# Patient Record
Sex: Female | Born: 1980 | Race: White | Hispanic: No | Marital: Married | Smoking: Current some day smoker
Health system: Southern US, Community
[De-identification: ages and names within clinical notes are randomized; demographics above are authoritative.]

## PROBLEM LIST (undated history)

## (undated) DIAGNOSIS — R1115 Cyclical vomiting syndrome unrelated to migraine: Secondary | ICD-10-CM

## (undated) DIAGNOSIS — K2270 Barrett's esophagus without dysplasia: Secondary | ICD-10-CM

## (undated) DIAGNOSIS — E669 Obesity, unspecified: Secondary | ICD-10-CM

## (undated) DIAGNOSIS — K3184 Gastroparesis: Secondary | ICD-10-CM

## (undated) DIAGNOSIS — E119 Type 2 diabetes mellitus without complications: Secondary | ICD-10-CM

## (undated) DIAGNOSIS — G43909 Migraine, unspecified, not intractable, without status migrainosus: Secondary | ICD-10-CM

## (undated) DIAGNOSIS — Z22322 Carrier or suspected carrier of Methicillin resistant Staphylococcus aureus: Secondary | ICD-10-CM

## (undated) HISTORY — PX: CARDIAC CATHETERIZATION: SHX172

---

## 2003-07-06 DIAGNOSIS — H6990 Unspecified Eustachian tube disorder, unspecified ear: Secondary | ICD-10-CM | POA: Insufficient documentation

## 2005-11-30 ENCOUNTER — Emergency Department: Payer: Self-pay | Admitting: Unknown Physician Specialty

## 2006-01-12 ENCOUNTER — Observation Stay: Payer: Self-pay

## 2006-05-09 ENCOUNTER — Observation Stay: Payer: Self-pay | Admitting: Obstetrics & Gynecology

## 2006-05-10 ENCOUNTER — Observation Stay: Payer: Self-pay | Admitting: Obstetrics & Gynecology

## 2006-07-17 ENCOUNTER — Other Ambulatory Visit: Payer: Self-pay

## 2006-07-17 ENCOUNTER — Emergency Department: Payer: Self-pay | Admitting: Emergency Medicine

## 2010-08-01 ENCOUNTER — Inpatient Hospital Stay: Payer: Self-pay | Admitting: Internal Medicine

## 2010-08-01 ENCOUNTER — Ambulatory Visit: Payer: Self-pay | Admitting: Family Medicine

## 2010-08-04 LAB — PATHOLOGY REPORT

## 2011-01-03 ENCOUNTER — Ambulatory Visit: Payer: Self-pay | Admitting: Internal Medicine

## 2011-01-09 ENCOUNTER — Ambulatory Visit: Payer: Self-pay | Admitting: Internal Medicine

## 2011-08-23 ENCOUNTER — Emergency Department: Payer: Self-pay | Admitting: Emergency Medicine

## 2011-08-26 ENCOUNTER — Emergency Department: Payer: Self-pay | Admitting: Internal Medicine

## 2014-03-20 ENCOUNTER — Ambulatory Visit: Payer: Self-pay | Admitting: Physician Assistant

## 2014-06-27 ENCOUNTER — Ambulatory Visit: Payer: Self-pay | Admitting: Physician Assistant

## 2014-07-04 ENCOUNTER — Ambulatory Visit: Payer: Self-pay | Admitting: Physician Assistant

## 2015-05-19 ENCOUNTER — Ambulatory Visit
Admission: EM | Admit: 2015-05-19 | Discharge: 2015-05-19 | Disposition: A | Discharge status: Home / Self Care | Payer: Medicaid Other | Attending: Emergency Medicine | Admitting: Emergency Medicine

## 2015-05-19 DIAGNOSIS — L0201 Cutaneous abscess of face: Secondary | ICD-10-CM | POA: Diagnosis not present

## 2015-05-19 DIAGNOSIS — L03211 Cellulitis of face: Secondary | ICD-10-CM

## 2015-05-19 HISTORY — DX: Carrier or suspected carrier of methicillin resistant Staphylococcus aureus: Z22.322

## 2015-05-19 HISTORY — DX: Barrett's esophagus without dysplasia: K22.70

## 2015-05-19 HISTORY — DX: Migraine, unspecified, not intractable, without status migrainosus: G43.909

## 2015-05-19 MED ORDER — TRAMADOL HCL 50 MG PO TABS: ORAL_TABLET | ORAL | Status: DC

## 2015-05-19 MED ORDER — SULFAMETHOXAZOLE-TRIMETHOPRIM 800-160 MG PO TABS: 2.0000 | ORAL_TABLET | Freq: Two times a day (BID) | ORAL | Status: DC

## 2015-05-19 NOTE — ED Notes (Signed)
Patient to nurses station c/o length of time waiting to be seen. States has to leave to take son to football. Left prior to being evaluated.

## 2015-05-19 NOTE — Discharge Instructions (Signed)
Follow-up with [facility] within the next day or 2. Warm compresses, Tylenol, tramadol as needed for severe pain. Go to the ER for trouble talking, if he cannot swallowing her saliva, difficulty breathing.

## 2015-05-19 NOTE — ED Notes (Signed)
States squeezed a pimple on chin yesterday and today + swelling and painful to touch

## 2015-05-19 NOTE — ED Provider Notes (Signed)
HPI  SUBJECTIVE:  Karen Ruiz is a 34 y.o. female who presents with a painful erythematous mass of gradually increasing size inferior to her right chin starting 4 days ago. Symptoms worse with palpation no alleviating factors. She tried hot compresses this morning. She states it started off as a "pimple". States that she has not been squeezing it. No  vomiting, fevers, drainage, voice changes, difficulty swallowing, dental pain. No drooling. No antipyretic in the past 4-6 hours. LMP 7/16, patient denies any possibility of being pregnant, has a Mirena. Past medical history of MRSA, Barrett's esophagus, migraines, Gestational diabetes, resolved. no history of hypertension.  Past Medical History  Diagnosis Date  . Barrett esophagus   . Migraines   . MRSA (methicillin resistant staph aureus) culture positive     2009    History reviewed. No pertinent past surgical history.  Family History  Problem Relation Age of Onset  . Diabetes Mother   . Cancer Mother   . Heart failure Mother   . Hypertension Mother   . Heart attack Father   . Diabetes Father   . Hypertension Father     Social History  Substance Use Topics  . Smoking status: Current Every Day Smoker -- 0.50 packs/day    Types: Cigarettes  . Smokeless tobacco: None  . Alcohol Use: Yes     Comment: socially    No current facility-administered medications for this encounter.  Current outpatient prescriptions:  .  ranitidine (ZANTAC) 150 MG capsule, Take 150 mg by mouth 2 (two) times daily., Disp: , Rfl:  .  sulfamethoxazole-trimethoprim (BACTRIM DS,SEPTRA DS) 800-160 MG per tablet, Take 2 tablets by mouth 2 (two) times daily., Disp: 40 tablet, Rfl: 0 .  traMADol (ULTRAM) 50 MG tablet, 1-2 tabs po q 6 hr prn pain Maximum dose= 8 tablets per day, Disp: 20 tablet, Rfl: 0  No Known Allergies   ROS  As noted in HPI.   Physical Exam  BP 155/74 mmHg  Pulse 76  Temp(Src) 98.4 F (36.9 C) (Oral)  Resp 18  Ht 5'  7" (1.702 m)  Wt 295 lb (133.811 kg)  BMI 46.19 kg/m2  SpO2 98%  LMP 04/16/2015 (Exact Date)  Constitutional: Well developed, well nourished, no acute distress Eyes:  EOMI, conjunctiva normal bilaterally HENT: Normocephalic, atraumatic,mucus membranes moist. Dentition normal. No gingival swelling, fluctuance. Voice normal. No trismus, drooling. Respiratory: Normal inspiratory effort Cardiovascular: Normal rate GI: nondistended skin: 3 x 4 cm tender, erythematous mass right submandibular area.  no appreciable fluctuance. Lymph: No other cervical lymphadenopathy. Musculoskeletal: no deformities Neurologic: Alert & oriented x 3, no focal neuro deficits Psychiatric: Speech and behavior appropriate   ED Course   Medications - No data to display  Orders Placed This Encounter  Procedures  . Ambulatory referral to General Surgery    Referral Priority:  Urgent    Referral Type:  Surgical    Referral Reason:  Specialty Services Required    Requested Specialty:  General Surgery    Number of Visits Requested:  1    No results found for this or any previous visit (from the past 24 hour(s)). No results found.  ED Clinical Impression  Abscess or cellulitis of face  ED Assessment/Plan  4 x 3 cm tender area of induration inferior to right chin there is no appreciable fluctuance. No evidence of airway compromise or ludwig's angina, parapharyngeal space abscess. appears to be an early abscess but due to location will not attempt I&D here in  the clinic. Bactrim, Tylenol, tramadol, warm compresses, referral to surgery in 1-2 days.  Discussed  MDM, plan and followup with patient. Discussed sn/sx that should prompt return to the UC or ED. Patient agrees with plan.   *This clinic note was created using Dragon dictation software. Therefore, there may be occasional mistakes despite careful proofreading.  ?   Domenick Gong, MD 05/19/15 2141

## 2015-05-20 ENCOUNTER — Telehealth: Payer: Self-pay | Admitting: Emergency Medicine

## 2015-05-20 NOTE — ED Notes (Signed)
Spoke to patient on the phone and patient states that she has an appointment with [facility] on 05/24/15.  Patient was instructed that if her symptoms worsen over the weekend to go to the ED for further treatment.  Patient verbalized understanding.

## 2015-05-23 ENCOUNTER — Telehealth: Payer: Self-pay

## 2015-05-23 NOTE — Telephone Encounter (Signed)
In preparing for tomorrow's clinic, see that patient is being seen for Paraesophageal Abscess. After speaking with our surgeon here today, patient needs to be seen by ENT.  Called patient and explained this situation. Pt understands. Offered to give local ENT's names and numbers but pt states that she will call back to Urgent Care to receive name and number of a close ENT.

## 2015-05-24 ENCOUNTER — Ambulatory Visit: Payer: Self-pay | Admitting: Surgery

## 2016-09-06 DIAGNOSIS — E1165 Type 2 diabetes mellitus with hyperglycemia: Secondary | ICD-10-CM | POA: Diagnosis present

## 2016-09-06 DIAGNOSIS — F17200 Nicotine dependence, unspecified, uncomplicated: Secondary | ICD-10-CM | POA: Insufficient documentation

## 2016-09-06 DIAGNOSIS — Z720 Tobacco use: Secondary | ICD-10-CM | POA: Insufficient documentation

## 2016-09-06 DIAGNOSIS — IMO0002 Reserved for concepts with insufficient information to code with codable children: Secondary | ICD-10-CM | POA: Insufficient documentation

## 2016-09-06 DIAGNOSIS — E119 Type 2 diabetes mellitus without complications: Secondary | ICD-10-CM

## 2016-09-06 HISTORY — DX: Type 2 diabetes mellitus without complications: E11.9

## 2019-03-05 DIAGNOSIS — L300 Nummular dermatitis: Secondary | ICD-10-CM | POA: Insufficient documentation

## 2019-03-05 DIAGNOSIS — R609 Edema, unspecified: Secondary | ICD-10-CM | POA: Insufficient documentation

## 2019-07-15 ENCOUNTER — Encounter: Payer: Self-pay | Admitting: Emergency Medicine

## 2019-07-15 ENCOUNTER — Emergency Department
Admission: EM | Admit: 2019-07-15 | Discharge: 2019-07-15 | Disposition: A | Discharge status: Home / Self Care | Payer: Medicaid Other | Source: Home / Self Care | Attending: Emergency Medicine | Admitting: Emergency Medicine

## 2019-07-15 ENCOUNTER — Emergency Department: Payer: Medicaid Other

## 2019-07-15 ENCOUNTER — Other Ambulatory Visit: Payer: Self-pay

## 2019-07-15 ENCOUNTER — Observation Stay
Admission: EM | Admit: 2019-07-15 | Discharge: 2019-07-16 | Disposition: A | Discharge status: Home / Self Care | Payer: Medicaid Other | Attending: Internal Medicine | Admitting: Internal Medicine

## 2019-07-15 DIAGNOSIS — K760 Fatty (change of) liver, not elsewhere classified: Secondary | ICD-10-CM | POA: Diagnosis not present

## 2019-07-15 DIAGNOSIS — Z794 Long term (current) use of insulin: Secondary | ICD-10-CM | POA: Diagnosis not present

## 2019-07-15 DIAGNOSIS — Z20828 Contact with and (suspected) exposure to other viral communicable diseases: Secondary | ICD-10-CM | POA: Diagnosis not present

## 2019-07-15 DIAGNOSIS — F17210 Nicotine dependence, cigarettes, uncomplicated: Secondary | ICD-10-CM | POA: Insufficient documentation

## 2019-07-15 DIAGNOSIS — Z8614 Personal history of Methicillin resistant Staphylococcus aureus infection: Secondary | ICD-10-CM | POA: Diagnosis not present

## 2019-07-15 DIAGNOSIS — M5127 Other intervertebral disc displacement, lumbosacral region: Secondary | ICD-10-CM | POA: Diagnosis not present

## 2019-07-15 DIAGNOSIS — K429 Umbilical hernia without obstruction or gangrene: Secondary | ICD-10-CM | POA: Insufficient documentation

## 2019-07-15 DIAGNOSIS — K2270 Barrett's esophagus without dysplasia: Secondary | ICD-10-CM | POA: Insufficient documentation

## 2019-07-15 DIAGNOSIS — Z8719 Personal history of other diseases of the digestive system: Secondary | ICD-10-CM | POA: Diagnosis not present

## 2019-07-15 DIAGNOSIS — Z79899 Other long term (current) drug therapy: Secondary | ICD-10-CM | POA: Diagnosis not present

## 2019-07-15 DIAGNOSIS — G43909 Migraine, unspecified, not intractable, without status migrainosus: Secondary | ICD-10-CM | POA: Insufficient documentation

## 2019-07-15 DIAGNOSIS — R1013 Epigastric pain: Secondary | ICD-10-CM | POA: Insufficient documentation

## 2019-07-15 DIAGNOSIS — I10 Essential (primary) hypertension: Secondary | ICD-10-CM | POA: Insufficient documentation

## 2019-07-15 DIAGNOSIS — E119 Type 2 diabetes mellitus without complications: Secondary | ICD-10-CM | POA: Diagnosis not present

## 2019-07-15 DIAGNOSIS — Z23 Encounter for immunization: Secondary | ICD-10-CM | POA: Diagnosis not present

## 2019-07-15 DIAGNOSIS — R112 Nausea with vomiting, unspecified: Principal | ICD-10-CM | POA: Diagnosis present

## 2019-07-15 DIAGNOSIS — R160 Hepatomegaly, not elsewhere classified: Secondary | ICD-10-CM | POA: Diagnosis not present

## 2019-07-15 DIAGNOSIS — K219 Gastro-esophageal reflux disease without esophagitis: Secondary | ICD-10-CM | POA: Diagnosis not present

## 2019-07-15 LAB — COMPREHENSIVE METABOLIC PANEL
ALT: 51 U/L — ABNORMAL HIGH (ref 0–44)
AST: 40 U/L (ref 15–41)
Albumin: 4 g/dL (ref 3.5–5.0)
Alkaline Phosphatase: 66 U/L (ref 38–126)
Anion gap: 12 (ref 5–15)
BUN: 15 mg/dL (ref 6–20)
CO2: 19 mmol/L — ABNORMAL LOW (ref 22–32)
Calcium: 9 mg/dL (ref 8.9–10.3)
Chloride: 104 mmol/L (ref 98–111)
Creatinine, Ser: 0.54 mg/dL (ref 0.44–1.00)
GFR calc Af Amer: >60 mL/min (ref >60)
GFR calc non Af Amer: >60 mL/min (ref >60)
Glucose, Bld: 332 mg/dL — ABNORMAL HIGH (ref 70–99)
Potassium: 4.1 mmol/L (ref 3.5–5.1)
Sodium: 135 mmol/L (ref 135–145)
Total Bilirubin: 0.6 mg/dL (ref 0.3–1.2)
Total Protein: 7.5 g/dL (ref 6.5–8.1)

## 2019-07-15 LAB — URINALYSIS, COMPLETE (UACMP) WITH MICROSCOPIC
Bacteria, UA: NONE SEEN
Bilirubin Urine: NEGATIVE
Glucose, UA: >=500 mg/dL — AB
Hgb urine dipstick: NEGATIVE
Ketones, ur: 5 mg/dL — AB
Leukocytes,Ua: NEGATIVE
Nitrite: NEGATIVE
Protein, ur: NEGATIVE mg/dL
Specific Gravity, Urine: 1.027 (ref 1.005–1.030)
pH: 7 (ref 5.0–8.0)

## 2019-07-15 LAB — CBC
HCT: 45.9 % (ref 36.0–46.0)
Hemoglobin: 15.7 g/dL — ABNORMAL HIGH (ref 12.0–15.0)
MCH: 29.6 pg (ref 26.0–34.0)
MCHC: 34.2 g/dL (ref 30.0–36.0)
MCV: 86.6 fL (ref 80.0–100.0)
Platelets: 360 10*3/uL (ref 150–400)
RBC: 5.3 MIL/uL — ABNORMAL HIGH (ref 3.87–5.11)
RDW: 13.8 % (ref 11.5–15.5)
WBC: 11.5 10*3/uL — ABNORMAL HIGH (ref 4.0–10.5)
nRBC: 0 % (ref 0.0–0.2)

## 2019-07-15 LAB — GLUCOSE, CAPILLARY: Glucose-Capillary: 221 mg/dL — ABNORMAL HIGH (ref 70–99)

## 2019-07-15 LAB — LIPASE, BLOOD: Lipase: 22 U/L (ref 11–51)

## 2019-07-15 LAB — HEMOGLOBIN A1C
Hgb A1c MFr Bld: 10 % — ABNORMAL HIGH (ref 4.8–5.6)
Mean Plasma Glucose: 240.3 mg/dL

## 2019-07-15 LAB — POCT PREGNANCY, URINE: Preg Test, Ur: NEGATIVE

## 2019-07-15 MED ORDER — ONDANSETRON HCL 4 MG/2ML IJ SOLN
4.0000 mg | Freq: Once | INTRAMUSCULAR | Status: AC
  Administered 2019-07-15: 11:00:00 4 mg via INTRAVENOUS
  Filled 2019-07-15: qty 2

## 2019-07-15 MED ORDER — METOCLOPRAMIDE HCL 5 MG/ML IJ SOLN
10.0000 mg | Freq: Once | INTRAMUSCULAR | Status: AC
  Administered 2019-07-15: 10 mg via INTRAVENOUS
  Filled 2019-07-15: qty 2

## 2019-07-15 MED ORDER — SODIUM CHLORIDE 0.9% FLUSH: 3.0000 mL | Freq: Once | INTRAVENOUS | Status: DC

## 2019-07-15 MED ORDER — PROMETHAZINE HCL 25 MG PO TABS: 12.5000 mg | ORAL_TABLET | Freq: Four times a day (QID) | ORAL | Status: DC | PRN

## 2019-07-15 MED ORDER — INSULIN ASPART 100 UNIT/ML ~~LOC~~ SOLN
0.0000 [IU] | Freq: Three times a day (TID) | SUBCUTANEOUS | Status: DC
  Administered 2019-07-16: 2 [IU] via SUBCUTANEOUS
  Administered 2019-07-16: 3 [IU] via SUBCUTANEOUS
  Filled 2019-07-15 (×2): qty 1

## 2019-07-15 MED ORDER — PROMETHAZINE HCL 25 MG/ML IJ SOLN: 12.5000 mg | Freq: Four times a day (QID) | INTRAMUSCULAR | Status: DC | PRN

## 2019-07-15 MED ORDER — ONDANSETRON HCL 4 MG/2ML IJ SOLN: 4.0000 mg | Freq: Four times a day (QID) | INTRAMUSCULAR | Status: DC | PRN

## 2019-07-15 MED ORDER — SENNOSIDES-DOCUSATE SODIUM 8.6-50 MG PO TABS: 1.0000 | ORAL_TABLET | Freq: Every evening | ORAL | Status: DC | PRN

## 2019-07-15 MED ORDER — ACETAMINOPHEN 650 MG RE SUPP: 650.0000 mg | Freq: Four times a day (QID) | RECTAL | Status: DC | PRN

## 2019-07-15 MED ORDER — MORPHINE SULFATE (PF) 4 MG/ML IV SOLN: 4.0000 mg | INTRAVENOUS | Status: DC | PRN

## 2019-07-15 MED ORDER — ONDANSETRON HCL 4 MG PO TABS: 4.0000 mg | ORAL_TABLET | Freq: Four times a day (QID) | ORAL | Status: DC | PRN

## 2019-07-15 MED ORDER — METOPROLOL TARTRATE 5 MG/5ML IV SOLN: 2.5000 mg | INTRAVENOUS | Status: DC | PRN

## 2019-07-15 MED ORDER — PROMETHAZINE HCL 25 MG/ML IJ SOLN
12.5000 mg | Freq: Once | INTRAMUSCULAR | Status: AC
  Administered 2019-07-15: 14:00:00 12.5 mg via INTRAVENOUS
  Filled 2019-07-15: qty 1

## 2019-07-15 MED ORDER — IOHEXOL 300 MG/ML  SOLN
100.0000 mL | Freq: Once | INTRAMUSCULAR | Status: AC | PRN
  Administered 2019-07-15: 13:00:00 100 mL via INTRAVENOUS

## 2019-07-15 MED ORDER — SODIUM CHLORIDE 0.9 % IV SOLN: Freq: Once | INTRAVENOUS | Status: DC

## 2019-07-15 MED ORDER — INSULIN ASPART 100 UNIT/ML ~~LOC~~ SOLN
0.0000 [IU] | Freq: Every day | SUBCUTANEOUS | Status: DC
  Administered 2019-07-15: 2 [IU] via SUBCUTANEOUS
  Filled 2019-07-15: qty 1

## 2019-07-15 MED ORDER — DROPERIDOL 2.5 MG/ML IJ SOLN
2.5000 mg | Freq: Once | INTRAMUSCULAR | Status: AC
  Administered 2019-07-15: 12:00:00 2.5 mg via INTRAVENOUS
  Filled 2019-07-15: qty 2

## 2019-07-15 MED ORDER — ACETAMINOPHEN 325 MG PO TABS
650.0000 mg | ORAL_TABLET | Freq: Four times a day (QID) | ORAL | Status: DC | PRN
  Administered 2019-07-16 (×2): 650 mg via ORAL
  Filled 2019-07-15 (×2): qty 2

## 2019-07-15 MED ORDER — FENTANYL CITRATE (PF) 100 MCG/2ML IJ SOLN
50.0000 ug | INTRAMUSCULAR | Status: AC | PRN
  Administered 2019-07-15 (×2): 50 ug via INTRAVENOUS
  Filled 2019-07-15 (×2): qty 2

## 2019-07-15 MED ORDER — NICOTINE 14 MG/24HR TD PT24
14.0000 mg | MEDICATED_PATCH | Freq: Every day | TRANSDERMAL | Status: DC
  Filled 2019-07-15: qty 1

## 2019-07-15 MED ORDER — HYDROCODONE-ACETAMINOPHEN 5-325 MG PO TABS: 1.0000 | ORAL_TABLET | ORAL | Status: DC | PRN

## 2019-07-15 MED ORDER — HYDROMORPHONE HCL 1 MG/ML IJ SOLN
1.0000 mg | Freq: Once | INTRAMUSCULAR | Status: AC
  Administered 2019-07-15: 14:00:00 1 mg via INTRAVENOUS
  Filled 2019-07-15: qty 1

## 2019-07-15 MED ORDER — SODIUM CHLORIDE 0.9 % IV BOLUS
1000.0000 mL | Freq: Once | INTRAVENOUS | Status: AC
  Administered 2019-07-15: 11:00:00 1000 mL via INTRAVENOUS

## 2019-07-15 MED ORDER — INFLUENZA VAC SPLIT QUAD 0.5 ML IM SUSY
0.5000 mL | PREFILLED_SYRINGE | INTRAMUSCULAR | Status: AC
  Administered 2019-07-16: 0.5 mL via INTRAMUSCULAR
  Filled 2019-07-15: qty 0.5

## 2019-07-15 MED ORDER — LACTATED RINGERS IV BOLUS: 1000.0000 mL | Freq: Once | INTRAVENOUS | Status: DC

## 2019-07-15 MED ORDER — PROMETHAZINE HCL 12.5 MG PO TABS: 12.5000 mg | ORAL_TABLET | Freq: Four times a day (QID) | ORAL | 0 refills | Status: DC | PRN

## 2019-07-15 MED ORDER — AMLODIPINE BESYLATE 5 MG PO TABS
5.0000 mg | ORAL_TABLET | Freq: Every day | ORAL | Status: DC
  Administered 2019-07-15 – 2019-07-16 (×2): 5 mg via ORAL
  Filled 2019-07-15 (×2): qty 1

## 2019-07-15 MED ORDER — SODIUM CHLORIDE 0.9 % IV SOLN
INTRAVENOUS | Status: DC
  Administered 2019-07-15: 22:00:00 via INTRAVENOUS

## 2019-07-15 MED ORDER — ENOXAPARIN SODIUM 40 MG/0.4ML ~~LOC~~ SOLN
40.0000 mg | SUBCUTANEOUS | Status: DC
  Administered 2019-07-15: 40 mg via SUBCUTANEOUS
  Filled 2019-07-15: qty 0.4

## 2019-07-15 MED ORDER — ALBUTEROL SULFATE (2.5 MG/3ML) 0.083% IN NEBU: 2.5000 mg | INHALATION_SOLUTION | RESPIRATORY_TRACT | Status: DC | PRN

## 2019-07-15 NOTE — ED Notes (Addendum)
Pt offered Zofran, states it does not work for her.  Pt states that she gets phenergan and dilaudid.

## 2019-07-15 NOTE — H&P (Signed)
[facility] [place] [facility]   PATIENT NAME: Karen Ruiz    MR#:  [id number]  DATE OF BIRTH:  05/17/1981  DATE OF ADMISSION:  07/15/2019  PRIMARY CARE PHYSICIAN: Marina Goodell, MD   REQUESTING/REFERRING PHYSICIAN: Dr. Roxan Hockey.  CHIEF COMPLAINT:   Chief Complaint  Patient presents with  . Emesis  . Abdominal Pain   Intractable abdominal pain, nausea and vomiting today. HISTORY OF PRESENT ILLNESS:  Karen Ruiz  is a 38 y.o. female with a known history of hypertension, diabetes type 2 and morbid obesity.  The patient has had a nausea and vomiting for the past 3 to 4 weeks.  She started to have abdominal pain, nausea and vomiting today.  She came to the ED and was given Phenergan prescription and sent home.  She came back due to intractable abdominal pain associated with nausea and vomiting.  Abdominal pain is a cramp, intermittent 7 out of 10 without radiation.  She denies any other symptoms.  CT of abdomen and pelvis is unremarkable.  Dr. Roxan Hockey requested admission. PAST MEDICAL HISTORY:   Past Medical History:  Diagnosis Date  . Barrett esophagus   . Migraines   . MRSA (methicillin resistant staph aureus) culture positive    2009    PAST SURGICAL HISTORY:  History reviewed. No pertinent surgical history.  SOCIAL HISTORY:   Social History   Tobacco Use  . Smoking status: Current Every Day Smoker    Packs/day: 0.50    Types: Cigarettes  . Smokeless tobacco: Never Used  Substance Use Topics  . Alcohol use: Yes    Comment: socially    FAMILY HISTORY:   Family History  Problem Relation Age of Onset  . Diabetes Mother   . Cancer Mother   . Heart failure Mother   . Hypertension Mother   . Heart attack Father   . Diabetes Father   . Hypertension Father     DRUG ALLERGIES:  No Known Allergies  REVIEW OF SYSTEMS:   Review of Systems  Constitutional: Negative for chills, fever and malaise/fatigue.  HENT:  Negative for sore throat.   Eyes: Negative for blurred vision and double vision.  Respiratory: Negative for cough, hemoptysis, shortness of breath, wheezing and stridor.   Cardiovascular: Negative for chest pain, palpitations, orthopnea and leg swelling.  Gastrointestinal: Positive for abdominal pain, nausea and vomiting. Negative for blood in stool, diarrhea and melena.  Genitourinary: Negative for dysuria, flank pain and hematuria.  Musculoskeletal: Negative for back pain and joint pain.  Skin: Negative for rash.  Neurological: Negative for dizziness, sensory change, focal weakness, seizures, loss of consciousness, weakness and headaches.  Endo/Heme/Allergies: Negative for polydipsia.  Psychiatric/Behavioral: Negative for depression. The patient is not nervous/anxious.     MEDICATIONS AT HOME:   Prior to Admission medications   Medication Sig Start Date End Date Taking? Authorizing Provider  promethazine (PHENERGAN) 12.5 MG tablet Take 1 tablet (12.5 mg total) by mouth every 6 (six) hours as needed for nausea or vomiting. 07/15/19   Chesley Noon, MD      VITAL SIGNS:  Blood pressure (!) 173/86, pulse 74, temperature 98.2 F (36.8 C), temperature source Oral, resp. rate 20, height 5\' 7"  (1.702 m), weight 113.4 kg, last menstrual period 07/14/2019, SpO2 97 %.  PHYSICAL EXAMINATION:  Physical Exam  GENERAL:  38 y.o.-year-old patient lying in the bed with no acute distress.  Morbid obesity. EYES: Pupils equal, round, reactive to light and accommodation. No scleral icterus.  Extraocular muscles intact.  HEENT: Head atraumatic, normocephalic. NECK:  Supple, no jugular venous distention. No thyroid enlargement, no tenderness.  LUNGS: Normal breath sounds bilaterally, no wheezing, rales,rhonchi or crepitation. No use of accessory muscles of respiration.  CARDIOVASCULAR: S1, S2 normal. No murmurs, rubs, or gallops.  ABDOMEN: Soft, tenderness in epigastric area, nondistended. Bowel sounds  present. No organomegaly or mass.  EXTREMITIES: No pedal edema, cyanosis, or clubbing.  NEUROLOGIC: Cranial nerves II through XII are intact. Muscle strength 5/5 in all extremities. Sensation intact. Gait not checked.  PSYCHIATRIC: The patient is alert and oriented x 3.  SKIN: No obvious rash, lesion, or ulcer.   LABORATORY PANEL:   CBC Recent Labs  Lab 07/15/19 1042  WBC 11.5*  HGB 15.7*  HCT 45.9  PLT 360   ------------------------------------------------------------------------------------------------------------------  Chemistries  Recent Labs  Lab 07/15/19 1106  NA 135  K 4.1  CL 104  CO2 19*  GLUCOSE 332*  BUN 15  CREATININE 0.54  CALCIUM 9.0  AST 40  ALT 51*  ALKPHOS 66  BILITOT 0.6   ------------------------------------------------------------------------------------------------------------------  Cardiac Enzymes No results for input(s): TROPONINI in the last 168 hours. ------------------------------------------------------------------------------------------------------------------  RADIOLOGY:  Ct Abdomen Pelvis W Contrast  Result Date: 07/15/2019 CLINICAL DATA:  Abdominal pain and vomiting. EXAM: CT ABDOMEN AND PELVIS WITH CONTRAST TECHNIQUE: Multidetector CT imaging of the abdomen and pelvis was performed using the standard protocol following bolus administration of intravenous contrast. CONTRAST:  100mL OMNIPAQUE IOHEXOL 300 MG/ML  SOLN COMPARISON:  CT abdomen pelvis dated August 01, 2010. FINDINGS: Lower chest: No acute abnormality. Unchanged small pericardial cyst at the right cardiophrenic angle. Hepatobiliary: Unchanged diffuse hepatic steatosis and mild hepatomegaly. No focal liver abnormality. The gallbladder is unremarkable. No biliary dilatation. Pancreas: Unremarkable. No pancreatic ductal dilatation or surrounding inflammatory changes. Spleen: Normal in size without focal abnormality. Adrenals/Urinary Tract: The adrenal glands are unremarkable.  Tiny subcentimeter low-density lesion in the upper pole of the right kidney is too small to characterize. No renal calculi or hydronephrosis. The bladder is unremarkable. Stomach/Bowel: Stomach is within normal limits. Appendix appears normal. No evidence of bowel wall thickening, distention, or inflammatory changes. Vascular/Lymphatic: No significant vascular findings are present. No enlarged abdominal or pelvic lymph nodes. Reproductive: Uterus and bilateral adnexa are unremarkable. Tampon in the vagina. Other: Small fat containing umbilical hernia. No free fluid or pneumoperitoneum. Musculoskeletal: No acute or significant osseous findings. Lumbarization of S1. Severe disc height loss at L5-S1 with large partially calcified disc protrusion. IMPRESSION: 1.  No acute intra-abdominal process. 2. Unchanged mild hepatomegaly with steatosis. Electronically Signed   By: Obie DredgeWilliam T Derry M.D.   On: 07/15/2019 13:46      IMPRESSION AND PLAN:   Intractable abdominal pain, nausea, vomiting, possible due to gastroparesis or acute gastritis. The patient will be placed for observation. N.p.o. except medication, Zofran as needed, IV fluid support.  Hyperglycemia and diabetes.  Start sliding scale. Hypertension.  Start Norvasc, Lopressor IV PRN. Morbid obesity.  Diet control is advised, follow-up PCP. Tobacco abuse.  Smoking cessation was counseled for 3 to 4 minutes, nicotine patch.  All the records are reviewed and case discussed with ED provider. Management plans discussed with the patient, family and they are in agreement.  CODE STATUS: Full code.  TOTAL TIME TAKING CARE OF THIS PATIENT: 45 minutes.    Shaune PollackQing Lee Kuang M.D on 07/15/2019 at 7:45 PM  Between 7am to 6pm - Pager - [phone]  After 6pm go to [email or web address] - password [email or web address]  [facility]  Office  [phone]  CC: Primary care physician; Sofie Hartigan, MD   Note: This dictation was prepared with  Dragon dictation along with smaller phrase technology. Any transcriptional errors that result from this process are unin

## 2019-07-15 NOTE — ED Notes (Signed)
Pt ambulatory/steady to room.

## 2019-07-15 NOTE — ED Provider Notes (Signed)
[facility] Emergency Department Provider Note    First MD Initiated Contact with Patient 07/15/19 1936     (approximate)  I have reviewed the triage vital signs and the nursing notes.   HISTORY  Chief Complaint Emesis and Abdominal Pain    HPI Karen Ruiz is a 38 y.o. female seen here earlier today for nausea vomiting presents the ER several hours after being discharged with recurrent abdominal pain nausea vomiting.  Was unable to get her medications filled.  No change in her symptoms.  Unable to keep any fluids down.   Past Medical History:  Diagnosis Date  . Barrett esophagus   . Migraines   . MRSA (methicillin resistant staph aureus) culture positive    2009   Family History  Problem Relation Age of Onset  . Diabetes Mother   . Cancer Mother   . Heart failure Mother   . Hypertension Mother   . Heart attack Father   . Diabetes Father   . Hypertension Father    History reviewed. No pertinent surgical history. There are no active problems to display for this patient.     Prior to Admission medications   Medication Sig Start Date End Date Taking? Authorizing Provider  promethazine (PHENERGAN) 12.5 MG tablet Take 1 tablet (12.5 mg total) by mouth every 6 (six) hours as needed for nausea or vomiting. 07/15/19   Chesley Noon, MD    Allergies Patient has no known allergies.    Social History Social History   Tobacco Use  . Smoking status: Current Every Day Smoker    Packs/day: 0.50    Types: Cigarettes  . Smokeless tobacco: Never Used  Substance Use Topics  . Alcohol use: Yes    Comment: socially  . Drug use: Not on file    Review of Systems Patient denies headaches, rhinorrhea, blurry vision, numbness, shortness of breath, chest pain, edema, cough, abdominal pain, nausea, vomiting, diarrhea, dysuria, fevers, rashes or hallucinations unless otherwise stated above in HPI. ____________________________________________    PHYSICAL EXAM:  VITAL SIGNS: Vitals:   07/15/19 1810  BP: (!) 173/86  Pulse: 74  Resp: 20  Temp: 98.2 F (36.8 C)  SpO2: 97%    Constitutional: Alert and oriented. Well appearing and in no acute distress. Eyes: Conjunctivae are normal.  Head: Atraumatic. Nose: No congestion/rhinnorhea. Mouth/Throat: Mucous membranes are moist.   Neck: Painless ROM.  Cardiovascular:   Good peripheral circulation. Respiratory: Normal respiratory effort.  No retractions.  Gastrointestinal: obese, soft and nontender.  Musculoskeletal: No lower extremity tenderness .  No joint effusions. Neurologic:  Normal speech and language. No gross focal neurologic deficits are appreciated.  Skin:  Skin is warm, dry and intact. No rash noted. Psychiatric: Mood and affect are normal. Speech and behavior are normal.  ____________________________________________   LABS (all labs ordered are listed, but only abnormal results are displayed)  Results for orders placed or performed during the hospital encounter of 07/15/19 (from the past 24 hour(s))  CBC     Status: Abnormal   Collection Time: 07/15/19 10:42 AM  Result Value Ref Range   WBC 11.5 (H) 4.0 - 10.5 K/uL   RBC 5.30 (H) 3.87 - 5.11 MIL/uL   Hemoglobin 15.7 (H) 12.0 - 15.0 g/dL   HCT 29.7 98.9 - 21.1 %   MCV 86.6 80.0 - 100.0 fL   MCH 29.6 26.0 - 34.0 pg   MCHC 34.2 30.0 - 36.0 g/dL   RDW 94.1 74.0 - 81.4 %  Platelets 360 150 - 400 K/uL   nRBC 0.0 0.0 - 0.2 %  Urinalysis, Complete w Microscopic     Status: Abnormal   Collection Time: 07/15/19 11:06 AM  Result Value Ref Range   Color, Urine YELLOW (A) YELLOW   APPearance CLEAR (A) CLEAR   Specific Gravity, Urine 1.027 1.005 - 1.030   pH 7.0 5.0 - 8.0   Glucose, UA >=500 (A) NEGATIVE mg/dL   Hgb urine dipstick NEGATIVE NEGATIVE   Bilirubin Urine NEGATIVE NEGATIVE   Ketones, ur 5 (A) NEGATIVE mg/dL   Protein, ur NEGATIVE NEGATIVE mg/dL   Nitrite NEGATIVE NEGATIVE   Leukocytes,Ua  NEGATIVE NEGATIVE   WBC, UA 0-5 0 - 5 WBC/hpf   Bacteria, UA NONE SEEN NONE SEEN   Squamous Epithelial / LPF 0-5 0 - 5   Mucus PRESENT   Comprehensive metabolic panel     Status: Abnormal   Collection Time: 07/15/19 11:06 AM  Result Value Ref Range   Sodium 135 135 - 145 mmol/L   Potassium 4.1 3.5 - 5.1 mmol/L   Chloride 104 98 - 111 mmol/L   CO2 19 (L) 22 - 32 mmol/L   Glucose, Bld 332 (H) 70 - 99 mg/dL   BUN 15 6 - 20 mg/dL   Creatinine, Ser 0.54 0.44 - 1.00 mg/dL   Calcium 9.0 8.9 - 10.3 mg/dL   Total Protein 7.5 6.5 - 8.1 g/dL   Albumin 4.0 3.5 - 5.0 g/dL   AST 40 15 - 41 U/L   ALT 51 (H) 0 - 44 U/L   Alkaline Phosphatase 66 38 - 126 U/L   Total Bilirubin 0.6 0.3 - 1.2 mg/dL   GFR calc non Af Amer >60 >60 mL/min   GFR calc Af Amer >60 >60 mL/min   Anion gap 12 5 - 15  Lipase, blood     Status: None   Collection Time: 07/15/19 11:06 AM  Result Value Ref Range   Lipase 22 11 - 51 U/L  Pregnancy, urine POC     Status: None   Collection Time: 07/15/19 11:15 AM  Result Value Ref Range   Preg Test, Ur NEGATIVE NEGATIVE   ____________________________________________ ________________________________  RADIOLOGY   ____________________________________________   PROCEDURES  Procedure(s) performed:  Procedures    Critical Care performed: no ____________________________________________   INITIAL IMPRESSION / ASSESSMENT AND PLAN / ED COURSE  Pertinent labs & imaging results that were available during my care of the patient were reviewed by me and considered in my medical decision making (see chart for details).  DDX: gastroparesis, dehydration, cyclic vomiting, enteritis  Karen Ruiz is a 38 y.o. who presents to the ED with symptoms as described above. Given concern for gastroparesis with failing outpatient trial now with intractable vomiting will discuss with hospitalist for admission for IV fluids symptomatic management.  Do not feel repeat imaging  clinically indicated       ____________________________________________   FINAL CLINICAL IMPRESSION(S) / ED DIAGNOSES  Final diagnoses:  Intractable nausea and vomiting      NEW MEDICATIONS STARTED DURING THIS VISIT:  New Prescriptions   No medications on file     Note:  This document was prepared using Dragon voice recognition software and may include unintentional dictation errors.     Merlyn Lot, MD 07/15/19 1945

## 2019-07-15 NOTE — ED Notes (Signed)
EDP [place] to bedside.  

## 2019-07-15 NOTE — ED Notes (Signed)
Pt forgot to sign before she left.

## 2019-07-15 NOTE — ED Notes (Signed)
Pt denies change yet in pain or nausea but has stopped moaning and failing around in bed. Pt is alert and relaxed. Calmly laying in bed.

## 2019-07-15 NOTE — ED Triage Notes (Addendum)
Pt states she has Barrett's esophagus, has been vomiting and unable to keep food down since last Friday, hyperventilating and moaning in triage, denies diarrhea.

## 2019-07-15 NOTE — ED Triage Notes (Addendum)
Reports she has Barrett's esophagus and was discharged approx 1600PM today from ER. Pt states pain and nausea medications work while she is here but then when she got home, it began again. Pt was unable to pickup the prescribed phenergan prior to return to ER. Pt dry heaving. Pt alert and oriented X4, cooperative, RR even and unlabored, color WNL. Pt in NAD. Same sx as earlier visit today. Pt prefers to wait for EDP to assess instead of doing repeat labs as pt had labs performed in this ER earlier today.

## 2019-07-15 NOTE — ED Notes (Signed)
Pt changing clothes. PT states she will sign for d/c once changed into personal clothes.

## 2019-07-15 NOTE — ED Notes (Signed)
Urine Preg NEG.  

## 2019-07-15 NOTE — ED Notes (Signed)
Pt continues to unplug herself from vital sign machine. Educated several times to leave cords in place as it is desired to monitor BP/pulse ox/etc.

## 2019-07-15 NOTE — ED Notes (Addendum)
Pt called out using call bell that pain has inc again. Moaning in bed and rocking self forward and backward. [facility] notified. Pt given another warm blanket at requested.

## 2019-07-15 NOTE — ED Notes (Signed)
Pt denies nausea. States crackers and gingerale tolerated well.

## 2019-07-15 NOTE — ED Notes (Signed)
Pt hasn't attempted to eat or drink anything yet. Prompted again and educated again.

## 2019-07-15 NOTE — ED Triage Notes (Signed)
Pt ambulatory to her vehicle in the parking lot, no distress observed.

## 2019-07-15 NOTE — ED Notes (Signed)
So far, pt tolerating crackers and sips of drink. Pt denies nausea/pain currently. Pt on phone with family.

## 2019-07-15 NOTE — ED Provider Notes (Signed)
[facility] Emergency Department Provider Note   ____________________________________________   First MD Initiated Contact with Patient 07/15/19 1013     (approximate)  I have reviewed the triage vital signs and the nursing notes.   HISTORY  Chief Complaint Emesis    HPI Karen Ruiz is a 38 y.o. female with past medical history of Barrett's esophagus and type 2 diabetes presents to the ED complaining of abdominal pain and vomiting.  Patient reports she has had approximately 5 days of persistent nausea with intermittent vomiting.  Emesis is nonbilious and nonbloody and she denies any diarrhea or blood in her stool.  Symptoms have been associated with upper abdominal pain and she reports similar episodes in the past.  She attributes this to Barrett's esophagus, which was found on her prior EGD.  She denies any fevers, chills, dysuria, hematuria, or vaginal discharge.  She does states she is currently on her menses.  She admits to smoking marijuana earlier today, which she states alleviated her symptoms.        Past Medical History:  Diagnosis Date  . Barrett esophagus   . Migraines   . MRSA (methicillin resistant staph aureus) culture positive    2009    There are no active problems to display for this patient.   History reviewed. No pertinent surgical history.  Prior to Admission medications   Medication Sig Start Date End Date Taking? Authorizing Provider  promethazine (PHENERGAN) 12.5 MG tablet Take 1 tablet (12.5 mg total) by mouth every 6 (six) hours as needed for nausea or vomiting. 07/15/19   Blake Divine, MD    Allergies Patient has no known allergies.  Family History  Problem Relation Age of Onset  . Diabetes Mother   . Cancer Mother   . Heart failure Mother   . Hypertension Mother   . Heart attack Father   . Diabetes Father   . Hypertension Father     Social History Social History   Tobacco Use  . Smoking  status: Current Every Day Smoker    Packs/day: 0.50    Types: Cigarettes  . Smokeless tobacco: Never Used  Substance Use Topics  . Alcohol use: Yes    Comment: socially  . Drug use: Not on file    Review of Systems  Constitutional: No fever/chills Eyes: No visual changes. ENT: No sore throat. Cardiovascular: Denies chest pain. Respiratory: Denies shortness of breath. Gastrointestinal: Positive for abdominal pain.  Positive for nausea and vomiting.  No diarrhea.  No constipation. Genitourinary: Negative for dysuria. Musculoskeletal: Negative for back pain. Skin: Negative for rash. Neurological: Negative for headaches, focal weakness or numbness.  ____________________________________________   PHYSICAL EXAM:  VITAL SIGNS: ED Triage Vitals  Enc Vitals Group     BP 07/15/19 1005 114/89     Pulse Rate 07/15/19 1005 67     Resp 07/15/19 1005 20     Temp 07/15/19 1005 98.6 F (37 C)     Temp Source 07/15/19 1005 Oral     SpO2 07/15/19 1005 100 %     Weight 07/15/19 1006 250 lb (113.4 kg)     Height 07/15/19 1006 5\' 7"  (1.702 m)     Head Circumference --      Peak Flow --      Pain Score 07/15/19 1006 9     Pain Loc --      Pain Edu? --      Excl. in [place]? --  Constitutional: Alert and oriented. Eyes: Conjunctivae are normal. Head: Atraumatic. Nose: No congestion/rhinnorhea. Mouth/Throat: Mucous membranes are moist. Neck: Normal ROM Cardiovascular: Normal rate, regular rhythm. Grossly normal heart sounds. Respiratory: Normal respiratory effort.  No retractions. Lungs CTAB. Gastrointestinal: Soft and nontender. No distention. Genitourinary: deferred Musculoskeletal: No lower extremity tenderness nor edema. Neurologic:  Normal speech and language. No gross focal neurologic deficits are appreciated. Skin:  Skin is warm, dry and intact. No rash noted. Psychiatric: Mood and affect are normal. Speech and behavior are normal.   ____________________________________________   LABS (all labs ordered are listed, but only abnormal results are displayed)  Labs Reviewed  CBC - Abnormal; Notable for the following components:      Result Value   WBC 11.5 (*)    RBC 5.30 (*)    Hemoglobin 15.7 (*)    All other components within normal limits  URINALYSIS, COMPLETE (UACMP) WITH MICROSCOPIC - Abnormal; Notable for the following components:   Color, Urine YELLOW (*)    APPearance CLEAR (*)    Glucose, UA >=500 (*)    Ketones, ur 5 (*)    All other components within normal limits  COMPREHENSIVE METABOLIC PANEL - Abnormal; Notable for the following components:   CO2 19 (*)    Glucose, Bld 332 (*)    ALT 51 (*)    All other components within normal limits  LIPASE, BLOOD  POC URINE PREG, ED  POCT PREGNANCY, URINE     PROCEDURES  Procedure(s) performed (including Critical Care):  Procedures   ____________________________________________   INITIAL IMPRESSION / ASSESSMENT AND PLAN / ED COURSE       38 year old female with history of Barrett's esophagus and recurrent episodes of abdominal pain with vomiting presents to the ED complaining of abdominal pain and vomiting over the past 5 days.  She has a benign and nonfocal abdominal exam, given similar episodes in the past, doubt acute surgical pathology and no indication for advanced imaging.  Barrett's esophagus would not be expected to cause vomiting or abdominal pain, given her poorly controlled diabetes, would suspect gastroparesis versus cannabinoid hyperemesis versus cyclic vomiting syndrome.  Will screen labs as well as UA and urine pregnancy.  Urine pregnancy negative and UA without evidence infection.  Labs significant for hyperglycemia without evidence of DKA.  Patient continued to have pain and vomiting requiring droperidol and then Dilaudid with Phenergan.  Given ongoing pain, CT scan was obtained and negative for acute process.  Patient turned over to Dr.  Roxan Hockey pending further symptomatic control, if she is able to tolerate p.o. without difficulty, she would be appropriate for discharge home with PCP follow-up.  Otherwise, she will require admission for intractable nausea and vomiting.      ____________________________________________   FINAL CLINICAL IMPRESSION(S) / ED DIAGNOSES  Final diagnoses:  Non-intractable vomiting with nausea, unspecified vomiting type  Epigastric pain     ED Discharge Orders         Ordered    promethazine (PHENERGAN) 12.5 MG tablet  Every 6 hours PRN     07/15/19 1451           Note:  This document was prepared using Dragon voice recognition software and may include unintentional dictation errors.   Chesley Noon, MD 07/15/19 [phone]

## 2019-07-15 NOTE — ED Notes (Signed)
Pt moaning/groaning and failing around in bed. Urine sample sent to lab. States pain dec immediately post fentanyl but has already inc again. [place] notified.

## 2019-07-15 NOTE — ED Provider Notes (Signed)
Patient tolerating PO.  Stable and appropriate for trial of outpatient management.   Merlyn Lot, MD 07/15/19 [phone]

## 2019-07-15 NOTE — ED Notes (Signed)
Verbal order from Karen Ruiz to PO challenge. Pt offered gingerale and crackers. Prompted to try eating and drinking some. Will check back in with pt soon.

## 2019-07-15 NOTE — Plan of Care (Signed)
Patient has been nauseous and is getting phenergan.  Will continue to monitor.

## 2019-07-15 NOTE — ED Notes (Signed)
Pt leaving for imaging.

## 2019-07-16 LAB — CBC
HCT: 40.4 % (ref 36.0–46.0)
Hemoglobin: 13.7 g/dL (ref 12.0–15.0)
MCH: 30 pg (ref 26.0–34.0)
MCHC: 33.9 g/dL (ref 30.0–36.0)
MCV: 88.6 fL (ref 80.0–100.0)
Platelets: 299 10*3/uL (ref 150–400)
RBC: 4.56 MIL/uL (ref 3.87–5.11)
RDW: 14.1 % (ref 11.5–15.5)
WBC: 9.3 10*3/uL (ref 4.0–10.5)
nRBC: 0 % (ref 0.0–0.2)

## 2019-07-16 LAB — GLUCOSE, CAPILLARY
Glucose-Capillary: 165 mg/dL — ABNORMAL HIGH (ref 70–99)
Glucose-Capillary: 225 mg/dL — ABNORMAL HIGH (ref 70–99)

## 2019-07-16 LAB — BASIC METABOLIC PANEL
Anion gap: 12 (ref 5–15)
BUN: 10 mg/dL (ref 6–20)
CO2: 22 mmol/L (ref 22–32)
Calcium: 8.1 mg/dL — ABNORMAL LOW (ref 8.9–10.3)
Chloride: 105 mmol/L (ref 98–111)
Creatinine, Ser: 0.55 mg/dL (ref 0.44–1.00)
GFR calc Af Amer: >60 mL/min (ref >60)
GFR calc non Af Amer: >60 mL/min (ref >60)
Glucose, Bld: 172 mg/dL — ABNORMAL HIGH (ref 70–99)
Potassium: 3.4 mmol/L — ABNORMAL LOW (ref 3.5–5.1)
Sodium: 139 mmol/L (ref 135–145)

## 2019-07-16 LAB — SARS CORONAVIRUS 2 (TAT 6-24 HRS): SARS Coronavirus 2: NEGATIVE

## 2019-07-16 LAB — HIV ANTIBODY (ROUTINE TESTING W REFLEX): HIV Screen 4th Generation wRfx: NONREACTIVE

## 2019-07-16 MED ORDER — OMEPRAZOLE 20 MG PO CPDR: 20.0000 mg | DELAYED_RELEASE_CAPSULE | Freq: Every day | ORAL | 1 refills | Status: DC

## 2019-07-16 MED ORDER — INSULIN DETEMIR 100 UNIT/ML ~~LOC~~ SOLN: 11.0000 [IU] | Freq: Every day | SUBCUTANEOUS | 11 refills | Status: DC

## 2019-07-16 MED ORDER — METFORMIN HCL 500 MG PO TABS: 500.0000 mg | ORAL_TABLET | Freq: Two times a day (BID) | ORAL | 1 refills | Status: DC

## 2019-07-16 MED ORDER — INSULIN ASPART 100 UNIT/ML ~~LOC~~ SOLN: 0.0000 [IU] | Freq: Three times a day (TID) | SUBCUTANEOUS | 11 refills | Status: AC

## 2019-07-16 MED ORDER — ONDANSETRON 4 MG PO TBDP: 4.0000 mg | ORAL_TABLET | Freq: Three times a day (TID) | ORAL | 0 refills | Status: DC | PRN

## 2019-07-16 MED ORDER — INSULIN ASPART 100 UNIT/ML ~~LOC~~ SOLN: 0.0000 [IU] | Freq: Every day | SUBCUTANEOUS | 11 refills | Status: DC

## 2019-07-16 MED ORDER — AMLODIPINE BESYLATE 5 MG PO TABS: 5.0000 mg | ORAL_TABLET | Freq: Every day | ORAL | 0 refills | Status: DC

## 2019-07-16 MED ORDER — LIVING WELL WITH DIABETES BOOK
Freq: Once | Status: AC
  Administered 2019-07-16: 15:00:00
  Filled 2019-07-16 (×3): qty 1

## 2019-07-16 NOTE — Discharge Summary (Signed)
Karen Ruiz, is a 38 y.o. female  DOB [birth year]  MRN [id number].  Admission date:  07/15/2019  Admitting Physician  Demetrios Loll, MD  Discharge Date:  07/16/2019   Primary MD  Sofie Hartigan, MD  Recommendations for primary care physician for things to follow:   Follow-up with PCP in 1 week    Admission Diagnosis  Intractable nausea and vomiting [R11.2]   Discharge Diagnosis  Intractable nausea and vomiting [R11.2]   Active Problems:   Intractable nausea and vomiting      Past Medical History:  Diagnosis Date   Barrett esophagus    Migraines    MRSA (methicillin resistant staph aureus) culture positive    2009    History reviewed. No pertinent surgical history.     History of present illness and  Hospital Course:     Kindly see H&P for history of present illness and admission details, please review complete Labs, Consult reports and Test reports for all details in brief  HPI  from the history and physical done on the day of admission 38 year old female patient with history of GERD, questionable Barrett's long time back, diabetes mellitus type 2 and not taking any medicines comes in because of intractable nausea, vomiting.   Hospital Course   #1.  Intractable nausea, vomiting, abdominal pain likely due to acute gastritis, patient admitted to observation status, started on IV Zofran, IV hydration, kept n.p.o.  This morning when I saw the patient the nausea and vomiting completely resolved, we going to give clear liquids and advance as tolerated and see if she tolerates normal food she will go home.  Patient has showed told me that she has history of Barrett's long time ago and not bothered her recently and she used to take Zantac over-the-counter.  We are discharging her home with Prilosec,  Zofran. 2.  Diabetes mellitus type 2, hemoglobin A1c is 10.  Unfortunately patient has no insurance and not able to afford to see a primary doctor regarding diabetes medicines.  Patient was taking Levemir, metformin in the past.  Diabetic coordinator saw the patient, discussed about importance of checking blood glucose and maintaining good glucose levels to prevent long-term complications.  [facility].  Discharging her with Levemir 11 units at night, metformin 500 mg p.o. twice daily.  Also provided prescription for nighttime and daytime short-acting insulin with coverage. #3.  Morbid obesity, advised to lose weight.   Discharge Condition: Stable   Follow UP  Follow-up Information    Feldpausch, Chrissie Noa, MD. Schedule an appointment as soon as possible for a visit in 1 week(s).   Specialty: Family Medicine Contact information: [place] [address] [phone]             Discharge Instructions  and  Discharge Medications   Dose: 0-5 Units Freq: Daily at bedtime Route: [place] Start: 07/15/19 2200   Admin Instructions:  Do NOT hold insulin if patient is NPO.   Order specific questions:  Correction coverage: HS scale CBG < 70: Implement Hypoglycemia Standing Orders and refer to Hypoglycemia Standing Orders sidebar report CBG 70 - 120: 0 units CBG 121 - 150: 0 units CBG 151 - 200: 0 units CBG 201 - 250: 2 units CBG 251 - 300: 3 units CBG 301 - 350: 4 units CBG 351 - 400: 5 units CBG > 400 call MD and obtain STAT lab verification   2200  2200    2200    2200    2200    2200    2200     insulin aspart (novoLOG) injection 0-9 Units  Dose: 0-9 Units Freq: 3 times daily with meals Route: [place] Start: 07/16/19 0800   Admin Instructions:  Do NOT hold if patient is NPO. Sensitive Scale.   Order specific questions:  Correction coverage: Sensitive (thin, NPO, renal) CBG < 70: Implement Hypoglycemia  Standing Orders and refer to Hypoglycemia Standing Orders sidebar report CBG 70 - 120: 0 units CBG 121 - 150: 1 unit CBG 151 - 200: 2 units CBG 201 - 250: 3 units CBG 251 - 300: 5 units CBG 301 - 350: 7 units CBG 351 - 400 9 units CBG > 400 call MD and obtain STAT lab verification   0825   1209   1700    0800   1200   1700    0800   1200   1700    0800   1200   1700    0800   1200   1700    0800   1200   1700    0800   1200   1700     living well with diabetes book MISC  Freq: Once Route: XX Start: 07/16/19 1100   1100                        Allergies as of 07/16/2019   No Known Allergies     Medication List    TAKE these medications   amLODipine 5 MG tablet Commonly known as: NORVASC Take 1 tablet (5 mg total) by mouth daily. Start taking on: July 17, 2019   insulin aspart 100 UNIT/ML injection Commonly known as: novoLOG Inject 0-9 Units into the skin 3 (three) times daily with meals.   insulin aspart 100 UNIT/ML injection Commonly known as: novoLOG Inject 0-5 Units into the skin at bedtime.   insulin detemir 100 UNIT/ML injection Commonly known as: Levemir Inject 0.11 mLs (11 Units total) into the skin daily.   metFORMIN 500 MG tablet Commonly known as: Glucophage Take 1 tablet (500 mg total) by mouth 2 (two) times daily with a meal.   omeprazole 20 MG capsule Commonly known as: PriLOSEC Take 1 capsule (20 mg total) by mouth daily.   ondansetron 4 MG disintegrating tablet Commonly known as: Zofran ODT Take 1 tablet (4 mg total) by mouth every 8 (eight) hours as needed for nausea or vomiting.   promethazine 12.5 MG tablet Commonly known as: PHENERGAN Take 1 tablet (12.5 mg total) by mouth every 6 (six) hours as needed for nausea or vomiting.         Diet and Activity recommendation: See Discharge Instructions above   Consults obtained -diabetes coordinator   Major procedures and Radiology Reports -  PLEASE review detailed and final reports for all details, in brief -     Ct Abdomen Pelvis W Contrast  Result Date: 07/15/2019 CLINICAL DATA:  Abdominal pain and vomiting. EXAM: CT ABDOMEN AND PELVIS WITH CONTRAST TECHNIQUE: Multidetector CT imaging of the abdomen and pelvis was performed using the standard protocol following bolus administration of intravenous contrast. CONTRAST:  OMNIPAQUE IOHEXOL 300 MG/ML  SOLN COMPARISON:  CT abdomen pelvis dated August 01, 2010. FINDINGS: Lower chest: No acute abnormality. Unchanged small pericardial cyst at the right cardiophrenic angle. Hepatobiliary: Unchanged diffuse hepatic steatosis and mild hepatomegaly. No focal liver abnormality.  The gallbladder is unremarkable. No biliary dilatation. Pancreas: Unremarkable. No pancreatic ductal dilatation or surrounding inflammatory changes. Spleen: Normal in size without focal abnormality. Adrenals/Urinary Tract: The adrenal glands are unremarkable. Tiny subcentimeter low-density lesion in the upper pole of the right kidney is too small to characterize. No renal calculi or hydronephrosis. The bladder is unremarkable. Stomach/Bowel: Stomach is within normal limits. Appendix appears normal. No evidence of bowel wall thickening, distention, or inflammatory changes. Vascular/Lymphatic: No significant vascular findings are present. No enlarged abdominal or pelvic lymph nodes. Reproductive: Uterus and bilateral adnexa are unremarkable. Tampon in the vagina. Other: Small fat containing umbilical hernia. No free fluid or pneumoperitoneum. Musculoskeletal: No acute or significant osseous findings. Lumbarization of S1. Severe disc height loss at L5-S1 with large partially calcified disc protrusion. IMPRESSION: 1.  No acute intra-abdominal process. 2. Unchanged mild hepatomegaly with steatosis. Electronically Signed   By: Obie DredgeWilliam T Derry M.D.   On: 07/15/2019 13:46    Micro Results    Recent Results (from the past 240  hour(s))  SARS CORONAVIRUS 2 (TAT 6-24 HRS) Nasopharyngeal Nasopharyngeal Swab     Status: None   Collection Time: 07/15/19  8:20 PM   Specimen: Nasopharyngeal Swab  Result Value Ref Range Status   SARS Coronavirus 2 NEGATIVE NEGATIVE Final    Comment: (NOTE) SARS-CoV-2 target nucleic acids are NOT DETECTED. The SARS-CoV-2 RNA is generally detectable in upper and lower respiratory specimens during the acute phase of infection. Negative results do not preclude SARS-CoV-2 infection, do not rule out co-infections with other pathogens, and should not be used as the sole basis for treatment or other patient management decisions. Negative results must be combined with clinical observations, patient history, and epidemiological information. The expected result is Negative. Fact Sheet for Patients: HairSlick.nohttps://www.fda.gov/media/138098/download Fact Sheet for Healthcare Providers: quierodirigir.comhttps://www.fda.gov/media/138095/download This test is not yet approved or cleared by the [place] FDA and  has been authorized for detection and/or diagnosis of SARS-CoV-2 by FDA under an Emergency Use Authorization (EUA). This EUA will remain  in effect (meaning this test can be used) for the duration of the COVID-19 declaration under Section 56 4(b)(1) of the Act, 21 U.S.C. section 360bbb-3(b)(1), unless the authorization is terminated or revoked sooner. Performed at [facility], [address] [place], [place] [address]        Today   Subjective:   Karen BersHeather Ruiz today has no headache,no chest abdominal pain,no new weakness tingling or numbness, feels much better wants to go home today.   Objective:   Blood pressure (!) 145/91, pulse 80, temperature 97.6 F (36.4 C), temperature source Oral, resp. rate 18, height 5\' 7"  (1.702 m), weight 113.4 kg, last menstrual period 07/14/2019, SpO2 98 %.   Intake/Output Summary (Last 24 hours) at 07/16/2019 1243 Last data filed at 07/16/2019  0800 Gross per 24 hour  Intake 559.68 ml  Output 500 ml  Net 59.68 ml    Exam Awake Alert, Oriented x 3, No new F.N deficits, Normal affect [place].AT,PERRAL Supple Neck,No JVD, No cervical lymphadenopathy appriciated.  Symmetrical Chest wall movement, Good air movement bilaterally, CTAB RRR,No Gallops,Rubs or new Murmurs, No Parasternal Heave +ve B.Sounds, Abd Soft, Non tender, No organomegaly appriciated, No rebound -guarding or rigidity. No Cyanosis, Clubbing or edema, No new Rash or bruise  Data Review   CBC w Diff:  Lab Results  Component Value Date   WBC 9.3 07/16/2019   HGB 13.7 07/16/2019   HCT 40.4 07/16/2019   PLT 299 07/16/2019  CMP:  Lab Results  Component Value Date   NA 139 07/16/2019   K 3.4 (L) 07/16/2019   CL 105 07/16/2019   CO2 22 07/16/2019   BUN 10 07/16/2019   CREATININE 0.55 07/16/2019   PROT 7.5 07/15/2019   ALBUMIN 4.0 07/15/2019   BILITOT 0.6 07/15/2019   ALKPHOS 66 07/15/2019   AST 40 07/15/2019   ALT 51 (H) 07/15/2019  .   Total Time in preparing paper work, data evaluation and todays exam - 35 minutes  Katha Hamming M.D on 07/16/2019 at 12:43 PM    Note: This dictation was prepared with Dragon dictation along with smaller phrase technology. Any transcriptional errors that result from this process are unintentional.

## 2019-07-16 NOTE — Progress Notes (Addendum)
Per MD okay for RN to place order for clear liquids, pt has not have any N/V over night and DC IVF.

## 2019-07-16 NOTE — Progress Notes (Signed)
Inpatient Diabetes Program Recommendations  AACE/ADA: New Consensus Statement on Inpatient Glycemic Control   Target Ranges:  Prepandial:   less than 140 mg/dL      Peak postprandial:   less than 180 mg/dL (1-2 hours)      Critically ill patients:  140 - 180 mg/dL   Results for Karen Ruiz, Karen Ruiz (MRN [id number]) as of 07/16/2019 10:39  Ref. Range 07/15/2019 21:23 07/16/2019 07:41  Glucose-Capillary Latest Ref Range: 70 - 99 mg/dL 221 (H) 165 (H)  Results for Karen Ruiz, Karen Ruiz (MRN [id number]) as of 07/16/2019 10:39  Ref. Range 07/15/2019 10:42  Hemoglobin A1C Latest Ref Range: 4.8 - 5.6 % 10.0 (H)   Review of Glycemic Control  Diabetes history: DM2 Outpatient Diabetes medications: None; was taking Levemir (pens) and Metformin in the past Current orders for Inpatient glycemic control: Novolog 0-9 units TID with meals, Novolog 0-5 units QHS  Inpatient Diabetes Program Recommendations:   HbgA1C:  A1C 10% on 07/15/19 indicating an average glucose of 240 mg/dl over the past 2-3 months. Patient has no insurance and not able to afford to see a provider or get DM medications. Patient was taking Levemir and Metformin in the past and is willing to take insulin and/or oral DM medications for DM control if she can get assistance with medications and follow up. Please provide prescription for DM medications at time of discharge.  NOTE: Spoke with patient over the phone about diabetes and home regimen for diabetes control. Patient reports that she lost her insurance in July and she last seen [facility] in June 2020. Per chart patient had initial visit with [facility] on 03/05/19 and A1C was 10.7% and patient was asked to restart Levemir 11 units QHS and started on Metformin 500 mg BID. Patient states that she has not taken any DM medications in a while.  Patient states that life has been busy and she has let diabetes fall to the wayside.  Patient reports that she has all needed  testing supplies at home to monitor glucose. Informed patient about Reli-On Classic glucometer at [facility] for $9 and box of test strips for $9 if the strips for her current glucometer are too expensive.  Discussed A1C results (10% on 07/15/19) and explained that current A1C indicates an average glucose of 240 mg/dl over the past 2-3 months. Discussed glucose and A1C goals. Discussed importance of checking CBGs and maintaining good CBG control to prevent long-term and short-term complications. Explained how hyperglycemia leads to damage within blood vessels which lead to the common complications seen with uncontrolled diabetes. Stressed to the patient the importance of improving glycemic control to prevent further complications from uncontrolled diabetes. Informed patient about [facility] and informed patient that a consult for CM would be ordered so they can provide more information on both as well as an application. Patient states that she is willing to take DM medications as an outpatient if prescribed at discharge and if she can get medication assistance.  Encouraged patient to check glucose as an outpatient, to take DM medications as prescribed, and to establish care with provider and be sure to follow up routinely. Patient verbalized understanding of information discussed and reports no further questions at this time related to diabetes. Ordered Living Well with DM book and CM consult.  Thanks, Barnie Alderman, RN, MSN, CDE Diabetes Coordinator Inpatient Diabetes Program [phone] (Team Pager)

## 2019-07-16 NOTE — Progress Notes (Signed)
Karen Ruiz  A and O x 4. VSS. Pt tolerating diet well. No complaints of pain or nausea. IV removed intact, prescriptions given. Pt voiced understanding of discharge instructions with no further questions. Pt discharged via wheelchair with RN.    Allergies as of 07/16/2019   No Known Allergies     Medication List    TAKE these medications   amLODipine 5 MG tablet Commonly known as: NORVASC Take 1 tablet (5 mg total) by mouth daily. Start taking on: July 17, 2019   insulin aspart 100 UNIT/ML injection Commonly known as: novoLOG Inject 0-9 Units into the skin 3 (three) times daily with meals.   insulin aspart 100 UNIT/ML injection Commonly known as: novoLOG Inject 0-5 Units into the skin at bedtime.   metFORMIN 500 MG tablet Commonly known as: Glucophage Take 1 tablet (500 mg total) by mouth 2 (two) times daily with a meal.   omeprazole 20 MG capsule Commonly known as: PriLOSEC Take 1 capsule (20 mg total) by mouth daily.   ondansetron 4 MG disintegrating tablet Commonly known as: Zofran ODT Take 1 tablet (4 mg total) by mouth every 8 (eight) hours as needed for nausea or vomiting.   promethazine 12.5 MG tablet Commonly known as: PHENERGAN Take 1 tablet (12.5 mg total) by mouth every 6 (six) hours as needed for nausea or vomiting.       Vitals:   07/16/19 0826 07/16/19 1155  BP: 123/67 (!) 145/91  Pulse: 78 80  Resp:  18  Temp:  97.6 F (36.4 C)  SpO2:  98%    Francesco Sor

## 2019-07-16 NOTE — Discharge Instructions (Signed)
Will give Basaglar insulin 11 units at night instead of Levemir.  Patient has no insurance needs help with medication management, [facility]

## 2019-07-16 NOTE — Discharge Summary (Signed)
Karen Ruiz, is a 38 y.o. female  DOB 1981/05/05  MRN [id number].  Admission date:  07/15/2019  Admitting Physician  Shaune Pollack, MD  Discharge Date:  07/16/2019   Primary MD  Marina Goodell, MD  Recommendations for primary care physician for things to follow:   Follow-up with PCP in 1 week    Admission Diagnosis  Intractable nausea and vomiting [R11.2]   Discharge Diagnosis  Intractable nausea and vomiting [R11.2]   Active Problems:   Intractable nausea and vomiting      Past Medical History:  Diagnosis Date   Barrett esophagus    Migraines    MRSA (methicillin resistant staph aureus) culture positive    2009    History reviewed. No pertinent surgical history.     History of present illness and  Hospital Course:     Kindly see H&P for history of present illness and admission details, please review complete Labs, Consult reports and Test reports for all details in brief  HPI  from the history and physical done on the day of admission 38 year old female patient with history of GERD, questionable Barrett's long time back, diabetes mellitus type 2 and not taking any medicines comes in because of intractable nausea, vomiting.   Hospital Course   #1.  Intractable nausea, vomiting, abdominal pain likely due to acute gastritis, patient admitted to observation status, started on IV Zofran, IV hydration, kept n.p.o.  This morning when I saw the patient the nausea and vomiting completely resolved, we going to give clear liquids and advance as tolerated and see if she tolerates normal food she will go home.  Patient has showed told me that she has history of Barrett's long time ago and not bothered her recently and she used to take Zantac over-the-counter.  We are discharging her home with Prilosec,  Zofran. 2.  Diabetes mellitus type 2, hemoglobin A1c is 10.  Unfortunately patient has no insurance and not able to afford to see a primary doctor regarding diabetes medicines.  Patient was taking Levemir, metformin in the past.  Diabetic coordinator saw the patient, discussed about importance of checking blood glucose and maintaining good glucose levels to prevent long-term complications.  [facility].  Discharging her with Levemir 11 units at night, metformin 500 mg p.o. twice daily.  Also provided prescription for nighttime and daytime short-acting insulin with coverage. #3.  Morbid obesity, advised to lose weight.   Discharge Condition: Stable   Follow UP  Follow-up Information    Feldpausch, Madaline Guthrie, MD. Schedule an appointment as soon as possible for a visit in 1 week(s).   Specialty: Family Medicine Contact information: [address] [place] [address] [phone]        [facility]. Schedule an appointment as soon as possible for a visit in 1 week(s).             Discharge Instructions  and  Discharge Medications   Dose: 0-5 Units Freq: Daily at bedtime Route: [place] Start: 07/15/19 2200   Admin Instructions:  Do NOT hold insulin if patient is NPO.   Order specific questions:  Correction coverage: HS scale CBG < 70: Implement Hypoglycemia Standing Orders and refer to Hypoglycemia Standing Orders sidebar report CBG 70 - 120: 0 units CBG 121 - 150: 0 units CBG 151 - 200: 0 units CBG 201 - 250: 2 units CBG 251 - 300: 3 units CBG 301 - 350: 4  units CBG 351 - 400: 5 units CBG > 400 call MD and obtain STAT lab verification   2200    2200    2200    2200    2200    2200    2200     insulin aspart (novoLOG) injection 0-9 Units  Dose: 0-9 Units Freq: 3 times daily with meals Route: [place] Start: 07/16/19 0800   Admin Instructions:  Do NOT hold if patient is NPO. Sensitive Scale.   Order specific  questions:  Correction coverage: Sensitive (thin, NPO, renal) CBG < 70: Implement Hypoglycemia Standing Orders and refer to Hypoglycemia Standing Orders sidebar report CBG 70 - 120: 0 units CBG 121 - 150: 1 unit CBG 151 - 200: 2 units CBG 201 - 250: 3 units CBG 251 - 300: 5 units CBG 301 - 350: 7 units CBG 351 - 400 9 units CBG > 400 call MD and obtain STAT lab verification   0825   1209   1700    0800   1200   1700    0800   1200   1700    0800   1200   1700    0800   1200   1700    0800   1200   1700    0800   1200   1700     living well with diabetes book MISC  Freq: Once Route: XX Start: 07/16/19 1100   1100                        Allergies as of 07/16/2019   No Known Allergies     Medication List    TAKE these medications   amLODipine 5 MG tablet Commonly known as: NORVASC Take 1 tablet (5 mg total) by mouth daily. Start taking on: July 17, 2019   insulin aspart 100 UNIT/ML injection Commonly known as: novoLOG Inject 0-9 Units into the skin 3 (three) times daily with meals.   insulin aspart 100 UNIT/ML injection Commonly known as: novoLOG Inject 0-5 Units into the skin at bedtime.   metFORMIN 500 MG tablet Commonly known as: Glucophage Take 1 tablet (500 mg total) by mouth 2 (two) times daily with a meal.   omeprazole 20 MG capsule Commonly known as: PriLOSEC Take 1 capsule (20 mg total) by mouth daily.   ondansetron 4 MG disintegrating tablet Commonly known as: Zofran ODT Take 1 tablet (4 mg total) by mouth every 8 (eight) hours as needed for nausea or vomiting.   promethazine 12.5 MG tablet Commonly known as: PHENERGAN Take 1 tablet (12.5 mg total) by mouth every 6 (six) hours as needed for nausea or vomiting.       Changing levemir  To . Basaglar, NovoLog to Humalog.  Diet and Activity recommendation: See Discharge Instructions above   Consults obtained -diabetes coordinator   Major procedures  and Radiology Reports - PLEASE review detailed and final reports for all details, in brief -     Ct Abdomen Pelvis W Contrast  Result Date: 07/15/2019 CLINICAL DATA:  Abdominal pain and vomiting. EXAM: CT ABDOMEN AND PELVIS WITH CONTRAST TECHNIQUE: Multidetector CT imaging of the abdomen and pelvis was performed using the standard protocol following bolus administration of intravenous contrast. CONTRAST:  100mL OMNIPAQUE IOHEXOL 300 MG/ML  SOLN COMPARISON:  CT abdomen pelvis dated August 01, 2010. FINDINGS: Lower chest: No acute abnormality. Unchanged small pericardial cyst at the right cardiophrenic angle. Hepatobiliary: Unchanged  diffuse hepatic steatosis and mild hepatomegaly. No focal liver abnormality. The gallbladder is unremarkable. No biliary dilatation. Pancreas: Unremarkable. No pancreatic ductal dilatation or surrounding inflammatory changes. Spleen: Normal in size without focal abnormality. Adrenals/Urinary Tract: The adrenal glands are unremarkable. Tiny subcentimeter low-density lesion in the upper pole of the right kidney is too small to characterize. No renal calculi or hydronephrosis. The bladder is unremarkable. Stomach/Bowel: Stomach is within normal limits. Appendix appears normal. No evidence of bowel wall thickening, distention, or inflammatory changes. Vascular/Lymphatic: No significant vascular findings are present. No enlarged abdominal or pelvic lymph nodes. Reproductive: Uterus and bilateral adnexa are unremarkable. Tampon in the vagina. Other: Small fat containing umbilical hernia. No free fluid or pneumoperitoneum. Musculoskeletal: No acute or significant osseous findings. Lumbarization of S1. Severe disc height loss at L5-S1 with large partially calcified disc protrusion. IMPRESSION: 1.  No acute intra-abdominal process. 2. Unchanged mild hepatomegaly with steatosis. Electronically Signed   By: Obie Dredge M.D.   On: 07/15/2019 13:46    Micro Results    Recent Results  (from the past 240 hour(s))  SARS CORONAVIRUS 2 (TAT 6-24 HRS) Nasopharyngeal Nasopharyngeal Swab     Status: None   Collection Time: 07/15/19  8:20 PM   Specimen: Nasopharyngeal Swab  Result Value Ref Range Status   SARS Coronavirus 2 NEGATIVE NEGATIVE Final    Comment: (NOTE) SARS-CoV-2 target nucleic acids are NOT DETECTED. The SARS-CoV-2 RNA is generally detectable in upper and lower respiratory specimens during the acute phase of infection. Negative results do not preclude SARS-CoV-2 infection, do not rule out co-infections with other pathogens, and should not be used as the sole basis for treatment or other patient management decisions. Negative results must be combined with clinical observations, patient history, and epidemiological information. The expected result is Negative. Fact Sheet for Patients: HairSlick.no Fact Sheet for Healthcare Providers: quierodirigir.com This test is not yet approved or cleared by the [place] FDA and  has been authorized for detection and/or diagnosis of SARS-CoV-2 by FDA under an Emergency Use Authorization (EUA). This EUA will remain  in effect (meaning this test can be used) for the duration of the COVID-19 declaration under Section 56 4(b)(1) of the Act, 21 U.S.C. section 360bbb-3(b)(1), unless the authorization is terminated or revoked sooner. Performed at [facility], [address], [place], [place] [address]        Today   Subjective:   Karen Ruiz today has no headache,no chest abdominal pain,no new weakness tingling or numbness, feels much better wants to go home today.   Objective:   Blood pressure (!) 145/91, pulse 80, temperature 97.6 F (36.4 C), temperature source Oral, resp. rate 18, height 5\' 7"  (1.702 m), weight 113.4 kg, last menstrual period 07/14/2019, SpO2 98 %.   Intake/Output Summary (Last 24 hours) at 07/16/2019 1416 Last data filed  at 07/16/2019 1300 Gross per 24 hour  Intake 1159.68 ml  Output 500 ml  Net 659.68 ml    Exam Awake Alert, Oriented x 3, No new F.N deficits, Normal affect [place].AT,PERRAL Supple Neck,No JVD, No cervical lymphadenopathy appriciated.  Symmetrical Chest wall movement, Good air movement bilaterally, CTAB RRR,No Gallops,Rubs or new Murmurs, No Parasternal Heave +ve B.Sounds, Abd Soft, Non tender, No organomegaly appriciated, No rebound -guarding or rigidity. No Cyanosis, Clubbing or edema, No new Rash or bruise  Data Review   CBC w Diff:  Lab Results  Component Value Date   WBC 9.3 07/16/2019   HGB 13.7 07/16/2019  HCT 40.4 07/16/2019   PLT 299 07/16/2019    CMP:  Lab Results  Component Value Date   NA 139 07/16/2019   K 3.4 (L) 07/16/2019   CL 105 07/16/2019   CO2 22 07/16/2019   BUN 10 07/16/2019   CREATININE 0.55 07/16/2019   PROT 7.5 07/15/2019   ALBUMIN 4.0 07/15/2019   BILITOT 0.6 07/15/2019   ALKPHOS 66 07/15/2019   AST 40 07/15/2019   ALT 51 (H) 07/15/2019  .   Total Time in preparing paper work, data evaluation and todays exam - 35 minutes  Epifanio Lesches M.D on 07/16/2019 at 2:16 PM    Note: This dictation was prepared with Dragon dictation along with smaller phrase technology. Any transcriptional errors that result from this process are unintentional.

## 2019-07-16 NOTE — TOC Initial Note (Signed)
Transition of Care [facility]) - Initial/Assessment Note    Patient Details  Name: Karen Ruiz MRN: [id number] Date of Birth: Apr 04, 1981  [facility]) CM/SW Contact:    Beverly Sessions, RN Phone Number: 07/16/2019, 3:06 PM  Clinical Narrative:                 Patient admitted for intractable nausea and vomiting  Patient lives at home with significant other.  He is at bedside, and will be transporting at discharge  Patient states that she took a leave of absence from work, and since then has been uninsured and had difficulty obtaining her diabetes medication  [place] in [place].  Patient denies issues with transportation  Patient confirms she has a working glucometer and supplies in the home  RNCM confirmed with Medication Management  They have all medications for discharge available for patient at no cost.  Patient to pick up after discharge today.  Patient provided application to [facility] , [facility] , and "[employer]  Your Guide to [employer] and [employer] in [place]"  Booklet     Expected Discharge Plan: Home/Self Care Barriers to Discharge: Barriers Resolved   Patient Goals and CMS Choice        Expected Discharge Plan and Services Expected Discharge Plan: Home/Self Care         Expected Discharge Date: 07/16/19                                    Prior Living Arrangements/Services   Lives with:: Significant Other   Do you feel safe going back to the place where you live?: Yes      Need for Family Participation in Patient Care: No (Comment) Care giver support system in place?: Yes (comment)   Criminal Activity/Legal Involvement Pertinent to Current Situation/Hospitalization: Yes - Comment as needed  Activities of Daily Living Home Assistive Devices/Equipment: None ADL Screening (condition at time of admission) Patient's cognitive ability adequate to safely complete daily activities?:  Yes Is the patient deaf or have difficulty hearing?: No Does the patient have difficulty seeing, even when wearing glasses/contacts?: No Does the patient have difficulty concentrating, remembering, or making decisions?: No Patient able to express need for assistance with ADLs?: Yes Does the patient have difficulty dressing or bathing?: No Independently performs ADLs?: Yes (appropriate for developmental age) Does the patient have difficulty walking or climbing stairs?: No Weakness of Legs: None Weakness of Arms/Hands: None  Permission Sought/Granted                  Emotional Assessment Appearance:: Appears stated age Attitude/Demeanor/Rapport: Gracious Affect (typically observed): Accepting Orientation: : Oriented to Self, Oriented to Place, Oriented to  Time, Oriented to Situation Alcohol / Substance Use: Tobacco Use Psych Involvement: No (comment)  Admission diagnosis:  Intractable nausea and vomiting [R11.2] Patient Active Problem List   Diagnosis Date Noted  . Intractable nausea and vomiting 07/15/2019   PCP:  Sofie Hartigan, MD Pharmacy:   [facility] [address], [place] - [address] [place] [address] Phone: [phone] Fax: [phone]     Social Determinants of Health (SDOH) Interventions    Readmission Risk Interventions No flowsheet data found.

## 2019-07-26 ENCOUNTER — Emergency Department
Admission: EM | Admit: 2019-07-26 | Discharge: 2019-07-26 | Disposition: A | Discharge status: Home / Self Care | Payer: Medicaid Other | Source: Home / Self Care

## 2019-07-26 ENCOUNTER — Other Ambulatory Visit: Payer: Self-pay

## 2019-07-26 DIAGNOSIS — G43909 Migraine, unspecified, not intractable, without status migrainosus: Secondary | ICD-10-CM | POA: Insufficient documentation

## 2019-07-26 DIAGNOSIS — R1115 Cyclical vomiting syndrome unrelated to migraine: Secondary | ICD-10-CM | POA: Diagnosis present

## 2019-07-26 DIAGNOSIS — K3184 Gastroparesis: Secondary | ICD-10-CM | POA: Diagnosis not present

## 2019-07-26 DIAGNOSIS — F17210 Nicotine dependence, cigarettes, uncomplicated: Secondary | ICD-10-CM | POA: Insufficient documentation

## 2019-07-26 DIAGNOSIS — K2900 Acute gastritis without bleeding: Secondary | ICD-10-CM | POA: Insufficient documentation

## 2019-07-26 DIAGNOSIS — E1143 Type 2 diabetes mellitus with diabetic autonomic (poly)neuropathy: Secondary | ICD-10-CM | POA: Diagnosis not present

## 2019-07-26 DIAGNOSIS — Z79899 Other long term (current) drug therapy: Secondary | ICD-10-CM | POA: Diagnosis not present

## 2019-07-26 DIAGNOSIS — Z20828 Contact with and (suspected) exposure to other viral communicable diseases: Secondary | ICD-10-CM | POA: Diagnosis not present

## 2019-07-26 DIAGNOSIS — R109 Unspecified abdominal pain: Secondary | ICD-10-CM | POA: Insufficient documentation

## 2019-07-26 DIAGNOSIS — R112 Nausea with vomiting, unspecified: Secondary | ICD-10-CM | POA: Diagnosis not present

## 2019-07-26 DIAGNOSIS — K2100 Gastro-esophageal reflux disease with esophagitis, without bleeding: Secondary | ICD-10-CM | POA: Insufficient documentation

## 2019-07-26 DIAGNOSIS — F1290 Cannabis use, unspecified, uncomplicated: Secondary | ICD-10-CM | POA: Insufficient documentation

## 2019-07-26 DIAGNOSIS — K228 Other specified diseases of esophagus: Secondary | ICD-10-CM | POA: Diagnosis not present

## 2019-07-26 DIAGNOSIS — Z794 Long term (current) use of insulin: Secondary | ICD-10-CM | POA: Diagnosis not present

## 2019-07-26 DIAGNOSIS — Z6839 Body mass index (BMI) 39.0-39.9, adult: Secondary | ICD-10-CM | POA: Diagnosis not present

## 2019-07-26 DIAGNOSIS — Z5321 Procedure and treatment not carried out due to patient leaving prior to being seen by health care provider: Secondary | ICD-10-CM | POA: Insufficient documentation

## 2019-07-26 LAB — LIPASE, BLOOD: Lipase: 16 U/L (ref 11–51)

## 2019-07-26 LAB — CBC
HCT: 45.4 % (ref 36.0–46.0)
Hemoglobin: 15.5 g/dL — ABNORMAL HIGH (ref 12.0–15.0)
MCH: 29.9 pg (ref 26.0–34.0)
MCHC: 34.1 g/dL (ref 30.0–36.0)
MCV: 87.6 fL (ref 80.0–100.0)
Platelets: 330 10*3/uL (ref 150–400)
RBC: 5.18 MIL/uL — ABNORMAL HIGH (ref 3.87–5.11)
RDW: 13.6 % (ref 11.5–15.5)
WBC: 11.5 10*3/uL — ABNORMAL HIGH (ref 4.0–10.5)
nRBC: 0 % (ref 0.0–0.2)

## 2019-07-26 LAB — COMPREHENSIVE METABOLIC PANEL
ALT: 32 U/L (ref 0–44)
AST: 23 U/L (ref 15–41)
Albumin: 4.1 g/dL (ref 3.5–5.0)
Alkaline Phosphatase: 68 U/L (ref 38–126)
Anion gap: 13 (ref 5–15)
BUN: 14 mg/dL (ref 6–20)
CO2: 20 mmol/L — ABNORMAL LOW (ref 22–32)
Calcium: 9.2 mg/dL (ref 8.9–10.3)
Chloride: 103 mmol/L (ref 98–111)
Creatinine, Ser: 0.58 mg/dL (ref 0.44–1.00)
GFR calc Af Amer: >60 mL/min (ref >60)
GFR calc non Af Amer: >60 mL/min (ref >60)
Glucose, Bld: 268 mg/dL — ABNORMAL HIGH (ref 70–99)
Potassium: 4 mmol/L (ref 3.5–5.1)
Sodium: 136 mmol/L (ref 135–145)
Total Bilirubin: 0.9 mg/dL (ref 0.3–1.2)
Total Protein: 7.8 g/dL (ref 6.5–8.1)

## 2019-07-26 NOTE — ED Notes (Signed)
Returned to room to continue triaging patient; first nurse, Ena Dawley, into room to speak with pt and she started yelling and cursing at staff; pt grabbed her things and walked out with steady gait; no moaning or crying noted during this encounter; pt left and did not finish the triage process

## 2019-07-26 NOTE — ED Triage Notes (Addendum)
Pt states has had a week of abd pain and vomiting, pt complains of generalized abd pain. Pt denies diarrhea or fever. Police officer into triage room due to pt's inappropriate behavior. Pt will not stay still in triage for diagnostic testing, shouting, yelling, uncooperative.

## 2019-07-26 NOTE — ED Notes (Signed)
This RN on phone to L&D att when sig other entered lobby and accused this RN of "putting [pt] out" and telling her "to shut up", this RN states exactly what happened was that Margarita Grizzle, Therapist, sports, approached me to c/o about this pt not cooperating with triage and when I entered triage 1 I asked the pt to please put on her mask and please cooperate with the triage questions, pt then stated "I'm out! Fuck this raggedy-ass place!" pt then proceeded to leave out lobby.  This RN walked with pt to sliding doors to see if she'd be willing to complete the triage process, pt reports "fuck off"  Sig other upon hearing the sequence of events threatened "you gonna need a doctor doc!"  [employer] Suella Broad engaging with family member this RN returned to L&D phone call

## 2019-07-26 NOTE — ED Provider Notes (Signed)
-----------------------------------------   11:31 PM on 07/26/2019 -----------------------------------------  Patient evaluated in subwaiting area, where she describes acute on chronic upper abdominal pain.  Labs thus far are unremarkable and patient appears overall well with reassuring vital signs.  Reviewed lab work thus far, which is unremarkable.  She had CT scan performed for the symptoms 11 days ago, which was unremarkable.  Assured patient that she will be further evaluated as soon as room becomes available, she remains stable at this time.   Blake Divine, MD 07/26/19 [phone]

## 2019-07-26 NOTE — ED Notes (Signed)
Pt noted to be resting quietly in sub wait by charge nurse; eyes closed, resp even and unlabored;

## 2019-07-26 NOTE — ED Notes (Signed)
Walked into bathroom in on pt to pt sticking fingers down throat and guzzling water intermittently from sink.

## 2019-07-26 NOTE — ED Triage Notes (Addendum)
Pt reports she has Barretts Esophagus and has been feeling bad all day; pt moaning loudly, crying loudly with no tears, walking around triage room; unable to triage pt completely or assess the entire situation as pt will not sit still for blood pressure or stop moaning and grunting; pt says "I feel bad, damn it"; "I can't fucking help it"; this RN stepped out of the room to diffuse the situation;

## 2019-07-27 ENCOUNTER — Other Ambulatory Visit: Payer: Self-pay

## 2019-07-27 ENCOUNTER — Encounter: Payer: Self-pay | Admitting: Emergency Medicine

## 2019-07-27 ENCOUNTER — Emergency Department: Payer: Medicaid Other

## 2019-07-27 ENCOUNTER — Observation Stay
Admission: EM | Admit: 2019-07-27 | Discharge: 2019-07-29 | Disposition: A | Discharge status: Home / Self Care | Payer: Medicaid Other | Attending: Internal Medicine | Admitting: Internal Medicine

## 2019-07-27 DIAGNOSIS — R1115 Cyclical vomiting syndrome unrelated to migraine: Secondary | ICD-10-CM

## 2019-07-27 DIAGNOSIS — K3184 Gastroparesis: Secondary | ICD-10-CM

## 2019-07-27 DIAGNOSIS — K2900 Acute gastritis without bleeding: Secondary | ICD-10-CM | POA: Diagnosis present

## 2019-07-27 DIAGNOSIS — E1143 Type 2 diabetes mellitus with diabetic autonomic (poly)neuropathy: Secondary | ICD-10-CM

## 2019-07-27 DIAGNOSIS — R112 Nausea with vomiting, unspecified: Secondary | ICD-10-CM

## 2019-07-27 HISTORY — DX: Gastroparesis: K31.84

## 2019-07-27 HISTORY — DX: Obesity, unspecified: E66.9

## 2019-07-27 LAB — HCG, QUANTITATIVE, PREGNANCY: hCG, Beta Chain, Quant, S: <1 m[IU]/mL (ref <5)

## 2019-07-27 LAB — URINALYSIS, COMPLETE (UACMP) WITH MICROSCOPIC
Bacteria, UA: NONE SEEN
Bilirubin Urine: NEGATIVE
Glucose, UA: NEGATIVE mg/dL
Hgb urine dipstick: NEGATIVE
Ketones, ur: NEGATIVE mg/dL
Leukocytes,Ua: NEGATIVE
Nitrite: NEGATIVE
Protein, ur: 100 mg/dL — AB
Specific Gravity, Urine: 1.038 — ABNORMAL HIGH (ref 1.005–1.030)
pH: 5 (ref 5.0–8.0)

## 2019-07-27 LAB — SARS CORONAVIRUS 2 (TAT 6-24 HRS): SARS Coronavirus 2: NEGATIVE

## 2019-07-27 LAB — GLUCOSE, CAPILLARY
Glucose-Capillary: 235 mg/dL — ABNORMAL HIGH (ref 70–99)
Glucose-Capillary: 163 mg/dL — ABNORMAL HIGH (ref 70–99)
Glucose-Capillary: 153 mg/dL — ABNORMAL HIGH (ref 70–99)

## 2019-07-27 MED ORDER — DROPERIDOL 2.5 MG/ML IJ SOLN: 2.5000 mg | Freq: Once | INTRAMUSCULAR | Status: DC

## 2019-07-27 MED ORDER — DROPERIDOL 2.5 MG/ML IJ SOLN
2.5000 mg | Freq: Once | INTRAMUSCULAR | Status: AC
  Administered 2019-07-27: 2.5 mg via INTRAVENOUS

## 2019-07-27 MED ORDER — SODIUM CHLORIDE 0.9 % IV SOLN
INTRAVENOUS | Status: DC
  Administered 2019-07-27 – 2019-07-28 (×3): via INTRAVENOUS

## 2019-07-27 MED ORDER — METOCLOPRAMIDE HCL 5 MG/ML IJ SOLN
10.0000 mg | Freq: Four times a day (QID) | INTRAMUSCULAR | Status: DC
  Administered 2019-07-27 – 2019-07-29 (×8): 10 mg via INTRAVENOUS
  Filled 2019-07-27 (×8): qty 2

## 2019-07-27 MED ORDER — CAPSICUM OLEORESIN 0.025 % EX CREA
TOPICAL_CREAM | CUTANEOUS | Status: AC
  Administered 2019-07-27: 1 via TOPICAL
  Filled 2019-07-27: qty 60

## 2019-07-27 MED ORDER — SODIUM CHLORIDE 0.9 % IV BOLUS
500.0000 mL | Freq: Once | INTRAVENOUS | Status: AC
  Administered 2019-07-27: 500 mL via INTRAVENOUS

## 2019-07-27 MED ORDER — ONDANSETRON HCL 4 MG/2ML IJ SOLN
4.0000 mg | Freq: Four times a day (QID) | INTRAMUSCULAR | Status: DC | PRN
  Administered 2019-07-28 (×2): 4 mg via INTRAVENOUS
  Filled 2019-07-27 (×2): qty 2

## 2019-07-27 MED ORDER — AMLODIPINE BESYLATE 5 MG PO TABS
5.0000 mg | ORAL_TABLET | Freq: Every day | ORAL | Status: DC
  Administered 2019-07-28 – 2019-07-29 (×2): 5 mg via ORAL
  Filled 2019-07-27 (×2): qty 1

## 2019-07-27 MED ORDER — SODIUM CHLORIDE 0.9 % IV SOLN
Freq: Once | INTRAVENOUS | Status: AC
  Administered 2019-07-27: 09:00:00 via INTRAVENOUS

## 2019-07-27 MED ORDER — PANTOPRAZOLE SODIUM 40 MG IV SOLR
40.0000 mg | INTRAVENOUS | Status: DC
  Administered 2019-07-27: 40 mg via INTRAVENOUS
  Filled 2019-07-27: qty 40

## 2019-07-27 MED ORDER — ASPIRIN-ACETAMINOPHEN-CAFFEINE 250-250-65 MG PO TABS
1.0000 | ORAL_TABLET | Freq: Four times a day (QID) | ORAL | Status: DC | PRN
  Administered 2019-07-27 – 2019-07-28 (×2): 2 via ORAL
  Filled 2019-07-27 (×3): qty 2

## 2019-07-27 MED ORDER — DROPERIDOL 2.5 MG/ML IJ SOLN
5.0000 mg | Freq: Once | INTRAMUSCULAR | Status: AC
  Administered 2019-07-27: 5 mg via INTRAMUSCULAR
  Filled 2019-07-27: qty 2

## 2019-07-27 MED ORDER — INSULIN ASPART 100 UNIT/ML ~~LOC~~ SOLN: 0.0000 [IU] | Freq: Every day | SUBCUTANEOUS | Status: DC

## 2019-07-27 MED ORDER — PANTOPRAZOLE SODIUM 40 MG IV SOLR
40.0000 mg | Freq: Two times a day (BID) | INTRAVENOUS | Status: DC
  Administered 2019-07-27 – 2019-07-29 (×4): 40 mg via INTRAVENOUS
  Filled 2019-07-27 (×4): qty 40

## 2019-07-27 MED ORDER — DIPHENHYDRAMINE HCL 50 MG/ML IJ SOLN: 12.5000 mg | INTRAMUSCULAR | Status: DC

## 2019-07-27 MED ORDER — ENOXAPARIN SODIUM 40 MG/0.4ML ~~LOC~~ SOLN
40.0000 mg | SUBCUTANEOUS | Status: DC
  Administered 2019-07-27 – 2019-07-29 (×3): 40 mg via SUBCUTANEOUS
  Filled 2019-07-27 (×3): qty 0.4

## 2019-07-27 MED ORDER — INSULIN ASPART 100 UNIT/ML ~~LOC~~ SOLN
0.0000 [IU] | Freq: Three times a day (TID) | SUBCUTANEOUS | Status: DC
  Administered 2019-07-27 – 2019-07-28 (×2): 2 [IU] via SUBCUTANEOUS
  Administered 2019-07-28: 1 [IU] via SUBCUTANEOUS
  Administered 2019-07-28: 2 [IU] via SUBCUTANEOUS
  Administered 2019-07-29: 1 [IU] via SUBCUTANEOUS
  Filled 2019-07-27 (×5): qty 1

## 2019-07-27 MED ORDER — SUCRALFATE 1 G PO TABS: 1.0000 g | ORAL_TABLET | Freq: Four times a day (QID) | ORAL | 1 refills | Status: DC | PRN

## 2019-07-27 MED ORDER — ONDANSETRON 4 MG PO TBDP: ORAL_TABLET | ORAL | 0 refills | Status: DC

## 2019-07-27 NOTE — ED Notes (Signed)
Admitting MD at bedside.

## 2019-07-27 NOTE — ED Provider Notes (Addendum)
[facility] Emergency Department Provider Note  ____________________________________________   First MD Initiated Contact with Patient 07/27/19 [phone]     (approximate)  I have reviewed the triage vital signs and the nursing notes.   HISTORY  Chief Complaint Abdominal Pain  Level 5 caveat:  history/ROS limited by acute pain, vomiting, screaming and cursing at ED staff, and general lack of cooperation.  HPI Karen Ruiz is a 38 y.o. female with history as listed below which notably includes several recent visits for generalized abdominal pain, nausea, and vomiting thought to be secondary to gastroparesis.   She presented to the waiting room tonight reporting about a week of abdominal pain, nausea, and vomiting without diarrhea or fever and stating that she has had bowel movements.  She was last seen in this emergency department about 2 weeks ago for the same symptoms and in fact was admitted after coming back to the emergency department after initially being discharged and not filling her medications.  She was discharged within about 24 hours from the hospital reportedly feeling better.  She is minimally able to cooperate with history and physical due to repeatedly retching, ambulating around the room, and screaming and yelling in discomfort.  See nursing notes for additional details, but she required redirection by security personnel in the lobby as well as security personnel to intervene with her significant other who is issuing threats to ED staff.  When she got back to my exam room, she was sitting on the toilet, moaning, yelling, screaming, and retching without producing emesis that I could see.  Of note, as documented in the nursing notes, one of the ED nurses walked in on her "counseling" water from the sink and sticking her fingers down her throat to induce vomiting.  The patient is unable or unwilling to tell me anything other than that she is hurting  and feels sick and "no one will fucking help me."        Past Medical History:  Diagnosis Date   Barrett esophagus    Gastroparesis    Migraines    MRSA (methicillin resistant staph aureus) culture positive    2009   Obesity     Patient Active Problem List   Diagnosis Date Noted   Intractable nausea and vomiting 07/15/2019    History reviewed. No pertinent surgical history.  Prior to Admission medications   Medication Sig Start Date End Date Taking? Authorizing Provider  amLODipine (NORVASC) 5 MG tablet Take 1 tablet (5 mg total) by mouth daily. 07/17/19   Epifanio Lesches, MD  insulin aspart (NOVOLOG) 100 UNIT/ML injection Inject 0-9 Units into the skin 3 (three) times daily with meals. 07/16/19   Epifanio Lesches, MD  insulin aspart (NOVOLOG) 100 UNIT/ML injection Inject 0-5 Units into the skin at bedtime. 07/16/19   Epifanio Lesches, MD  metFORMIN (GLUCOPHAGE) 500 MG tablet Take 1 tablet (500 mg total) by mouth 2 (two) times daily with a meal. 07/16/19 07/15/20  Epifanio Lesches, MD  omeprazole (PRILOSEC) 20 MG capsule Take 1 capsule (20 mg total) by mouth daily. 07/16/19 07/15/20  Epifanio Lesches, MD  ondansetron (ZOFRAN ODT) 4 MG disintegrating tablet Allow 1-2 tablets to dissolve in your mouth every 8 hours as needed for nausea/vomiting 07/27/19   Hinda Kehr, MD  promethazine (PHENERGAN) 12.5 MG tablet Take 1 tablet (12.5 mg total) by mouth every 6 (six) hours as needed for nausea or vomiting. 07/15/19   Blake Divine, MD  sucralfate (CARAFATE) 1 g tablet  Take 1 tablet (1 g total) by mouth 4 (four) times daily as needed (for abdominal discomfort, nausea, and/or vomiting). 07/27/19   Loleta Rose, MD    Allergies Patient has no known allergies.  Family History  Problem Relation Age of Onset   Diabetes Mother    Cancer Mother    Heart failure Mother    Hypertension Mother    Heart attack Father    Diabetes Father     Hypertension Father     Social History Social History   Tobacco Use   Smoking status: Current Every Day Smoker    Packs/day: 0.50    Types: Cigarettes   Smokeless tobacco: Never Used  Substance Use Topics   Alcohol use: Yes    Comment: socially   Drug use: Not on file    Review of Systems Level 5 caveat:  history/ROS limited by acute pain, vomiting, screaming and cursing at ED staff, and general lack of cooperation.  The patient reports abdominal pain, nausea, and vomiting.  ____________________________________________   PHYSICAL EXAM:  VITAL SIGNS: ED Triage Vitals  Enc Vitals Group     BP 07/26/19 2226 (!) 131/100     Pulse Rate 07/26/19 2226 80     Resp 07/26/19 2224 16     Temp 07/26/19 2224 97.9 F (36.6 C)     Temp Source 07/26/19 2224 Oral     SpO2 --      Weight 07/26/19 2225 113.4 kg (250 lb)     Height 07/26/19 2225 1.702 m ( )     Head Circumference --      Peak Flow --      Pain Score 07/26/19 2224 10     Pain Loc --      Pain Edu? --      Excl. in GC? --     Constitutional: Alert and oriented.  Minimally cooperative, presents as 1 and a great deal of distress though notably with normal vital signs except for some mild hypertension. Eyes: Conjunctivae are normal.  Head: Atraumatic. Nose: No congestion/rhinnorhea. Neck: No stridor.  No meningeal signs.   Cardiovascular: Normal rate, regular rhythm. Good peripheral circulation. Grossly normal heart sounds. Respiratory: Normal respiratory effort.  No retractions. Gastrointestinal: Obese.  Nondistended.  Generalized tenderness to palpation throughout the abdomen with no focal tenderness. Musculoskeletal: Ambulatory without distress around her exam room in spite of her reported pain and other symptoms.  No lower extremity tenderness nor edema. No gross deformities of extremities. Neurologic:  Normal speech and language. No gross focal neurologic deficits are appreciated.  Skin:  Skin is warm, dry  and intact. Psychiatric: Mood and affect are emotional, labile (as documented in the nursing notes, at one point she was sitting quietly in subwait, and then became angry and started screaming again), verbally aggressive with ED staff.  When her ED nurse, Lurena Joiner, and I went to see her in the exam room within minutes of her being placed in a room, first she was yelling at [place] about how no one would help.  When we both sternly asked her to be quiet and stop yelling and that we would help her she started crying.  ____________________________________________   LABS (all labs ordered are listed, but only abnormal results are displayed)  Labs Reviewed  COMPREHENSIVE METABOLIC PANEL - Abnormal; Notable for the following components:      Result Value   CO2 20 (*)    Glucose, Bld 268 (*)    All other components  within normal limits  CBC - Abnormal; Notable for the following components:   WBC 11.5 (*)    RBC 5.18 (*)    Hemoglobin 15.5 (*)    All other components within normal limits  SARS CORONAVIRUS 2 (TAT 6-24 HRS)  LIPASE, BLOOD  HCG, QUANTITATIVE, PREGNANCY  URINALYSIS, COMPLETE (UACMP) WITH MICROSCOPIC   ____________________________________________  EKG  ED ECG REPORT I, Loleta Roseory Heer Justiss, the attending physician, personally viewed and interpreted this ECG.  Date: 07/26/2019 EKG Time: 22: 32 Rate: 79 Rhythm: normal sinus rhythm QRS Axis: normal Intervals: normal ST/T Wave abnormalities: Non-specific ST segment / T-wave changes, but no clear evidence of acute ischemia. Narrative Interpretation: no definitive evidence of acute ischemia; does not meet STEMI criteria.   ____________________________________________  RADIOLOGY I, Loleta Roseory Solash Tullo, personally viewed and evaluated these images (plain radiographs) as part of my medical decision making, as well as reviewing the written report by the radiologist.  ED MD interpretation:  No acute abnormalities identified on  radiographs  Official radiology report(s): Dg Abdomen Acute W/chest  Result Date: 07/27/2019 CLINICAL DATA:  Abdominal pain, nausea and vomiting. EXAM: DG ABDOMEN ACUTE W/ 1V CHEST COMPARISON:  CT scan 07/15/2019 FINDINGS: The upright chest x-ray demonstrates normal heart size. The mediastinal and hilar contours are normal. The lungs are clear. Two views of the abdomen demonstrate an unremarkable bowel gas pattern. Scattered air and stool noted throughout the colon and down into the rectum. No findings for small bowel obstruction or free air. The soft tissue shadows of the abdomen are maintained. No worrisome calcifications are identified. The bony structures are unremarkable. IMPRESSION: Unremarkable acute abdominal series. Electronically Signed   By: Rudie MeyerP.  Gallerani M.D.   On: 07/27/2019 04:58    ____________________________________________   PROCEDURES   Procedure(s) performed (including Critical Care):  Procedures   ____________________________________________   INITIAL IMPRESSION / MDM / ASSESSMENT AND PLAN / ED COURSE  As part of my medical decision making, I reviewed the following data within the electronic MEDICAL RECORD NUMBER Nursing notes reviewed and incorporated, Labs reviewed , EKG interpreted , Old chart reviewed, Radiograph reviewed , Notes from prior ED visits and [place] Controlled Substance Database   Differential diagnosis includes, but is not limited to, gastroparesis, cyclic vomiting syndrome, cannabinoid hyperemesis syndrome, SBO/ileus, factitious disorder.  The patient is very difficult to redirect and is very disruptive both in triage and once coming back to the exam room.  It is clear that to make her feel better as well as to facilitate an improved exam she will require some medication.  For the symptoms of nausea, vomiting, and the generalized pain that appears if not chronic then frequently recurrent, I have ordered droperidol 5 mg intramuscular.  The patient will not  hold still long enough to allow for IV access.  Once she has calm down is getting some relief from the medication I will ask the nurse, Lurena JoinerRebecca to place a peripheral IV in case additional medications are needed.  The patient had full medical work-ups within the last couple of weeks and has had repeated abdominal imaging in the past and I think.  CT scans of the abdomen and pelvis are unlikely to be helpful.  Her lab work is back and notable for hyperglycemia but otherwise normal comprehensive metabolic panel, normal lipase, and a normal CBC.  I will add on a beta hCG since she has thus far been unable to provide a urine specimen.      Clinical Course as of Jul 26 276  Mon Jul 27, 2019  0255 Patient sleeping, no acute distress or discomfort   [CF]  0356 The patient has been resting comfortably.  I went in to reassess her.  She is more calm and cooperative at this time.  I was able to palpate all over her abdomen with no reproduction of any pain or tenderness.  She has not been retching or vomiting for a few hours and she is much more calm.  I asked her if she was ready to go home given that there is no evidence that she needs any other emergent intervention or imaging, and she adamantly said no, because she knows this is going to happen again.  I asked her about marijuana use and she confirmed that she does smoke marijuana regularly although she claims not every day.  I talked to her about cannabinoid hyperemesis syndrome and she states adamantly that that is not her issue.  I explained that the symptoms are all consistent, and ask her if she feels better when she is in a hot bath or shower, and she said yes, but she still refuses to believe that marijuana may play a role.  I explained that there was no other emergent intervention but she says that she is not going to go for now.I have asked her nurse to check in with her and to see if she still wants to stay, and if so, to place an IV so we can try another  dose of medication and to give her some fluids.  Assuming she agrees to the plan, I will have Lurena Joiner place an IV and I will give her 500 mL normal saline and droperidol 2.5 mg IV.   [CF]  0401 The patient is now moaning and crying out again as she was before.  I have asked the nurse to proceed with droperidol.  I have also ordered capsaicin cream for her abdomen.   [CF]  0501 HCG, Beta Chain, Quant, S: <1 [CF]  0501 Unremarkable acute abdomen series with no evidence of any dilated bowel loops or other concerning findings to suggest an SBO.  DG Abdomen Acute W/Chest [CF]  0702 Patient felt some better, was given saltines and ginger ale, now feels worse again with some minimal abdominal tenderness but persistent and worsening nausea.  She says if she is discharged she will come right back because this usually takes days to pass.  I have paged the hospitalist for admission.  Sending non-urgent COVID swab.   [CF]  0720 I spoke by phone with Webb Silversmith of the hospitalist service.  We discussed the case and she will pass along the admission to her daytime colleagues.   [CF]    Clinical Course User Index [CF] Loleta Rose, MD     ____________________________________________  FINAL CLINICAL IMPRESSION(S) / ED DIAGNOSES  Final diagnoses:  Nausea and vomiting, intractability of vomiting not specified, unspecified vomiting type  Cyclic vomiting syndrome  Cannabinoid hyperemesis syndrome  Diabetic gastroparesis (HCC)     MEDICATIONS GIVEN DURING THIS VISIT:  Medications  0.9 %  sodium chloride infusion (has no administration in time range)  droperidol (INAPSINE) 2.5 MG/ML injection 5 mg (5 mg Intramuscular Given 07/27/19 0043)  droperidol (INAPSINE) 2.5 MG/ML injection 2.5 mg (2.5 mg Intravenous Given 07/27/19 0420)  capsicum oleoresin (TRIXAICIN) 0.025 % cream (1 application Topical Given 07/27/19 0440)  sodium chloride 0.9 % bolus 500 mL (0 mLs Intravenous Stopped 07/27/19 0703)      ED Discharge Orders  Ordered    ondansetron (ZOFRAN ODT) 4 MG disintegrating tablet     07/27/19 0432    sucralfate (CARAFATE) 1 g tablet  4 times daily PRN     07/27/19 0432          *Please note:  AZALEAH USMAN was evaluated in Emergency Department on 07/27/2019 for the symptoms described in the history of present illness. She was evaluated in the context of the global COVID-19 pandemic, which necessitated consideration that the patient might be at risk for infection with the SARS-CoV-2 virus that causes COVID-19. Institutional protocols and algorithms that pertain to the evaluation of patients at risk for COVID-19 are in a state of rapid change based on information released by regulatory bodies including the CDC and federal and state organizations. These policies and algorithms were followed during the patient's care in the ED.  Some ED evaluations and interventions may be delayed as a result of limited staffing during the pandemic.*  Note:  This document was prepared using Dragon voice recognition software and may include unintentional dictation errors.   Loleta Rose, MD 07/27/19 1610    Loleta Rose, MD 07/27/19 [phone]

## 2019-07-27 NOTE — ED Notes (Signed)
Pt PO trialed at this time

## 2019-07-27 NOTE — Consult Note (Signed)
Cephas Darby, MD [address]  [place]  [place], [place] [address]  Main: [phone]  Fax: [phone] Pager: [phone]   Consultation  Referring Provider:     No ref. provider found Primary Care Physician:  Sofie Hartigan, MD Primary Gastroenterologist:  Dr. Mariea Clonts         Reason for Consultation:   Epigastric pain, nausea and vomiting  Date of Admission:  07/27/2019 Date of Consultation:  07/27/2019         HPI:   Karen Ruiz is a 38 y.o. female with metabolic syndrome, poorly controlled diabetes, multiple ER visits for generalized abdominal pain presented with another flareup that started about a week ago.  She was recently admitted about 10 days ago with same symptoms.  Recent CT abdomen and pelvis did not reveal any acute intra-abdominal pathology.  X-ray KUB was normal.  Labs including CBC, LFTs, electrolytes and lipase were normal. GI is consulted for further management Patient reports that she has not been paying attention to controlling her sugars in last 6 to 8 months Patient reports feeling better since admission, receiving IV fluids, Reglan NSAIDs: None  Antiplts/Anticoagulants/Anti thrombotics: None  GI Procedures: Reportedly underwent EGD about 9 to 10 years ago  Past Medical History:  Diagnosis Date  . Barrett esophagus   . Gastroparesis   . Migraines   . MRSA (methicillin resistant staph aureus) culture positive    2009  . Obesity     History reviewed. No pertinent surgical history.  Prior to Admission medications   Medication Sig Start Date End Date Taking? Authorizing Provider  amLODipine (NORVASC) 5 MG tablet Take 1 tablet (5 mg total) by mouth daily. 07/17/19   Epifanio Lesches, MD  insulin aspart (NOVOLOG) 100 UNIT/ML injection Inject 0-9 Units into the skin 3 (three) times daily with meals. 07/16/19   Epifanio Lesches, MD  insulin aspart (NOVOLOG) 100 UNIT/ML injection Inject 0-5 Units into the skin at  bedtime. 07/16/19   Epifanio Lesches, MD  metFORMIN (GLUCOPHAGE) 500 MG tablet Take 1 tablet (500 mg total) by mouth 2 (two) times daily with a meal. 07/16/19 07/15/20  Epifanio Lesches, MD  omeprazole (PRILOSEC) 20 MG capsule Take 1 capsule (20 mg total) by mouth daily. 07/16/19 07/15/20  Epifanio Lesches, MD  ondansetron (ZOFRAN ODT) 4 MG disintegrating tablet Allow 1-2 tablets to dissolve in your mouth every 8 hours as needed for nausea/vomiting 07/27/19   Hinda Kehr, MD  promethazine (PHENERGAN) 12.5 MG tablet Take 1 tablet (12.5 mg total) by mouth every 6 (six) hours as needed for nausea or vomiting. 07/15/19   Blake Divine, MD  sucralfate (CARAFATE) 1 g tablet Take 1 tablet (1 g total) by mouth 4 (four) times daily as needed (for abdominal discomfort, nausea, and/or vomiting). 07/27/19   Hinda Kehr, MD   Current Facility-Administered Medications:  .  0.9 %  sodium chloride infusion, , Intravenous, Continuous, Ojie, Jude, MD, Last Rate: 100 mL/hr at 07/27/19 1618 .  amLODipine (NORVASC) tablet 5 mg, 5 mg, Oral, Daily, Ojie, Jude, MD .  aspirin-acetaminophen-caffeine (EXCEDRIN MIGRAINE) per tablet 1-2 tablet, 1-2 tablet, Oral, Q6H PRN, Stark Jock, Jude, MD, 2 tablet at 07/27/19 1711 .  enoxaparin (LOVENOX) injection 40 mg, 40 mg, Subcutaneous, Q24H, Ojie, Jude, MD, 40 mg at 07/27/19 0924 .  insulin aspart (novoLOG) injection 0-5 Units, 0-5 Units, Subcutaneous, QHS, Ojie, Jude, MD .  insulin aspart (novoLOG) injection 0-9 Units, 0-9 Units, Subcutaneous, TID WC, Ojie, Jude, MD, 2  Units at 07/27/19 1335 .  metoCLOPramide (REGLAN) injection 10 mg, 10 mg, Intravenous, Q6H, Ojie, Jude, MD, 10 mg at 07/27/19 0926 .  ondansetron (ZOFRAN) injection 4 mg, 4 mg, Intravenous, Q6H PRN, Ojie, Jude, MD .  pantoprazole (PROTONIX) injection 40 mg, 40 mg, Intravenous, Q12H, Daeveon Zweber, Tally Due, MD   Family History  Problem Relation Age of Onset  . Diabetes Mother   . Cancer Mother   . Heart  failure Mother   . Hypertension Mother   . Heart attack Father   . Diabetes Father   . Hypertension Father      Social History   Tobacco Use  . Smoking status: Current Every Day Smoker    Packs/day: 0.50    Types: Cigarettes  . Smokeless tobacco: Never Used  Substance Use Topics  . Alcohol use: Yes    Comment: socially  . Drug use: Not on file    Allergies as of 07/26/2019  . (No Known Allergies)    Review of Systems:    All systems reviewed and negative except where noted in HPI.   Physical Exam:  Vital signs in last 24 hours: Temp:  [97.9 F (36.6 C)] 97.9 F (36.6 C) (10/25 2224) Pulse Rate:  [73-94] 93 (10/26 1230) Resp:  [15-18] 18 (10/26 1230) BP: (120-164)/(77-100) 138/84 (10/26 1230) SpO2:  [94 %-97 %] 96 % (10/26 1230) Weight:  [113.4 kg] 113.4 kg (10/26 1228)   General:   Pleasant, cooperative in NAD Head:  Normocephalic and atraumatic. Eyes:   No icterus.   Conjunctiva pink. PERRLA. Ears:  Normal auditory acuity. Neck:  Supple; no masses or thyroidomegaly Lungs: Respirations even and unlabored. Lungs clear to auscultation bilaterally.   No wheezes, crackles, or rhonchi.  Heart:  Regular rate and rhythm;  Without murmur, clicks, rubs or gallops Abdomen:  Soft, nondistended, nontender. Normal bowel sounds. No appreciable masses or hepatomegaly.  No rebound or guarding.  Rectal:  Not performed. Msk:  Symmetrical without gross deformities.  Strength normal Extremities:  Without edema, cyanosis or clubbing. Neurologic:  Alert and oriented x3;  grossly normal neurologically. Skin:  Intact without significant lesions or rashes. Psych:  Alert and cooperative. Normal affect.  LAB RESULTS: CBC Latest Ref Rng & Units 07/26/2019 07/16/2019 07/15/2019  WBC 4.0 - 10.5 K/uL 11.5(H) 9.3 11.5(H)  Hemoglobin 12.0 - 15.0 g/dL 15.5(H) 13.7 15.7(H)  Hematocrit 36.0 - 46.0 % 45.4 40.4 45.9  Platelets 150 - 400 K/uL 330 299 360    BMET BMP Latest Ref Rng & Units  07/26/2019 07/16/2019 07/15/2019  Glucose 70 - 99 mg/dL 268(H) 172(H) 332(H)  BUN 6 - 20 mg/dL '14 10 15  ' Creatinine 0.44 - 1.00 mg/dL 0.58 0.55 0.54  Sodium 135 - 145 mmol/L 136 139 135  Potassium 3.5 - 5.1 mmol/L 4.0 3.4(L) 4.1  Chloride 98 - 111 mmol/L 103 105 104  CO2 22 - 32 mmol/L 20(L) 22 19(L)  Calcium 8.9 - 10.3 mg/dL 9.2 8.1(L) 9.0    LFT Hepatic Function Latest Ref Rng & Units 07/26/2019 07/15/2019  Total Protein 6.5 - 8.1 g/dL 7.8 7.5  Albumin 3.5 - 5.0 g/dL 4.1 4.0  AST 15 - 41 U/L 23 40  ALT 0 - 44 U/L 32 51(H)  Alk Phosphatase 38 - 126 U/L 68 66  Total Bilirubin 0.3 - 1.2 mg/dL 0.9 0.6     STUDIES: Dg Abdomen Acute W/chest  Result Date: 07/27/2019 CLINICAL DATA:  Abdominal pain, nausea and vomiting. EXAM: DG ABDOMEN ACUTE W/  1V CHEST COMPARISON:  CT scan 07/15/2019 FINDINGS: The upright chest x-ray demonstrates normal heart size. The mediastinal and hilar contours are normal. The lungs are clear. Two views of the abdomen demonstrate an unremarkable bowel gas pattern. Scattered air and stool noted throughout the colon and down into the rectum. No findings for small bowel obstruction or free air. The soft tissue shadows of the abdomen are maintained. No worrisome calcifications are identified. The bony structures are unremarkable. IMPRESSION: Unremarkable acute abdominal series. Electronically Signed   By: Marijo Sanes M.D.   On: 07/27/2019 04:58      Impression / Plan:   SANAAI DOANE is a 38 y.o. female with metabolic syndrome, poorly controlled diabetes, admitted with 1 week history of epigastric pain, nausea and vomiting.  No evidence of acute pancreatitis, LFTs normal.  Recent CT unremarkable  Continue clear liquid diet Increase Protonix 40 mg IV twice daily Recommend EGD with biopsies for further evaluation Okay with Reglan as needed Tight control of diabetes If EGD unremarkable, will try 2 weeks course of erythromycin for flareup of diabetic  gastroparesis  Thank you for involving me in the care of this patient.  We will follow along with you    LOS: 0 days   Sherri Sear, MD  07/27/2019, 5:42 PM   Note: This dictation was prepared with Dragon dictation along with smaller phrase technology. Any transcriptional errors that result from this process are unintentional.

## 2019-07-27 NOTE — ED Notes (Signed)
This Rn to bedside at this time. Pt resting in bed with NAD noted at this time. Respirations even and unlabored. Lights remain dimmed for patient comfort. Will wait to introduce self to patient until she awakens to encourage patient comfort and rest.

## 2019-07-27 NOTE — ED Notes (Addendum)
This RN to bedside, awoke patient who awakens easily. Pt noted to be resting in bed with NAD noted at this time. CBG obtained by this RN, maintenance fluids administered at this time. Pt is noted to be alert and oriented upon awakening. Pt denies any needs. Admitting MD Dr. Stark Jock made aware via secure chat that patient has not had active vomiting since this RN assumed care from previous shift and from this RN knowledged pt has not had active vomiting since PO challenge administered by Wells Guiles, RN.

## 2019-07-27 NOTE — H&P (Signed)
[facility] [place] NAME: Karen Ruiz    MR#:  [id number]  DATE OF BIRTH:  1981-02-03  DATE OF ADMISSION:  07/27/2019  PRIMARY CARE PHYSICIAN: Sofie Hartigan, MD   REQUESTING/REFERRING PHYSICIAN: Hinda Kehr  CHIEF COMPLAINT:   Chief Complaint  Patient presents with  . Abdominal Pain  Nausea and vomiting  HISTORY OF PRESENT ILLNESS:  Karen Ruiz  is a 38 y.o. female with a known history of diabetes mellitus, diabetic gastroparesis and migraine headaches who was recently discharged from the hospital on 07/16/2019 after management of symptoms nausea and vomiting attributed to acute gastritis who presented back to the emergency room last night with complaints of nausea and vomiting and abdominal pains.  Abdominal pain located in the epigastric as well as lower abdomen and appears to radiate up towards the chest.  Not associated with meals.  Occasionally worse in the mornings.  Patient reports having EGD done about 6 to 7 years ago.  No fevers.  No diarrhea.  No chest pain.  No recent sick contacts.  Patient had a recent CT abdomen and pelvis during recent admission which was negative for any acute findings.  Abdominal KUB done last night was negative.  Due to persistence of abdominal pains, medical service called to admit patient.  At the time of my evaluation patient resting comfortably not in any pain at this time.  Being admitted for further evaluation.  PAST MEDICAL HISTORY:   Past Medical History:  Diagnosis Date  . Barrett esophagus   . Gastroparesis   . Migraines   . MRSA (methicillin resistant staph aureus) culture positive    2009  . Obesity     PAST SURGICAL HISTORY:  History reviewed. No pertinent surgical history.  SOCIAL HISTORY:   Social History   Tobacco Use  . Smoking status: Current Every Day Smoker    Packs/day: 0.50    Types: Cigarettes  . Smokeless tobacco: Never Used  Substance Use  Topics  . Alcohol use: Yes    Comment: socially    FAMILY HISTORY:   Family History  Problem Relation Age of Onset  . Diabetes Mother   . Cancer Mother   . Heart failure Mother   . Hypertension Mother   . Heart attack Father   . Diabetes Father   . Hypertension Father     DRUG ALLERGIES:  No Known Allergies  REVIEW OF SYSTEMS:   Review of Systems  Constitutional: Negative for chills and fever.  HENT: Negative for hearing loss and tinnitus.   Eyes: Negative for blurred vision and double vision.  Respiratory: Negative for cough and shortness of breath.   Cardiovascular: Negative for chest pain and palpitations.  Gastrointestinal: Positive for abdominal pain, nausea and vomiting. Negative for diarrhea and heartburn.  Genitourinary: Negative for dysuria and urgency.  Musculoskeletal: Negative for myalgias and neck pain.  Skin: Negative for itching and rash.  Neurological: Negative for dizziness and headaches.  Psychiatric/Behavioral: Negative for depression and hallucinations.    MEDICATIONS AT HOME:   Prior to Admission medications   Medication Sig Start Date End Date Taking? Authorizing Provider  amLODipine (NORVASC) 5 MG tablet Take 1 tablet (5 mg total) by mouth daily. 07/17/19   Epifanio Lesches, MD  insulin aspart (NOVOLOG) 100 UNIT/ML injection Inject 0-9 Units into the skin 3 (three) times daily with meals. 07/16/19   Epifanio Lesches, MD  insulin aspart (NOVOLOG) 100 UNIT/ML injection Inject 0-5 Units into the  skin at bedtime. 07/16/19   Katha Hamming, MD  metFORMIN (GLUCOPHAGE) 500 MG tablet Take 1 tablet (500 mg total) by mouth 2 (two) times daily with a meal. 07/16/19 07/15/20  Katha Hamming, MD  omeprazole (PRILOSEC) 20 MG capsule Take 1 capsule (20 mg total) by mouth daily. 07/16/19 07/15/20  Katha Hamming, MD  ondansetron (ZOFRAN ODT) 4 MG disintegrating tablet Allow 1-2 tablets to dissolve in your mouth every 8 hours as needed for  nausea/vomiting 07/27/19   Loleta Rose, MD  promethazine (PHENERGAN) 12.5 MG tablet Take 1 tablet (12.5 mg total) by mouth every 6 (six) hours as needed for nausea or vomiting. 07/15/19   Chesley Noon, MD  sucralfate (CARAFATE) 1 g tablet Take 1 tablet (1 g total) by mouth 4 (four) times daily as needed (for abdominal discomfort, nausea, and/or vomiting). 07/27/19   Loleta Rose, MD      VITAL SIGNS:  Blood pressure 120/77, pulse 94, temperature 97.9 F (36.6 C), temperature source Oral, resp. rate 16, height 5\' 7"  (1.702 m), weight 113.4 kg, last menstrual period 07/14/2019, SpO2 97 %.  PHYSICAL EXAMINATION:  Physical Exam  GENERAL:  38 y.o.-year-old patient lying in the bed with no acute distress.  EYES: Pupils equal, round, reactive to light and accommodation. No scleral icterus. Extraocular muscles intact.  HEENT: Head atraumatic, normocephalic. Oropharynx and nasopharynx clear.  NECK:  Supple, no jugular venous distention. No thyroid enlargement, no tenderness.  LUNGS: Normal breath sounds bilaterally, no wheezing, rales,rhonchi or crepitation. No use of accessory muscles of respiration.  CARDIOVASCULAR: S1, S2 normal. No murmurs, rubs, or gallops.  ABDOMEN: Soft, nontender, nondistended. Bowel sounds present. No organomegaly or mass.  EXTREMITIES: No pedal edema, cyanosis, or clubbing.  NEUROLOGIC: Cranial nerves II through XII are intact. Muscle strength 5/5 in all extremities. Sensation intact. Gait not checked.  PSYCHIATRIC: The patient is alert and oriented x 3.  SKIN: No obvious rash, lesion, or ulcer.   LABORATORY PANEL:   CBC Recent Labs  Lab 07/26/19 2234  WBC 11.5*  HGB 15.5*  HCT 45.4  PLT 330   ------------------------------------------------------------------------------------------------------------------  Chemistries  Recent Labs  Lab 07/26/19 2234  NA 136  K 4.0  CL 103  CO2 20*  GLUCOSE 268*  BUN 14  CREATININE 0.58  CALCIUM 9.2  AST 23   ALT 32  ALKPHOS 68  BILITOT 0.9   ------------------------------------------------------------------------------------------------------------------  Cardiac Enzymes No results for input(s): TROPONINI in the last 168 hours. ------------------------------------------------------------------------------------------------------------------  RADIOLOGY:  Dg Abdomen Acute W/chest  Result Date: 07/27/2019 CLINICAL DATA:  Abdominal pain, nausea and vomiting. EXAM: DG ABDOMEN ACUTE W/ 1V CHEST COMPARISON:  CT scan 07/15/2019 FINDINGS: The upright chest x-ray demonstrates normal heart size. The mediastinal and hilar contours are normal. The lungs are clear. Two views of the abdomen demonstrate an unremarkable bowel gas pattern. Scattered air and stool noted throughout the colon and down into the rectum. No findings for small bowel obstruction or free air. The soft tissue shadows of the abdomen are maintained. No worrisome calcifications are identified. The bony structures are unremarkable. IMPRESSION: Unremarkable acute abdominal series. Electronically Signed   By: 07/17/2019 M.D.   On: 07/27/2019 04:58      IMPRESSION AND PLAN:  Patient is a 38 year old female with history of diabetes mellitus, diabetic gastroparesis and migraine headaches being admitted with nausea, vomiting and abdominal pains  1.  Acute gastritis Differential diagnoses include diabetic gastroparesis. Patient had recent negative CT abdomen and pelvis.  KUB overnight negative. Requested  for gastroenterologist input to determine if EGD is indicated. IV fluid hydration.  Antiemetics with Zofran. Placed on IV Reglan.  Placed on IV PPI If persistence or recurrence of symptoms will request for repeat CT abdomen and pelvis. Placed on clear liquid diet with plans to advance as tolerated. Urine drug screen positive for marijuana  2.  Diabetes mellitus type 2 Holding off on metformin. Placed on sliding scale insulin coverage.  Recent glycosylated hemoglobin level of 10.0 on 07/15/2019.  3.  History of migraine headaches Stable and controlled  4.  Morbid obesity with BMI of 39.16 Encourage lifestyle modification including weight loss on discharge  5.  Tobacco abuse Patient smokes less than half a pack of cigarettes per day.  Smoking cessation counseling done.  Patient motivated to quit.  Declined nicotine patch.  DVT prophylaxis; Lovenox  All the records are reviewed and case discussed with ED provider. Management plans discussed with the patient, and she is in agreement.  CODE STATUS: Full code  TOTAL TIME TAKING CARE OF THIS PATIENT: 53 minutes.    Danyeal Akens M.D on 07/27/2019 at 8:28 AM  Between 7am to 6pm - Pager - [phone]  After 6pm go to [email or web address] - Social research officer, governmentpassword [email or web address]  [facility]  Office  [phone]  CC: Primary care physician; Marina GoodellFeldpausch, Dale E, MD   Note: This dictation was prepared with Dragon dictation along with smaller phrase technology. Any transcriptional errors that result from this process are unintentional.

## 2019-07-27 NOTE — ED Notes (Signed)
Pt's visitor in lobby, raising voice and arguing over visitor procedure at this time. Per RN caring for pt no visitors at this time. Pt's visitor asking to speak with charge rn.

## 2019-07-27 NOTE — ED Notes (Signed)
Pt given water at this time 

## 2019-07-27 NOTE — ED Notes (Signed)
Charge RN spoke with visitor and visitor being verbally aggressive at this time. Security and [employer] involved and speaking with visitor.

## 2019-07-27 NOTE — ED Notes (Signed)
Medications administered per MD order. Pt given pillow and 2 warm blankets. Pt alert and oriented. Denies further needs. Will continue to monitor for further patient needs.

## 2019-07-27 NOTE — Discharge Instructions (Addendum)
As we discussed, your medical workup was reassuring tonight, and there is no evidence you have a condition that requires you to stay in the hospital.   Please consider avoiding all use of marijuana, because this can lead to a condition called cannabinoid hyperemesis syndrome, which has all the symptoms you are experiencing.  Drink plenty of fluids and stick to a bland diet and follow up with your regular doctor at the next available opportunity.

## 2019-07-28 ENCOUNTER — Observation Stay: Payer: Medicaid Other | Admitting: Anesthesiology

## 2019-07-28 ENCOUNTER — Encounter
Admission: EM | Disposition: A | Discharge status: Home / Self Care | Payer: Self-pay | Source: Home / Self Care | Attending: Emergency Medicine

## 2019-07-28 ENCOUNTER — Observation Stay: Payer: Medicaid Other

## 2019-07-28 DIAGNOSIS — K228 Other specified diseases of esophagus: Secondary | ICD-10-CM

## 2019-07-28 DIAGNOSIS — R1013 Epigastric pain: Secondary | ICD-10-CM

## 2019-07-28 DIAGNOSIS — R112 Nausea with vomiting, unspecified: Secondary | ICD-10-CM

## 2019-07-28 HISTORY — PX: ESOPHAGOGASTRODUODENOSCOPY: SHX5428

## 2019-07-28 LAB — MAGNESIUM: Magnesium: 1.8 mg/dL (ref 1.7–2.4)

## 2019-07-28 LAB — BASIC METABOLIC PANEL
Anion gap: 6 (ref 5–15)
BUN: 12 mg/dL (ref 6–20)
CO2: 23 mmol/L (ref 22–32)
Calcium: 8 mg/dL — ABNORMAL LOW (ref 8.9–10.3)
Chloride: 108 mmol/L (ref 98–111)
Creatinine, Ser: 0.46 mg/dL (ref 0.44–1.00)
GFR calc Af Amer: >60 mL/min (ref >60)
GFR calc non Af Amer: >60 mL/min (ref >60)
Glucose, Bld: 139 mg/dL — ABNORMAL HIGH (ref 70–99)
Potassium: 3.5 mmol/L (ref 3.5–5.1)
Sodium: 137 mmol/L (ref 135–145)

## 2019-07-28 LAB — GLUCOSE, CAPILLARY
Glucose-Capillary: 165 mg/dL — ABNORMAL HIGH (ref 70–99)
Glucose-Capillary: 137 mg/dL — ABNORMAL HIGH (ref 70–99)
Glucose-Capillary: 197 mg/dL — ABNORMAL HIGH (ref 70–99)
Glucose-Capillary: 170 mg/dL — ABNORMAL HIGH (ref 70–99)

## 2019-07-28 LAB — CBC
HCT: 38.4 % (ref 36.0–46.0)
Hemoglobin: 12.6 g/dL (ref 12.0–15.0)
MCH: 29.6 pg (ref 26.0–34.0)
MCHC: 32.8 g/dL (ref 30.0–36.0)
MCV: 90.4 fL (ref 80.0–100.0)
Platelets: 217 10*3/uL (ref 150–400)
RBC: 4.25 MIL/uL (ref 3.87–5.11)
RDW: 13.7 % (ref 11.5–15.5)
WBC: 7.5 10*3/uL (ref 4.0–10.5)
nRBC: 0 % (ref 0.0–0.2)

## 2019-07-28 LAB — PHOSPHORUS: Phosphorus: 3.3 mg/dL (ref 2.5–4.6)

## 2019-07-28 SURGERY — EGD (ESOPHAGOGASTRODUODENOSCOPY)
Anesthesia: General

## 2019-07-28 MED ORDER — MORPHINE SULFATE (PF) 2 MG/ML IV SOLN
2.0000 mg | Freq: Once | INTRAVENOUS | Status: AC
  Administered 2019-07-28: 15:00:00 2 mg via INTRAVENOUS
  Filled 2019-07-28: qty 1

## 2019-07-28 MED ORDER — PROPOFOL 500 MG/50ML IV EMUL
INTRAVENOUS | Status: DC | PRN
  Administered 2019-07-28: 100 ug/kg/min via INTRAVENOUS

## 2019-07-28 MED ORDER — PROPOFOL 500 MG/50ML IV EMUL
INTRAVENOUS | Status: AC
  Filled 2019-07-28: qty 50

## 2019-07-28 MED ORDER — MORPHINE SULFATE (PF) 2 MG/ML IV SOLN
INTRAVENOUS | Status: AC
  Administered 2019-07-28: 18:00:00 2 mg via INTRAVENOUS
  Filled 2019-07-28: qty 1

## 2019-07-28 MED ORDER — LIDOCAINE HCL (CARDIAC) PF 100 MG/5ML IV SOSY
PREFILLED_SYRINGE | INTRAVENOUS | Status: DC | PRN
  Administered 2019-07-28: 40 mg via INTRAVENOUS

## 2019-07-28 MED ORDER — MORPHINE SULFATE (PF) 2 MG/ML IV SOLN
2.0000 mg | Freq: Once | INTRAVENOUS | Status: AC
  Administered 2019-07-28: 18:00:00 2 mg via INTRAVENOUS

## 2019-07-28 MED ORDER — LIDOCAINE HCL (PF) 2 % IJ SOLN
INTRAMUSCULAR | Status: AC
  Filled 2019-07-28: qty 10

## 2019-07-28 MED ORDER — IOHEXOL 9 MG/ML PO SOLN: 500.0000 mL | ORAL | Status: AC

## 2019-07-28 MED ORDER — METOCLOPRAMIDE HCL 5 MG PO TABS: 5.0000 mg | ORAL_TABLET | Freq: Three times a day (TID) | ORAL | 0 refills | Status: DC | PRN

## 2019-07-28 MED ORDER — ACETAMINOPHEN 325 MG PO TABS: 650.0000 mg | ORAL_TABLET | Freq: Four times a day (QID) | ORAL | Status: DC | PRN

## 2019-07-28 MED ORDER — MORPHINE SULFATE (PF) 2 MG/ML IV SOLN
2.0000 mg | INTRAVENOUS | Status: DC | PRN
  Administered 2019-07-28: 2 mg via INTRAVENOUS
  Filled 2019-07-28: qty 1

## 2019-07-28 MED ORDER — PROPOFOL 10 MG/ML IV BOLUS
INTRAVENOUS | Status: DC | PRN
  Administered 2019-07-28: 50 mg via INTRAVENOUS
  Administered 2019-07-28: 30 mg via INTRAVENOUS
  Administered 2019-07-28: 50 mg via INTRAVENOUS
  Administered 2019-07-28: 20 mg via INTRAVENOUS

## 2019-07-28 MED ORDER — SODIUM CHLORIDE 0.9 % IV SOLN: INTRAVENOUS | Status: DC

## 2019-07-28 NOTE — Anesthesia Post-op Follow-up Note (Signed)
Anesthesia QCDR form completed.        

## 2019-07-28 NOTE — Discharge Summary (Signed)
[facility] [place] [facility]   PATIENT NAME: Karen Ruiz    MR#:  [id number]  DATE OF BIRTH:  December 31, 1980  DATE OF ADMISSION:  07/27/2019   ADMITTING PHYSICIAN: Jarell Mcewen, MD  DATE OF DISCHARGE: 07/28/2019  PRIMARY CARE PHYSICIAN: Marina Goodell, MD   ADMISSION DIAGNOSIS:  Diabetic gastroparesis (HCC) [E11.43, K31.84] Cyclic vomiting syndrome [R11.15] Cannabinoid hyperemesis syndrome [R11.2, F12.90] Nausea and vomiting, intractability of vomiting not specified, unspecified vomiting type [R11.2] DISCHARGE DIAGNOSIS:  Active Problems:   Acute gastritis  SECONDARY DIAGNOSIS:   Past Medical History:  Diagnosis Date  . Barrett esophagus   . Gastroparesis   . Migraines   . MRSA (methicillin resistant staph aureus) culture positive    2009  . Obesity    HOSPITAL COURSE:  Chief complaint; abdominal pain.  Nausea and vomiting.  History of presenting complaint; Karen Ruiz  is a 38 y.o. female with a known history of diabetes mellitus, diabetic gastroparesis and migraine headaches who was recently discharged from the hospital on 07/16/2019 after management of symptoms nausea and vomiting attributed to acute gastritis who presented back to the emergency room with complaints of nausea and vomiting and abdominal pains.  No fevers.  No diarrhea.  No chest pain.  No recent sick contacts.  Patient had a recent CT abdomen and pelvis during recent admission which was negative for any acute findings.  Abdominal KUB done last night was negative.  Patient was admitted to medical service for further evaluation  Hospital course; 1.   Probable diabetic gastroparesis flareup Patient presented with nausea and vomiting and abdominal pains.  Was continued on PPI.  Started on IV Reglan.  Differential diagnosis included acute gastritis rule out peptic ulcer disease.  Patient seen by gastroenterologist Dr. Allegra Lai and had EGD done this morning which was normal.   Dr. Allegra Lai recommended discharging patient on Reglan 5 mg p.o. 3 times daily as needed.  To follow-up with Dr. Allegra Lai as outpatient in 4 weeks. Patient had recent negative CT abdomen and pelvis.  KUB overnight negative.  Patient has remained asymptomatic.  Since admission no more nausea vomiting or abdominal pains.  Tolerating diet.  Clinically and hemodynamically stable for discharge home.  Continue PPI as previously. Urine drug screen positive for marijuana  2.  Diabetes mellitus type 2 Resume home diabetic regimen.  Outpatient follow-up with primary care physician for monitoring of blood sugars. Recent glycosylated hemoglobin level of 10.0 on 07/15/2019.  3.  History of migraine headaches Stable and controlled  4.  Morbid obesity with BMI of 39.16 Encourage lifestyle modification including weight loss on discharge  5.  Tobacco abuse Patient smokes less than half a pack of cigarettes per day.  Smoking cessation counseling done.  Patient motivated to quit.  Declined nicotine patch.  DISCHARGE CONDITIONS:  Stable CONSULTS OBTAINED:  Treatment Team:  Toney Reil, MD DRUG ALLERGIES:  No Known Allergies DISCHARGE MEDICATIONS:   Allergies as of 07/28/2019   No Known Allergies     Medication List    STOP taking these medications   promethazine 12.5 MG tablet Commonly known as: PHENERGAN     TAKE these medications   amLODipine 5 MG tablet Commonly known as: NORVASC Take 1 tablet (5 mg total) by mouth daily.   insulin aspart 100 UNIT/ML injection Commonly known as: novoLOG Inject 0-9 Units into the skin 3 (three) times daily with meals.   insulin aspart 100 UNIT/ML injection Commonly known as: novoLOG Inject  0-5 Units into the skin at bedtime.   metFORMIN 500 MG tablet Commonly known as: Glucophage Take 1 tablet (500 mg total) by mouth 2 (two) times daily with a meal.   metoCLOPramide 5 MG tablet Commonly known as: Reglan Take 1 tablet (5 mg total) by mouth 3  (three) times daily as needed for nausea.   omeprazole 20 MG capsule Commonly known as: PriLOSEC Take 1 capsule (20 mg total) by mouth daily.   ondansetron 4 MG disintegrating tablet Commonly known as: Zofran ODT Allow 1-2 tablets to dissolve in your mouth every 8 hours as needed for nausea/vomiting What changed:   how much to take  how to take this  when to take this  reasons to take this  additional instructions   sucralfate 1 g tablet Commonly known as: Carafate Take 1 tablet (1 g total) by mouth 4 (four) times daily as needed (for abdominal discomfort, nausea, and/or vomiting).        DISCHARGE INSTRUCTIONS:   DIET:  Diabetic diet DISCHARGE CONDITION:  Stable ACTIVITY:  Activity as tolerated OXYGEN:  Home Oxygen: No.  Oxygen Delivery: room air DISCHARGE LOCATION:  home   If you experience worsening of your admission symptoms, develop shortness of breath, life threatening emergency, suicidal or homicidal thoughts you must seek medical attention immediately by calling 911 or calling your MD immediately  if symptoms less severe.  You Must read complete instructions/literature along with all the possible adverse reactions/side effects for all the Medicines you take and that have been prescribed to you. Take any new Medicines after you have completely understood and accpet all the possible adverse reactions/side effects.   Please note  You were cared for by a hospitalist during your hospital stay. If you have any questions about your discharge medications or the care you received while you were in the hospital after you are discharged, you can call the unit and asked to speak with the hospitalist on call if the hospitalist that took care of you is not available. Once you are discharged, your primary care physician will handle any further medical issues. Please note that NO REFILLS for any discharge medications will be authorized once you are discharged, as it is  imperative that you return to your primary care physician (or establish a relationship with a primary care physician if you do not have one) for your aftercare needs so that they can reassess your need for medications and monitor your lab values.    On the day of Discharge:  VITAL SIGNS:  Blood pressure 138/76, pulse 64, temperature (!) 97 F (36.1 C), temperature source Tympanic, resp. rate 13, height 5\' 7"  (1.702 m), weight 113.4 kg, last menstrual period 07/14/2019, SpO2 100 %. PHYSICAL EXAMINATION:  GENERAL:  38 y.o.-year-old patient lying in the bed with no acute distress.  EYES: Pupils equal, round, reactive to light and accommodation. No scleral icterus. Extraocular muscles intact.  HEENT: Head atraumatic, normocephalic. Oropharynx and nasopharynx clear.  NECK:  Supple, no jugular venous distention. No thyroid enlargement, no tenderness.  LUNGS: Normal breath sounds bilaterally, no wheezing, rales,rhonchi or crepitation. No use of accessory muscles of respiration.  CARDIOVASCULAR: S1, S2 normal. No murmurs, rubs, or gallops.  ABDOMEN: Soft, non-tender, non-distended. Bowel sounds present. No organomegaly or mass.  EXTREMITIES: No pedal edema, cyanosis, or clubbing.  NEUROLOGIC: Cranial nerves II through XII are intact. Muscle strength 5/5 in all extremities. Sensation intact. Gait not checked.  PSYCHIATRIC: The patient is alert and oriented x 3.  SKIN: No obvious rash, lesion, or ulcer.  DATA REVIEW:   CBC Recent Labs  Lab 07/28/19 0356  WBC 7.5  HGB 12.6  HCT 38.4  PLT 217    Chemistries  Recent Labs  Lab 07/26/19 2234 07/28/19 0356  NA 136 137  K 4.0 3.5  CL 103 108  CO2 20* 23  GLUCOSE 268* 139*  BUN 14 12  CREATININE 0.58 0.46  CALCIUM 9.2 8.0*  MG  --  1.8  AST 23  --   ALT 32  --   ALKPHOS 68  --   BILITOT 0.9  --      Microbiology Results  Results for orders placed or performed during the hospital encounter of 07/27/19  SARS CORONAVIRUS 2 (TAT 6-24  HRS) Nasopharyngeal Nasopharyngeal Swab     Status: None   Collection Time: 07/27/19  7:05 AM   Specimen: Nasopharyngeal Swab  Result Value Ref Range Status   SARS Coronavirus 2 NEGATIVE NEGATIVE Final    Comment: (NOTE) SARS-CoV-2 target nucleic acids are NOT DETECTED. The SARS-CoV-2 RNA is generally detectable in upper and lower respiratory specimens during the acute phase of infection. Negative results do not preclude SARS-CoV-2 infection, do not rule out co-infections with other pathogens, and should not be used as the sole basis for treatment or other patient management decisions. Negative results must be combined with clinical observations, patient history, and epidemiological information. The expected result is Negative. Fact Sheet for Patients: SugarRoll.be Fact Sheet for Healthcare Providers: https://www.woods-mathews.com/ This test is not yet approved or cleared by the [place] FDA and  has been authorized for detection and/or diagnosis of SARS-CoV-2 by FDA under an Emergency Use Authorization (EUA). This EUA will remain  in effect (meaning this test can be used) for the duration of the COVID-19 declaration under Section 56 4(b)(1) of the Act, 21 U.S.C. section 360bbb-3(b)(1), unless the authorization is terminated or revoked sooner. Performed at [facility], [place] [address] [place], [place] [address]     RADIOLOGY:  No results found.   Management plans discussed with the patient, family and they are in agreement.  CODE STATUS: Full Code   TOTAL TIME TAKING CARE OF THIS PATIENT: 33 minutes.    Eulogio Requena M.D on 07/28/2019 at 11:44 AM  Between 7am to 6pm - Pager - [phone]  After 6pm go to [email or web address] - Proofreader  [facility]  Office  [phone]  CC: Primary care physician; Sofie Hartigan, MD   Note: This dictation was prepared with Dragon dictation along  with smaller phrase technology. Any transcriptional errors that result from this process are unintentional.

## 2019-07-28 NOTE — Anesthesia Postprocedure Evaluation (Signed)
Anesthesia Post Note  Patient: Karen Ruiz  Procedure(s) Performed: ESOPHAGOGASTRODUODENOSCOPY (EGD) (N/A )  Patient location during evaluation: PACU Anesthesia Type: General Level of consciousness: awake and alert Pain management: pain level controlled Vital Signs Assessment: post-procedure vital signs reviewed and stable Respiratory status: spontaneous breathing, nonlabored ventilation and respiratory function stable Cardiovascular status: blood pressure returned to baseline and stable Postop Assessment: no apparent nausea or vomiting Anesthetic complications: no     Last Vitals:  Vitals:   07/28/19 1200 07/28/19 1218  BP: 138/76 (!) 166/99  Pulse: 65 67  Resp:    Temp: 36.6 C   SpO2: 100% 99%    Last Pain:  Vitals:   07/28/19 1200  TempSrc: Oral  PainSc:                  Durenda Hurt

## 2019-07-28 NOTE — Progress Notes (Addendum)
Pt in tears and holding stomach c/o pain 10/10. Notified MD Oijie verbal to give Zofran and reglan at this time.  1359. Pt still tearful and stating pain 10/10 states she has 2-3 episodes of vomiting. Notified MD Oijie. See new orders. Pt is screaming out can hear from hallway, walking around in room, no tear stating she is in pain. Notified MD Ojie. State CT order however pt can not go because she ate food. informed to have her start drinking contrast at 1630.  1437- order changed to CT without oral or IV contrast. Pain medication giving to patient 1610 notified MD Oije of CT results IMPRESSION: No acute abnormality identified in the abdomen and pelvis. 15 Boyfriend at bedside wants to speak to MD and express Concern that "we can not find out what is wrong with her, and suggest we go back to school" Notified MD Oije. MD attempted to call room, phone busy. Pt is having episodes of emesis white and frothy. MD updated.  1 Dr sudini to bedside verbal order to give 1 time does of morphine and Zofran. Pt vomiting in room when administering 2mg  morphine1800 pt still screaming out in stating she is in pain. Pt had another episode of emesis. Notified MD Stark Jock and MD Sudini. See new orders for PRN. Dr. Darvin Neighbours states to give a dose now.

## 2019-07-28 NOTE — Anesthesia Preprocedure Evaluation (Addendum)
Anesthesia Evaluation  Patient identified by MRN, date of birth, ID band Patient awake    Reviewed: Allergy & Precautions, H&P , NPO status , Patient's Chart, lab work & pertinent test results  Airway Mallampati: II  TM Distance: >3 FB Neck ROM: full    Dental  (+) Teeth Intact   Pulmonary neg COPD, Current Smoker,           Cardiovascular (-) angina(-) Past MI negative cardio ROS  (-) dysrhythmias      Neuro/Psych  Headaches, negative psych ROS   GI/Hepatic negative GI ROS, Neg liver ROS,   Endo/Other  Morbid obesity  Renal/GU negative Renal ROS  negative genitourinary   Musculoskeletal   Abdominal   Peds  Hematology negative hematology ROS (+)   Anesthesia Other Findings Past Medical History: No date: Barrett esophagus No date: Gastroparesis No date: Migraines No date: MRSA (methicillin resistant staph aureus) culture positive     Comment:  2009 No date: Obesity  History reviewed. No pertinent surgical history.  BMI    Body Mass Index: 39.16 kg/m      Reproductive/Obstetrics negative OB ROS                            Anesthesia Physical Anesthesia Plan  ASA: II  Anesthesia Plan: General   Post-op Pain Management:    Induction:   PONV Risk Score and Plan: Propofol infusion and TIVA  Airway Management Planned: Nasal Cannula  Additional Equipment:   Intra-op Plan:   Post-operative Plan:   Informed Consent: I have reviewed the patients History and Physical, chart, labs and discussed the procedure including the risks, benefits and alternatives for the proposed anesthesia with the patient or authorized representative who has indicated his/her understanding and acceptance.     Dental Advisory Given  Plan Discussed with: Anesthesiologist, CRNA and Surgeon  Anesthesia Plan Comments:         Anesthesia Quick Evaluation

## 2019-07-28 NOTE — Transfer of Care (Signed)
Immediate Anesthesia Transfer of Care Note  Patient: Karen Ruiz  Procedure(s) Performed: ESOPHAGOGASTRODUODENOSCOPY (EGD) (N/A )  Patient Location: PACU and Endoscopy Unit  Anesthesia Type:General  Level of Consciousness: awake and drowsy  Airway & Oxygen Therapy: Patient Spontanous Breathing  Post-op Assessment: Report given to RN and Post -op Vital signs reviewed and stable  Post vital signs: Reviewed and stable  Last Vitals:  Vitals Value Taken Time  BP 125/80 07/28/19 1122  Temp 36.1 C 07/28/19 1119  Pulse 76 07/28/19 1122  Resp 15 07/28/19 1122  SpO2 95 % 07/28/19 1122  Vitals shown include unvalidated device data.  Last Pain:  Vitals:   07/28/19 1119  TempSrc: Tympanic  PainSc:          Complications: No apparent anesthesia complications

## 2019-07-28 NOTE — Op Note (Signed)
[facility]astroenterology Patient Name: Karen Ruiz Procedure Date: 07/28/2019 10:58 AM MRN: [id number] Account #: [id number] Date of Birth: 1981-06-06 Admit Type: Outpatient Age: 38 Room: [facility] ENDO ROOM 1 Gender: Female Note Status: Finalized Procedure:            Upper GI endoscopy Indications:          Epigastric abdominal pain, Nausea with vomiting Providers:            Lin Landsman MD, MD Medicines:            Monitored Anesthesia Care Complications:        No immediate complications. Estimated blood loss: None. Procedure:            Pre-Anesthesia Assessment:                       - Prior to the procedure, a History and Physical was                        performed, and patient medications and allergies were                        reviewed. The patient is competent. The risks and                        benefits of the procedure and the sedation options and                        risks were discussed with the patient. All questions                        were answered and informed consent was obtained.                        Patient identification and proposed procedure were                        verified by the physician, the nurse, the                        anesthesiologist, the anesthetist and the technician in                        the pre-procedure area in the procedure room in the                        endoscopy suite. Mental Status Examination: alert and                        oriented. Airway Examination: normal oropharyngeal                        airway and neck mobility. Respiratory Examination:                        clear to auscultation. CV Examination: normal.                        Prophylactic Antibiotics: The patient does not require  prophylactic antibiotics. Prior Anticoagulants: The                        patient has taken no previous anticoagulant or                        antiplatelet agents.  ASA Grade Assessment: II - A                        patient with mild systemic disease. After reviewing the                        risks and benefits, the patient was deemed in                        satisfactory condition to undergo the procedure. The                        anesthesia plan was to use monitored anesthesia care                        (MAC). Immediately prior to administration of                        medications, the patient was re-assessed for adequacy                        to receive sedatives. The heart rate, respiratory rate,                        oxygen saturations, blood pressure, adequacy of                        pulmonary ventilation, and response to care were                        monitored throughout the procedure. The physical status                        of the patient was re-assessed after the procedure.                       After obtaining informed consent, the endoscope was                        passed under direct vision. Throughout the procedure,                        the patient's blood pressure, pulse, and oxygen                        saturations were monitored continuously. The Endoscope                        was introduced through the mouth, and advanced to the                        second part of duodenum. The upper GI endoscopy was  accomplished without difficulty. The patient tolerated                        the procedure well. Findings:      The duodenal bulb and second portion of the duodenum were normal.      The entire examined stomach was normal. Biopsies were taken with a cold       forceps for Helicobacter pylori testing.      Esophagogastric landmarks were identified: the gastroesophageal junction       was found at 36 cm from the incisors.      The Z-line was irregular and was found 36 cm from the incisors.      One tongue of salmon-colored mucosa was present at 36 cm. No other       visible abnormalities  were present. The maximum longitudinal extent of       these esophageal mucosal changes was 0.9 cm in length. Biopsies were       taken with a cold forceps for histology.      Areas of ectopic gastric mucosa were found in the upper third of the       esophagus. Impression:           - Normal duodenal bulb and second portion of the                        duodenum.                       - Normal stomach. Biopsied.                       - Esophagogastric landmarks identified.                       - Z-line irregular, 36 cm from the incisors.                       - Salmon-colored mucosa suspicious for short-segment                        Barrett's esophagus. Biopsied.                       - The examination was otherwise normal. Recommendation:       - Await pathology results, if unremarkable, recommend                        gastric emptying study.                       - Return patient to hospital ward for ongoing care.                       - Advance diet as tolerated today.                       - Continue present medications.                       - Tight control of diabetes Procedure Code(s):    --- Professional ---                       [phone], Esophagogastroduodenoscopy,  flexible, transoral;                        with biopsy, single or multiple Diagnosis Code(s):    --- Professional ---                       R10.13, Epigastric pain                       R11.2, Nausea with vomiting, unspecified                       K22.8, Other specified diseases of esophagus CPT copyright 2019 American Medical Association. All rights reserved. The codes documented in this report are preliminary and upon coder review may  be revised to meet current compliance requirements. Dr. Ulyess Mort Lin Landsman MD, MD 07/28/2019 11:21:00 AM This report has been signed electronically. Number of Addenda: 0 Note Initiated On: 07/28/2019 10:58 AM Estimated Blood Loss: Estimated blood loss: none.       [facility]

## 2019-07-29 ENCOUNTER — Encounter: Payer: Self-pay | Admitting: Gastroenterology

## 2019-07-29 LAB — GLUCOSE, CAPILLARY: Glucose-Capillary: 143 mg/dL — ABNORMAL HIGH (ref 70–99)

## 2019-07-29 LAB — LACTIC ACID, PLASMA: Lactic Acid, Venous: 1 mmol/L (ref 0.5–1.9)

## 2019-07-29 LAB — SURGICAL PATHOLOGY

## 2019-07-29 MED ORDER — METOCLOPRAMIDE HCL 5 MG PO TABS: 5.0000 mg | ORAL_TABLET | Freq: Three times a day (TID) | ORAL | 0 refills | Status: DC | PRN

## 2019-07-29 NOTE — Progress Notes (Signed)
reviewed AVS 1 printed RX with AVS pt verbalized understanding

## 2019-07-29 NOTE — Discharge Summary (Signed)
[facility] NAME: Karen Ruiz    MR#:  [id number]  DATE OF BIRTH:  14-Feb-1981  DATE OF ADMISSION:  07/27/2019   ADMITTING PHYSICIAN: Orly Quimby, MD  DATE OF DISCHARGE: 07/29/2019  PRIMARY CARE PHYSICIAN: Sofie Hartigan, MD   ADMISSION DIAGNOSIS:  Diabetic gastroparesis ([place]) [F63.84, Y65.99] Cyclic vomiting syndrome [R11.15] Cannabinoid hyperemesis syndrome [R11.2, F12.90] Nausea and vomiting, intractability of vomiting not specified, unspecified vomiting type [R11.2] DISCHARGE DIAGNOSIS:  Active Problems:   Acute gastritis  SECONDARY DIAGNOSIS:   Past Medical History:  Diagnosis Date   Barrett esophagus    Gastroparesis    Migraines    MRSA (methicillin resistant staph aureus) culture positive    2009   Obesity    HOSPITAL COURSE:   Chief complaint; abdominal pain.  Nausea and vomiting.  History of presenting complaint; HeatherSturdivantis a38 y.o.femalewith a known history of diabetes mellitus,diabetic gastroparesis and migraine headaches who was recently discharged from the hospital on 07/16/2019 after management of symptomsnausea and vomiting attributed to acute gastritis who presented back to the emergency room with complaints of nausea and vomiting and abdominal pains. No fevers. No diarrhea. No chest pain. No recent sick contacts. Patient had a recent CT abdomen and pelvis during recent admission which was negative for any acute findings. Abdominal KUB done last night was negative.  Patient was admitted to medical service for further evaluation  Hospital course; 1.  Probable diabetic gastroparesis flareup Patient presented with nausea and vomiting and abdominal pains.  Was continued on PPI.  Started on IV Reglan. Patient seen by gastroenterologist Dr. Marius Ditch and had EGD done this admission which was normal.  Dr. Marius Ditch recommended discharging patient on Reglan 5 mg p.o. 3 times daily  as needed.  To follow-up with Dr. Marius Ditch as outpatient in 4 weeks.  After discharge was done yesterday patient with reported complaining of abdominal pain.  CT abdomen and pelvis was ordered which came back with no acute findings.  Discharge was canceled.  Patient reported to have been completely asymptomatic overnight.  Patient reported sleeping well during the night.  Denied any abdominal pains since yesterday evening.  No nausea or vomiting.  Tolerating diet well.  Had breakfast.  Lactic acid level was done which came back normal at 1.0 this morning.  As such no evidence of ischemic bowel.  Patient remains asymptomatic.  Clinically and hemodynamically stable for discharge home today.  Follow-up with gastroenterologist as outpatient as previously scheduled  Urine drug screen positive for marijuana  2.Diabetes mellitus type 2 Resume home diabetic regimen.  Outpatient follow-up with primary care physician for monitoring of blood sugars. Recent glycosylated hemoglobin level of 10.0 on 07/15/2019.  3.History of migraine headaches Stable and controlled  4.Morbid obesity with BMI of 39.16 Encourage lifestyle modification including weight loss on discharge  5.Tobacco abuse Patient smokes less than half a pack of cigarettes per day. Smoking cessation counseling done. Patient motivated to quit. Declined nicotine patch.  DISCHARGE CONDITIONS:  Stable CONSULTS OBTAINED:   DRUG ALLERGIES:  No Known Allergies DISCHARGE MEDICATIONS:   Allergies as of 07/29/2019   No Known Allergies     Medication List    STOP taking these medications   promethazine 12.5 MG tablet Commonly known as: PHENERGAN     TAKE these medications   amLODipine 5 MG tablet Commonly known as: NORVASC Take 1 tablet (5 mg total) by mouth daily.   insulin aspart 100 UNIT/ML injection Commonly known  as: novoLOG Inject 0-9 Units into the skin 3 (three) times daily with meals.   insulin aspart 100 UNIT/ML  injection Commonly known as: novoLOG Inject 0-5 Units into the skin at bedtime.   metFORMIN 500 MG tablet Commonly known as: Glucophage Take 1 tablet (500 mg total) by mouth 2 (two) times daily with a meal.   metoCLOPramide 5 MG tablet Commonly known as: Reglan Take 1 tablet (5 mg total) by mouth 3 (three) times daily as needed for nausea.   omeprazole 20 MG capsule Commonly known as: PriLOSEC Take 1 capsule (20 mg total) by mouth daily.   ondansetron 4 MG disintegrating tablet Commonly known as: Zofran ODT Allow 1-2 tablets to dissolve in your mouth every 8 hours as needed for nausea/vomiting What changed:   how much to take  how to take this  when to take this  reasons to take this  additional instructions   sucralfate 1 g tablet Commonly known as: Carafate Take 1 tablet (1 g total) by mouth 4 (four) times daily as needed (for abdominal discomfort, nausea, and/or vomiting).        DISCHARGE INSTRUCTIONS:   DIET:  Diabetic diet DISCHARGE CONDITION:  Stable ACTIVITY:  Activity as tolerated OXYGEN:  Home Oxygen: No.  Oxygen Delivery: room air DISCHARGE LOCATION:  home   If you experience worsening of your admission symptoms, develop shortness of breath, life threatening emergency, suicidal or homicidal thoughts you must seek medical attention immediately by calling 911 or calling your MD immediately  if symptoms less severe.  You Must read complete instructions/literature along with all the possible adverse reactions/side effects for all the Medicines you take and that have been prescribed to you. Take any new Medicines after you have completely understood and accpet all the possible adverse reactions/side effects.   Please note  You were cared for by a hospitalist during your hospital stay. If you have any questions about your discharge medications or the care you received while you were in the hospital after you are discharged, you can call the unit and  asked to speak with the hospitalist on call if the hospitalist that took care of you is not available. Once you are discharged, your primary care physician will handle any further medical issues. Please note that NO REFILLS for any discharge medications will be authorized once you are discharged, as it is imperative that you return to your primary care physician (or establish a relationship with a primary care physician if you do not have one) for your aftercare needs so that they can reassess your need for medications and monitor your lab values.    On the day of Discharge:  VITAL SIGNS:  Blood pressure 132/78, pulse 75, temperature 98.5 F (36.9 C), temperature source Oral, resp. rate 16, height 5\' 7"  (1.702 m), weight 113.4 kg, last menstrual period 07/14/2019, SpO2 95 %. PHYSICAL EXAMINATION:  GENERAL:  38 y.o.-year-old patient lying in the bed with no acute distress.  EYES: Pupils equal, round, reactive to light and accommodation. No scleral icterus. Extraocular muscles intact.  HEENT: Head atraumatic, normocephalic. Oropharynx and nasopharynx clear.  NECK:  Supple, no jugular venous distention. No thyroid enlargement, no tenderness.  LUNGS: Normal breath sounds bilaterally, no wheezing, rales,rhonchi or crepitation. No use of accessory muscles of respiration.  CARDIOVASCULAR: S1, S2 normal. No murmurs, rubs, or gallops.  ABDOMEN: Soft, non-tender, non-distended. Bowel sounds present. No organomegaly or mass.  EXTREMITIES: No pedal edema, cyanosis, or clubbing.  NEUROLOGIC: Cranial nerves II  through XII are intact. Muscle strength 5/5 in all extremities. Sensation intact. Gait not checked.  PSYCHIATRIC: The patient is alert and oriented x 3.  SKIN: No obvious rash, lesion, or ulcer.  DATA REVIEW:   CBC Recent Labs  Lab 07/28/19 0356  WBC 7.5  HGB 12.6  HCT 38.4  PLT 217    Chemistries  Recent Labs  Lab 07/26/19 2234 07/28/19 0356  NA 136 137  K 4.0 3.5  CL 103 108  CO2  20* 23  GLUCOSE 268* 139*  BUN 14 12  CREATININE 0.58 0.46  CALCIUM 9.2 8.0*  MG  --  1.8  AST 23  --   ALT 32  --   ALKPHOS 68  --   BILITOT 0.9  --      Microbiology Results  Results for orders placed or performed during the hospital encounter of 07/27/19  SARS CORONAVIRUS 2 (TAT 6-24 HRS) Nasopharyngeal Nasopharyngeal Swab     Status: None   Collection Time: 07/27/19  7:05 AM   Specimen: Nasopharyngeal Swab  Result Value Ref Range Status   SARS Coronavirus 2 NEGATIVE NEGATIVE Final    Comment: (NOTE) SARS-CoV-2 target nucleic acids are NOT DETECTED. The SARS-CoV-2 RNA is generally detectable in upper and lower respiratory specimens during the acute phase of infection. Negative results do not preclude SARS-CoV-2 infection, do not rule out co-infections with other pathogens, and should not be used as the sole basis for treatment or other patient management decisions. Negative results must be combined with clinical observations, patient history, and epidemiological information. The expected result is Negative. Fact Sheet for Patients: HairSlick.no Fact Sheet for Healthcare Providers: quierodirigir.com This test is not yet approved or cleared by the [place] FDA and  has been authorized for detection and/or diagnosis of SARS-CoV-2 by FDA under an Emergency Use Authorization (EUA). This EUA will remain  in effect (meaning this test can be used) for the duration of the COVID-19 declaration under Section 56 4(b)(1) of the Act, 21 U.S.C. section 360bbb-3(b)(1), unless the authorization is terminated or revoked sooner. Performed at [facility], [address] [place], [place] [address]     RADIOLOGY:  Ct Abdomen Pelvis Wo Contrast  Result Date: 07/28/2019 CLINICAL DATA:  Generalized abdomen pain. Patient status post upper endoscopy July 11, 2019. EXAM: CT ABDOMEN AND PELVIS WITHOUT CONTRAST TECHNIQUE:  Multidetector CT imaging of the abdomen and pelvis was performed following the standard protocol without IV contrast. COMPARISON:  July 15, 2019 FINDINGS: Lower chest: No acute abnormality. Hepatobiliary: Diffuse low density of liver is identified without focal liver lesion. The gallbladder is normal. The biliary tree is normal. Pancreas: Unremarkable. No pancreatic ductal dilatation or surrounding inflammatory changes. Spleen: Normal in size without focal abnormality. Adrenals/Urinary Tract: Adrenal glands are unremarkable. Kidneys are normal, without renal calculi, focal lesion, or hydronephrosis. Bladder is unremarkable. Stomach/Bowel: Small hiatal hernia is identified. The stomach is otherwise normal. There is no small bowel obstruction or diverticulitis. The appendix is normal. Vascular/Lymphatic: Aortic atherosclerosis. No enlarged abdominal or pelvic lymph nodes. Reproductive: Uterus and bilateral adnexa are unremarkable. Other: Small umbilical herniation of mesenteric fat is identified. Musculoskeletal: Degenerative joint changes of the spine are noted. IMPRESSION: No acute abnormality identified in the abdomen and pelvis. Fatty infiltration of liver. Electronically Signed   By: Sherian Rein M.D.   On: 07/28/2019 15:18     Management plans discussed with the patient, family and they are in agreement.  CODE STATUS: Full Code  TOTAL TIME TAKING CARE OF THIS PATIENT: 37 minutes.    Alizabeth Antonio M.D on 07/29/2019 at 9:35 AM  Between 7am to 6pm - Pager - [phone]  After 6pm go to [email or web address] - Social research officer, government  [facility]  Office  [phone]  CC: Primary care physician; Marina Goodell, MD   Note: This dictation was prepared with Dragon dictation along with smaller phrase technology. Any transcriptional errors that result from this process are unintentional.

## 2019-09-01 ENCOUNTER — Encounter: Payer: Self-pay | Admitting: Gastroenterology

## 2019-09-01 ENCOUNTER — Ambulatory Visit: Payer: Self-pay | Admitting: Gastroenterology

## 2019-09-01 DIAGNOSIS — R112 Nausea with vomiting, unspecified: Secondary | ICD-10-CM

## 2019-10-09 ENCOUNTER — Telehealth: Payer: Self-pay | Admitting: Pharmacy Technician

## 2019-10-09 NOTE — Telephone Encounter (Signed)
Patient failed to provide requested 2020 financial documentation. No additional medication assistance will be provided by [facility] without the required proof of income documentation. Patient notified by letter.    [facility]av[facility] K[facility]man [facility]  

## 2021-03-26 ENCOUNTER — Other Ambulatory Visit: Payer: Self-pay

## 2021-03-26 ENCOUNTER — Emergency Department
Admission: EM | Admit: 2021-03-26 | Discharge: 2021-03-26 | Disposition: A | Discharge status: Home / Self Care | Payer: Medicaid Other | Attending: Emergency Medicine | Admitting: Emergency Medicine

## 2021-03-26 ENCOUNTER — Encounter: Payer: Self-pay | Admitting: Emergency Medicine

## 2021-03-26 DIAGNOSIS — F17210 Nicotine dependence, cigarettes, uncomplicated: Secondary | ICD-10-CM | POA: Insufficient documentation

## 2021-03-26 DIAGNOSIS — E119 Type 2 diabetes mellitus without complications: Secondary | ICD-10-CM | POA: Diagnosis not present

## 2021-03-26 DIAGNOSIS — F12188 Cannabis abuse with other cannabis-induced disorder: Secondary | ICD-10-CM | POA: Diagnosis not present

## 2021-03-26 DIAGNOSIS — Z794 Long term (current) use of insulin: Secondary | ICD-10-CM | POA: Diagnosis not present

## 2021-03-26 DIAGNOSIS — R197 Diarrhea, unspecified: Secondary | ICD-10-CM | POA: Insufficient documentation

## 2021-03-26 DIAGNOSIS — R0789 Other chest pain: Secondary | ICD-10-CM | POA: Insufficient documentation

## 2021-03-26 DIAGNOSIS — Z7984 Long term (current) use of oral hypoglycemic drugs: Secondary | ICD-10-CM | POA: Diagnosis not present

## 2021-03-26 DIAGNOSIS — Z79899 Other long term (current) drug therapy: Secondary | ICD-10-CM | POA: Diagnosis not present

## 2021-03-26 DIAGNOSIS — R112 Nausea with vomiting, unspecified: Secondary | ICD-10-CM | POA: Diagnosis present

## 2021-03-26 DIAGNOSIS — I10 Essential (primary) hypertension: Secondary | ICD-10-CM | POA: Insufficient documentation

## 2021-03-26 DIAGNOSIS — R42 Dizziness and giddiness: Secondary | ICD-10-CM | POA: Insufficient documentation

## 2021-03-26 DIAGNOSIS — R1013 Epigastric pain: Secondary | ICD-10-CM | POA: Diagnosis not present

## 2021-03-26 LAB — COMPREHENSIVE METABOLIC PANEL
ALT: 29 U/L (ref 0–44)
AST: 25 U/L (ref 15–41)
Albumin: 4.1 g/dL (ref 3.5–5.0)
Alkaline Phosphatase: 69 U/L (ref 38–126)
Anion gap: 12 (ref 5–15)
BUN: 12 mg/dL (ref 6–20)
CO2: 21 mmol/L — ABNORMAL LOW (ref 22–32)
Calcium: 9.1 mg/dL (ref 8.9–10.3)
Chloride: 102 mmol/L (ref 98–111)
Creatinine, Ser: 0.56 mg/dL (ref 0.44–1.00)
GFR, Estimated: >60 mL/min (ref >60)
Glucose, Bld: 296 mg/dL — ABNORMAL HIGH (ref 70–99)
Potassium: 3.6 mmol/L (ref 3.5–5.1)
Sodium: 135 mmol/L (ref 135–145)
Total Bilirubin: 0.9 mg/dL (ref 0.3–1.2)
Total Protein: 7.7 g/dL (ref 6.5–8.1)

## 2021-03-26 LAB — CBC
HCT: 43.7 % (ref 36.0–46.0)
Hemoglobin: 15.4 g/dL — ABNORMAL HIGH (ref 12.0–15.0)
MCH: 28.9 pg (ref 26.0–34.0)
MCHC: 35.2 g/dL (ref 30.0–36.0)
MCV: 82.1 fL (ref 80.0–100.0)
Platelets: 334 10*3/uL (ref 150–400)
RBC: 5.32 MIL/uL — ABNORMAL HIGH (ref 3.87–5.11)
RDW: 14.2 % (ref 11.5–15.5)
WBC: 17 10*3/uL — ABNORMAL HIGH (ref 4.0–10.5)
nRBC: 0 % (ref 0.0–0.2)

## 2021-03-26 LAB — LIPASE, BLOOD: Lipase: 24 U/L (ref 11–51)

## 2021-03-26 MED ORDER — LACTATED RINGERS IV BOLUS
1000.0000 mL | Freq: Once | INTRAVENOUS | Status: AC
  Administered 2021-03-26: 1000 mL via INTRAVENOUS

## 2021-03-26 MED ORDER — METOCLOPRAMIDE HCL 10 MG PO TABS: 10.0000 mg | ORAL_TABLET | Freq: Three times a day (TID) | ORAL | 1 refills | Status: DC | PRN

## 2021-03-26 MED ORDER — PANTOPRAZOLE SODIUM 40 MG IV SOLR
40.0000 mg | Freq: Once | INTRAVENOUS | Status: AC
  Administered 2021-03-26: 40 mg via INTRAVENOUS
  Filled 2021-03-26: qty 40

## 2021-03-26 MED ORDER — METOCLOPRAMIDE HCL 5 MG/ML IJ SOLN
10.0000 mg | Freq: Once | INTRAMUSCULAR | Status: AC
  Administered 2021-03-26: 10 mg via INTRAVENOUS
  Filled 2021-03-26: qty 2

## 2021-03-26 MED ORDER — DROPERIDOL 2.5 MG/ML IJ SOLN
2.5000 mg | Freq: Once | INTRAMUSCULAR | Status: AC
  Administered 2021-03-26: 2.5 mg via INTRAVENOUS
  Filled 2021-03-26: qty 2

## 2021-03-26 MED ORDER — ONDANSETRON 4 MG PO TBDP
4.0000 mg | ORAL_TABLET | Freq: Once | ORAL | Status: AC | PRN
  Administered 2021-03-26: 4 mg via ORAL
  Filled 2021-03-26: qty 1

## 2021-03-26 NOTE — ED Provider Notes (Signed)
[facility] Emergency Department Provider Note ____________________________________________   Event Date/Time   First MD Initiated Contact with Patient 03/26/21 [phone]     (approximate)  I have reviewed the triage vital signs and the nursing notes.  HISTORY  Chief Complaint Chest Pain, Abdominal Pain, and Emesis   HPI Karen Ruiz is a 40 y.o. femalewho presents to the ED for evaluation of   Chart review indicates history obesity, diabetic gastroparesis and cyclic vomiting syndrome.  History of cannabis abuse and cannabis hyperemesis syndrome.  Patient presents to the ED for evaluation of recurrent emesis with associated epigastric and lower chest discomfort.  She reports symptoms started yesterday morning and have been persistent throughout the past 24 hours.  She reports "dozens" of episodes of nonbloody nonbilious emesis.  She reports increasing pain that she has had these emesis episodes, particular to her bilateral flanks.  Denies fevers or syncope, but does report some presyncopal lightheaded dizziness.  She reports that she smokes "not really a lot" of cannabis.  Further reports smoking half PPD cigarettes.  Past Medical History:  Diagnosis Date   Barrett esophagus    Gastroparesis    Migraines    MRSA (methicillin resistant staph aureus) culture positive    2009   Obesity     Patient Active Problem List   Diagnosis Date Noted   Acute gastritis 07/27/2019   Intractable nausea and vomiting 07/15/2019   Dependent edema 03/05/2019   Nummular eczematous dermatitis 03/05/2019   Tobacco abuse 09/06/2016   Diabetes type 2, uncontrolled (HCC) 09/06/2016   Barrett esophagus 03/28/2012   Hypertension 01/10/2012   Eustachian tube dysfunction 07/06/2003    Past Surgical History:  Procedure Laterality Date   ESOPHAGOGASTRODUODENOSCOPY N/A 07/28/2019   Procedure: ESOPHAGOGASTRODUODENOSCOPY (EGD);  Surgeon: Toney Reil, MD;  Location:  [facility] ENDOSCOPY;  Service: Gastroenterology;  Laterality: N/A;    Prior to Admission medications   Medication Sig Start Date End Date Taking? Authorizing Provider  metoCLOPramide (REGLAN) 10 MG tablet Take 1 tablet (10 mg total) by mouth every 8 (eight) hours as needed for nausea or vomiting. 03/26/21 03/26/22 Yes Delton Prairie, MD  amLODipine (NORVASC) 5 MG tablet Take 1 tablet (5 mg total) by mouth daily. Patient not taking: Reported on 07/27/2019 07/17/19   Katha Hamming, MD  insulin aspart (NOVOLOG) 100 UNIT/ML injection Inject 0-9 Units into the skin 3 (three) times daily with meals. 07/16/19   Katha Hamming, MD  insulin aspart (NOVOLOG) 100 UNIT/ML injection Inject 0-5 Units into the skin at bedtime. 07/16/19   Katha Hamming, MD  metFORMIN (GLUCOPHAGE) 500 MG tablet Take 1 tablet (500 mg total) by mouth 2 (two) times daily with a meal. 07/16/19 07/15/20  Katha Hamming, MD  metoCLOPramide (REGLAN) 5 MG tablet Take 1 tablet (5 mg total) by mouth 3 (three) times daily as needed for nausea. 07/29/19 07/28/20  Enid Baas Jude, MD  omeprazole (PRILOSEC) 20 MG capsule Take 1 capsule (20 mg total) by mouth daily. 07/16/19 07/15/20  Katha Hamming, MD  ondansetron (ZOFRAN ODT) 4 MG disintegrating tablet Allow 1-2 tablets to dissolve in your mouth every 8 hours as needed for nausea/vomiting 07/27/19   Loleta Rose, MD  sucralfate (CARAFATE) 1 g tablet Take 1 tablet (1 g total) by mouth 4 (four) times daily as needed (for abdominal discomfort, nausea, and/or vomiting). 07/27/19   Loleta Rose, MD    Allergies Patient has no known allergies.  Family History  Problem Relation Age of Onset   Diabetes  Mother    Cancer Mother    Heart failure Mother    Hypertension Mother    Heart attack Father    Diabetes Father    Hypertension Father     Social History Social History   Tobacco Use   Smoking status: Every Day    Packs/day: 0.50    Pack years: 0.00    Types:  Cigarettes   Smokeless tobacco: Never  Substance Use Topics   Alcohol use: Yes    Comment: socially    Review of Systems  Constitutional: No fever/chills Eyes: No visual changes. ENT: No sore throat. Cardiovascular: Denies chest pain. Respiratory: Denies shortness of breath. Gastrointestinal: Positive for abdominal and flank pain, nausea and vomiting currently.  No diarrhea.  No constipation. Genitourinary: Negative for dysuria. Musculoskeletal: Negative for back pain. Skin: Negative for rash. Neurological: Negative for headaches, focal weakness or numbness.  ____________________________________________   PHYSICAL EXAM:  VITAL SIGNS: Vitals:   03/26/21 1230 03/26/21 1235  BP:  (!) 121/91  Pulse: 84 93  Resp: (!) 25 (!) 28  Temp:    SpO2: 99% 97%     Constitutional: Alert and oriented.  Obese.  Sitting up in bed, frequently rocking and appears uncomfortable. Eyes: Conjunctivae are normal. PERRL. EOMI. Head: Atraumatic. Nose: No congestion/rhinnorhea. Mouth/Throat: Mucous membranes are moist.  Oropharynx non-erythematous. Neck: No stridor. No cervical spine tenderness to palpation. Cardiovascular: Normal rate, regular rhythm. Grossly normal heart sounds.  Good peripheral circulation. Respiratory: Normal respiratory effort.  No retractions. Lungs CTAB. Gastrointestinal: Soft , nondistended. No CVA tenderness. Diffuse tenderness without localizing or peritoneal features. Musculoskeletal: No lower extremity tenderness nor edema.  No joint effusions. No signs of acute trauma. Neurologic:  Normal speech and language. No gross focal neurologic deficits are appreciated. No gait instability noted. Skin:  Skin is warm, dry and intact. No rash noted. Psychiatric: Mood and affect are normal. Speech and behavior are normal.  ____________________________________________   LABS (all labs ordered are listed, but only abnormal results are displayed)  Labs Reviewed   COMPREHENSIVE METABOLIC PANEL - Abnormal; Notable for the following components:      Result Value   CO2 21 (*)    Glucose, Bld 296 (*)    All other components within normal limits  CBC - Abnormal; Notable for the following components:   WBC 17.0 (*)    RBC 5.32 (*)    Hemoglobin 15.4 (*)    All other components within normal limits  LIPASE, BLOOD  URINALYSIS, COMPLETE (UACMP) WITH MICROSCOPIC   ____________________________________________  12 Lead EKG  Sinus rhythm, rate of 95 bpm.  Normal axis and intervals.  No STEMI. ____________________________________________  RADIOLOGY  ED MD interpretation:    Official radiology report(s): No results found.  ____________________________________________   PROCEDURES and INTERVENTIONS  Procedure(s) performed (including Critical Care):  .1-3 Lead EKG Interpretation  Date/Time: 03/26/2021 1:00 PM Performed by: Delton Prairie, MD Authorized by: Delton Prairie, MD     Interpretation: normal     ECG rate:  92   ECG rate assessment: normal     Rhythm: sinus rhythm     Ectopy: none     Conduction: normal    Medications  ondansetron (ZOFRAN-ODT) disintegrating tablet 4 mg (4 mg Oral Given 03/26/21 0723)  lactated ringers bolus 1,000 mL (0 mLs Intravenous Stopped 03/26/21 1239)  droperidol (INAPSINE) 2.5 MG/ML injection 2.5 mg (2.5 mg Intravenous Given 03/26/21 0816)  pantoprazole (PROTONIX) injection 40 mg (40 mg Intravenous Given 03/26/21 0818)  metoCLOPramide (  REGLAN) injection 10 mg (10 mg Intravenous Given 03/26/21 0816)    ____________________________________________   MDM / ED COURSE   Obese 40 year old woman with history of gastroparesis and cannabis hyperemesis syndrome presents to the ED with recurrent emesis, ultimately amenable to outpatient management after getting control of her symptoms.  Initially she appears quite uncomfortable, rocking in bed and tender throughout her abdomen with voluntary guarding making examination  difficult.  Blood work with nonspecific leukocytosis, likely reactionary, otherwise no anion gap or evidence of DKA/HHS.  Provided Reglan to treat her gastroparesis, droperidol to assist in management of cannabis hyperemesis syndrome, Protonix due to her history of ulcers as well as a liter of lactated Ringer's.  After this, she has resolution of symptoms and reports feeling much better.  She tolerates p.o. intake and is suitable for outpatient management.  I provide her with a prescription for Reglan and we discussed cessation from cannabis as well as the importance of adherence to her medication regimen.  We discussed return precautions for the ED prior to discharge.  Clinical Course as of 03/26/21 1300  Sun Mar 26, 2021  0831 Reassessed after medication administration.  Patient now calmly laying in bed and looks much improved. [DS]  1010 Reassessed.  Patient reports feeling better. [DS]  1237 Reassessed. Her HR has improved to the 70s [DS]    Clinical Course User Index [DS] Delton Prairie, MD    ____________________________________________   FINAL CLINICAL IMPRESSION(S) / ED DIAGNOSES  Final diagnoses:  Nausea vomiting and diarrhea  Cannabis hyperemesis syndrome concurrent with and due to cannabis abuse [facility])     ED Discharge Orders          Ordered    metoCLOPramide (REGLAN) 10 MG tablet  Every 8 hours PRN        03/26/21 1115             Jerimiah Wolman Katrinka Blazing   Note:  This document was prepared using Dragon voice recognition software and may include unintentional dictation errors.    Delton Prairie, MD 03/26/21 [phone]

## 2021-03-26 NOTE — ED Notes (Signed)
Provided graham crackers, gingerale, applesauce for PO challenge.

## 2021-03-26 NOTE — Discharge Instructions (Addendum)
Use Reglan for nausea, vomiting abdominal pain.  Return to the ED with any worsening symptoms.

## 2021-03-26 NOTE — ED Notes (Signed)
Pt laying back in bed now, appears at ease. States feels better.

## 2021-03-26 NOTE — ED Triage Notes (Signed)
Pt in via EMS with c/o abd pain and vomiting.  CBG 308

## 2021-03-26 NOTE — ED Notes (Signed)
Pt tolerated PO challenge. Drank gingerale, ate 1 pack graham crackers.

## 2021-03-26 NOTE — ED Triage Notes (Signed)
Pt reports CP, abd pain and vomiting for 2 days. Pt moving around continuously in her chair and yelling out.

## 2021-03-26 NOTE — ED Notes (Signed)
EDP at bedside talking with pt. Discussing discharge. Pt states has not taken insulin and metformin past 2-3d due to abdominal pain. EDP explaining to pt that must continue to take meds and explained prescription for reglan.

## 2021-03-26 NOTE — ED Notes (Signed)
EDP aware of patient discomfort. No new orders at this time.

## 2021-03-26 NOTE — ED Notes (Signed)
EDP at bedside  

## 2021-03-26 NOTE — ED Notes (Signed)
Pt c/o severe abdominal pain in whole abdomen that started 2d ago and got worse last night. C/O NVD. Pt is writhing in bed and unable to stay still. Meds have been given and IVF is infusing.

## 2021-05-02 ENCOUNTER — Inpatient Hospital Stay (HOSPITAL_COMMUNITY)
Admit: 2021-05-02 | Discharge: 2021-05-02 | Disposition: A | Discharge status: Home / Self Care | Payer: Medicaid Other | Attending: Internal Medicine | Admitting: Internal Medicine

## 2021-05-02 ENCOUNTER — Other Ambulatory Visit: Payer: Self-pay

## 2021-05-02 ENCOUNTER — Encounter
Admission: EM | Disposition: A | Discharge status: Home / Self Care | Payer: Self-pay | Source: Home / Self Care | Attending: Internal Medicine

## 2021-05-02 ENCOUNTER — Inpatient Hospital Stay
Admission: EM | Admit: 2021-05-02 | Discharge: 2021-05-03 | DRG: 247 | Disposition: A | Discharge status: Home / Self Care | Payer: Medicaid Other | Attending: Internal Medicine | Admitting: Internal Medicine

## 2021-05-02 ENCOUNTER — Emergency Department: Payer: Medicaid Other

## 2021-05-02 DIAGNOSIS — E669 Obesity, unspecified: Secondary | ICD-10-CM | POA: Diagnosis present

## 2021-05-02 DIAGNOSIS — Z720 Tobacco use: Secondary | ICD-10-CM | POA: Diagnosis not present

## 2021-05-02 DIAGNOSIS — F17210 Nicotine dependence, cigarettes, uncomplicated: Secondary | ICD-10-CM | POA: Diagnosis present

## 2021-05-02 DIAGNOSIS — R21 Rash and other nonspecific skin eruption: Secondary | ICD-10-CM | POA: Diagnosis present

## 2021-05-02 DIAGNOSIS — Z20822 Contact with and (suspected) exposure to covid-19: Secondary | ICD-10-CM | POA: Diagnosis present

## 2021-05-02 DIAGNOSIS — I2510 Atherosclerotic heart disease of native coronary artery without angina pectoris: Secondary | ICD-10-CM | POA: Diagnosis present

## 2021-05-02 DIAGNOSIS — Z8614 Personal history of Methicillin resistant Staphylococcus aureus infection: Secondary | ICD-10-CM | POA: Diagnosis not present

## 2021-05-02 DIAGNOSIS — Z833 Family history of diabetes mellitus: Secondary | ICD-10-CM | POA: Diagnosis not present

## 2021-05-02 DIAGNOSIS — E1143 Type 2 diabetes mellitus with diabetic autonomic (poly)neuropathy: Secondary | ICD-10-CM | POA: Diagnosis present

## 2021-05-02 DIAGNOSIS — E1165 Type 2 diabetes mellitus with hyperglycemia: Secondary | ICD-10-CM

## 2021-05-02 DIAGNOSIS — I10 Essential (primary) hypertension: Secondary | ICD-10-CM | POA: Diagnosis present

## 2021-05-02 DIAGNOSIS — I252 Old myocardial infarction: Secondary | ICD-10-CM | POA: Insufficient documentation

## 2021-05-02 DIAGNOSIS — K2270 Barrett's esophagus without dysplasia: Secondary | ICD-10-CM | POA: Diagnosis present

## 2021-05-02 DIAGNOSIS — I214 Non-ST elevation (NSTEMI) myocardial infarction: Principal | ICD-10-CM | POA: Diagnosis present

## 2021-05-02 DIAGNOSIS — IMO0002 Reserved for concepts with insufficient information to code with codable children: Secondary | ICD-10-CM | POA: Diagnosis present

## 2021-05-02 DIAGNOSIS — I213 ST elevation (STEMI) myocardial infarction of unspecified site: Secondary | ICD-10-CM

## 2021-05-02 DIAGNOSIS — Z8249 Family history of ischemic heart disease and other diseases of the circulatory system: Secondary | ICD-10-CM | POA: Diagnosis not present

## 2021-05-02 DIAGNOSIS — E119 Type 2 diabetes mellitus without complications: Secondary | ICD-10-CM | POA: Diagnosis present

## 2021-05-02 DIAGNOSIS — R079 Chest pain, unspecified: Secondary | ICD-10-CM | POA: Diagnosis not present

## 2021-05-02 DIAGNOSIS — I77810 Thoracic aortic ectasia: Secondary | ICD-10-CM | POA: Diagnosis present

## 2021-05-02 DIAGNOSIS — Z794 Long term (current) use of insulin: Secondary | ICD-10-CM | POA: Diagnosis not present

## 2021-05-02 DIAGNOSIS — Z6838 Body mass index (BMI) 38.0-38.9, adult: Secondary | ICD-10-CM | POA: Diagnosis not present

## 2021-05-02 DIAGNOSIS — E118 Type 2 diabetes mellitus with unspecified complications: Secondary | ICD-10-CM

## 2021-05-02 DIAGNOSIS — E785 Hyperlipidemia, unspecified: Secondary | ICD-10-CM | POA: Diagnosis present

## 2021-05-02 DIAGNOSIS — E1169 Type 2 diabetes mellitus with other specified complication: Secondary | ICD-10-CM | POA: Diagnosis present

## 2021-05-02 DIAGNOSIS — K3184 Gastroparesis: Secondary | ICD-10-CM | POA: Diagnosis present

## 2021-05-02 HISTORY — DX: ST elevation (STEMI) myocardial infarction of unspecified site: I21.3

## 2021-05-02 HISTORY — PX: LEFT HEART CATH AND CORONARY ANGIOGRAPHY: CATH118249

## 2021-05-02 HISTORY — DX: Type 2 diabetes mellitus without complications: E11.9

## 2021-05-02 HISTORY — PX: INTRAVASCULAR ULTRASOUND/IVUS: CATH118244

## 2021-05-02 HISTORY — PX: CORONARY STENT INTERVENTION: CATH118234

## 2021-05-02 LAB — HEPATIC FUNCTION PANEL
ALT: 35 U/L (ref 0–44)
AST: 22 U/L (ref 15–41)
Albumin: 3.5 g/dL (ref 3.5–5.0)
Alkaline Phosphatase: 74 U/L (ref 38–126)
Bilirubin, Direct: <0.1 mg/dL (ref 0.0–0.2)
Total Bilirubin: 0.8 mg/dL (ref 0.3–1.2)
Total Protein: 6.9 g/dL (ref 6.5–8.1)

## 2021-05-02 LAB — BASIC METABOLIC PANEL
Anion gap: 9 (ref 5–15)
BUN: 14 mg/dL (ref 6–20)
CO2: 20 mmol/L — ABNORMAL LOW (ref 22–32)
Calcium: 8.8 mg/dL — ABNORMAL LOW (ref 8.9–10.3)
Chloride: 101 mmol/L (ref 98–111)
Creatinine, Ser: 0.73 mg/dL (ref 0.44–1.00)
GFR, Estimated: >60 mL/min (ref >60)
Glucose, Bld: 324 mg/dL — ABNORMAL HIGH (ref 70–99)
Potassium: 4.4 mmol/L (ref 3.5–5.1)
Sodium: 130 mmol/L — ABNORMAL LOW (ref 135–145)

## 2021-05-02 LAB — CBC
HCT: 48.4 % — ABNORMAL HIGH (ref 36.0–46.0)
Hemoglobin: 16.9 g/dL — ABNORMAL HIGH (ref 12.0–15.0)
MCH: 29.2 pg (ref 26.0–34.0)
MCHC: 34.9 g/dL (ref 30.0–36.0)
MCV: 83.6 fL (ref 80.0–100.0)
Platelets: 398 10*3/uL (ref 150–400)
RBC: 5.79 MIL/uL — ABNORMAL HIGH (ref 3.87–5.11)
RDW: 14.6 % (ref 11.5–15.5)
WBC: 20.1 10*3/uL — ABNORMAL HIGH (ref 4.0–10.5)
nRBC: 0 % (ref 0.0–0.2)

## 2021-05-02 LAB — PROTIME-INR
INR: 1 (ref 0.8–1.2)
Prothrombin Time: 13.2 seconds (ref 11.4–15.2)

## 2021-05-02 LAB — TROPONIN I (HIGH SENSITIVITY)
Troponin I (High Sensitivity): 224 ng/L (ref <18)
Troponin I (High Sensitivity): 233 ng/L (ref <18)
Troponin I (High Sensitivity): 349 ng/L (ref <18)

## 2021-05-02 LAB — LIPID PANEL
Cholesterol: 215 mg/dL — ABNORMAL HIGH (ref 0–200)
HDL: 49 mg/dL (ref >40)
LDL Cholesterol: 139 mg/dL — ABNORMAL HIGH (ref 0–99)
Total CHOL/HDL Ratio: 4.4 RATIO
Triglycerides: 137 mg/dL (ref <150)
VLDL: 27 mg/dL (ref 0–40)

## 2021-05-02 LAB — CBG MONITORING, ED
Glucose-Capillary: 387 mg/dL — ABNORMAL HIGH (ref 70–99)
Glucose-Capillary: 321 mg/dL — ABNORMAL HIGH (ref 70–99)

## 2021-05-02 LAB — RESP PANEL BY RT-PCR (FLU A&B, COVID) ARPGX2
Influenza A by PCR: NEGATIVE
Influenza B by PCR: NEGATIVE
SARS Coronavirus 2 by RT PCR: NEGATIVE

## 2021-05-02 LAB — POCT ACTIVATED CLOTTING TIME
Activated Clotting Time: 283 seconds
Activated Clotting Time: 294 seconds

## 2021-05-02 LAB — GLUCOSE, CAPILLARY
Glucose-Capillary: 305 mg/dL — ABNORMAL HIGH (ref 70–99)
Glucose-Capillary: 273 mg/dL — ABNORMAL HIGH (ref 70–99)
Glucose-Capillary: 323 mg/dL — ABNORMAL HIGH (ref 70–99)
Glucose-Capillary: 234 mg/dL — ABNORMAL HIGH (ref 70–99)

## 2021-05-02 LAB — HEPARIN LEVEL (UNFRACTIONATED): Heparin Unfractionated: <0.1 IU/mL — ABNORMAL LOW (ref 0.30–0.70)

## 2021-05-02 LAB — TSH: TSH: 1.471 u[IU]/mL (ref 0.350–4.500)

## 2021-05-02 LAB — C-REACTIVE PROTEIN: CRP: 1.4 mg/dL — ABNORMAL HIGH (ref <1.0)

## 2021-05-02 LAB — HEMOGLOBIN A1C
Hgb A1c MFr Bld: 10 % — ABNORMAL HIGH (ref 4.8–5.6)
Mean Plasma Glucose: 240.3 mg/dL

## 2021-05-02 LAB — APTT: aPTT: 26 seconds (ref 24–36)

## 2021-05-02 LAB — POC URINE PREG, ED: Preg Test, Ur: NEGATIVE

## 2021-05-02 LAB — SEDIMENTATION RATE: Sed Rate: 2 mm/hr (ref 0–20)

## 2021-05-02 LAB — HIV ANTIBODY (ROUTINE TESTING W REFLEX): HIV Screen 4th Generation wRfx: NONREACTIVE

## 2021-05-02 SURGERY — LEFT HEART CATH AND CORONARY ANGIOGRAPHY
Anesthesia: Moderate Sedation

## 2021-05-02 MED ORDER — IOHEXOL 300 MG/ML  SOLN
INTRAMUSCULAR | Status: DC | PRN
  Administered 2021-05-02: 110 mL

## 2021-05-02 MED ORDER — HEPARIN (PORCINE) 25000 UT/250ML-% IV SOLN
1400.0000 [IU]/h | INTRAVENOUS | Status: DC
  Administered 2021-05-02: 1100 [IU]/h via INTRAVENOUS
  Filled 2021-05-02: qty 250

## 2021-05-02 MED ORDER — HEPARIN (PORCINE) IN NACL 1000-0.9 UT/500ML-% IV SOLN
INTRAVENOUS | Status: DC | PRN
  Administered 2021-05-02: 1000 mL

## 2021-05-02 MED ORDER — LABETALOL HCL 5 MG/ML IV SOLN: 10.0000 mg | INTRAVENOUS | Status: AC | PRN

## 2021-05-02 MED ORDER — ONDANSETRON HCL 4 MG/2ML IJ SOLN
4.0000 mg | Freq: Once | INTRAMUSCULAR | Status: AC
  Administered 2021-05-02: 4 mg via INTRAVENOUS
  Filled 2021-05-02: qty 2

## 2021-05-02 MED ORDER — ZOLPIDEM TARTRATE 5 MG PO TABS
5.0000 mg | ORAL_TABLET | Freq: Once | ORAL | Status: AC
  Administered 2021-05-02: 5 mg via ORAL
  Filled 2021-05-02: qty 1

## 2021-05-02 MED ORDER — ASPIRIN 81 MG PO CHEW: 81.0000 mg | CHEWABLE_TABLET | ORAL | Status: DC

## 2021-05-02 MED ORDER — HEPARIN BOLUS VIA INFUSION
2500.0000 [IU] | Freq: Once | INTRAVENOUS | Status: AC
  Administered 2021-05-02: 2500 [IU] via INTRAVENOUS
  Filled 2021-05-02: qty 2500

## 2021-05-02 MED ORDER — METOPROLOL TARTRATE 25 MG PO TABS
25.0000 mg | ORAL_TABLET | Freq: Two times a day (BID) | ORAL | Status: DC
  Administered 2021-05-02: 25 mg via ORAL
  Filled 2021-05-02: qty 1

## 2021-05-02 MED ORDER — SODIUM CHLORIDE 0.9 % IV SOLN: 250.0000 mL | INTRAVENOUS | Status: DC | PRN

## 2021-05-02 MED ORDER — SODIUM CHLORIDE 0.9 % WEIGHT BASED INFUSION
3.0000 mL/kg/h | INTRAVENOUS | Status: AC
  Administered 2021-05-02: 3 mL/kg/h via INTRAVENOUS

## 2021-05-02 MED ORDER — MORPHINE SULFATE (PF) 2 MG/ML IV SOLN
2.0000 mg | INTRAVENOUS | Status: DC | PRN
  Administered 2021-05-02 – 2021-05-03 (×3): 2 mg via INTRAVENOUS
  Filled 2021-05-02 (×2): qty 1

## 2021-05-02 MED ORDER — MIDAZOLAM HCL 2 MG/2ML IJ SOLN
INTRAMUSCULAR | Status: AC
  Filled 2021-05-02: qty 2

## 2021-05-02 MED ORDER — HEPARIN SODIUM (PORCINE) 1000 UNIT/ML IJ SOLN
INTRAMUSCULAR | Status: DC | PRN
  Administered 2021-05-02: 6000 [IU] via INTRAVENOUS
  Administered 2021-05-02: 5000 [IU] via INTRAVENOUS
  Administered 2021-05-02: 2000 [IU] via INTRAVENOUS

## 2021-05-02 MED ORDER — HEPARIN SODIUM (PORCINE) 1000 UNIT/ML IJ SOLN
INTRAMUSCULAR | Status: AC
  Filled 2021-05-02: qty 1

## 2021-05-02 MED ORDER — TICAGRELOR 90 MG PO TABS
ORAL_TABLET | ORAL | Status: DC | PRN
  Administered 2021-05-02: 180 mg via ORAL

## 2021-05-02 MED ORDER — NITROGLYCERIN 1 MG/10 ML FOR IR/CATH LAB
INTRA_ARTERIAL | Status: AC
  Filled 2021-05-02: qty 10

## 2021-05-02 MED ORDER — INSULIN ASPART 100 UNIT/ML IJ SOLN
0.0000 [IU] | INTRAMUSCULAR | Status: DC
  Administered 2021-05-02: 3 [IU] via SUBCUTANEOUS
  Administered 2021-05-02 (×2): 7 [IU] via SUBCUTANEOUS
  Administered 2021-05-02: 9 [IU] via SUBCUTANEOUS
  Administered 2021-05-03: 7 [IU] via SUBCUTANEOUS
  Administered 2021-05-03: 5 [IU] via SUBCUTANEOUS
  Administered 2021-05-03: 7 [IU] via SUBCUTANEOUS
  Administered 2021-05-03: 3 [IU] via SUBCUTANEOUS
  Filled 2021-05-02 (×8): qty 1

## 2021-05-02 MED ORDER — NITROGLYCERIN IN D5W 200-5 MCG/ML-% IV SOLN
0.0000 ug/min | INTRAVENOUS | Status: DC
  Administered 2021-05-02: 3 ug/min via INTRAVENOUS
  Filled 2021-05-02: qty 250

## 2021-05-02 MED ORDER — SODIUM CHLORIDE 0.9 % WEIGHT BASED INFUSION: 1.0000 mL/kg/h | INTRAVENOUS | Status: DC

## 2021-05-02 MED ORDER — ENOXAPARIN SODIUM 60 MG/0.6ML IJ SOSY
0.5000 mg/kg | PREFILLED_SYRINGE | INTRAMUSCULAR | Status: DC
  Filled 2021-05-02: qty 0.55

## 2021-05-02 MED ORDER — TICAGRELOR 90 MG PO TABS
ORAL_TABLET | ORAL | Status: AC
  Filled 2021-05-02: qty 2

## 2021-05-02 MED ORDER — ALUM & MAG HYDROXIDE-SIMETH 200-200-20 MG/5ML PO SUSP
30.0000 mL | ORAL | Status: DC | PRN
  Administered 2021-05-02: 30 mL via ORAL
  Filled 2021-05-02: qty 30

## 2021-05-02 MED ORDER — MORPHINE SULFATE (PF) 2 MG/ML IV SOLN
INTRAVENOUS | Status: AC
  Filled 2021-05-02: qty 1

## 2021-05-02 MED ORDER — HYDROMORPHONE HCL 1 MG/ML IJ SOLN
1.0000 mg | Freq: Once | INTRAMUSCULAR | Status: AC
  Administered 2021-05-02: 1 mg via INTRAVENOUS
  Filled 2021-05-02: qty 1

## 2021-05-02 MED ORDER — PANTOPRAZOLE SODIUM 40 MG PO TBEC
40.0000 mg | DELAYED_RELEASE_TABLET | Freq: Every day | ORAL | Status: DC
  Administered 2021-05-02 – 2021-05-03 (×2): 40 mg via ORAL
  Filled 2021-05-02: qty 1

## 2021-05-02 MED ORDER — DIPHENHYDRAMINE HCL 25 MG PO CAPS
25.0000 mg | ORAL_CAPSULE | Freq: Four times a day (QID) | ORAL | Status: DC | PRN
  Administered 2021-05-02 – 2021-05-03 (×2): 25 mg via ORAL
  Filled 2021-05-02 (×3): qty 1

## 2021-05-02 MED ORDER — HYDRALAZINE HCL 20 MG/ML IJ SOLN: 10.0000 mg | INTRAMUSCULAR | Status: AC | PRN

## 2021-05-02 MED ORDER — MORPHINE SULFATE (PF) 4 MG/ML IV SOLN
4.0000 mg | Freq: Once | INTRAVENOUS | Status: AC
  Administered 2021-05-02: 4 mg via INTRAVENOUS
  Filled 2021-05-02: qty 1

## 2021-05-02 MED ORDER — SODIUM CHLORIDE 0.9% FLUSH: 3.0000 mL | INTRAVENOUS | Status: DC | PRN

## 2021-05-02 MED ORDER — ONDANSETRON HCL 4 MG/2ML IJ SOLN
INTRAMUSCULAR | Status: AC
  Administered 2021-05-02: 4 mg via INTRAVENOUS
  Filled 2021-05-02: qty 2

## 2021-05-02 MED ORDER — LACTATED RINGERS IV BOLUS
1000.0000 mL | Freq: Once | INTRAVENOUS | Status: AC
  Administered 2021-05-02: 1000 mL via INTRAVENOUS

## 2021-05-02 MED ORDER — IOHEXOL 350 MG/ML SOLN
100.0000 mL | Freq: Once | INTRAVENOUS | Status: AC | PRN
  Administered 2021-05-02: 100 mL via INTRAVENOUS

## 2021-05-02 MED ORDER — ENOXAPARIN SODIUM 40 MG/0.4ML IJ SOSY: 40.0000 mg | PREFILLED_SYRINGE | INTRAMUSCULAR | Status: DC

## 2021-05-02 MED ORDER — OXYCODONE HCL 5 MG PO TABS
5.0000 mg | ORAL_TABLET | Freq: Once | ORAL | Status: AC
  Administered 2021-05-02: 5 mg via ORAL

## 2021-05-02 MED ORDER — ATORVASTATIN CALCIUM 80 MG PO TABS
80.0000 mg | ORAL_TABLET | Freq: Every day | ORAL | Status: DC
  Administered 2021-05-02 – 2021-05-03 (×2): 80 mg via ORAL
  Filled 2021-05-02: qty 4
  Filled 2021-05-02: qty 1

## 2021-05-02 MED ORDER — SODIUM CHLORIDE 0.9 % IV SOLN: INTRAVENOUS | Status: AC

## 2021-05-02 MED ORDER — VERAPAMIL HCL 2.5 MG/ML IV SOLN
INTRAVENOUS | Status: DC | PRN
  Administered 2021-05-02: 2.5 mg via INTRA_ARTERIAL

## 2021-05-02 MED ORDER — SODIUM CHLORIDE 0.9% FLUSH
3.0000 mL | Freq: Two times a day (BID) | INTRAVENOUS | Status: DC
  Administered 2021-05-02 (×2): 3 mL via INTRAVENOUS

## 2021-05-02 MED ORDER — ASPIRIN EC 81 MG PO TBEC
81.0000 mg | DELAYED_RELEASE_TABLET | Freq: Every day | ORAL | Status: DC
  Administered 2021-05-03: 81 mg via ORAL
  Filled 2021-05-02: qty 1

## 2021-05-02 MED ORDER — INSULIN GLARGINE-YFGN 100 UNIT/ML ~~LOC~~ SOLN
10.0000 [IU] | Freq: Every day | SUBCUTANEOUS | Status: DC
  Administered 2021-05-03: 10 [IU] via SUBCUTANEOUS
  Filled 2021-05-02 (×2): qty 0.1

## 2021-05-02 MED ORDER — SODIUM CHLORIDE 0.9% FLUSH
3.0000 mL | Freq: Two times a day (BID) | INTRAVENOUS | Status: DC
  Administered 2021-05-02: 3 mL via INTRAVENOUS

## 2021-05-02 MED ORDER — ASPIRIN 81 MG PO CHEW
324.0000 mg | CHEWABLE_TABLET | Freq: Once | ORAL | Status: AC
  Administered 2021-05-02: 324 mg via ORAL
  Filled 2021-05-02: qty 4

## 2021-05-02 MED ORDER — FENTANYL CITRATE (PF) 100 MCG/2ML IJ SOLN
INTRAMUSCULAR | Status: DC | PRN
  Administered 2021-05-02 (×3): 25 ug via INTRAVENOUS

## 2021-05-02 MED ORDER — MIDAZOLAM HCL 2 MG/2ML IJ SOLN
INTRAMUSCULAR | Status: DC | PRN
  Administered 2021-05-02 (×2): 1 mg via INTRAVENOUS

## 2021-05-02 MED ORDER — LIDOCAINE HCL (PF) 1 % IJ SOLN
INTRAMUSCULAR | Status: DC | PRN
  Administered 2021-05-02: 2 mL

## 2021-05-02 MED ORDER — ACETAMINOPHEN 325 MG PO TABS
650.0000 mg | ORAL_TABLET | Freq: Four times a day (QID) | ORAL | Status: DC | PRN
  Administered 2021-05-02 – 2021-05-03 (×3): 650 mg via ORAL
  Filled 2021-05-02 (×2): qty 2

## 2021-05-02 MED ORDER — NITROGLYCERIN 0.4 MG SL SUBL
SUBLINGUAL_TABLET | SUBLINGUAL | Status: AC
  Administered 2021-05-02: 0.4 mg via SUBLINGUAL
  Filled 2021-05-02: qty 1

## 2021-05-02 MED ORDER — SODIUM CHLORIDE 0.9 % IV BOLUS
INTRAVENOUS | Status: AC | PRN
  Administered 2021-05-02: 250 mL via INTRAVENOUS

## 2021-05-02 MED ORDER — VERAPAMIL HCL 2.5 MG/ML IV SOLN
INTRAVENOUS | Status: AC
  Filled 2021-05-02: qty 2

## 2021-05-02 MED ORDER — HEPARIN BOLUS VIA INFUSION
4000.0000 [IU] | Freq: Once | INTRAVENOUS | Status: AC
  Administered 2021-05-02: 4000 [IU] via INTRAVENOUS
  Filled 2021-05-02: qty 4000

## 2021-05-02 MED ORDER — LIDOCAINE HCL (PF) 1 % IJ SOLN
INTRAMUSCULAR | Status: AC
  Filled 2021-05-02: qty 30

## 2021-05-02 MED ORDER — TICAGRELOR 90 MG PO TABS
90.0000 mg | ORAL_TABLET | Freq: Two times a day (BID) | ORAL | Status: DC
  Administered 2021-05-02 – 2021-05-03 (×2): 90 mg via ORAL
  Filled 2021-05-02 (×2): qty 1

## 2021-05-02 MED ORDER — HEPARIN (PORCINE) IN NACL 1000-0.9 UT/500ML-% IV SOLN
INTRAVENOUS | Status: AC
  Filled 2021-05-02: qty 1000

## 2021-05-02 MED ORDER — OXYCODONE HCL 5 MG PO TABS
ORAL_TABLET | ORAL | Status: AC
  Filled 2021-05-02: qty 1

## 2021-05-02 MED ORDER — METOPROLOL TARTRATE 50 MG PO TABS
50.0000 mg | ORAL_TABLET | Freq: Two times a day (BID) | ORAL | Status: DC
  Administered 2021-05-02 – 2021-05-03 (×2): 50 mg via ORAL
  Filled 2021-05-02 (×2): qty 1

## 2021-05-02 MED ORDER — FENTANYL CITRATE (PF) 100 MCG/2ML IJ SOLN
INTRAMUSCULAR | Status: AC
  Filled 2021-05-02: qty 2

## 2021-05-02 MED ORDER — NITROGLYCERIN 0.4 MG SL SUBL: 0.4000 mg | SUBLINGUAL_TABLET | SUBLINGUAL | Status: DC | PRN

## 2021-05-02 MED ORDER — DIPHENHYDRAMINE HCL 50 MG/ML IJ SOLN
50.0000 mg | Freq: Once | INTRAMUSCULAR | Status: AC
  Administered 2021-05-02: 50 mg via INTRAVENOUS
  Filled 2021-05-02: qty 1

## 2021-05-02 MED ORDER — ONDANSETRON HCL 4 MG/2ML IJ SOLN: 4.0000 mg | Freq: Four times a day (QID) | INTRAMUSCULAR | Status: DC | PRN

## 2021-05-02 MED ORDER — ACETAMINOPHEN 325 MG PO TABS
ORAL_TABLET | ORAL | Status: AC
  Filled 2021-05-02: qty 2

## 2021-05-02 MED ORDER — NITROGLYCERIN 1 MG/10 ML FOR IR/CATH LAB
INTRA_ARTERIAL | Status: DC | PRN
  Administered 2021-05-02 (×2): 200 ug via INTRACORONARY

## 2021-05-02 SURGICAL SUPPLY — 20 items
BALLN TREK RX 2.75X15 (BALLOONS) ×2
BALLN ~~LOC~~ TREK RX 3.5X20 (BALLOONS) ×2
BALLOON TREK RX 2.75X15 (BALLOONS) ×1 IMPLANT
BALLOON ~~LOC~~ TREK RX 3.5X20 (BALLOONS) ×1 IMPLANT
CATH 5F 110X4 TIG (CATHETERS) ×2 IMPLANT
CATH EAGLE EYE PLAT IMAGING (CATHETERS) ×2 IMPLANT
CATH INFINITI 5FR ANG PIGTAIL (CATHETERS) ×2 IMPLANT
CATH VISTA GUIDE 6FR JR4 (CATHETERS) ×2 IMPLANT
DEVICE RAD TR BAND REGULAR (VASCULAR PRODUCTS) ×2 IMPLANT
DRAPE BRACHIAL (DRAPES) ×2 IMPLANT
GLIDESHEATH SLEND SS 6F .021 (SHEATH) ×2 IMPLANT
KIT ENCORE 26 ADVANTAGE (KITS) ×2 IMPLANT
PACK CARDIAC CATH (CUSTOM PROCEDURE TRAY) ×2 IMPLANT
PANNUS RETENTION SYSTEM 2 PAD (MISCELLANEOUS) ×2 IMPLANT
PROTECTION STATION PRESSURIZED (MISCELLANEOUS) ×2
SET ATX SIMPLICITY (MISCELLANEOUS) ×2 IMPLANT
STATION PROTECTION PRESSURIZED (MISCELLANEOUS) ×1 IMPLANT
STENT ONYX FRONTIER 3.0X30 (Permanent Stent) ×2 IMPLANT
WIRE ROSEN-J .035X260CM (WIRE) ×2 IMPLANT
WIRE RUNTHROUGH .014X180CM (WIRE) ×2 IMPLANT

## 2021-05-02 NOTE — Plan of Care (Signed)
  Problem: Clinical Measurements: Goal: Ability to maintain clinical measurements within normal limits will improve Outcome: Progressing   Problem: Pain Managment: Goal: General experience of comfort will improve Outcome: Progressing   Problem: Safety: Goal: Ability to remain free from injury will improve Outcome: Progressing   

## 2021-05-02 NOTE — Progress Notes (Signed)
ANTICOAGULATION CONSULT NOTE - Initial Consult  Pharmacy Consult for Heparin  Indication: chest pain/ACS  No Known Allergies  Patient Measurements: Height: 5\' 6"  (167.6 cm) Weight: 108.9 kg (240 lb) IBW/kg (Calculated) : 59.3 Heparin Dosing Weight: 84.5 kg   Vital Signs: Temp: 98.7 F (37.1 C) (08/02 0024) Temp Source: Oral (08/02 0024) BP: 132/95 (08/02 0830) Pulse Rate: 78 (08/02 0830)  Labs: Recent Labs    05/02/21 0025 05/02/21 0134 05/02/21 0513 05/02/21 0802  HGB 16.9*  --   --   --   HCT 48.4*  --   --   --   PLT 398  --   --   --   APTT  --  26  --   --   LABPROT  --  13.2  --   --   INR  --  1.0  --   --   HEPARINUNFRC  --   --   --  <0.10*  CREATININE 0.73  --   --   --   TROPONINIHS 224*  --  233* 349*     Estimated Creatinine Clearance: 114.4 mL/min (by C-G formula based on SCr of 0.73 mg/dL).   Medical History: Past Medical History:  Diagnosis Date   Barrett esophagus    Diabetes mellitus without complication (HCC)    Gastroparesis    Migraines    MRSA (methicillin resistant staph aureus) culture positive    2009   Obesity     Medications:  (Not in a hospital admission)  Assessment: Pharmacy consulted to dose heparin in this 40 year old female admitted with ACS/NSTEMI.  No prior anticoag noted.  CrCl = 114.4 ml/min    Goal of Therapy:  Heparin level 0.3-0.7 units/ml Monitor platelets by anticoagulation protocol: Yes   Plan:  05/02/21@0802  HL<0.10, subtherapeutic Confirmed with nurse that heparin infusion was continually running and no occlusion was noted. Will rebolus 2500 units bolus x 1 Increase heparin infusion to 1400 units/hr Check anti-Xa level in 6 hours and daily while on heparin Continue to monitor H&H and platelets  Coralie Stanke A Irene Collings 05/02/2021,9:24 AM

## 2021-05-02 NOTE — Progress Notes (Signed)
Pt. Med. With 2 plain Tylenol 650 mg p.o. for c/o HA "7" on scale 1-10.

## 2021-05-02 NOTE — Progress Notes (Signed)
Pt. States nausea gone now; Headache "gone." I just have a little bit of mild burning in my chest now. Pt. Calm, conversive. Right radial clean, dry, intact. No acute distress at present.

## 2021-05-02 NOTE — Progress Notes (Signed)
Headache is "better": 5 on scale 1-10. States chest burns about a " 4" on scale 1-10. Nausea "almost gone now." Med. With one time Oxycodone 5 mg p.o. per MD orders. Pt. Nibbling on saltine crackers and sipping on ginerale (diet). Pt. Best friend at bedside conversing with pt.

## 2021-05-02 NOTE — H&P (Signed)
History and Physical    Karen Ruiz ZOX:[id number] DOB: [birth year] DOA: 05/02/2021  PCP: Marina GoodellFeldpausch, Dale E, MD  Patient coming from: Home.  Chief Complaint: Chest pain.  HPI: Karen Ruiz is a 40 y.o. female with history of diabetes mellitus type 2 and tobacco abuse presents to the ER with complaints of chest pain.  Patient has been having retrosternal chest pain radiating to the back for the last 2 days.  Pain has been constant.  No associated productive cough fever or chills.  Denies any shortness of breath.  Over the last 1 week patient has developed diffuse urticarial rash and had taken prednisone and yesterday was started on antibiotics since prednisone was not helping with the rash.  ED Course: In the ER patient initial EKG showed sinus tachycardia.  CT angiogram of the chest was negative for pulmonary embolism.  High sensitive troponin was 224 WBC count was 20,000.  Patient's symptoms were concerning for non-ST elevation MI and was started on heparin infusion aspirin beta-blockers statins admitted for further management.  COVID test was negative.  Review of Systems: As per HPI, rest all negative.   Past Medical History:  Diagnosis Date   Barrett esophagus    Diabetes mellitus without complication (HCC)    Gastroparesis    Migraines    MRSA (methicillin resistant staph aureus) culture positive    2009   Obesity     Past Surgical History:  Procedure Laterality Date   CESAREAN SECTION     ESOPHAGOGASTRODUODENOSCOPY N/A 07/28/2019   Procedure: ESOPHAGOGASTRODUODENOSCOPY (EGD);  Surgeon: Toney ReilVanga, Rohini Reddy, MD;  Location[facility] ENDOSCOPY;  Service: Gastroenterology;  Laterality: N/A;     reports that she has been smoking cigarettes. She has been smoking an average of .5 packs per day. She has never used smokeless tobacco. She reports current alcohol use. No history on file for drug use.  No Known Allergies  Family History  Problem Relation Age of Onset    Diabetes Mother    Cancer Mother    Heart failure Mother    Hypertension Mother    Heart attack Father    Diabetes Father    Hypertension Father     Prior to Admission medications   Medication Sig Start Date End Date Taking? Authorizing Provider  insulin aspart (NOVOLOG) 100 UNIT/ML injection Inject 0-9 Units into the skin 3 (three) times daily with meals. 07/16/19   Katha HammingKonidena, Snehalatha, MD  predniSONE (STERAPRED UNI-PAK 21 TAB) 10 MG (21) TBPK tablet Take 1 tablet by mouth as directed. Patient not taking: Reported on 05/02/2021 04/24/21   [provider]    Physical Exam: Constitutional: Moderately built and nourished. Vitals:   05/02/21 0024 05/02/21 0145 05/02/21 0200 05/02/21 0215  BP: (!) 147/105     Pulse: (!) 128 97 100 80  Resp: 20 (!) 22 (!) 22 12  Temp: 98.7 F (37.1 C)     TempSrc: Oral     SpO2: 98% 100% 100% 100%  Weight:      Height:       Eyes: Anicteric no pallor.   ENMT: No discharge from the ears eyes or mouth. Neck: No mass felt.  No neck rigidity. Respiratory: No rhonchi or crepitations. Cardiovascular: S1-S2 heard. Abdomen: Soft nontender bowel sound present. Musculoskeletal: No edema. Skin: Diffuse urticarial skin rash. Neurologic: Alert awake oriented to time place and person.  Moves all extremities. Psychiatric: Appears normal.  Normal affect.   Labs on Admission: I have personally reviewed following labs  and imaging studies  CBC: Recent Labs  Lab 05/02/21 0025  WBC 20.1*  HGB 16.9*  HCT 48.4*  MCV 83.6  PLT 398   Basic Metabolic Panel: Recent Labs  Lab 05/02/21 0025  NA 130*  K 4.4  CL 101  CO2 20*  GLUCOSE 324*  BUN 14  CREATININE 0.73  CALCIUM 8.8*   GFR: Estimated Creatinine Clearance: 114.4 mL/min (by C-G formula based on SCr of 0.73 mg/dL). Liver Function Tests: No results for input(s): AST, ALT, ALKPHOS, BILITOT, PROT, ALBUMIN in the last 168 hours. No results for input(s): LIPASE, AMYLASE in the last 168  hours. No results for input(s): AMMONIA in the last 168 hours. Coagulation Profile: Recent Labs  Lab 05/02/21 0134  INR 1.0   Cardiac Enzymes: No results for input(s): CKTOTAL, CKMB, CKMBINDEX, TROPONINI in the last 168 hours. BNP (last 3 results) No results for input(s): PROBNP in the last 8760 hours. HbA1C: No results for input(s): HGBA1C in the last 72 hours. CBG: No results for input(s): GLUCAP in the last 168 hours. Lipid Profile: No results for input(s): CHOL, HDL, LDLCALC, TRIG, CHOLHDL, LDLDIRECT in the last 72 hours. Thyroid Function Tests: No results for input(s): TSH, T4TOTAL, FREET4, T3FREE, THYROIDAB in the last 72 hours. Anemia Panel: No results for input(s): VITAMINB12, FOLATE, FERRITIN, TIBC, IRON, RETICCTPCT in the last 72 hours. Urine analysis:    Component Value Date/Time   COLORURINE AMBER (A) 07/26/2019 2136   APPEARANCEUR CLOUDY (A) 07/26/2019 2136   LABSPEC 1.038 (H) 07/26/2019 2136   PHURINE 5.0 07/26/2019 2136   GLUCOSEU NEGATIVE 07/26/2019 2136   HGBUR NEGATIVE 07/26/2019 2136   BILIRUBINUR NEGATIVE 07/26/2019 2136   KETONESUR NEGATIVE 07/26/2019 2136   PROTEINUR 100 (A) 07/26/2019 2136   NITRITE NEGATIVE 07/26/2019 2136   LEUKOCYTESUR NEGATIVE 07/26/2019 2136   Sepsis Labs: @LABRCNTIP (procalcitonin:4,lacticidven:4) ) Recent Results (from the past 240 hour(s))  Resp Panel by RT-PCR (Flu A&B, Covid) Nasopharyngeal Swab     Status: None   Collection Time: 05/02/21  1:58 AM   Specimen: Nasopharyngeal Swab; Nasopharyngeal(NP) swabs in vial transport medium  Result Value Ref Range Status   SARS Coronavirus 2 by RT PCR NEGATIVE NEGATIVE Final    Comment: (NOTE) SARS-CoV-2 target nucleic acids are NOT DETECTED.  The SARS-CoV-2 RNA is generally detectable in upper respiratory specimens during the acute phase of infection. The lowest concentration of SARS-CoV-2 viral copies this assay can detect is 138 copies/mL. A negative result does not preclude  SARS-Cov-2 infection and should not be used as the sole basis for treatment or other patient management decisions. A negative result may occur with  improper specimen collection/handling, submission of specimen other than nasopharyngeal swab, presence of viral mutation(s) within the areas targeted by this assay, and inadequate number of viral copies(<138 copies/mL). A negative result must be combined with clinical observations, patient history, and epidemiological information. The expected result is Negative.  Fact Sheet for Patients:  07/02/21  Fact Sheet for Healthcare Providers:  BloggerCourse.com  This test is no t yet approved or cleared by the [place] FDA and  has been authorized for detection and/or diagnosis of SARS-CoV-2 by FDA under an Emergency Use Authorization (EUA). This EUA will remain  in effect (meaning this test can be used) for the duration of the COVID-19 declaration under Section 564(b)(1) of the Act, 21 U.S.C.section 360bbb-3(b)(1), unless the authorization is terminated  or revoked sooner.       Influenza A by PCR NEGATIVE NEGATIVE Final  Influenza B by PCR NEGATIVE NEGATIVE Final    Comment: (NOTE) The Xpert Xpress SARS-CoV-2/FLU/RSV plus assay is intended as an aid in the diagnosis of influenza from Nasopharyngeal swab specimens and should not be used as a sole basis for treatment. Nasal washings and aspirates are unacceptable for Xpert Xpress SARS-CoV-2/FLU/RSV testing.  Fact Sheet for Patients: BloggerCourse.com  Fact Sheet for Healthcare Providers: SeriousBroker.it  This test is not yet approved or cleared by the [place] FDA and has been authorized for detection and/or diagnosis of SARS-CoV-2 by FDA under an Emergency Use Authorization (EUA). This EUA will remain in effect (meaning this test can be used) for the duration of  the COVID-19 declaration under Section 564(b)(1) of the Act, 21 U.S.C. section 360bbb-3(b)(1), unless the authorization is terminated or revoked.  Performed at [facility], [address], [place], [place] [address]      Radiological Exams on Admission: DG Chest 2 View  Result Date: 05/02/2021 CLINICAL DATA:  Chest pain for several days. EXAM: CHEST - 2 VIEW COMPARISON:  None. FINDINGS: The cardiomediastinal contours are normal. The lungs are clear. Pulmonary vasculature is normal. No consolidation, pleural effusion, or pneumothorax. No acute osseous abnormalities are seen. IMPRESSION: Negative radiographs of the chest. Electronically Signed   By: Narda Rutherford M.D.   On: 05/02/2021 00:51   CT Angio Chest PE W and/or Wo Contrast  Result Date: 05/02/2021 CLINICAL DATA:  mid-sternal chest pain that radiates down left arm that has been going on for a few days but has become worse today. Pt denies heart problems. Pt states pain feels like a pressure/burning in nature. EXAM: CT ANGIOGRAPHY CHEST WITH CONTRAST TECHNIQUE: Multidetector CT imaging of the chest was performed using the standard protocol during bolus administration of intravenous contrast. Multiplanar CT image reconstructions and MIPs were obtained to evaluate the vascular anatomy. CONTRAST:  OMNIPAQUE IOHEXOL 350 MG/ML SOLN COMPARISON:  Chest x-ray 05/02/2021, CT chest 07/17/2006 FINDINGS: Cardiovascular: Satisfactory opacification of the pulmonary arteries to the segmental level. No evidence of pulmonary embolism. Normal heart size. No pericardial effusion. Mediastinum/Nodes: No enlarged mediastinal, hilar, or axillary lymph nodes. Asymmetric right thyroid gland. No definite hypodense nodule or calcification. The trachea and esophagus demonstrate no significant findings. Tiny hiatal hernia. Lungs/Pleura: No focal consolidation. No pulmonary nodule. No pulmonary mass. No pleural effusion. No pneumothorax. Upper Abdomen: No  acute abnormality. Musculoskeletal: No chest wall abnormality. No suspicious lytic or blastic osseous lesions. No acute displaced fracture. Review of the MIP images confirms the above findings. IMPRESSION: No pulmonary embolus. No acute intrapulmonary abnormality Tiny hiatal hernia. Electronically Signed   By: Tish Frederickson M.D.   On: 05/02/2021 02:52    EKG: Independently reviewed.  Procedure showing sinus tachycardia with nonspecific ST changes.  Second EKG was showing normal sinus rhythm with nonspecific ST changes.  Assessment/Plan Principal Problem:   Non-ST elevated myocardial infarction [facility]) Active Problems:   Diabetes type 2, uncontrolled (HCC)   NSTEMI (non-ST elevated myocardial infarction) (HCC)    Non-ST elevation MI -patient has been started on heparin infusion, aspirin, beta-blockers, statins, nitroglycerin.  We will keep patient n.p.o. consult cardiology check 2D echo.  Patient Diabetes mellitus type 2 we will keep patient on Lantus 10 units with sliding scale coverage. Diffuse urticarial rash cause not clear.  Patient's chart mentioned that patient has had nummular eczema previously.  Patient did try prednisone over the last 1 week with no much improvement.  No lesions in the mouth.  Patient is afebrile.  Check sed rate ANA. Leukocytosis could be from recent use of steroids.  Patient is afebrile.  Follow CBC. Tobacco abuse -patient advised to quit smoking.  Since patient has features concerning for non-ST ovation MI will need close monitoring and inpatient status.   DVT prophylaxis: Heparin infusion. Code Status: Full code. Family Communication: Discussed with patient. Disposition Plan: Home. Consults called: We will consult cardiology. Admission status: Inpatient.   Eduard Clos MD [facility] Pager [phone].  If 7PM-7AM, please contact night-coverage [email or web address] Password [facility]  05/02/2021, 4:36 AM

## 2021-05-02 NOTE — Progress Notes (Signed)
Pt. C/o severe nausea post PCI; med. With Zofran IV . Pt. Then c/o CP "6" on scale 1-10. Pt. Med. With Morphine 2 mg slow IVP. 12 lead EKG done.

## 2021-05-02 NOTE — Progress Notes (Signed)
PHARMACIST - PHYSICIAN COMMUNICATION  CONCERNING:  Enoxaparin (Lovenox) for DVT Prophylaxis    RECOMMENDATION: Patient was prescribed enoxaprin 40mg  q24 hours for VTE prophylaxis.   Filed Weights   05/02/21 0022 05/02/21 1002  Weight: 108.9 kg (240 lb) 108.9 kg (240 lb)    Body mass index is 38.74 kg/m.  Estimated Creatinine Clearance: 114.4 mL/min (by C-G formula based on SCr of 0.73 mg/dL).   Based on [facility] policy patient is candidate for enoxaparin 0.5mg /kg TBW SQ every 24 hours based on BMI being >30.  DESCRIPTION: Pharmacy has adjusted enoxaparin dose per [facility] policy.  Patient is now receiving enoxaparin 55 mg every 24 hours    [facility], PharmD Clinical Pharmacist  05/02/2021 4:13 PM

## 2021-05-02 NOTE — Progress Notes (Signed)
No charge progress note.  Karen Ruiz is a 40 y.o. female with history of diabetes mellitus type 2 and tobacco abuse presents to the ER with complaints of chest pain.  Patient has been having retrosternal chest pain radiating to the back for the last 2 days.  Over the last 1 week patient has developed diffuse urticarial rash and had taken prednisone and yesterday was started on antibiotics since prednisone was not helping with the rash.  Troponin elevated with no significant EKG changes except left axis deviation.  Cardiology was consulted for NSTEMI and patient was taken to Cath Lab and found to have severe two-vessel disease s/p successful PCI. -Patient will remain on DAPT for 1 year. -ANA, ESR and CRP was ordered for rash. -We will continue current management.

## 2021-05-02 NOTE — Progress Notes (Signed)
ANTICOAGULATION CONSULT NOTE - Initial Consult  Pharmacy Consult for Heparin  Indication: chest pain/ACS  No Known Allergies  Patient Measurements: Height: 5\' 6"  (167.6 cm) Weight: 108.9 kg (240 lb) IBW/kg (Calculated) : 59.3 Heparin Dosing Weight: 84.5 kg   Vital Signs: Temp: 98.7 F (37.1 C) (08/02 0024) Temp Source: Oral (08/02 0024) BP: 147/105 (08/02 0024) Pulse Rate: 128 (08/02 0024)  Labs: Recent Labs    05/02/21 0025  HGB 16.9*  HCT 48.4*  PLT 398  CREATININE 0.73  TROPONINIHS 224*    Estimated Creatinine Clearance: 114.4 mL/min (by C-G formula based on SCr of 0.73 mg/dL).   Medical History: Past Medical History:  Diagnosis Date   Barrett esophagus    Gastroparesis    Migraines    MRSA (methicillin resistant staph aureus) culture positive    2009   Obesity     Medications:  (Not in a hospital admission)   Assessment: Pharmacy consulted to dose heparin in this 40 year old female admitted with ACS/NSTEMI.  No prior anticoag noted.  CrCl = 114.4 ml/min   Goal of Therapy:  Heparin level 0.3-0.7 units/ml Monitor platelets by anticoagulation protocol: Yes   Plan:  Give 4000 units bolus x 1 Start heparin infusion at 1100 units/hr Check anti-Xa level in 6 hours and daily while on heparin Continue to monitor H&H and platelets  Leshawn Houseworth D 05/02/2021,1:32 AM

## 2021-05-02 NOTE — ED Notes (Signed)
poct pregnancy Negative 

## 2021-05-02 NOTE — Consult Note (Signed)
Cardiology Consultation:   Patient ID: Karen Ruiz MRN: [id number]; DOB: 1981-04-24  Admit date: 05/02/2021 Date of Consult: 05/02/2021  PCP:  Marina Goodell, MD   [facility]  Cardiologist:  [facility], Dr. Okey Dupre Advanced Practice Provider:  No care team member to display Electrophysiologist:  None [phone]    Patient Profile:   Karen Ruiz is a 40 y.o. female with a hx of diabetes, Barrett's esophagus, current tobacco/marijuana use, and no previously known history of heart disease who is being seen today for the evaluation of Non ST elevation myocardial infarction at the request of Dr. Nelson Chimes.  History of Present Illness:   Karen Ruiz is a 40 year old female with PMH as above.  She has no previous history of cardiac disease or previous cardiac work-up. She has family history of CAD.  She currently uses tobacco and marijuana.  She occasionally to rarely drinks alcohol.  On 05/02/21, she presented to the ED with central/ substernal chest pain that started on Friday, 04/28/2021 and has been coming and going since that time. CP was described as a burning that initially was 8/10 in severity and occurred without clear triggers. CP present both with exertion and at rest. No other associated symptoms reported, other than weakness and fatigue in the week leading up to the CP, and during which time she had a full body rash. No CP like this before in the past.  For 1 week leading up to this CP, she had a full body maculopapular rash of unclear etiology that broke out over her entire body with the exception of her face. She denies experiencing a rash like this before in the past. No tick bites. No exposure to new detergents or known allergens. No new medications. She has been taking benadryl for this rash, which resulted in fatigue.  Given her family history of heart disease, she presented to [facility] ED 05/02/2021.  Initial BP 147/105.  Labs showed sodium 130  (corrected Na 134), glucose 324, calcium 8.8, hemoglobin 16.9, hematocrit 48.4. HS Tn 233.  Initial EKG sinus tachycardia at 127bpm, right axis deviation, nonspecific ST/T changes / repolarization changes.  Subsequent CTA was without evidence of pulmonary embolus and showed a tiny hiatal hernia. She was started on IV heparin and nitro with ASA and IV morphine administered. Cardiology consulted. At the time of consultation, CP still present and 5/10 in severity.  Past Medical History:  Diagnosis Date   Barrett esophagus    Diabetes mellitus without complication (HCC)    Gastroparesis    Migraines    MRSA (methicillin resistant staph aureus) culture positive    2009   Obesity     Past Surgical History:  Procedure Laterality Date   CESAREAN SECTION     ESOPHAGOGASTRODUODENOSCOPY N/A 07/28/2019   Procedure: ESOPHAGOGASTRODUODENOSCOPY (EGD);  Surgeon: Toney Reil, MD;  Location: [facility] ENDOSCOPY;  Service: Gastroenterology;  Laterality: N/A;     Home Medications:  Prior to Admission medications   Medication Sig Start Date End Date Taking? Authorizing Provider  insulin aspart (NOVOLOG) 100 UNIT/ML injection Inject 0-9 Units into the skin 3 (three) times daily with meals. Patient taking differently: Inject 11 Units into the skin 2 (two) times daily. 07/16/19  Yes Katha Hamming, MD  predniSONE (STERAPRED UNI-PAK 21 TAB) 10 MG (21) TBPK tablet Take 1 tablet by mouth as directed. Patient not taking: No sig reported 04/24/21   [provider]    Inpatient Medications: Scheduled Meds:  [START ON  05/03/2021] aspirin  81 mg Oral Pre-Cath   aspirin EC  81 mg Oral Daily   atorvastatin  80 mg Oral Daily   insulin aspart  0-9 Units Subcutaneous Q4H   insulin glargine-yfgn  10 Units Subcutaneous Daily   metoprolol tartrate  25 mg Oral BID   sodium chloride flush  3 mL Intravenous Q12H   Continuous Infusions:  sodium chloride     sodium chloride     Followed by   sodium  chloride     heparin 1,100 Units/hr (05/02/21 0151)   nitroGLYCERIN 6 mcg/min (05/02/21 0530)   PRN Meds: sodium chloride, morphine injection, sodium chloride flush  Allergies:   No Known Allergies  Social History:   Social History   Socioeconomic History   Marital status: Single    Spouse name: Not on file   Number of children: Not on file   Years of education: Not on file   Highest education level: Not on file  Occupational History   Not on file  Tobacco Use   Smoking status: Every Day    Packs/day: 0.50    Types: Cigarettes   Smokeless tobacco: Never  Substance and Sexual Activity   Alcohol use: Yes    Comment: socially   Drug use: Not on file   Sexual activity: Not on file  Other Topics Concern   Not on file  Social History Narrative   Not on file   Social Determinants of Health   Financial Resource Strain: Not on file  Food Insecurity: Not on file  Transportation Needs: Not on file  Physical Activity: Not on file  Stress: Not on file  Social Connections: Not on file  Intimate Partner Violence: Not on file    Family History:    Family History  Problem Relation Age of Onset   Diabetes Mother    Cancer Mother    Heart failure Mother    Hypertension Mother    Heart attack Father    Diabetes Father    Hypertension Father      ROS:  Please see the history of present illness.  Review of Systems  Constitutional:  Positive for malaise/fatigue.  Respiratory:  Negative for shortness of breath.   Cardiovascular:  Positive for chest pain. Negative for palpitations, orthopnea and PND.  Neurological:  Positive for weakness.   All other ROS reviewed and negative.     Physical Exam/Data:   Vitals:   05/02/21 0200 05/02/21 0215 05/02/21 0525 05/02/21 0605  BP:   (!) 166/122 (!) 150/103  Pulse: 100 80 94   Resp: (!) 22 12  18   Temp:      TempSrc:      SpO2: 100% 100%    Weight:      Height:       No intake or output data in the 24 hours ending  05/02/21 0816 Last 3 Weights 05/02/2021 03/26/2021 03/26/2021  Weight (lbs) 240 lb 245 lb 245 lb  Weight (kg) 108.863 kg 111.131 kg 111.131 kg     Body mass index is 38.74 kg/m.  General: Obese female, in no acute distress HEENT: normal Lymph: no adenopathy Neck: JVD difficult to assess due to body habitus Vascular: Radial pulses 2+ bialterally  Cardiac:  normal S1, S2; RRR; no murmur  Lungs:  clear to auscultation bilaterally, no wheezing, rhonchi or rales  Abd: soft, nontender, no hepatomegaly  Ext: no edema, maculopapular rash Musculoskeletal:  No deformities, BUE and BLE strength normal and equal Skin: warm  and dry with diffuse maculopapular rash. Neuro:  CNs 2-12 intact, no focal abnormalities noted Psych:  Normal affect   EKG:  The EKG was personally reviewed and demonstrates: Sinus tachycardia, 127 bpm, right axis deviation, nonspecific ST/T changes/repolarization changes Telemetry:  Telemetry was personally reviewed and demonstrates: NSR-ST  Relevant CV Studies: Echo ordered by internal medicine, pending  Laboratory Data:  High Sensitivity Troponin:   Recent Labs  Lab 05/02/21 0025 05/02/21 0513  TROPONINIHS 224* 233*     Chemistry Recent Labs  Lab 05/02/21 0025  NA 130*  K 4.4  CL 101  CO2 20*  GLUCOSE 324*  BUN 14  CREATININE 0.73  CALCIUM 8.8*  GFRNONAA >60  ANIONGAP 9    Recent Labs  Lab 05/02/21 0513  PROT 6.9  ALBUMIN 3.5  AST 22  ALT 35  ALKPHOS 74  BILITOT 0.8   Hematology Recent Labs  Lab 05/02/21 0025  WBC 20.1*  RBC 5.79*  HGB 16.9*  HCT 48.4*  MCV 83.6  MCH 29.2  MCHC 34.9  RDW 14.6  PLT 398   BNPNo results for input(s): BNP, PROBNP in the last 168 hours.  DDimer No results for input(s): DDIMER in the last 168 hours.   Radiology/Studies:  DG Chest 2 View  Result Date: 05/02/2021 CLINICAL DATA:  Chest pain for several days. EXAM: CHEST - 2 VIEW COMPARISON:  None. FINDINGS: The cardiomediastinal contours are normal. The  lungs are clear. Pulmonary vasculature is normal. No consolidation, pleural effusion, or pneumothorax. No acute osseous abnormalities are seen. IMPRESSION: Negative radiographs of the chest. Electronically Signed   By: Narda RutherfordMelanie  Sanford M.D.   On: 05/02/2021 00:51   CT Angio Chest PE W and/or Wo Contrast  Result Date: 05/02/2021 CLINICAL DATA:  mid-sternal chest pain that radiates down left arm that has been going on for a few days but has become worse today. Pt denies heart problems. Pt states pain feels like a pressure/burning in nature. EXAM: CT ANGIOGRAPHY CHEST WITH CONTRAST TECHNIQUE: Multidetector CT imaging of the chest was performed using the standard protocol during bolus administration of intravenous contrast. Multiplanar CT image reconstructions and MIPs were obtained to evaluate the vascular anatomy. CONTRAST:  100mL OMNIPAQUE IOHEXOL 350 MG/ML SOLN COMPARISON:  Chest x-ray 05/02/2021, CT chest 07/17/2006 FINDINGS: Cardiovascular: Satisfactory opacification of the pulmonary arteries to the segmental level. No evidence of pulmonary embolism. Normal heart size. No pericardial effusion. Mediastinum/Nodes: No enlarged mediastinal, hilar, or axillary lymph nodes. Asymmetric right thyroid gland. No definite hypodense nodule or calcification. The trachea and esophagus demonstrate no significant findings. Tiny hiatal hernia. Lungs/Pleura: No focal consolidation. No pulmonary nodule. No pulmonary mass. No pleural effusion. No pneumothorax. Upper Abdomen: No acute abnormality. Musculoskeletal: No chest wall abnormality. No suspicious lytic or blastic osseous lesions. No acute displaced fracture. Review of the MIP images confirms the above findings. IMPRESSION: No pulmonary embolus. No acute intrapulmonary abnormality Tiny hiatal hernia. Electronically Signed   By: Tish FredericksonMorgane  Naveau M.D.   On: 05/02/2021 02:52     Assessment and Plan:   NSTEMI --Current chest pain, rated 5/10, and described as a central  burning that started 7/29 and has persisted on and off since that time.  No previously known history of heart disease.  CTA negative for PE. Risk factors for CAD include family history, tobacco use, diabetes, obesity. Tn 224  233.  EKG with nonspecific ST/T changes. Given her ongoing CP and RF for coronary insufficiency, recommendation was to proceed with LHC for further evaluation  of her CP. Pt is agreeable to proceed with LHC later this morning. Keep NPO in preparation for LHC. Continue to cycle Tn until peaked, down-trending. Serial EKGs. Agree with ordered echo (changed to [facility] readers). Continue IV heparin. Continue ASA, BB, statin. For now, we will continue both IV nitro and PRN IV morphine for CP. Will order further labs for RF stratification. Further recommendations as needed pending LHC and echo. Shared Decision Making/Informed Consent The risks [stroke (1 in 1000), death (1 in 1000), kidney failure [usually temporary] (1 in 500), bleeding (1 in 200), allergic reaction [possibly serious] (1 in 200)], benefits (diagnostic support and management of coronary artery disease) and alternatives of a cardiac catheterization were discussed in detail with Ms. Calix and she is willing to proceed.   Hypertension --Continue current BB and IV nitro. Recommend addition of ACE/ARB after LHC given comorbid DM2.  Maculopapular rash --Unclear etiology. Recommend continue to monitor per IM.   DM2 --SSI, per IM.  Tobacco use, marijuana use  --Smoking cessation recommended.      TIMI Risk Score for Unstable Angina or Non-ST Elevation MI:   The patient's TIMI risk score is 4, which indicates a 20% risk of all cause mortality, new or recurrent myocardial infarction or need for urgent revascularization in the next 14 days.           For questions or updates, please contact [facility] Please consult [email or web address] for contact info under    Signed, Lennon Alstrom, PA-C  05/02/2021 8:16 AM

## 2021-05-02 NOTE — ED Provider Notes (Signed)
[facility] Emergency Department Provider Note  ____________________________________________  Time seen: Approximately 1:51 AM  I have reviewed the triage vital signs and the nursing notes.   HISTORY  Chief Complaint Chest Pain   HPI Karen Ruiz is a 40 y.o. female with a history of diabetes, smoking, obesity who presents for evaluation of chest pain.  Patient reports 2 to 3 days of intermittent burning/tightness diffuse chest pain associated with shortness of breath.  Patient reports that the chest pain happens both at rest and with minimal exertion.  Today she felt extremely fatigued.  She denies cough or fever, body aches or chills, nausea or vomiting.  She reports strong family history of heart disease.  Of note patient has just finished a course of prednisone for a diffuse pruritic rash.  She currently endorses 7 out of 10 pain.  She denies any personal or family history of blood clots, no recent travel or immobilization, no leg pain or swelling, no hemoptysis or exogenous hormones.   Past Medical History:  Diagnosis Date   Barrett esophagus    Diabetes mellitus without complication (HCC)    Gastroparesis    Migraines    MRSA (methicillin resistant staph aureus) culture positive    2009   Obesity     Patient Active Problem List   Diagnosis Date Noted   Non-ST elevated myocardial infarction (HCC) 05/02/2021   NSTEMI (non-ST elevated myocardial infarction) (HCC) 05/02/2021   Acute gastritis 07/27/2019   Intractable nausea and vomiting 07/15/2019   Dependent edema 03/05/2019   Nummular eczematous dermatitis 03/05/2019   Tobacco abuse 09/06/2016   Diabetes type 2, uncontrolled (HCC) 09/06/2016   Barrett esophagus 03/28/2012   Hypertension 01/10/2012   Eustachian tube dysfunction 07/06/2003    Past Surgical History:  Procedure Laterality Date   CESAREAN SECTION     ESOPHAGOGASTRODUODENOSCOPY N/A 07/28/2019   Procedure:  ESOPHAGOGASTRODUODENOSCOPY (EGD);  Surgeon: Toney Reil, MD;  Location: [facility] ENDOSCOPY;  Service: Gastroenterology;  Laterality: N/A;    Prior to Admission medications   Medication Sig Start Date End Date Taking? Authorizing Provider  insulin aspart (NOVOLOG) 100 UNIT/ML injection Inject 0-9 Units into the skin 3 (three) times daily with meals. Patient taking differently: Inject 11 Units into the skin 2 (two) times daily. 07/16/19  Yes Katha Hamming, MD  predniSONE (STERAPRED UNI-PAK 21 TAB) 10 MG (21) TBPK tablet Take 1 tablet by mouth as directed. Patient not taking: No sig reported 04/24/21   [provider]    Allergies Patient has no known allergies.  Family History  Problem Relation Age of Onset   Diabetes Mother    Cancer Mother    Heart failure Mother    Hypertension Mother    Heart attack Father    Diabetes Father    Hypertension Father     Social History Social History   Tobacco Use   Smoking status: Every Day    Packs/day: 0.50    Types: Cigarettes   Smokeless tobacco: Never  Substance Use Topics   Alcohol use: Yes    Comment: socially    Review of Systems  Constitutional: Negative for fever. Eyes: Negative for visual changes. ENT: Negative for sore throat. Neck: No neck pain  Cardiovascular: + chest pain. Respiratory: + shortness of breath. Gastrointestinal: Negative for abdominal pain, vomiting or diarrhea. Genitourinary: Negative for dysuria. Musculoskeletal: Negative for back pain. Skin: + rash. Neurological: Negative for headaches, weakness or numbness. Psych: No SI or HI  ____________________________________________  PHYSICAL EXAM:  VITAL SIGNS: ED Triage Vitals  Enc Vitals Group     BP 05/02/21 0024 (!) 147/105     Pulse Rate 05/02/21 0024 (!) 128     Resp 05/02/21 0024 20     Temp 05/02/21 0024 98.7 F (37.1 C)     Temp Source 05/02/21 0024 Oral     SpO2 05/02/21 0024 98 %     Weight 05/02/21 0022 240 lb  (108.9 kg)     Height 05/02/21 0022 5\' 6"  (1.676 m)     Head Circumference --      Peak Flow --      Pain Score 05/02/21 0022 10     Pain Loc --      Pain Edu? --      Excl. in GC? --     Constitutional: Alert and oriented. Well appearing and in no apparent distress. HEENT:      Head: Normocephalic and atraumatic.         Eyes: Conjunctivae are normal. Sclera is non-icteric.       Mouth/Throat: Mucous membranes are moist.       Neck: Supple with no signs of meningismus. Cardiovascular: Tachycardic with regular rhythm  respiratory: Normal respiratory effort. Lungs are clear to auscultation bilaterally.  Gastrointestinal: Soft, non tender, and non distended with positive bowel sounds. No rebound or guarding. Genitourinary: No CVA tenderness. Musculoskeletal:  No edema, cyanosis, or erythema of extremities. Neurologic: Normal speech and language. Face is symmetric. Moving all extremities. No gross focal neurologic deficits are appreciated. Skin: Skin is warm, dry and intact.  Patient has a diffuse pruritic erythematous and blanching rash involving most of her body Psychiatric: Mood and affect are normal. Speech and behavior are normal.  ____________________________________________   LABS (all labs ordered are listed, but only abnormal results are displayed)  Labs Reviewed  BASIC METABOLIC PANEL - Abnormal; Notable for the following components:      Result Value   Sodium 130 (*)    CO2 20 (*)    Glucose, Bld 324 (*)    Calcium 8.8 (*)    All other components within normal limits  CBC - Abnormal; Notable for the following components:   WBC 20.1 (*)    RBC 5.79 (*)    Hemoglobin 16.9 (*)    HCT 48.4 (*)    All other components within normal limits  CBG MONITORING, ED - Abnormal; Notable for the following components:   Glucose-Capillary 387 (*)    All other components within normal limits  TROPONIN I (HIGH SENSITIVITY) - Abnormal; Notable for the following components:    Troponin I (High Sensitivity) 224 (*)    All other components within normal limits  TROPONIN I (HIGH SENSITIVITY) - Abnormal; Notable for the following components:   Troponin I (High Sensitivity) 233 (*)    All other components within normal limits  RESP PANEL BY RT-PCR (FLU A&B, COVID) ARPGX2  APTT  PROTIME-INR  HEPATIC FUNCTION PANEL  HEPARIN LEVEL (UNFRACTIONATED)  URINE DRUG SCREEN, QUALITATIVE ([facility] ONLY)  HEMOGLOBIN A1C  HIV ANTIBODY (ROUTINE TESTING W REFLEX)  ANA W/REFLEX  SEDIMENTATION RATE  C-REACTIVE PROTEIN  POC URINE PREG, ED  TROPONIN I (HIGH SENSITIVITY)   ____________________________________________  EKG  ED ECG REPORT I, Nita Sicklearolina Persais Ethridge, the attending physician, personally viewed and interpreted this ECG.  Sinus tachycardia with a rate of 106, right axis deviation with no ST elevation. ____________________________________________  RADIOLOGY  I have personally reviewed the images performed during  this visit and I agree with the Radiologist's read.   Interpretation by Radiologist:  DG Chest 2 View  Result Date: 05/02/2021 CLINICAL DATA:  Chest pain for several days. EXAM: CHEST - 2 VIEW COMPARISON:  None. FINDINGS: The cardiomediastinal contours are normal. The lungs are clear. Pulmonary vasculature is normal. No consolidation, pleural effusion, or pneumothorax. No acute osseous abnormalities are seen. IMPRESSION: Negative radiographs of the chest. Electronically Signed   By: Narda Rutherford M.D.   On: 05/02/2021 00:51   CT Angio Chest PE W and/or Wo Contrast  Result Date: 05/02/2021 CLINICAL DATA:  mid-sternal chest pain that radiates down left arm that has been going on for a few days but has become worse today. Pt denies heart problems. Pt states pain feels like a pressure/burning in nature. EXAM: CT ANGIOGRAPHY CHEST WITH CONTRAST TECHNIQUE: Multidetector CT imaging of the chest was performed using the standard protocol during bolus administration of  intravenous contrast. Multiplanar CT image reconstructions and MIPs were obtained to evaluate the vascular anatomy. CONTRAST:  OMNIPAQUE IOHEXOL 350 MG/ML SOLN COMPARISON:  Chest x-ray 05/02/2021, CT chest 07/17/2006 FINDINGS: Cardiovascular: Satisfactory opacification of the pulmonary arteries to the segmental level. No evidence of pulmonary embolism. Normal heart size. No pericardial effusion. Mediastinum/Nodes: No enlarged mediastinal, hilar, or axillary lymph nodes. Asymmetric right thyroid gland. No definite hypodense nodule or calcification. The trachea and esophagus demonstrate no significant findings. Tiny hiatal hernia. Lungs/Pleura: No focal consolidation. No pulmonary nodule. No pulmonary mass. No pleural effusion. No pneumothorax. Upper Abdomen: No acute abnormality. Musculoskeletal: No chest wall abnormality. No suspicious lytic or blastic osseous lesions. No acute displaced fracture. Review of the MIP images confirms the above findings. IMPRESSION: No pulmonary embolus. No acute intrapulmonary abnormality Tiny hiatal hernia. Electronically Signed   By: Tish Frederickson M.D.   On: 05/02/2021 02:52     ____________________________________________   PROCEDURES  Procedure(s) performed:yes .1-3 Lead EKG Interpretation  Date/Time: 05/02/2021 1:55 AM Performed by: Nita Sickle, MD Authorized by: Nita Sickle, MD     Interpretation: abnormal     ECG rate assessment: tachycardic     Rhythm: sinus tachycardia     Ectopy: none     Conduction: normal    Critical Care performed: yes  CRITICAL CARE Performed by: Nita Sickle  ?  Total critical care time: 30 min  Critical care time was exclusive of separately billable procedures and treating other patients.  Critical care was necessary to treat or prevent imminent or life-threatening deterioration.  Critical care was time spent personally by me on the following activities: development of treatment plan with patient  and/or surrogate as well as nursing, discussions with consultants, evaluation of patient's response to treatment, examination of patient, obtaining history from patient or surrogate, ordering and performing treatments and interventions, ordering and review of laboratory studies, ordering and review of radiographic studies, pulse oximetry and re-evaluation of patient's condition.  ____________________________________________   INITIAL IMPRESSION / ASSESSMENT AND PLAN / ED COURSE   40 y.o. female with a history of diabetes, smoking, obesity who presents for evaluation of chest pain.  Patient with several days of on and off chest pain that she describes as tightness and burning sensation associated with shortness of breath and severe fatigue over the last 24 hours.  Patient is tachycardic with a pulse in the 120s with normal work of breathing and normal sats, lungs are clear to auscultation, she otherwise looks euvolemic.  Her EKG shows sinus tachycardia with no evidence of ischemia.  Her initial troponin is elevated at 224.  Differential diagnosis including ACS versus PE versus myocarditis.  Will check for COVID.  I did repeat an EKG that shows no evidence of a STEMI.  We will start patient on heparin.  We will give a full dose of aspirin.  Patient given IV morphine for pain. We will get a CT angio of the chest.  Patient placed on telemetry for close monitoring of cardiorespiratory status.  Old medical records reviewed.  _________________________ 2:41 AM on 05/02/2021 ----------------------------------------- CT negative for PE. Will start nitro drip to control pain and discuss with hospitalist for admission.     _____________________________________________ Please note:  Patient was evaluated in Emergency Department today for the symptoms described in the history of present illness. Patient was evaluated in the context of the global COVID-19 pandemic, which necessitated consideration that the patient  might be at risk for infection with the SARS-CoV-2 virus that causes COVID-19. Institutional protocols and algorithms that pertain to the evaluation of patients at risk for COVID-19 are in a state of rapid change based on information released by regulatory bodies including the CDC and federal and state organizations. These policies and algorithms were followed during the patient's care in the ED.  Some ED evaluations and interventions may be delayed as a result of limited staffing during the pandemic.   [place] Controlled Substance Database was reviewed by me. ____________________________________________   FINAL CLINICAL IMPRESSION(S) / ED DIAGNOSES   Final diagnoses:  NSTEMI (non-ST elevated myocardial infarction) (HCC)      NEW MEDICATIONS STARTED DURING THIS VISIT:  ED Discharge Orders     None        Note:  This document was prepared using Dragon voice recognition software and may include unintentional dictation errors.    Nita Sickle, MD 05/02/21 [phone]

## 2021-05-02 NOTE — Progress Notes (Signed)
Pt. States no more HA or nausea. Mild burning residual to chest. Pt. Talkative without any acute distress note. TR band off right radial site; sterile 2x2 and Tegaderm to site. Stable for tx to 2A.

## 2021-05-02 NOTE — ED Triage Notes (Signed)
Pt presents to ER c/o mid-sternal chest pain that radiates down left arm that has been going on for a few days but has become worse today.  Pt denies heart problems.  Pt states pain feels like a pressure/burning in nature. Pt also with rash noted to BIL arms.  Pt states she just got off prednisone for rash.

## 2021-05-02 NOTE — Brief Op Note (Signed)
BRIEF CARDIAC CATHETERIZATION NOTE  05/02/2021  12:49 PM  PATIENT:  Karen Ruiz  40 y.o. female  PRE-OPERATIVE DIAGNOSIS:  Unstable angina, Non ST Elevation Myocardial Infarction  POST-OPERATIVE DIAGNOSIS:  * No post-op diagnosis entered *  PROCEDURE:  Procedure(s): LEFT HEART CATH AND CORONARY ANGIOGRAPHY (N/A) CORONARY STENT INTERVENTION (N/A) Intravascular Ultrasound/IVUS (N/A)  SURGEON:  Surgeon(s) and Role:    * Zuleica Seith, MD - Primary  FINDINGS: Severe 2-vessel CAD with diffuse proximal/mid LAD disease of up to 60%, occlusion of apical LAD, and segment of diffuse mid RCA stenosis and focal ulceration causing 80% stenosis. Normal LVEF with apical inferior akinesis.  Normal LVEDP. Successful IVUS-guided PCI to mid RCA using Onyx Frontier 3.0 x 30 mm drug-eluting stent (post-dilated to 3.6 mm) with 0% residual stenosis and TIMI-3 flow.  RECOMMENDATIONS: DAPT with aspirin and ticagrelor for at least 12 months. Aggressive secondary prevention.  Yvonne Kendall, MD [facility]

## 2021-05-03 ENCOUNTER — Telehealth: Payer: Self-pay | Admitting: *Deleted

## 2021-05-03 ENCOUNTER — Encounter: Payer: Self-pay | Admitting: Internal Medicine

## 2021-05-03 ENCOUNTER — Other Ambulatory Visit: Payer: Self-pay

## 2021-05-03 DIAGNOSIS — E785 Hyperlipidemia, unspecified: Secondary | ICD-10-CM

## 2021-05-03 DIAGNOSIS — I214 Non-ST elevation (NSTEMI) myocardial infarction: Secondary | ICD-10-CM | POA: Diagnosis not present

## 2021-05-03 DIAGNOSIS — I2510 Atherosclerotic heart disease of native coronary artery without angina pectoris: Secondary | ICD-10-CM | POA: Diagnosis not present

## 2021-05-03 DIAGNOSIS — E118 Type 2 diabetes mellitus with unspecified complications: Secondary | ICD-10-CM

## 2021-05-03 DIAGNOSIS — E1165 Type 2 diabetes mellitus with hyperglycemia: Secondary | ICD-10-CM | POA: Diagnosis not present

## 2021-05-03 DIAGNOSIS — E1169 Type 2 diabetes mellitus with other specified complication: Secondary | ICD-10-CM

## 2021-05-03 LAB — BASIC METABOLIC PANEL
Anion gap: 7 (ref 5–15)
BUN: 11 mg/dL (ref 6–20)
CO2: 24 mmol/L (ref 22–32)
Calcium: 8.5 mg/dL — ABNORMAL LOW (ref 8.9–10.3)
Chloride: 101 mmol/L (ref 98–111)
Creatinine, Ser: 0.68 mg/dL (ref 0.44–1.00)
GFR, Estimated: >60 mL/min (ref >60)
Glucose, Bld: 284 mg/dL — ABNORMAL HIGH (ref 70–99)
Potassium: 3.9 mmol/L (ref 3.5–5.1)
Sodium: 132 mmol/L — ABNORMAL LOW (ref 135–145)

## 2021-05-03 LAB — ECHOCARDIOGRAM COMPLETE
Area-P 1/2: 3.72 cm2
Height: 66 in
S' Lateral: 3.17 cm
Weight: 3839.99 oz

## 2021-05-03 LAB — CBC
HCT: 42.8 % (ref 36.0–46.0)
Hemoglobin: 14.4 g/dL (ref 12.0–15.0)
MCH: 28.8 pg (ref 26.0–34.0)
MCHC: 33.6 g/dL (ref 30.0–36.0)
MCV: 85.6 fL (ref 80.0–100.0)
Platelets: 292 10*3/uL (ref 150–400)
RBC: 5 MIL/uL (ref 3.87–5.11)
RDW: 14.6 % (ref 11.5–15.5)
WBC: 11.8 10*3/uL — ABNORMAL HIGH (ref 4.0–10.5)
nRBC: 0 % (ref 0.0–0.2)

## 2021-05-03 LAB — GLUCOSE, CAPILLARY
Glucose-Capillary: 241 mg/dL — ABNORMAL HIGH (ref 70–99)
Glucose-Capillary: 258 mg/dL — ABNORMAL HIGH (ref 70–99)
Glucose-Capillary: 309 mg/dL — ABNORMAL HIGH (ref 70–99)
Glucose-Capillary: 326 mg/dL — ABNORMAL HIGH (ref 70–99)

## 2021-05-03 LAB — ANA W/REFLEX: Anti Nuclear Antibody (ANA): NEGATIVE

## 2021-05-03 MED ORDER — METOPROLOL SUCCINATE ER 100 MG PO TB24: 100.0000 mg | ORAL_TABLET | Freq: Every day | ORAL | 0 refills | Status: DC

## 2021-05-03 MED ORDER — LOSARTAN POTASSIUM 25 MG PO TABS: 25.0000 mg | ORAL_TABLET | Freq: Every day | ORAL | 0 refills | Status: DC

## 2021-05-03 MED ORDER — METOPROLOL SUCCINATE ER 100 MG PO TB24: 100.0000 mg | ORAL_TABLET | Freq: Every day | ORAL | Status: DC

## 2021-05-03 MED ORDER — ASPIRIN 81 MG PO TBEC: 81.0000 mg | DELAYED_RELEASE_TABLET | Freq: Every day | ORAL | 11 refills | Status: AC

## 2021-05-03 MED ORDER — LOSARTAN POTASSIUM 25 MG PO TABS
25.0000 mg | ORAL_TABLET | Freq: Every day | ORAL | Status: DC
  Administered 2021-05-03: 25 mg via ORAL
  Filled 2021-05-03: qty 1

## 2021-05-03 MED ORDER — PANTOPRAZOLE SODIUM 40 MG PO TBEC: 40.0000 mg | DELAYED_RELEASE_TABLET | Freq: Every day | ORAL | 0 refills | Status: DC

## 2021-05-03 MED ORDER — MORPHINE SULFATE (PF) 2 MG/ML IV SOLN: 0.5000 mg | Freq: Once | INTRAVENOUS | Status: DC

## 2021-05-03 MED ORDER — ATORVASTATIN CALCIUM 80 MG PO TABS: 80.0000 mg | ORAL_TABLET | Freq: Every day | ORAL | 0 refills | Status: DC

## 2021-05-03 MED ORDER — TICAGRELOR 90 MG PO TABS: 90.0000 mg | ORAL_TABLET | Freq: Two times a day (BID) | ORAL | 0 refills | Status: DC

## 2021-05-03 NOTE — Discharge Instructions (Signed)
You have received care from [facility] during this hospital stay and we look forward to continuing to provide you with excellent care in our office settings after you've left the hospital.  In order to assure a smoother transition to home following your discharge from the hospital, we will likely have you see one of our nurse practitioners or physician assistants within a few weeks of discharge.  Our advanced practice providers work closely with your physician in order to address all of your heart's needs in a timely manner.  More information about all of our providers may be found here: [email or web address]   Please plan to bring all of your prescriptions to your follow-up appointment and don't hesitate to contact us with questions or concerns.  [facility] - [phone] [address] [place], [place] [address] --------------------------------------  NSTEMI: No driving for 1 week. No heavy lifting for 2 weeks. No sexual activity for 2 weeks. You may return to work based on recommendations at office follow-up. Keep procedure site clean & dry. If you notice increased pain, swelling, bleeding or pus, call/return!  You may shower, but no soaking baths/hot tubs/pools for ~1 week.   After your catheterization Refer to this sheet in the next few weeks. These instructions provide you with information on caring for yourself after your procedure. Your caregiver may also give you more specific instructions. Your treatment has been planned according to current medical practices, but problems sometimes occur. Call your caregiver if you have any problems or questions after your procedure. HOME CARE INSTRUCTIONS You may shower the day after the procedure. Remove the bandage (dressing) and gently wash the site with plain soap and water. Gently pat the site dry.  Do not apply powder or lotion to the  site.  Do not submerge the affected site in water for 3 to 5 days.  Inspect the site at least twice daily.  Do not flex or bend the affected arm for 24 hours.  No lifting over 5 pounds (2.3 kg) for 5 days after your procedure.  What to expect: Any bruising will usually fade within 1 to 2 weeks.  Blood that collects in the tissue (hematoma) may be painful to the touch. It should usually decrease in size and tenderness within 1 to 2 weeks.  SEEK IMMEDIATE MEDICAL CARE IF: You have unusual pain at the radial site.  You have redness, warmth, swelling, or pain at the radial site.  You have drainage (other than a small amount of blood on the dressing).  You have chills.  You have a fever or persistent symptoms for more than 72 hours.  You have a fever and your symptoms suddenly get worse.  Your arm becomes pale, cool, tingly, or numb.  You have heavy bleeding from the site. Hold pressure on the site.      10 Habits of Highly Healthy People  [facility] wants to help you get well and stay well.  Live a longer, healthier life by practicing healthy habits every day.  1.  Visit your primary care provider regularly. 2.  Make time for family and friends.  Healthy relationships are important. 3.  Take medications as directed by your provider. 4.  Maintain a healthy weight and a trim waistline. 5.  Eat healthy meals and snacks, rich in fruits, vegetables, whole grains, and lean proteins. 6.  Get moving every day - aim for 150 minutes of moderate physical activity each week.  7.  Don't smoke. 8.  Avoid alcohol or drink in moderation. 9.  Manage stress through meditation or mindful relaxation. 10.  Get seven to nine hours of quality sleep each night.  Want more information on healthy habits?  To learn more about these and other healthy habits, visit [email or web address].

## 2021-05-03 NOTE — Progress Notes (Addendum)
Inpatient Diabetes Program Recommendations  AACE/ADA: New Consensus Statement on Inpatient Glycemic Control (2015)  Target Ranges:  Prepandial:   less than 140 mg/dL      Peak postprandial:   less than 180 mg/dL (1-2 hours)      Critically ill patients:  140 - 180 mg/dL   Results for Karen Ruiz, Karen Ruiz (MRN [id number]) as of 05/03/2021 07:26  Ref. Range 05/02/2021 05:19 05/02/2021 07:56 05/02/2021 09:55 05/02/2021 13:21 05/02/2021 16:55 05/02/2021 21:17  Glucose-Capillary Latest Ref Range: 70 - 99 mg/dL 387 (H)  9 units NOVOLOG  321 (H)  7 units NOVOLOG  305 (H) 273 (H) 323 (H)  7 units NOVOLOG  234 (H)  3 units NOVOLOG   Results for Karen Ruiz, Karen Ruiz (MRN [id number]) as of 05/03/2021 07:26  Ref. Range 05/03/2021 00:28 05/03/2021 04:55  Glucose-Capillary Latest Ref Range: 70 - 99 mg/dL 309 (H)  7 units NOVOLOG  258 (H)  5 units NOVOLOG    Results for Karen Ruiz, Karen Ruiz (MRN [id number]) as of 05/03/2021 07:26  Ref. Range 07/15/2019 10:42 05/02/2021 08:02  Hemoglobin A1C Latest Ref Range: 4.8 - 5.6 % 10.0 (H) 10.0 (H)  (240 mg/dl)    Admit with: NSTEMI  History: DM  Home DM Meds: Novolog 11 units BID  Current Orders: Semglee 10 units Daily     Novolog Sensitive Correction Scale/ SSI (0-9 units) Q4 hours    Semglee ordered yesterday but 1st dose Never given--Will get 1st dose Semglee on board this AM  Cardiac Cath yesterday   MD- Patient told me she is only taking Novolog once a day at bedtime (takes anywhere from 11-30 units).  No basal insulin at home and Current A1c is 10%.  Is willing to take basal insulin at home and change her Novolog regimen to TID with meals to get better control.  --Please consider Starting Lantus for home 15 units Daily (0.15 units/kg) Prescribe Lantus Insulin Pen- Order # [id number] Insulin Pen Needles- Order # [id number]  --Please also consider adjusting her Novolog at home to TID with meals per the 0-9 unit scale from the hospital 70-120- 0  units 121-150- 1 unit 151-200- 2 units 201-250- 3 units 251-300- 5 units 301-350- 7 units 351-400- 9 units >400- 9 units and recheck CBG in 1 hour at home    Addendum 10:10am--Met w/ pt at bedside.  Pt told me she is only taking Novolog at bedtime once daily--takes anywhere between 11-30 units.  Clarified with pt that the insulin is truly Novolog and not Novolog 70/30 Mix insulin.  Reviewed current A1c of 10% with pt and importance of getting her A1c closer to 7% given she just had an MI and to prevent further cardiac and other long-term complications.  Reviewed goal A1c and goal CBGs for home.  Strongly encouraged pt to check her CBGs TID before meals at home and also encouraged pt to schedule follow up appt with PCP.  Discussed with pt that she may want to consider seeking care under an ENDO given her young age and current MI.  Pt willing to take Novolog SSI TID with meals at home and also willing to start long-acting insulin at home once daily if needed to get better CBG control.  Pt asking me about Ozempic--Explained what Ozempic is, how it works, side effects, etc.  Encouraged pt to discuss Ozempic further with her PCP.    --Will follow patient during hospitalization--  Wyn Quaker RN, MSN, CDE Diabetes Coordinator Inpatient Glycemic Control Team Team  Pager: [phone] (8a-5p)

## 2021-05-03 NOTE — Telephone Encounter (Signed)
-----   Message from Dalia Heading sent at 05/03/2021  9:53 AM EDT ----- Regarding: TOC 8/17 2 pm with Cadence Fransico Michael, PA

## 2021-05-03 NOTE — Progress Notes (Signed)
Progress Note  Patient Name: Karen Ruiz Date of Encounter: 05/03/2021  Primary Cardiologist: [facility], Dr. Okey DupreEnd  Subjective   No chest pain or shortness of breath.  Doing well after the catheterization. Right radial arteriotomy site with bandage clean, dry, and intact. Denies any pain from her catheterization site.  Reviewed her catheterization and echo findings. Reviewed post cath instructions. Reviewed her medications. Discussed Brilinta card /follow-up.  Inpatient Medications    Scheduled Meds:  aspirin EC  81 mg Oral Daily   atorvastatin  80 mg Oral Daily   enoxaparin (LOVENOX) injection  0.5 mg/kg Subcutaneous Q24H   insulin aspart  0-9 Units Subcutaneous Q4H   insulin glargine-yfgn  10 Units Subcutaneous Daily   metoprolol tartrate  50 mg Oral BID   pantoprazole  40 mg Oral Daily   sodium chloride flush  3 mL Intravenous Q12H   sodium chloride flush  3 mL Intravenous Q12H   ticagrelor  90 mg Oral BID   Continuous Infusions:  sodium chloride     sodium chloride     PRN Meds: sodium chloride, sodium chloride, acetaminophen, alum & mag hydroxide-simeth, diphenhydrAMINE, morphine injection, ondansetron (ZOFRAN) IV, sodium chloride flush, sodium chloride flush   Vital Signs    Vitals:   05/03/21 0024 05/03/21 0027 05/03/21 0452 05/03/21 0738  BP:  126/82 121/63 (!) 142/91  Pulse: 77 74 80 78  Resp:  18 20 18   Temp:  98 F (36.7 C) 98 F (36.7 C) 97.9 F (36.6 C)  TempSrc:   Oral   SpO2: 97% 98% 95% 99%  Weight:      Height:        Intake/Output Summary (Last 24 hours) at 05/03/2021 0841 Last data filed at 05/02/2021 2131 Gross per 24 hour  Intake 56.81 ml  Output --  Net 56.81 ml   Last 3 Weights 05/02/2021 05/02/2021 03/26/2021  Weight (lbs) 240 lb 240 lb 245 lb  Weight (kg) 108.863 kg 108.863 kg 111.131 kg      Telemetry    NSR 70-90s - Personally Reviewed  ECG    NSR, 75 bpm, Q waves noted in inferior lead/lead II, low voltage QRS -  Personally Reviewed  Physical Exam   GEN: No acute distress.  Lying in bed watching TV. Neck: JVD difficult to assess due to body habitus Cardiac: RRR, no murmurs, rubs, or gallops.  Respiratory: Distant breath sounds.  Clear to auscultation bilaterally. GI: Soft, nontender, non-distended  MS: No edema; No deformity.  Rash still present but improved from yesterday. Neuro:  Nonfocal  Psych: Normal affect   Labs    High Sensitivity Troponin:   Recent Labs  Lab 05/02/21 0025 05/02/21 0513 05/02/21 0802  TROPONINIHS 224* 233* 349*      Chemistry Recent Labs  Lab 05/02/21 0025 05/02/21 0513 05/03/21 0537  NA 130*  --  132*  K 4.4  --  3.9  CL 101  --  101  CO2 20*  --  24  GLUCOSE 324*  --  284*  BUN 14  --  11  CREATININE 0.73  --  0.68  CALCIUM 8.8*  --  8.5*  PROT  --  6.9  --   ALBUMIN  --  3.5  --   AST  --  22  --   ALT  --  35  --   ALKPHOS  --  74  --   BILITOT  --  0.8  --   GFRNONAA >60  --  >  60  ANIONGAP 9  --  7     Hematology Recent Labs  Lab 05/02/21 0025 05/03/21 0537  WBC 20.1* 11.8*  RBC 5.79* 5.00  HGB 16.9* 14.4  HCT 48.4* 42.8  MCV 83.6 85.6  MCH 29.2 28.8  MCHC 34.9 33.6  RDW 14.6 14.6  PLT 398 292    BNPNo results for input(s): BNP, PROBNP in the last 168 hours.   DDimer No results for input(s): DDIMER in the last 168 hours.   Radiology    DG Chest 2 View  Result Date: 05/02/2021 CLINICAL DATA:  Chest pain for several days. EXAM: CHEST - 2 VIEW COMPARISON:  None. FINDINGS: The cardiomediastinal contours are normal. The lungs are clear. Pulmonary vasculature is normal. No consolidation, pleural effusion, or pneumothorax. No acute osseous abnormalities are seen. IMPRESSION: Negative radiographs of the chest. Electronically Signed   By: Narda Rutherford M.D.   On: 05/02/2021 00:51   CT Angio Chest PE W and/or Wo Contrast  Result Date: 05/02/2021 CLINICAL DATA:  mid-sternal chest pain that radiates down left arm that has been  going on for a few days but has become worse today. Pt denies heart problems. Pt states pain feels like a pressure/burning in nature. EXAM: CT ANGIOGRAPHY CHEST WITH CONTRAST TECHNIQUE: Multidetector CT imaging of the chest was performed using the standard protocol during bolus administration of intravenous contrast. Multiplanar CT image reconstructions and MIPs were obtained to evaluate the vascular anatomy. CONTRAST:  OMNIPAQUE IOHEXOL 350 MG/ML SOLN COMPARISON:  Chest x-ray 05/02/2021, CT chest 07/17/2006 FINDINGS: Cardiovascular: Satisfactory opacification of the pulmonary arteries to the segmental level. No evidence of pulmonary embolism. Normal heart size. No pericardial effusion. Mediastinum/Nodes: No enlarged mediastinal, hilar, or axillary lymph nodes. Asymmetric right thyroid gland. No definite hypodense nodule or calcification. The trachea and esophagus demonstrate no significant findings. Tiny hiatal hernia. Lungs/Pleura: No focal consolidation. No pulmonary nodule. No pulmonary mass. No pleural effusion. No pneumothorax. Upper Abdomen: No acute abnormality. Musculoskeletal: No chest wall abnormality. No suspicious lytic or blastic osseous lesions. No acute displaced fracture. Review of the MIP images confirms the above findings. IMPRESSION: No pulmonary embolus. No acute intrapulmonary abnormality Tiny hiatal hernia. Electronically Signed   By: Tish Frederickson M.D.   On: 05/02/2021 02:52   CARDIAC CATHETERIZATION  Result Date: 05/02/2021 Conclusions: Severe two-vessel coronary artery disease including diffuse ostial through mid LAD plaquing of up to 60% followed by occlusion of the apical LAD with left to left and right to left collaterals supplying the wraparound segment, as well as long segment of disease in the proximal/mid RCA with focal ulceration and up to 80% stenosis.  There is mild to moderate, nonobstructive disease involving the LCx. Normal to hyperdynamic left ventricular systolic  function with apical inferior akinesis.  LVEDP normal. Successful IVUS guided PCI to the proximal/mid RCA using Onyx Frontier 3.0 x 30 mm drug-eluting stent (postdilated to 3.6 mm) with 0% residual stenosis and TIMI-3 flow. Recommendations: Overnight observation. Dual antiplatelet therapy with aspirin and ticagrelor for at least 12 months. Aggressive secondary prevention including high intensity statin therapy, excellent glycemic control, and tobacco/marijuana cessation. Medical management of apical LAD occlusion, which is too small/distal for intervention.  Also recommend medical therapy for a long segment of disease in the ostial through mid LAD. Follow-up echocardiogram.   ECHOCARDIOGRAM COMPLETE  Result Date: 05/03/2021    ECHOCARDIOGRAM REPORT   Patient Name:   Karen Ruiz Date of Exam: 05/02/2021 Medical Rec #:  [id number]  Height:       66.0 in Accession #:    [id number]           Weight:       240.0 lb Date of Birth:  11-01-1980            BSA:          2.161 m Patient Age:    40 years             BP:           156/85 mmHg Patient Gender: F                    HR:           75 bpm. Exam Location:  [facility] Procedure: 2D Echo, Cardiac Doppler and Color Doppler Indications:     R07.9 Chest pain  History:         Patient has no prior history of Echocardiogram examinations.                  Risk Factors:Diabetes and Obesity.  Sonographer:     Sedonia Small Rodgers-Jones Referring Phys:  [id number] CHRISTOPHER END Diagnosing Phys: Yvonne Kendall MD IMPRESSIONS  1. Left ventricular ejection fraction, by estimation, is 60 to 65%. The left ventricle has normal function. The left ventricle demonstrates regional wall motion abnormalities (see scoring diagram/findings for description). The left ventricular internal cavity size was borderline dilated. Left ventricular diastolic parameters are consistent with Grade I diastolic dysfunction (impaired relaxation). There is mild hypokinesis of the left ventricular,  apical inferior segment and apical segment.  2. Right ventricular systolic function is low normal. The right ventricular size is normal. Tricuspid regurgitation signal is inadequate for assessing PA pressure.  3. The mitral valve is normal in structure. No evidence of mitral valve regurgitation. No evidence of mitral stenosis.  4. The aortic valve was not well visualized. Aortic valve regurgitation is not visualized. No aortic stenosis is present.  5. Aortic dilatation noted. There is mild dilatation of the aortic root, measuring 40 mm. FINDINGS  Left Ventricle: Left ventricular ejection fraction, by estimation, is 60 to 65%. The left ventricle has normal function. The left ventricle demonstrates regional wall motion abnormalities. Mild hypokinesis of the left ventricular, apical inferior segment and apical segment. The left ventricular internal cavity size was borderline dilated. There is no left ventricular hypertrophy. Left ventricular diastolic parameters are consistent with Grade I diastolic dysfunction (impaired relaxation). Right Ventricle: The right ventricular size is normal. No increase in right ventricular wall thickness. Right ventricular systolic function is low normal. Tricuspid regurgitation signal is inadequate for assessing PA pressure. Left Atrium: Left atrial size was normal in size. Right Atrium: Right atrial size was normal in size. Pericardium: There is no evidence of pericardial effusion. Mitral Valve: The mitral valve is normal in structure. No evidence of mitral valve regurgitation. No evidence of mitral valve stenosis. Tricuspid Valve: The tricuspid valve is grossly normal. Tricuspid valve regurgitation is not demonstrated. Aortic Valve: The aortic valve was not well visualized. Aortic valve regurgitation is not visualized. No aortic stenosis is present. Pulmonic Valve: The pulmonic valve was not well visualized. Pulmonic valve regurgitation is not visualized. No evidence of pulmonic  stenosis. Aorta: Aortic dilatation noted. There is mild dilatation of the aortic root, measuring 40 mm. Pulmonary Artery: The pulmonary artery is not well seen. Venous: The inferior vena cava was not well visualized. IAS/Shunts: The interatrial septum was not well visualized.  LEFT  VENTRICLE PLAX 2D LVIDd:         5.25 cm  Diastology LVIDs:         3.17 cm  LV e' medial:    7.83 cm/s LV PW:         0.96 cm  LV E/e' medial:  7.4 LV IVS:        0.84 cm  LV e' lateral:   9.36 cm/s LVOT diam:     1.90 cm  LV E/e' lateral: 6.2 LV SV:         54 LV SV Index:   25 LVOT Area:     2.84 cm  RIGHT VENTRICLE             IVC RV Basal diam:  2.61 cm     IVC diam: 1.27 cm RV S prime:     11.70 cm/s TAPSE (M-mode): 1.9 cm LEFT ATRIUM             Index       RIGHT ATRIUM          Index LA diam:        4.00 cm 1.85 cm/m  RA Area:     9.79 cm LA Vol (A2C):   32.6 ml 15.09 ml/m RA Volume:   19.80 ml 9.16 ml/m LA Vol (A4C):   36.6 ml 16.94 ml/m LA Biplane Vol: 34.9 ml 16.15 ml/m  AORTIC VALVE LVOT Vmax:   95.30 cm/s LVOT Vmean:  64.400 cm/s LVOT VTI:    0.189 m  AORTA Ao Root diam: 4.00 cm Ao Asc diam:  3.00 cm MITRAL VALVE MV Decel Time: 204 msec    SHUNTS MV E velocity: 58.30 cm/s  Systemic VTI:  0.19 m MV A velocity: 75.00 cm/s  Systemic Diam: 1.90 cm MV E/A ratio:  0.78 Cristal Deer End MD Electronically signed by Yvonne Kendall MD Signature Date/Time: 05/03/2021/5:44:57 AM    Final     Cardiac Studies   Echo 05/02/21  1. Left ventricular ejection fraction, by estimation, is 60 to 65%. The  left ventricle has normal function. The left ventricle demonstrates  regional wall motion abnormalities (see scoring diagram/findings for  description). The left ventricular internal  cavity size was borderline dilated. Left ventricular diastolic parameters  are consistent with Grade I diastolic dysfunction (impaired relaxation).  There is mild hypokinesis of the left ventricular, apical inferior segment  and apical segment.   2.  Right ventricular systolic function is low normal. The right  ventricular size is normal. Tricuspid regurgitation signal is inadequate  for assessing PA pressure.   3. The mitral valve is normal in structure. No evidence of mitral valve  regurgitation. No evidence of mitral stenosis.   4. The aortic valve was not well visualized. Aortic valve regurgitation  is not visualized. No aortic stenosis is present.   5. Aortic dilatation noted. There is mild dilatation of the aortic root,  measuring 40 mm.   LHC 05/02/21 Conclusions: Severe two-vessel coronary artery disease including diffuse ostial through mid LAD plaquing of up to 60% followed by occlusion of the apical LAD with left to left and right to left collaterals supplying the wraparound segment, as well as long segment of disease in the proximal/mid RCA with focal ulceration and up to 80% stenosis.  There is mild to moderate, nonobstructive disease involving the LCx. Normal to hyperdynamic left ventricular systolic function with apical inferior akinesis.  LVEDP normal. Successful IVUS guided PCI to the proximal/mid RCA using Onyx Frontier  3.0 x 30 mm drug-eluting stent (postdilated to 3.6 mm) with 0% residual stenosis and TIMI-3 flow. Recommendations: Overnight observation. Dual antiplatelet therapy with aspirin and ticagrelor for at least 12 months. Aggressive secondary prevention including high intensity statin therapy, excellent glycemic control, and tobacco/marijuana cessation. Medical management of apical LAD occlusion, which is too small/distal for intervention.  Also recommend medical therapy for a long segment of disease in the ostial through mid LAD. Follow-up echocardiogram.  Coronary Diagrams   Diagnostic Dominance: Right    Intervention     Patient Profile     40 y.o. female with history of severe 2v CAD s/p PCI to the RCA, NSTEMI 8/2, diastolic dysfunction, mildly dilated aortic root (40 mm, 05/02/2021), hypertension,  DM2, Barrett's esophagus, polysubstance use (tobacco, marijuana), and family history of premature ASCVD admitted with NSTEMI and underwent cardiac catheterization 05/02/2021 with severe two-vessel CAD and catheterization details as above.  Assessment & Plan    Severe 2v CAD s/p  PCI  to RCA NSTEMI --No current chest pain.  Presented with NSTEMI and LHC performed same day with PCI to RCA. Right radial arteriotomy site is without pain and dressing clean, dry, and intact.  Reviewed instructions postcatheterization and recommendations for follow-up and medications.  I will also add this information to her AVS.  05/02/2021 catheterization showed severe 2v CAD with details as copied and pasted above. Echo also as above. --Continue DAPT with ASA and ticagrelor for at least 12 months. Continue PPI. Will ensure provided with Brilinta card.  --Continue aggressive secondary prevention, including intensive statin therapy, glycemic control, tobacco/marijuana cessation. Continue atorvastatin 80 mg daily with repeat lipid and liver function in 6 to 8 weeks. Consolidated BB and added Losartan as below for HTN control, rate control. Medical management of the apical LAD occlusion and long segment of disease in the ostial through mid LAD recommended. --Recommend follow-up in the clinic within 1 to 2 weeks of discharge. --Pending MD to see patient, as well as recheck of BP/HR after AM medications, she is likely stable to go home from a cardiac standpoint.  Diastolic dysfunction --No shortness of breath.  Euvolemic on exam. Cath with nl LVEDP. Not currently on standing diuretic. Echo showed EF 60 to 65%, LV regional wall motion abnormalities, LV borderline dilated, G1 DD, mild hypokinesis of left ventricular, apical inferior segment, and apical segment, RV SF low normal.   --Started Toprol  daily, discontinued lopressor  BID (consolidated BB).   --Started Losartan  daily.  Follow-up BMET recommended in 1-2  weeks.  --Not currently on a standing diuretic.   Continue to reassess volume status at follow-up.  Mildly dilated aortic root --Avoid fluoroquinolones, heavy lifting.  Continue to monitor with periodic imaging.  HR, BP, LDL control recommended.   Hypertension --Suboptimal BP this AM. --Started Toprol  daily, discontinued lopressor  BID (consolidated BB).   --Started Losartan  daily given comorbid DM2 and room in BP this AM. --Recommend follow-up BMET at RTC.   Maculopapular rash --Unclear etiology. Tx per IM.   DM2 --Sub-optimal glucose this AM. Recommend PCP/Endocrinology follow-up. SSI, per IM. Started Losartan as above given comorbid DM2.   Tobacco use, marijuana use --Smoking cessation recommended.   For questions or updates, please contact [facility] Please consult [email or web address] for contact info under        Signed, Lennon Alstrom, PA-C  05/03/2021, 8:41 AM

## 2021-05-03 NOTE — Plan of Care (Signed)
  Problem: Education: Goal: Knowledge of General Education information will improve Description: Including pain rating scale, medication(s)/side effects and non-pharmacologic comfort measures Outcome: Progressing   Problem: Health Behavior/Discharge Planning: Goal: Ability to manage health-related needs will improve Outcome: Progressing   Problem: Clinical Measurements: Goal: Cardiovascular complication will be avoided Outcome: Progressing   Problem: Pain Managment: Goal: General experience of comfort will improve Outcome: Progressing   

## 2021-05-03 NOTE — Progress Notes (Addendum)
Pt is complaining of 7/10 H/A took tylenol at 2129. MD Toniann Fail made aware. Will continue to monitor.  Update 0130: MD Toniann Fail order to give morphine PRN for headache. Will continue to monitor.

## 2021-05-03 NOTE — Telephone Encounter (Signed)
Reviewed the patient's chart. She is currently still admitted.  Admitted 05/02/21- chest pain x 2 days Elevated troponin>> NSTEMI  Cath done 05/02/21 by Dr. Okey Dupre: Conclusions: Severe two-vessel coronary artery disease including diffuse ostial through mid LAD plaquing of up to 60% followed by occlusion of the apical LAD with left to left and right to left collaterals supplying the wraparound segment, as well as long segment of disease in the proximal/mid RCA with focal ulceration and up to 80% stenosis.  There is mild to moderate, nonobstructive disease involving the LCx. Normal to hyperdynamic left ventricular systolic function with apical inferior akinesis.  LVEDP normal. Successful IVUS guided PCI to the proximal/mid RCA using Onyx Frontier 3.0 x 30 mm drug-eluting stent (postdilated to 3.6 mm) with 0% residual stenosis and TIMI-3 flow.    Will review discharge status tomorrow for 1st attempt TCM call.

## 2021-05-04 NOTE — Discharge Summary (Signed)
5       [place] [facility]   PATIENT NAME: Karen Ruiz    MR#:  [id number]  DATE OF BIRTH:  16-Jan-1981  DATE OF ADMISSION:  05/02/2021   ADMITTING PHYSICIAN: Eduard Clos, MD  DATE OF DISCHARGE: 05/03/2021  2:08 PM  PRIMARY CARE PHYSICIAN: Marina Goodell, MD   ADMISSION DIAGNOSIS:  NSTEMI (non-ST elevated myocardial infarction) (HCC) [I21.4] Non-ST elevated myocardial infarction (HCC) [I21.4] DISCHARGE DIAGNOSIS:  Principal Problem:   Non-ST elevated myocardial infarction [facility]) Active Problems:   Diabetes type 2, uncontrolled (HCC)   NSTEMI (non-ST elevated myocardial infarction) (HCC)   Type 2 diabetes mellitus with hyperlipidemia (HCC)   Coronary artery disease involving native coronary artery of native heart without angina pectoris  SECONDARY DIAGNOSIS:   Past Medical History:  Diagnosis Date   Barrett esophagus    Diabetes mellitus without complication (HCC)    Gastroparesis    Migraines    MRSA (methicillin resistant staph aureus) culture positive    2009   Obesity    HOSPITAL COURSE:  40 year old female with a known history of type 2 diabetes, Barrett's esophagus, polysubstance abuse was admitted for non-STEMI.  Non-STEMI Severe two-vessel coronary disease status post PCI to RCA Patient is chest pain free at discharge S/p cath on 8/2 showing severe two-vessel coronary disease with successful stenting to mid RCA.  Occluded distal LAD -Will need DAPT with aspirin and ticagrelor for at least 12 months at discharge.  Lipitor 80 mg p.o. daily, losartan and Toprol-XL at discharge. Echo shows normal LV systolic function.  EF of 60 to 65%.  Mild diastolic dysfunction -Cardiac rehab  Diabetes mellitus type 2 Outpatient follow-up with PCP/endocrinology for further evaluation and treatment  Essential hypertension Well-controlled.  Continue Toprol-XL and losartan at discharge  Smoking cessation was advised   DISCHARGE CONDITIONS:   Stable CONSULTS OBTAINED:  Treatment Team:  Yvonne Kendall, MD DRUG ALLERGIES:  No Known Allergies DISCHARGE MEDICATIONS:   Allergies as of 05/03/2021   No Known Allergies      Medication List     STOP taking these medications    predniSONE 10 MG (21) Tbpk tablet Commonly known as: STERAPRED UNI-PAK 21 TAB       TAKE these medications    aspirin 81 MG EC tablet Take 1 tablet (81 mg total) by mouth daily. Swallow whole.   atorvastatin 80 MG tablet Commonly known as: LIPITOR Take 1 tablet (80 mg total) by mouth daily.   insulin aspart 100 UNIT/ML injection Commonly known as: novoLOG Inject 0-9 Units into the skin 3 (three) times daily with meals. What changed:  how much to take when to take this   losartan 25 MG tablet Commonly known as: COZAAR Take 1 tablet (25 mg total) by mouth daily.   metoprolol succinate 100 MG 24 hr tablet Commonly known as: TOPROL-XL Take 1 tablet (100 mg total) by mouth daily. Take with or immediately following a meal.   pantoprazole 40 MG tablet Commonly known as: PROTONIX Take 1 tablet (40 mg total) by mouth daily.   ticagrelor 90 MG Tabs tablet Commonly known as: BRILINTA Take 1 tablet (90 mg total) by mouth 2 (two) times daily.       DISCHARGE INSTRUCTIONS:   DIET:  Cardiac diet DISCHARGE CONDITION:  Stable ACTIVITY:  Activity as tolerated OXYGEN:  Home Oxygen: No.  Oxygen Delivery: room air DISCHARGE LOCATION:  home   If you experience worsening of your admission symptoms, develop shortness of  breath, life threatening emergency, suicidal or homicidal thoughts you must seek medical attention immediately by calling 911 or calling your MD immediately  if symptoms less severe.  You Must read complete instructions/literature along with all the possible adverse reactions/side effects for all the Medicines you take and that have been prescribed to you. Take any new Medicines after you have completely understood and  accpet all the possible adverse reactions/side effects.   Please note  You were cared for by a hospitalist during your hospital stay. If you have any questions about your discharge medications or the care you received while you were in the hospital after you are discharged, you can call the unit and asked to speak with the hospitalist on call if the hospitalist that took care of you is not available. Once you are discharged, your primary care physician will handle any further medical issues. Please note that NO REFILLS for any discharge medications will be authorized once you are discharged, as it is imperative that you return to your primary care physician (or establish a relationship with a primary care physician if you do not have one) for your aftercare needs so that they can reassess your need for medications and monitor your lab values.    On the day of Discharge:  VITAL SIGNS:  Blood pressure 119/76, pulse 71, temperature (!) 97.5 F (36.4 C), temperature source Oral, resp. rate 18, height 5\' 6"  (1.676 m), weight 108.9 kg, last menstrual period 05/01/2021, SpO2 98 %. PHYSICAL EXAMINATION:  GENERAL:  40 y.o.-year-old patient lying in the bed with no acute distress.  EYES: Pupils equal, round, reactive to light and accommodation. No scleral icterus. Extraocular muscles intact.  HEENT: Head atraumatic, normocephalic. Oropharynx and nasopharynx clear.  NECK:  Supple, no jugular venous distention. No thyroid enlargement, no tenderness.  LUNGS: Normal breath sounds bilaterally, no wheezing, rales,rhonchi or crepitation. No use of accessory muscles of respiration.  CARDIOVASCULAR: S1, S2 normal. No murmurs, rubs, or gallops.  ABDOMEN: Soft, non-tender, non-distended. Bowel sounds present. No organomegaly or mass.  EXTREMITIES: No pedal edema, cyanosis, or clubbing.  NEUROLOGIC: Cranial nerves II through XII are intact. Muscle strength 5/5 in all extremities. Sensation intact. Gait not checked.   PSYCHIATRIC: The patient is alert and oriented x 3.  SKIN: No obvious rash, lesion, or ulcer.  DATA REVIEW:   CBC Recent Labs  Lab 05/03/21 0537  WBC 11.8*  HGB 14.4  HCT 42.8  PLT 292    Chemistries  Recent Labs  Lab 05/02/21 0513 05/03/21 0537  NA  --  132*  K  --  3.9  CL  --  101  CO2  --  24  GLUCOSE  --  284*  BUN  --  11  CREATININE  --  0.68  CALCIUM  --  8.5*  AST 22  --   ALT 35  --   ALKPHOS 74  --   BILITOT 0.8  --      Outpatient follow-up  Follow-up Information     End, Christopher, MD. Schedule an appointment as soon as possible for a visit on 05/17/2021.   Specialty: Cardiology Why: [facility] Discharge [email or web address]@ 2pm Contact information: [address] [place] [phone]                 30 Day Unplanned Readmission Risk Score    Flowsheet Row ED to Hosp-Admission (Discharged) from 05/02/2021 in [facility] PCU  30 Day Unplanned Readmission  Risk Score (%) 12.61 Filed at 05/03/2021 1200       This score is the patient's risk of an unplanned readmission within 30 days of being discharged (0 -100%). The score is based on dignosis, age, lab data, medications, orders, and past utilization.   Low:  0-14.9   Medium: 15-21.9   High: 22-29.9   Extreme: 30 and above          Management plans discussed with the patient, family and they are in agreement.  CODE STATUS: Prior   TOTAL TIME TAKING CARE OF THIS PATIENT: 45 minutes.    Delfino Lovett M.D on 05/04/2021 at 5:37 PM  [facility]   CC: Primary care physician; Marina Goodell, MD   Note: This dictation was prepared with Dragon dictation along with smaller phrase technology. Any transcriptional errors that result from this process are unintentional.

## 2021-05-05 NOTE — Telephone Encounter (Signed)
Patient contacted regarding discharge from [facility] on 05/03/21.  Patient understands to follow up with provider Cadence Fransico Michael, PA on 8/17 at 2:00 pm at [facility] office . Patient understands discharge instructions? Yes Patient understands medications and regiment? Yes Patient understands to bring all medications to this visit? Yes  Ask patient:  Are you enrolled in My Chart (yes or no)  If no ask patient if they would like to enroll.  Patient was sent a sign up code via text. She had no further questions or concerns and thanked me for the call.

## 2021-05-08 ENCOUNTER — Ambulatory Visit (INDEPENDENT_AMBULATORY_CARE_PROVIDER_SITE_OTHER): Payer: Medicaid Other | Admitting: Physician Assistant

## 2021-05-08 ENCOUNTER — Other Ambulatory Visit: Payer: Self-pay

## 2021-05-08 ENCOUNTER — Encounter: Payer: Self-pay | Admitting: Physician Assistant

## 2021-05-08 VITALS — BP 120/70 | HR 80 | Ht 66.0 in | Wt 272.0 lb

## 2021-05-08 DIAGNOSIS — Z720 Tobacco use: Secondary | ICD-10-CM | POA: Diagnosis not present

## 2021-05-08 DIAGNOSIS — K2270 Barrett's esophagus without dysplasia: Secondary | ICD-10-CM

## 2021-05-08 DIAGNOSIS — I2510 Atherosclerotic heart disease of native coronary artery without angina pectoris: Secondary | ICD-10-CM | POA: Diagnosis not present

## 2021-05-08 DIAGNOSIS — R21 Rash and other nonspecific skin eruption: Secondary | ICD-10-CM

## 2021-05-08 DIAGNOSIS — I5189 Other ill-defined heart diseases: Secondary | ICD-10-CM

## 2021-05-08 DIAGNOSIS — F1290 Cannabis use, unspecified, uncomplicated: Secondary | ICD-10-CM

## 2021-05-08 DIAGNOSIS — E1165 Type 2 diabetes mellitus with hyperglycemia: Secondary | ICD-10-CM

## 2021-05-08 DIAGNOSIS — I77810 Thoracic aortic ectasia: Secondary | ICD-10-CM

## 2021-05-08 DIAGNOSIS — I10 Essential (primary) hypertension: Secondary | ICD-10-CM | POA: Diagnosis not present

## 2021-05-08 DIAGNOSIS — I252 Old myocardial infarction: Secondary | ICD-10-CM

## 2021-05-08 DIAGNOSIS — E785 Hyperlipidemia, unspecified: Secondary | ICD-10-CM

## 2021-05-08 DIAGNOSIS — I214 Non-ST elevation (NSTEMI) myocardial infarction: Secondary | ICD-10-CM

## 2021-05-08 NOTE — Patient Instructions (Addendum)
Information About Your aortic root dilation  This refers to a bulge in an artery (blood vessel). Most people think of them in the context of an emergency, but yours was found incidentally. At this point there is nothing you need to do from a procedure standpoint, but there are some important things to keep in mind for day-to-day life.  Mainstays of therapy for aneurysms include very good blood pressure control, healthy lifestyle, and avoiding tobacco products and street drugs. Research has raised concern that antibiotics in the fluoroquinolone class could be associated with increased risk of having an aneurysm develop or tear. This includes medicines that end in "floxacin," like Cipro or Levaquin. Make sure to discuss this information with other healthcare providers if you require antibiotics.  Since aneurysms can run in families, you should discuss your diagnosis with first degree relatives as they may need to be screened for this. Regular mild-moderate physical exercise is important, but avoid heavy lifting/weight lifting over 30lbs, chopping wood, shoveling snow or digging heavy earth with a shovel. It is best to avoid activities that cause grunting or straining (medically referred to as a "Valsalva maneuver"). This happens when a person bears down against a closed throat to increase the strength of arm or abdominal muscles. There's often a tendency to do this when lifting heavy weights, doing sit-ups, push-ups or chin-ups, etc., but it may be harmful.  This is a finding  to be monitored periodically by your cardiology team. Most unruptured thoracic aortic aneurysms cause no symptoms, so they are often found during exams for other conditions. Contact a health care provider if you develop any discomfort in your upper back, neck, abdomen, trouble swallowing, cough or hoarseness, or unexplained weight loss. Get help right away if you develop severe pain in your upper back or abdomen that may move  into your chest and arms, or any other concerning symptoms such as shortness of breath or fever.   Information about your A1C One of your lab tests indicated that you may have diabetes (high blood sugar). Please contact your primary care provider to discuss the next step in evaluation and treatment.    Blood pressure: --We recommend upper arm BP cuffs over that of the wrist. --You can always check your device's accuracy against an office model once a year if possible. You can do so by bringing your cuff into the office and notifying the person that rooms you that you would like to check your cuff against our office readings. --Measure your BP at the same time each day. Blood pressure varies often throughout the day with higher readings in the morning. BP may also be lower at home than in the office. Do not measure BP right after you wake up or after exercising. Avoid caffeine, tobacco, and alcohol for 30 minutes before taking a measurement.  --Sit quietly for five minutes in a comfortable position with your legs and ankles uncrossed and back supported. Feet flat on the ground. Have your arm supported and at the level of your heart. Always use the same arm when taking your blood pressure. Place the cuff over bare skin rather than clothing. Each time you measure, take an additional reading if abnormal to ensure accurate by waiting 1-3 minutes after the first reading. --Please bring a BP log into the office.  --Goal BP is 130/80 or lower. If your BP is low but you are not dizzy, this is fine and preferred over BP higher than 130/80. If your  BP is less than 100 for the top number and you are dizzy, call the office. If your blood pressure is consistently elevated with top number above 180 and bottom above 120, this can damage the body. If you have severe increase in your blood pressure or concerning symptoms of severe chest pain, headache with confusion and blurred vision, severe abdominal or back pain,  shortness of breath, seizures, or loss of consciousness, go to the emergency department.    We recommend complete cessation of tobacco and marijuana.   Medication Instructions:  No changes at this time.   *If you need a refill on your cardiac medications before your next appointment, please call your pharmacy*   Lab Work: BMET today  If you have labs (blood work) drawn today and your tests are completely normal, you will receive your results only by: MyChart Message (if you have MyChart) OR A paper copy in the mail If you have any lab test that is abnormal or we need to change your treatment, we will call you to review the results.   Testing/Procedures: None   Follow-Up: At [facility], you and your health needs are our priority.  As part of our continuing mission to provide you with exceptional heart care, we have created designated Provider Care Teams.  These Care Teams include your primary Cardiologist (physician) and Advanced Practice Providers (APPs -  Physician Assistants and Nurse Practitioners) who all work together to provide you with the care you need, when you need it.  Your next appointment:   1 month(s)  The format for your next appointment:   In Person  Provider:   Marisue Ivan, PA-C

## 2021-05-08 NOTE — Progress Notes (Signed)
Office Visit    Patient Name: Karen Ruiz JJHERDEYCX Date of Encounter: 05/08/2021  PCP:  Marina Goodell, MD    [facility]  Cardiologist: Dr. Okey Dupre Advanced Practice Provider:  No care team member to display Electrophysiologist:  None [id number]}   Chief Complaint    Chief Complaint  Patient presents with   Hospitalization Follow-up    40 y.o. female diabetes, Barrett's esophagus, current tobacco/marijuana use, and no previously known history of heart disease who is being seen today for follow-up s/p of Non ST elevation myocardial infarction and recent catheterization.  Past Medical History    Past Medical History:  Diagnosis Date   Barrett esophagus    Diabetes mellitus without complication (HCC)    Gastroparesis    Migraines    MRSA (methicillin resistant staph aureus) culture positive    2009   Obesity    Past Surgical History:  Procedure Laterality Date   CARDIAC CATHETERIZATION     CESAREAN SECTION     CORONARY STENT INTERVENTION N/A 05/02/2021   Procedure: CORONARY STENT INTERVENTION;  Surgeon: Yvonne Kendall, MD;  Location: [facility] INVASIVE CV LAB;  Service: Cardiovascular;  Laterality: N/A;   ESOPHAGOGASTRODUODENOSCOPY N/A 07/28/2019   Procedure: ESOPHAGOGASTRODUODENOSCOPY (EGD);  Surgeon: Toney Reil, MD;  Location: [facility] ENDOSCOPY;  Service: Gastroenterology;  Laterality: N/A;   INTRAVASCULAR ULTRASOUND/IVUS N/A 05/02/2021   Procedure: Intravascular Ultrasound/IVUS;  Surgeon: Yvonne Kendall, MD;  Location: [facility] INVASIVE CV LAB;  Service: Cardiovascular;  Laterality: N/A;   LEFT HEART CATH AND CORONARY ANGIOGRAPHY N/A 05/02/2021   Procedure: LEFT HEART CATH AND CORONARY ANGIOGRAPHY;  Surgeon: Yvonne Kendall, MD;  Location: [facility] INVASIVE CV LAB;  Service: Cardiovascular;  Laterality: N/A;    Allergies  Allergies  Allergen Reactions   Quinolones Other (See Comments)    CI with Ao dilation    History of Present Illness     Karen Ruiz is a 40 y.o. female with PMH as above.   Karen Ruiz is a 40 year old female with PMH as above.  She has no previous history of cardiac disease or previous cardiac work-up. She has family history of CAD.  She currently uses tobacco and marijuana.  She occasionally to rarely drinks alcohol.   On 05/02/21, she presented to the ED with central/ substernal chest pain that started on Friday, 04/28/2021 and has been coming and going since that time. CP was described as a burning that initially was 8/10 in severity and occurred without clear triggers. CP present both with exertion and at rest. No other associated symptoms reported, other than weakness and fatigue in the week leading up to the CP, and during which time she had a full body rash. No CP like this before in the past.   For 1 week leading up to this CP, she had a full body maculopapular rash of unclear etiology that broke out over her entire body with the exception of her face. She denies experiencing a rash like this before in the past. No tick bites. No exposure to new detergents or known allergens. No new medications. She has been taking benadryl for this rash, which resulted in fatigue.  Given her family history of heart disease, she presented to [facility] ED 05/02/2021.  Initial BP 147/105.  Labs showed sodium 130 (corrected Na 134), glucose 324, calcium 8.8, hemoglobin 16.9, hematocrit 48.4. HS Tn 233.  Initial EKG sinus tachycardia at 127bpm, right axis deviation, nonspecific ST/T changes / repolarization changes.  Subsequent CTA was  without evidence of pulmonary embolus and showed a tiny hiatal hernia. She was started on IV heparin and nitro with ASA and IV morphine administered. Cardiology consulted with CP 5/10 in severity.  05/02/21 LHC with severe two-vessel CAD, including diffuse ostial through mid LAD plaquing up to 60%, followed by an occlusion of the apical LAD with left to left and right to left collaterals supplying  the wraparound segment, as well as a long segment of disease in the proximal/mid RCA with focal ulceration and up to 80% stenosis.  Mild to moderate nonobstructive disease involving the left circumflex is noted.  Normal to hyperdynamic LV SF with apical inferior akinesis was noted with LVEDP normal.  PCI to the proximal/mid RCA using DES was performed with 0% residual stenosis and TIMI-3 flow.    She was kept overnight for observation and doing well in the morning without any chest pain or shortness of breath.  Brilinta card provided.  Recommendation was for DAPT with ASA and ticagrelor for at least 12 months.  Aggressive secondary prevention with high intensity statin therapy and glycemic control, as well as tobacco/marijuana cessation recommended.  Medical management of apical LAD occlusion was recommended, given this vessel was too small and distal for intervention.  Also recommended was medical therapy for the long segment of disease in the ostial through mid LAD.  Follow-up echo was recommended and performed.    Echo showed EF 60 to 65%, regional wall motion abnormalities, borderline dilated LVE, G1 DD, mild hypokinesis of the LV/apical inferior segment and apical segment, RV SF low normal, aortic dilation measuring 40 mm.  Today, 05/08/2021, she returns to clinic and notes she has been doing well since her cardiac catheterization.  Right radial arteriotomy site inspected without evidence of hematoma or infection.  No pain and full range of motion reported.  She denies any chest pain or shortness of breath.  She denies nausea since her catheterization.  No tachypalpitations, presyncope, or syncope.  No signs or symptoms of volume overload.  She reports improving full body rash since her hospitalization and is still uncertain as to the etiology of her rash.  She has had a total of 2 cigarettes since home and is working to completely stop smoking tobacco.  She reports medication compliance.  Brilinta  prescription affordable.  No signs or symptoms of bleeding. Echo and cath results reviewed. Ao dilation results reviewed and explained.  Home Medications   Current Outpatient Medications  Medication Instructions   aspirin 81 mg, Oral, Daily, Swallow whole.   atorvastatin (LIPITOR) 80 mg, Oral, Daily   insulin aspart (NOVOLOG) 0-9 Units, Subcutaneous, 3 times daily with meals   losartan (COZAAR) 25 mg, Oral, Daily   metoprolol succinate (TOPROL-XL) 100 mg, Oral, Daily, Take with or immediately following a meal.   pantoprazole (PROTONIX) 40 mg, Oral, Daily   ticagrelor (BRILINTA) 90 mg, Oral, 2 times daily     Review of Systems    She denies chest pain, palpitations, dyspnea, pnd, orthopnea, n, v, dizziness, syncope, edema, weight gain, or early satiety.  She reports improving full body rash.   All other systems reviewed and are otherwise negative except as noted above.  Physical Exam    VS:  BP 120/70 (BP Location: Left Arm, Patient Position: Sitting, Cuff Size: Large)   Pulse 80   Ht 5\' 6"  (1.676 m)   Wt 272 lb (123.4 kg)   LMP 05/01/2021 (Exact Date) Comment: Preg. test Negative  SpO2 97%   BMI 43.90  kg/m  , BMI Body mass index is 43.9 kg/m. GEN: Well nourished, well developed, in no acute distress. HEENT: normal. Neck: Supple, no JVD, carotid bruits, or masses. Cardiac: RRR, no murmurs, rubs, or gallops. No clubbing, cyanosis, edema.  Radials/DP/PT 2+ and equal bilaterally.  Respiratory:  Respirations regular and unlabored, clear to auscultation bilaterally. GI: Soft, nontender, nondistended, BS + x 4. MS: no deformity or atrophy. Skin: warm and dry, no rash. Neuro:  Strength and sensation are intact. Psych: Normal affect.  Accessory Clinical Findings    ECG personally reviewed by me today -NSR, 80 bpm, low voltage QRS and poor R wave progression in inferior /precordial leads as seen in prior tracings- no acute changes.  VITALS Reviewed today   Temp Readings from Last  3 Encounters:  05/03/21 (!) 97.5 F (36.4 C) (Oral)  03/26/21 98 F (36.7 C)  07/29/19 98.5 F (36.9 C) (Oral)   BP Readings from Last 3 Encounters:  05/08/21 120/70  05/03/21 119/76  03/26/21 (!) 121/91   Pulse Readings from Last 3 Encounters:  05/08/21 80  05/03/21 71  03/26/21 93    Wt Readings from Last 3 Encounters:  05/08/21 272 lb (123.4 kg)  05/02/21 240 lb (108.9 kg)  03/26/21 245 lb (111.1 kg)     LABS  reviewed today   Lab Results  Component Value Date   WBC 11.8 (H) 05/03/2021   HGB 14.4 05/03/2021   HCT 42.8 05/03/2021   MCV 85.6 05/03/2021   PLT 292 05/03/2021   Lab Results  Component Value Date   CREATININE 0.68 05/03/2021   BUN 11 05/03/2021   NA 132 (L) 05/03/2021   K 3.9 05/03/2021   CL 101 05/03/2021   CO2 24 05/03/2021   Lab Results  Component Value Date   ALT 35 05/02/2021   AST 22 05/02/2021   ALKPHOS 74 05/02/2021   BILITOT 0.8 05/02/2021   Lab Results  Component Value Date   CHOL 215 (H) 05/02/2021   HDL 49 05/02/2021   LDLCALC 139 (H) 05/02/2021   TRIG 137 05/02/2021   CHOLHDL 4.4 05/02/2021    Lab Results  Component Value Date   HGBA1C 10.0 (H) 05/02/2021   Lab Results  Component Value Date   TSH 1.471 05/02/2021     STUDIES/PROCEDURES reviewed today   Echo 05/02/21  1. Left ventricular ejection fraction, by estimation, is 60 to 65%. The  left ventricle has normal function. The left ventricle demonstrates  regional wall motion abnormalities (see scoring diagram/findings for  description). The left ventricular internal  cavity size was borderline dilated. Left ventricular diastolic parameters  are consistent with Grade I diastolic dysfunction (impaired relaxation).  There is mild hypokinesis of the left ventricular, apical inferior segment  and apical segment.   2. Right ventricular systolic function is low normal. The right  ventricular size is normal. Tricuspid regurgitation signal is inadequate  for assessing  PA pressure.   3. The mitral valve is normal in structure. No evidence of mitral valve  regurgitation. No evidence of mitral stenosis.   4. The aortic valve was not well visualized. Aortic valve regurgitation  is not visualized. No aortic stenosis is present.   5. Aortic dilatation noted. There is mild dilatation of the aortic root,  measuring 40 mm.   LHC 05/02/21 Conclusions: Severe two-vessel coronary artery disease including diffuse ostial through mid LAD plaquing of up to 60% followed by occlusion of the apical LAD with left to left and right to  left collaterals supplying the wraparound segment, as well as long segment of disease in the proximal/mid RCA with focal ulceration and up to 80% stenosis.  There is mild to moderate, nonobstructive disease involving the LCx. Normal to hyperdynamic left ventricular systolic function with apical inferior akinesis.  LVEDP normal. Successful IVUS guided PCI to the proximal/mid RCA using Onyx Frontier 3.0 x 30 mm drug-eluting stent (postdilated to 3.6 mm) with 0% residual stenosis and TIMI-3 flow. Recommendations: Overnight observation. Dual antiplatelet therapy with aspirin and ticagrelor for at least 12 months. Aggressive secondary prevention including high intensity statin therapy, excellent glycemic control, and tobacco/marijuana cessation. Medical management of apical LAD occlusion, which is too small/distal for intervention.  Also recommend medical therapy for a long segment of disease in the ostial through mid LAD. Follow-up echocardiogram.     Intervention       Assessment & Plan    Severe 2v CAD s/p  PCI  to RCA NSTEMI --No current chest pain.  Presented with NSTEMI and LHC performed same day with PCI to RCA. Right radial arteriotomy site is without pain and dressing clean, dry, and intact.  05/02/2021 catheterization showed severe 2v CAD with details as copied and pasted above. Echo also as above. --Continue DAPT with ASA and  ticagrelor for at least 12 months. Continue PPI. Continue BB, ARB, statin.  --BMET ordered today, given Losartan added before discharge. --Continue aggressive secondary prevention, including intensive statin therapy, glycemic control, tobacco/marijuana cessation. Reviewed cath results and recommendation for medical management of the apical LAD occlusion and long segment of disease in the ostial through mid LAD. Heart healthy diet with lifestyle changes reviewed.    Diastolic dysfunction --No shortness of breath.  Euvolemic on exam. Cath with nl LVEDP. Not currently on standing diuretic. Echo showed EF 60 to 65%, LV regional wall motion abnormalities, LV borderline dilated, G1 DD, mild hypokinesis of left ventricular, apical inferior segment, and apical segment, RV SF low normal.  Continue BB, ARB. Will repeat a BMET given Losartan added before discharge. Not currently on a standing diuretic and no indication to start one at this time. Continue to reassess volume status at follow-up.   Mildly dilated aortic root --Discussed at length with information provided in AVS. She is aware to avoid fluoroquinolones, heavy lifting. Added FQ to list of intolerances.  Continue to monitor with periodic echo / imaging.  HR, BP, LDL control recommended. Cessation of tobacco use.    Hypertension --BP today 120/70 and improved with consolidation of beta-blocker to Toprol and addition of ARB/losartan 25 mg daily before discharge. Continue to monitor at home with goal BP 130/80 or less. Information provided in AVS regarding monitoring. Will repeat a BMET after adding her Losartan before discharge. Tobacco cessation recommended.    HLD, LDL goal below 70 --LDL 139 with goal LDL below 70. Total cholesterol 215. Continue current statin with recommendation for repeat lipid and liver function in 6 to 8 weeks.  Previous liver function WNL.  If LDL still not at goal at that time, addition of Zetia +/- PCSK9 inhibitors recommended  at that time.  Maculopapular rash, improved --Unclear etiology. Improving. Follow-up per PCP.   DM2 --A1C 10.0 on 8/2 labs. Information provided in AVS with recommendation for PCP/Endocrinology follow-up. Started Losartan before discharge given comorbid DM2. Will order repeat BMET.   Tobacco use, marijuana use --Complete cessation of smoking of marijuana / tobacco recommended.   Medication changes: None Labs ordered: BMET (losartan started before discharge) Studies / Imaging  ordered: None Future considerations: Cessation of marijuana? Tobacco? BP? Volume status?  Escalation of GDMT? Disposition: RTC 1 month  *Please be aware that the above documentation was completed voice recognition software and may contain dictation errors.     Lennon Alstrom, PA-C 05/08/2021

## 2021-05-09 LAB — BASIC METABOLIC PANEL
BUN/Creatinine Ratio: 18 (ref 9–23)
BUN: 10 mg/dL (ref 6–24)
CO2: 21 mmol/L (ref 20–29)
Calcium: 9.1 mg/dL (ref 8.7–10.2)
Chloride: 102 mmol/L (ref 96–106)
Creatinine, Ser: 0.57 mg/dL (ref 0.57–1.00)
Glucose: 367 mg/dL — ABNORMAL HIGH (ref 65–99)
Potassium: 4.4 mmol/L (ref 3.5–5.2)
Sodium: 136 mmol/L (ref 134–144)
eGFR: 116 mL/min/{1.73_m2} (ref >59)

## 2021-05-17 ENCOUNTER — Encounter: Payer: Medicaid Other | Attending: Internal Medicine

## 2021-05-17 ENCOUNTER — Ambulatory Visit: Payer: Medicaid Other | Admitting: Medical

## 2021-05-24 ENCOUNTER — Encounter: Payer: Self-pay | Admitting: *Deleted

## 2021-05-31 ENCOUNTER — Telehealth: Payer: Self-pay | Admitting: Physician Assistant

## 2021-05-31 MED ORDER — ATORVASTATIN CALCIUM 80 MG PO TABS: 80.0000 mg | ORAL_TABLET | Freq: Every day | ORAL | 1 refills | Status: DC

## 2021-05-31 MED ORDER — LOSARTAN POTASSIUM 25 MG PO TABS: 25.0000 mg | ORAL_TABLET | Freq: Every day | ORAL | 1 refills | Status: DC

## 2021-05-31 MED ORDER — TICAGRELOR 90 MG PO TABS: 90.0000 mg | ORAL_TABLET | Freq: Two times a day (BID) | ORAL | 1 refills | Status: AC

## 2021-05-31 MED ORDER — METOPROLOL SUCCINATE ER 100 MG PO TB24: 100.0000 mg | ORAL_TABLET | Freq: Every day | ORAL | 1 refills | Status: DC

## 2021-05-31 MED ORDER — PANTOPRAZOLE SODIUM 40 MG PO TBEC: 40.0000 mg | DELAYED_RELEASE_TABLET | Freq: Every day | ORAL | 1 refills | Status: DC

## 2021-05-31 NOTE — Addendum Note (Signed)
Addended by: Theola Sequin on: 05/31/2021 04:42 PM   Modules accepted: Orders

## 2021-05-31 NOTE — Telephone Encounter (Signed)
*  STAT* If patient is at the pharmacy, call can be transferred to refill team.   1. Which medications need to be refilled? (please list name of each medication and dose if known) Lipitor 80mg  1 tablet daily. Cozaar 25 mg 1 tablet daily Metoprolol 100 mg 24hr tablet 1 tablet daily Protonix 40 mg 1 tablet daily Brilinta 90 mg 1 tablet twice a day  2. Which pharmacy/location (including street and city if local pharmacy) is medication to be sent to? [facility] [place]  3. Do they need a 30 day or 90 day supply? 90 day

## 2021-06-13 ENCOUNTER — Encounter: Payer: Self-pay | Admitting: Physician Assistant

## 2021-06-13 ENCOUNTER — Ambulatory Visit (INDEPENDENT_AMBULATORY_CARE_PROVIDER_SITE_OTHER): Payer: Medicaid Other | Admitting: Physician Assistant

## 2021-06-13 ENCOUNTER — Other Ambulatory Visit: Payer: Self-pay

## 2021-06-13 VITALS — BP 120/78 | HR 83 | Ht 65.0 in | Wt 261.5 lb

## 2021-06-13 DIAGNOSIS — I252 Old myocardial infarction: Secondary | ICD-10-CM

## 2021-06-13 DIAGNOSIS — I5189 Other ill-defined heart diseases: Secondary | ICD-10-CM

## 2021-06-13 DIAGNOSIS — I2510 Atherosclerotic heart disease of native coronary artery without angina pectoris: Secondary | ICD-10-CM

## 2021-06-13 DIAGNOSIS — K2270 Barrett's esophagus without dysplasia: Secondary | ICD-10-CM | POA: Diagnosis not present

## 2021-06-13 DIAGNOSIS — E785 Hyperlipidemia, unspecified: Secondary | ICD-10-CM

## 2021-06-13 DIAGNOSIS — Z720 Tobacco use: Secondary | ICD-10-CM

## 2021-06-13 DIAGNOSIS — I10 Essential (primary) hypertension: Secondary | ICD-10-CM | POA: Diagnosis not present

## 2021-06-13 NOTE — Progress Notes (Signed)
Office Visit    Patient Name: Karen Ruiz Date of Encounter: 06/13/2021  PCP:  Dan Humphreys, MD   [facility]  Cardiologist: Dr. Okey Dupre Advanced Practice Provider:  No care team member to display Electrophysiologist:  None [id number]   Chief Complaint    Chief Complaint  Patient presents with   Other    1 month f/u no complaints today. Meds reviewed verbally with pt.    40 y.o. 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  Past Medical History    Past Medical History:  Diagnosis Date   Barrett esophagus    Diabetes mellitus without complication (HCC)    Gastroparesis    Migraines    MRSA (methicillin resistant staph aureus) culture positive    2009   Obesity    Past Surgical History:  Procedure Laterality Date   CARDIAC CATHETERIZATION     CESAREAN SECTION     CORONARY STENT INTERVENTION N/A 05/02/2021   Procedure: CORONARY STENT INTERVENTION;  Surgeon: Yvonne Kendall, MD;  Location: [facility] INVASIVE CV LAB;  Service: Cardiovascular;  Laterality: N/A;   ESOPHAGOGASTRODUODENOSCOPY N/A 07/28/2019   Procedure: ESOPHAGOGASTRODUODENOSCOPY (EGD);  Surgeon: Toney Reil, MD;  Location: [facility] ENDOSCOPY;  Service: Gastroenterology;  Laterality: N/A;   INTRAVASCULAR ULTRASOUND/IVUS N/A 05/02/2021   Procedure: Intravascular Ultrasound/IVUS;  Surgeon: Yvonne Kendall, MD;  Location: [facility] INVASIVE CV LAB;  Service: Cardiovascular;  Laterality: N/A;   LEFT HEART CATH AND CORONARY ANGIOGRAPHY N/A 05/02/2021   Procedure: LEFT HEART CATH AND CORONARY ANGIOGRAPHY;  Surgeon: Yvonne Kendall, MD;  Location: [facility] INVASIVE CV LAB;  Service: Cardiovascular;  Laterality: N/A;     Allergies  Allergies  Allergen Reactions   Quinolones Other (See Comments)    CI with Ao dilation    History of Present Illness    Karen Ruiz is a 40 y.o. female with PMH as above.   Karen Ruiz is a 40 year old female with PMH as above.  She has no previous history of cardiac disease or previous cardiac work-up. She has family history of CAD.  She currently uses tobacco and marijuana.  She occasionally to rarely drinks alcohol.   On 05/02/21, she presented to the ED with central/ substernal chest pain that started on Friday, 04/28/2021 and has been coming and going since that time. CP was described as a burning that initially was 8/10 in severity and occurred without clear triggers. CP present both with exertion and at rest. No other associated symptoms reported, other than weakness and fatigue in the week leading up to the CP, and during which time she had a full body rash. No CP like this before in the past.   For 1 week leading up to this CP, she had a full body maculopapular rash of unclear etiology that broke out over her entire body with the exception of her face. She denies experiencing a rash like this before in the past. No tick bites. No exposure to new detergents or known allergens. No new medications. She has been taking benadryl for this rash, which resulted in fatigue.  Given her family history of heart disease, she presented to [facility] ED [facility]/02/2021.  Initial BP 147/105.  Labs showed sodium 130 (corrected Na 134), glucose 324,  calcium 8.8, hemoglobin 16.9, hematocrit 48.4. HS Tn 233.  Initial EKG sinus tachycardia at 127bpm, right axis deviation, nonspecific ST/T changes / repolarization changes.  Subsequent CTA was without evidence of pulmonary embolus and showed a tiny hiatal hernia. She was started on IV heparin and nitro with ASA and IV morphine administered. Cardiology consulted with CP 5/10 in severity.  05/02/21 LHC with severe two-vessel CAD, including diffuse  ostial through mid LAD plaquing up to 60%, followed by an occlusion of the apical LAD with left to left and right to left collaterals supplying the wraparound segment, as well as a long segment of disease in the proximal/mid RCA with focal ulceration and up to 80% stenosis.  Mild to moderate nonobstructive disease involving the left circumflex is noted.  Normal to hyperdynamic LV SF with apical inferior akinesis was noted with LVEDP normal.  PCI to the proximal/mid RCA using DES was performed with 0% residual stenosis and TIMI-3 flow.    She was kept overnight for observation and doing well in the morning without any chest pain or shortness of breath.  Brilinta card provided.  Recommendation was for DAPT with ASA and ticagrelor for at least 12 months.  Aggressive secondary prevention with high intensity statin therapy and glycemic control, as well as tobacco/marijuana cessation recommended.  Medical management of apical LAD occlusion was recommended, given this vessel was too small and distal for intervention.  Also recommended was medical therapy for the long segment of disease in the ostial through mid LAD.  Follow-up echo was recommended and performed.    Echo showed EF 60 to 65%, regional wall motion abnormalities, borderline dilated LVE, G1 DD, mild hypokinesis of the LV/apical inferior segment and apical segment, RV SF low normal, aortic dilation measuring 40 mm.  On 05/08/2021, she was seen for follow-up and doing well from a cardiac standpoint.  She reported improving body rash.  She had cut down smoking to 2 cigarettes since home and was working to completely stop smoking.   Today, 06/13/2021, she is seen today for 1 month follow-up and reports she is doing well from a cardiac standpoint.  She denies any chest pain or shortness of breath.  She reports complete resolution of her rash.  She is down to smoking 1 cigarette/day with coffee in the morning, and she reports she does not even need a cigarette  every day.  We discussed complete cessation of marijuana as well with patient report that she only occasionally uses marijuana.  She has been trying to increase activity by walking her kids to school, as they only live 1 mile from the school.  She picks them up/drops them off in the afternoon at least 3 times per week.  No chest pain or shortness of breath with this activity.  She does report she fatigues easier than usual, noticed when walking around the stairs.  However, she is hopeful that this will continue to improve with ongoing activity.  BP at home well controlled with SBP 1 10-1 20.  Every once in a while she notices dizziness, though not consistently.  No signs or symptoms of bleeding.  Medication replies reported.  She reports dependent edema in her legs, mainly when seated for long time, and which improves with leg elevation. No other signs or symptoms of volume overload.   She reports she is tolerating her medications well.  Home Medications   Current Outpatient Medications  Medication Instructions   aspirin 81 mg, Oral, Daily, Swallow whole.  atorvastatin (LIPITOR) 80 mg, Oral, Daily   escitalopram (LEXAPRO) 5 MG tablet Oral, Daily   insulin aspart (NOVOLOG) 0-9 Units, Subcutaneous, 3 times daily with meals   losartan (COZAAR) 25 mg, Oral, Daily   metFORMIN (GLUCOPHAGE-XR) 500 MG 24 hr tablet Oral, Daily   metoprolol succinate (TOPROL-XL) 100 mg, Oral, Daily, Take with or immediately following a meal.   pantoprazole (PROTONIX) 40 mg, Oral, Daily   ticagrelor (BRILINTA) 90 mg, Oral, 2 times daily     Review of Systems    She denies chest pain, palpitations, dyspnea, pnd, orthopnea, n, v, syncope, weight gain, or early satiety.  She reports occasional to rare dizziness, dependent edema when seated for a long time.  She reports fatigue with activity, though hopeful that this will improve.  She reports resolved full body rash.   All other systems reviewed and are otherwise negative  except as noted above.  Physical Exam    VS:  BP 120/78 (BP Location: Left Arm, Patient Position: Sitting, Cuff Size: Large)   Pulse 83   Ht 5\' 5"  (1.651 m)   Wt 261 lb 8 oz (118.6 kg)   SpO2 98%   BMI 43.52 kg/m  , BMI Body mass index is 43.52 kg/m. GEN: Well nourished, well developed, in no acute distress. HEENT: normal. Neck: Supple, no JVD, carotid bruits, or masses. Cardiac: RRR, no murmurs, rubs, or gallops. No clubbing, cyanosis, edema.  Radials/DP/PT 2+ and equal bilaterally.  Respiratory:  Respirations regular and unlabored, clear to auscultation bilaterally. GI: Soft, nontender, nondistended, BS + x 4. MS: no deformity or atrophy. Skin: warm and dry, no rash. Neuro:  Strength and sensation are intact. Psych: Normal affect.  Accessory Clinical Findings    ECG personally reviewed by me today -NSR, 83 bpm, low voltage QRS and poor R wave progression in inferior /precordial leads as seen in prior tracings- no acute changes.  VITALS Reviewed today   Temp Readings from Last 3 Encounters:  05/03/21 (!) 97.5 F (36.4 C) (Oral)  03/26/21 98 F (36.7 C)  07/29/19 98.5 F (36.9 C) (Oral)   BP Readings from Last 3 Encounters:  06/13/21 120/78  05/08/21 120/70  05/03/21 119/76   Pulse Readings from Last 3 Encounters:  06/13/21 83  05/08/21 80  05/03/21 71    Wt Readings from Last 3 Encounters:  06/13/21 261 lb 8 oz (118.6 kg)  05/08/21 272 lb (123.4 kg)  05/02/21 240 lb (108.9 kg)     LABS  reviewed today   Lab Results  Component Value Date   WBC 11.8 (H) 05/03/2021   HGB 14.4 05/03/2021   HCT 42.8 05/03/2021   MCV 85.6 05/03/2021   PLT 292 05/03/2021   Lab Results  Component Value Date   CREATININE 0.57 05/08/2021   BUN 10 05/08/2021   NA 136 05/08/2021   K 4.4 05/08/2021   CL 102 05/08/2021   CO2 21 05/08/2021   Lab Results  Component Value Date   ALT 35 05/02/2021   AST 22 05/02/2021   ALKPHOS 74 05/02/2021   BILITOT 0.8 05/02/2021   Lab  Results  Component Value Date   CHOL 215 (H) 05/02/2021   HDL 49 05/02/2021   LDLCALC 139 (H) 05/02/2021   TRIG 137 05/02/2021   CHOLHDL 4.4 05/02/2021    Lab Results  Component Value Date   HGBA1C 10.0 (H) 05/02/2021   Lab Results  Component Value Date   TSH 1.471 05/02/2021     STUDIES/PROCEDURES reviewed  today   Echo 05/02/21  1. Left ventricular ejection fraction, by estimation, is 60 to 65%. The  left ventricle has normal function. The left ventricle demonstrates  regional wall motion abnormalities (see scoring diagram/findings for  description). The left ventricular internal  cavity size was borderline dilated. Left ventricular diastolic parameters  are consistent with Grade I diastolic dysfunction (impaired relaxation).  There is mild hypokinesis of the left ventricular, apical inferior segment  and apical segment.   2. Right ventricular systolic function is low normal. The right  ventricular size is normal. Tricuspid regurgitation signal is inadequate  for assessing PA pressure.   3. The mitral valve is normal in structure. No evidence of mitral valve  regurgitation. No evidence of mitral stenosis.   4. The aortic valve was not well visualized. Aortic valve regurgitation  is not visualized. No aortic stenosis is present.   5. Aortic dilatation noted. There is mild dilatation of the aortic root,  measuring 40 mm.   LHC 05/02/21 Conclusions: Severe two-vessel coronary artery disease including diffuse ostial through mid LAD plaquing of up to 60% followed by occlusion of the apical LAD with left to left and right to left collaterals supplying the wraparound segment, as well as long segment of disease in the proximal/mid RCA with focal ulceration and up to 80% stenosis.  There is mild to moderate, nonobstructive disease involving the LCx. Normal to hyperdynamic left ventricular systolic function with apical inferior akinesis.  LVEDP normal. Successful IVUS guided PCI to the  proximal/mid RCA using Onyx Frontier 3.0 x 30 mm drug-eluting stent (postdilated to 3.6 mm) with 0% residual stenosis and TIMI-3 flow. Recommendations: Overnight observation. Dual antiplatelet therapy with aspirin and ticagrelor for at least 12 months. Aggressive secondary prevention including high intensity statin therapy, excellent glycemic control, and tobacco/marijuana cessation. Medical management of apical LAD occlusion, which is too small/distal for intervention.  Also recommend medical therapy for a long segment of disease in the ostial through mid LAD. Follow-up echocardiogram.     Intervention       Assessment & Plan    Severe 2v CAD s/p  PCI  to RCA History of NSTEMI --No current chest pain. S/p NSTEMI / LHC with PCI to RCA. As previously noted, plan for medical management of the apical LAD occlusion and long segment of disease in the ostial through mid LAD. --Continue DAPT with ASA and ticagrelor for at least 12 months. Continue PPI. Continue BB, ARB, statin.  --Continue aggressive secondary prevention, including intensive statin therapy, glycemic control, tobacco/marijuana cessation. Heart healthy diet with lifestyle changes reviewed.    Diastolic dysfunction --No shortness of breath.  Euvolemic on exam. Cath with nl LVEDP. Not currently on standing diuretic. Echo showed EF 60 to 65%, LV regional wall motion abnormalities, LV borderline dilated, G1 DD, mild hypokinesis of left ventricular, apical inferior segment, and apical segment, RV SF low normal.  Continue BB, ARB.  Euvolemic on exam with no indication to start a diuretic at this time.  Reviewed symptoms to monitor for with patient understanding.  Continue to reassess volume status at follow-up.   Mildly dilated aortic root --She is aware to avoid fluoroquinolones, heavy lifting. Continue to monitor with periodic echo / imaging.  HR, BP, LDL control recommended. Cessation of tobacco use.    Hypertension --BP well  controlled on current medications. Continue to monitor at home with goal BP 130/80 or less.     HLD, LDL goal below 70 --LDL 139 with goal LDL below  70. Total cholesterol 215. Continue current statin with recommendation for repeat lipid and liver function at RTC if not obtained before that time.  Previous liver function WNL.  If LDL still not at goal with repeat labs, addition of Zetia +/- PCSK9 inhibitors recommended at that time.   DM2 --A1C 10.0 on 8/2 labs. Recommend PCP/Endocrinology follow-up.  Continue current losartan given comorbid hypertension.   Tobacco use, marijuana use --Complete cessation of smoking of marijuana / tobacco recommended.  Reports she has only used marijuana couple of times and has cut back on tobacco use.  She is working towards complete cessation.  Medication changes: None Labs ordered: None Studies / Imaging ordered: None Future considerations: Repeat lipid and liver function yet performed?  Need to add Zetia/PCSK9i? Disposition: RTC 3 months  *Please be aware that the above documentation was completed voice recognition software and may contain dictation errors.     Lennon Alstrom, PA-C 06/13/2021

## 2021-06-13 NOTE — Patient Instructions (Signed)
Medication Instructions:   Your physician recommends that you continue on your current medications as directed. Please refer to the Current Medication list given to you today.  *If you need a refill on your cardiac medications before your next appointment, please call your pharmacy*   Lab Work:  None ordered  Testing/Procedures:  None ordered   Follow-Up: At [facility], you and your health needs are our priority.  As part of our continuing mission to provide you with exceptional heart care, we have created designated Provider Care Teams.  These Care Teams include your primary Cardiologist (physician) and Advanced Practice Providers (APPs -  Physician Assistants and Nurse Practitioners) who all work together to provide you with the care you need, when you need it.  We recommend signing up for the patient portal called "MyChart".  Sign up information is provided on this After Visit Summary.  MyChart is used to connect with patients for Virtual Visits (Telemedicine).  Patients are able to view lab/test results, encounter notes, upcoming appointments, etc.  Non-urgent messages can be sent to your provider as well.   To learn more about what you can do with MyChart, go to [email or web address].    Your next appointment:   3 month(s)  The format for your next appointment:   In Person  Provider:   You may see Dr. Cristal Deer End or one of the following Advanced Practice Providers on your designated Care Team:   Nicolasa Ducking, NP Eula Listen, PA-C Marisue Ivan, PA-C Cadence Dent, [place]

## 2021-06-29 ENCOUNTER — Emergency Department: Payer: Medicaid Other

## 2021-06-29 ENCOUNTER — Other Ambulatory Visit: Payer: Self-pay

## 2021-06-29 ENCOUNTER — Encounter: Payer: Self-pay | Admitting: Emergency Medicine

## 2021-06-29 ENCOUNTER — Emergency Department
Admission: EM | Admit: 2021-06-29 | Discharge: 2021-06-29 | Disposition: A | Discharge status: Home / Self Care | Payer: Medicaid Other | Attending: Emergency Medicine | Admitting: Emergency Medicine

## 2021-06-29 DIAGNOSIS — Z79899 Other long term (current) drug therapy: Secondary | ICD-10-CM | POA: Diagnosis not present

## 2021-06-29 DIAGNOSIS — E119 Type 2 diabetes mellitus without complications: Secondary | ICD-10-CM | POA: Insufficient documentation

## 2021-06-29 DIAGNOSIS — Z794 Long term (current) use of insulin: Secondary | ICD-10-CM | POA: Insufficient documentation

## 2021-06-29 DIAGNOSIS — Z7984 Long term (current) use of oral hypoglycemic drugs: Secondary | ICD-10-CM | POA: Insufficient documentation

## 2021-06-29 DIAGNOSIS — F17210 Nicotine dependence, cigarettes, uncomplicated: Secondary | ICD-10-CM | POA: Insufficient documentation

## 2021-06-29 DIAGNOSIS — Z7982 Long term (current) use of aspirin: Secondary | ICD-10-CM | POA: Diagnosis not present

## 2021-06-29 DIAGNOSIS — K3184 Gastroparesis: Secondary | ICD-10-CM | POA: Insufficient documentation

## 2021-06-29 DIAGNOSIS — R339 Retention of urine, unspecified: Secondary | ICD-10-CM | POA: Insufficient documentation

## 2021-06-29 DIAGNOSIS — I2510 Atherosclerotic heart disease of native coronary artery without angina pectoris: Secondary | ICD-10-CM | POA: Diagnosis not present

## 2021-06-29 DIAGNOSIS — I10 Essential (primary) hypertension: Secondary | ICD-10-CM | POA: Insufficient documentation

## 2021-06-29 DIAGNOSIS — R1084 Generalized abdominal pain: Secondary | ICD-10-CM | POA: Diagnosis present

## 2021-06-29 LAB — CBC
HCT: 43.8 % (ref 36.0–46.0)
Hemoglobin: 14.3 g/dL (ref 12.0–15.0)
MCH: 27.7 pg (ref 26.0–34.0)
MCHC: 32.6 g/dL (ref 30.0–36.0)
MCV: 84.7 fL (ref 80.0–100.0)
Platelets: 346 10*3/uL (ref 150–400)
RBC: 5.17 MIL/uL — ABNORMAL HIGH (ref 3.87–5.11)
RDW: 13.9 % (ref 11.5–15.5)
WBC: 16 10*3/uL — ABNORMAL HIGH (ref 4.0–10.5)
nRBC: 0 % (ref 0.0–0.2)

## 2021-06-29 LAB — HEPATIC FUNCTION PANEL
ALT: 23 U/L (ref 0–44)
AST: 26 U/L (ref 15–41)
Albumin: 3.9 g/dL (ref 3.5–5.0)
Alkaline Phosphatase: 80 U/L (ref 38–126)
Bilirubin, Direct: 0.1 mg/dL (ref 0.0–0.2)
Indirect Bilirubin: 0.6 mg/dL (ref 0.3–0.9)
Total Bilirubin: 0.7 mg/dL (ref 0.3–1.2)
Total Protein: 7.5 g/dL (ref 6.5–8.1)

## 2021-06-29 LAB — BASIC METABOLIC PANEL
Anion gap: 13 (ref 5–15)
BUN: 12 mg/dL (ref 6–20)
CO2: 21 mmol/L — ABNORMAL LOW (ref 22–32)
Calcium: 8.7 mg/dL — ABNORMAL LOW (ref 8.9–10.3)
Chloride: 100 mmol/L (ref 98–111)
Creatinine, Ser: 0.72 mg/dL (ref 0.44–1.00)
GFR, Estimated: >60 mL/min (ref >60)
Glucose, Bld: 475 mg/dL — ABNORMAL HIGH (ref 70–99)
Potassium: 4 mmol/L (ref 3.5–5.1)
Sodium: 134 mmol/L — ABNORMAL LOW (ref 135–145)

## 2021-06-29 LAB — LIPASE, BLOOD: Lipase: 25 U/L (ref 11–51)

## 2021-06-29 LAB — TROPONIN I (HIGH SENSITIVITY)
Troponin I (High Sensitivity): 7 ng/L (ref <18)
Troponin I (High Sensitivity): 6 ng/L (ref <18)

## 2021-06-29 MED ORDER — METOCLOPRAMIDE HCL 10 MG PO TABS: 10.0000 mg | ORAL_TABLET | Freq: Three times a day (TID) | ORAL | 0 refills | Status: DC | PRN

## 2021-06-29 MED ORDER — LACTATED RINGERS IV BOLUS
1000.0000 mL | Freq: Once | INTRAVENOUS | Status: AC
  Administered 2021-06-29: 1000 mL via INTRAVENOUS

## 2021-06-29 MED ORDER — DIPHENHYDRAMINE HCL 50 MG/ML IJ SOLN
25.0000 mg | Freq: Once | INTRAMUSCULAR | Status: AC
  Administered 2021-06-29: 25 mg via INTRAVENOUS
  Filled 2021-06-29: qty 1

## 2021-06-29 MED ORDER — PROMETHAZINE HCL 25 MG RE SUPP: 25.0000 mg | Freq: Three times a day (TID) | RECTAL | 0 refills | Status: DC | PRN

## 2021-06-29 MED ORDER — HALOPERIDOL LACTATE 5 MG/ML IJ SOLN
5.0000 mg | Freq: Once | INTRAMUSCULAR | Status: AC
  Administered 2021-06-29: 5 mg via INTRAVENOUS
  Filled 2021-06-29: qty 1

## 2021-06-29 NOTE — ED Triage Notes (Signed)
Pt in via POV, reports generalized abdominal pain which began last night, developing into chest pain today.  Reports some intermittent N/V.  Patient moaning, restless in triage.    Significant other reports pain was to this severity when she had most recent MI.

## 2021-06-29 NOTE — ED Notes (Signed)
Pt transported to XR.  

## 2021-06-29 NOTE — Discharge Instructions (Addendum)
I'd recommend discussing your symptoms with your physician for referral to a GI specialist  For now, start the Reglan twice a day for the next 1-2 days  Then take this as needed  For severe nausea/vomiting not improved by this, you can use the Phenergan suppository

## 2021-06-29 NOTE — ED Notes (Addendum)
EDP Isaacs at bedside 

## 2021-06-29 NOTE — ED Provider Notes (Signed)
[facility] Emergency Department Provider Note  ____________________________________________   Event Date/Time   First MD Initiated Contact with Patient 06/29/21 1109     (approximate)  I have reviewed the triage vital signs and the nursing notes.   HISTORY  Chief Complaint Abdominal Pain and Chest Pain    HPI Karen Ruiz is a 40 y.o. female   40 year old female here with recurrent urinary retention.  With past medical history of migraines, gastroparesis, recent NSTEMI, here with abdominal pain, nausea, vomiting.  History is somewhat limited due to patient actively retching and yelling intermittently during exam.  Per report, she developed acute onset of generalized abdominal and upper abdominal/chest pain yesterday.  It is persisted since onset.  She said associated nausea and vomiting.  Feels similar to her previous gastroparesis flares.  She has no recent triggers.  She does have some burning chest discomfort up from her abdomen, which does feel somewhat similar to her NSTEMI although she has also had esophagitis.  Denies recent medication changes.  No fevers or chills.  No urinary symptoms.  Symptoms are worse with any attempted eating.  No alleviating factors.       Past Medical History:  Diagnosis Date   Barrett esophagus    Diabetes mellitus without complication (HCC)    Gastroparesis    Migraines    MRSA (methicillin resistant staph aureus) culture positive    2009   Obesity     Patient Active Problem List   Diagnosis Date Noted   Type 2 diabetes mellitus with hyperlipidemia (HCC)    Coronary artery disease involving native coronary artery of native heart without angina pectoris    Non-ST elevated myocardial infarction (HCC) 05/02/2021   NSTEMI (non-ST elevated myocardial infarction) (HCC) 05/02/2021   Acute gastritis 07/27/2019   Intractable nausea and vomiting 07/15/2019   Dependent edema 03/05/2019   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/05/2019   Tobacco abuse 09/06/2016   Diabetes type 2, uncontrolled (HCC) 09/06/2016   Barrett esophagus 03/28/2012   Hypertension 01/10/2012   Eustachian tube dysfunction 07/06/2003    Past Surgical History:  Procedure Laterality Date   CARDIAC CATHETERIZATION     CESAREAN SECTION     CORONARY STENT INTERVENTION N/A 05/02/2021   Procedure: CORONARY STENT INTERVENTION;  Surgeon: Yvonne Kendall, MD;  Location: [facility] INVASIVE CV LAB;  Service: Cardiovascular;  Laterality: N/A;   ESOPHAGOGASTRODUODENOSCOPY N/A 07/28/2019   Procedure: ESOPHAGOGASTRODUODENOSCOPY (EGD);  Surgeon: Toney Reil, MD;  Location: [facility] ENDOSCOPY;  Service: Gastroenterology;  Laterality: N/A;   INTRAVASCULAR ULTRASOUND/IVUS N/A 05/02/2021   Procedure: Intravascular Ultrasound/IVUS;  Surgeon: Yvonne Kendall, MD;  Location: [facility] INVASIVE CV LAB;  Service: Cardiovascular;  Laterality: N/A;   LEFT HEART CATH AND CORONARY ANGIOGRAPHY N/A 05/02/2021   Procedure: LEFT HEART CATH AND CORONARY ANGIOGRAPHY;  Surgeon: Yvonne Kendall, MD;  Location: [facility] INVASIVE CV LAB;  Service: Cardiovascular;  Laterality: N/A;    Prior to Admission medications   Medication Sig Start Date End Date Taking? Authorizing Provider  aspirin EC 81 MG EC tablet Take 1 tablet (81 mg total) by mouth daily. Swallow whole. 05/04/21  Yes Delfino Lovett, MD  atorvastatin (LIPITOR) 80 MG tablet Take 1 tablet (80 mg total) by mouth daily. 05/31/21 08/29/21 Yes Visser, Jacquelyn D, PA-C  escitalopram (LEXAPRO) 5 MG tablet Take by mouth daily. 06/12/21 09/10/21 Yes [provider]  insulin aspart (NOVOLOG) 100 UNIT/ML injection Inject 0-9 Units into the skin 3 (three) times daily with meals. 07/16/19  Yes  Katha Hamming, MD  losartan (COZAAR) 25 MG tablet Take 1 tablet (25 mg total) by mouth daily. 05/31/21 08/29/21 Yes Visser, Jacquelyn D, PA-C  metFORMIN (GLUCOPHAGE-XR) 500 MG 24 hr tablet Take by mouth daily. 06/12/21 06/12/22  Yes [provider]  metoCLOPramide (REGLAN) 10 MG tablet Take 1 tablet (10 mg total) by mouth every 8 (eight) hours as needed for nausea or vomiting. Take 1 tablet twice daily for 1 day, then 1 tablet (10 mg total) every 8 hours as needed for nausea or vomiting. 06/29/21 06/29/22 Yes Shaune Pollack, MD  metoprolol succinate (TOPROL-XL) 100 MG 24 hr tablet Take 1 tablet (100 mg total) by mouth daily. Take with or immediately following a meal. 05/31/21 08/29/21 Yes Visser, Jacquelyn D, PA-C  pantoprazole (PROTONIX) 40 MG tablet Take 1 tablet (40 mg total) by mouth daily. 05/31/21 08/29/21 Yes Visser, Jacquelyn D, PA-C  promethazine (PHENERGAN) 25 MG suppository Place 1 suppository (25 mg total) rectally every 8 (eight) hours as needed for refractory nausea / vomiting. 06/29/21  Yes Shaune Pollack, MD  ticagrelor (BRILINTA) 90 MG TABS tablet Take 1 tablet (90 mg total) by mouth 2 (two) times daily. 05/31/21 08/29/21 Yes Marisue Ivan D, PA-C    Allergies Quinolones  Family History  Problem Relation Age of Onset   Diabetes Mother    Cancer Mother    Heart failure Mother    Hypertension Mother    Heart attack Father    Diabetes Father    Hypertension Father     Social History Social History   Tobacco Use   Smoking status: Some Days    Packs/day: 0.50    Types: Cigarettes   Smokeless tobacco: Never  Vaping Use   Vaping Use: Never used  Substance Use Topics   Alcohol use: Yes   Drug use: Yes    Types: Marijuana    Review of Systems  Review of Systems  Constitutional:  Positive for fatigue.  Gastrointestinal:  Positive for abdominal pain, nausea and vomiting.    ____________________________________________  PHYSICAL EXAM:      VITAL SIGNS: ED Triage Vitals  Enc Vitals Group     BP 06/29/21 1059 (!) 174/111     Pulse Rate 06/29/21 1059 85     Resp 06/29/21 1059 20     Temp 06/29/21 1059 98.6 F (37 C)     Temp Source 06/29/21 1059 Oral     SpO2 06/29/21 1059  97 %     Weight 06/29/21 1045 240 lb (108.9 kg)     Height 06/29/21 1045 5\' 6"  (1.676 m)     Head Circumference --      Peak Flow --      Pain Score 06/29/21 1044 10     Pain Loc --      Pain Edu? --      Excl. in GC? --      Physical Exam Vitals and nursing note reviewed.  Constitutional:      General: She is not in acute distress.    Appearance: She is well-developed.  HENT:     Head: Normocephalic and atraumatic.     Mouth/Throat:     Comments: Dry MM Eyes:     Conjunctiva/sclera: Conjunctivae normal.  Cardiovascular:     Rate and Rhythm: Normal rate and regular rhythm.     Heart sounds: Normal heart sounds. No murmur heard.   No friction rub.  Pulmonary:     Effort: Pulmonary effort is normal. No respiratory distress.  Breath sounds: Normal breath sounds. No wheezing or rales.  Abdominal:     General: There is no distension.     Palpations: Abdomen is soft.     Tenderness: There is no abdominal tenderness.  Musculoskeletal:     Cervical back: Neck supple.  Skin:    General: Skin is warm.     Capillary Refill: Capillary refill takes less than 2 seconds.  Neurological:     Mental Status: She is alert and oriented to person, place, and time.     Motor: No abnormal muscle tone.      ____________________________________________   LABS (all labs ordered are listed, but only abnormal results are displayed)  Labs Reviewed  BASIC METABOLIC PANEL - Abnormal; Notable for the following components:      Result Value   Sodium 134 (*)    CO2 21 (*)    Glucose, Bld 475 (*)    Calcium 8.7 (*)    All other components within normal limits  CBC - Abnormal; Notable for the following components:   WBC 16.0 (*)    RBC 5.17 (*)    All other components within normal limits  HEPATIC FUNCTION PANEL  LIPASE, BLOOD  POC URINE PREG, ED  TROPONIN I (HIGH SENSITIVITY)  TROPONIN I (HIGH SENSITIVITY)    ____________________________________________  EKG: Normal sinus  rhythm, ventricular rate 92.  PR 120, QRS 86, QTc 460.  No acute ST elevations or depressions.  No acute events of acute ischemia or infarct. ________________________________________  RADIOLOGY All imaging, including plain films, CT scans, and ultrasounds, independently reviewed by me, and interpretations confirmed via formal radiology reads.  ED MD interpretation:   CXR Negative  Official radiology report(s): DG Chest 2 View  Result Date: 06/29/2021 CLINICAL DATA:  Chest pain EXAM: CHEST - 2 VIEW COMPARISON:  05/02/2021 FINDINGS: The heart size and mediastinal contours are within normal limits. Low lung volumes. No focal airspace consolidation, pleural effusion, or pneumothorax. The visualized skeletal structures are unremarkable. IMPRESSION: No active cardiopulmonary disease. Electronically Signed   By: Duanne Guess D.O.   On: 06/29/2021 12:43    ____________________________________________  PROCEDURES   Procedure(s) performed (including Critical Care):  Procedures  ____________________________________________  INITIAL IMPRESSION / MDM / ASSESSMENT AND PLAN / ED COURSE  As part of my medical decision making, I reviewed the following data within the electronic MEDICAL RECORD NUMBER Nursing notes reviewed and incorporated, Old chart reviewed, Notes from prior ED visits, and  Controlled Substance Database       *KAMEKO HUKILL was evaluated in Emergency Department on 06/29/2021 for the symptoms described in the history of present illness. She was evaluated in the context of the global COVID-19 pandemic, which necessitated consideration that the patient might be at risk for infection with the SARS-CoV-2 virus that causes COVID-19. Institutional protocols and algorithms that pertain to the evaluation of patients at risk for COVID-19 are in a state of rapid change based on information released by regulatory bodies including the CDC and federal and state organizations. These policies  and algorithms were followed during the patient's care in the ED.  Some ED evaluations and interventions may be delayed as a result of limited staffing during the pandemic.*     Medical Decision Making:  40 yo F with h/o gastroparesis, NSTEMI, here with n/v and abd pain. Clinically, suspect gastroparesis vs cyclical vomiting. Less likely NSTEMI with normal EKG, history more c/w GI etiology. Pt given fluids, haldol with excellent improvement. Labs very reassuring.  Trop neg x 2, doubt ACS. LFTs, lipase wnl. BMP with mild hyperglycemia likely stress-related, IVF given. WBC 16 likely reactive. Abdomen is soft, NT, ND, after meds. Tolerating PO. Given well documented h/o similar presentations with resolution of symptoms and reassuring exam, do not feel additional imaging is indicated.  ____________________________________________  FINAL CLINICAL IMPRESSION(S) / ED DIAGNOSES  Final diagnoses:  Gastroparesis     MEDICATIONS GIVEN DURING THIS VISIT:  Medications  haloperidol lactate (HALDOL) injection 5 mg (5 mg Intravenous Given 06/29/21 1118)  diphenhydrAMINE (BENADRYL) injection 25 mg (25 mg Intravenous Given 06/29/21 1118)  lactated ringers bolus 1,000 mL (1,000 mLs Intravenous New Bag/Given 06/29/21 1117)     ED Discharge Orders          Ordered    metoCLOPramide (REGLAN) 10 MG tablet  Every 8 hours PRN        06/29/21 1309    promethazine (PHENERGAN) 25 MG suppository  Every 8 hours PRN        06/29/21 1309             Note:  This document was prepared using Dragon voice recognition software and may include unintentional dictation errors.   Shaune Pollack, MD 06/29/21 1349

## 2021-06-29 NOTE — ED Triage Notes (Signed)
First Nurse Note:  Arrives with C/O stomach pains last night and chest pains today.  Patient anxious.  Rocking in wheelchair.  Reassurance given with no effect.

## 2021-06-29 NOTE — ED Notes (Signed)
This RN at bedside at this time to obtain IV. Pt is pacing around the room. Yelling and hysterically moaning at this time. Pt will not sit still to be attached to cardiac monitor at this time. Pt will only answer some questions. Pt c/o CP and abd pain since last night that gradually has gotten worse. Pt states that she has a hx of MI recently. Pt is nauseous and vomiting.

## 2021-09-14 ENCOUNTER — Ambulatory Visit: Payer: Medicaid Other | Admitting: Internal Medicine

## 2021-09-14 NOTE — Progress Notes (Deleted)
Follow-up Outpatient Visit Date: 09/14/2021  Primary Care Provider: Dan Humphreys, MD [address] [place] [address]  Chief Complaint: ***  HPI:  Karen Ruiz is a 40 y.o. female with history of coronary artery disease with NSTEMI status post PCI to RCA (05/2021), hypertension, hyperlipidemia, and type 2 diabetes mellitus, who presents for follow-up of coronary artery disease.  She was last seen in our office by Marisue Ivan, PA, in mid September, at which time she was doing well without chest pain or shortness of breath.  She had cut down on smoking and marijuana use.  No medication changes or additional testing were pursued; continued medical therapy and risk factor modification was recommended for management of diffuse moderate LAD disease and chronic total occlusion of the apical LAD.  --------------------------------------------------------------------------------------------------  Past Medical History:  Diagnosis Date   Barrett esophagus    Diabetes mellitus without complication (HCC)    Gastroparesis    Migraines    MRSA (methicillin resistant staph aureus) culture positive    2009   Obesity    Past Surgical History:  Procedure Laterality Date   CARDIAC CATHETERIZATION     CESAREAN SECTION     CORONARY STENT INTERVENTION N/A 05/02/2021   Procedure: CORONARY STENT INTERVENTION;  Surgeon: Yvonne Kendall, MD;  Location: [facility] INVASIVE CV LAB;  Service: Cardiovascular;  Laterality: N/A;   ESOPHAGOGASTRODUODENOSCOPY N/A 07/28/2019   Procedure: ESOPHAGOGASTRODUODENOSCOPY (EGD);  Surgeon: Toney Reil, MD;  Location: [facility] ENDOSCOPY;  Service: Gastroenterology;  Laterality: N/A;   INTRAVASCULAR ULTRASOUND/IVUS N/A 05/02/2021   Procedure: Intravascular Ultrasound/IVUS;  Surgeon: Yvonne Kendall, MD;  Location: [facility] INVASIVE CV LAB;  Service: Cardiovascular;  Laterality: N/A;   LEFT HEART CATH AND CORONARY ANGIOGRAPHY N/A 05/02/2021   Procedure: LEFT HEART  CATH AND CORONARY ANGIOGRAPHY;  Surgeon: Yvonne Kendall, MD;  Location: [facility] INVASIVE CV LAB;  Service: Cardiovascular;  Laterality: N/A;     No outpatient medications have been marked as taking for the 09/14/21 encounter (Appointment) with Maclin Guerrette, Cristal Deer, MD.    Allergies: Quinolones  Social History   Tobacco Use   Smoking status: Some Days    Packs/day: 0.50    Types: Cigarettes   Smokeless tobacco: Never  Vaping Use   Vaping Use: Never used  Substance Use Topics   Alcohol use: Yes   Drug use: Yes    Types: Marijuana    Family History  Problem Relation Age of Onset   Diabetes Mother    Cancer Mother    Heart failure Mother    Hypertension Mother    Heart attack Father    Diabetes Father    Hypertension Father     Review of Systems: A 12-system review of systems was performed and was negative except as noted in the HPI.  --------------------------------------------------------------------------------------------------  Physical Exam: There were no vitals taken for this visit.  General:  NAD. Neck: No JVD or HJR. Lungs: Clear to auscultation bilaterally without wheezes or crackles. Heart: Regular rate and rhythm without murmurs, rubs, or gallops. Abdomen: Soft, nontender, nondistended. Extremities: No lower extremity edema.  EKG:  ***  Lab Results  Component Value Date   WBC 16.0 (H) 06/29/2021   HGB 14.3 06/29/2021   HCT 43.8 06/29/2021   MCV 84.7 06/29/2021   PLT 346 06/29/2021    Lab Results  Component Value Date   NA 134 (L) 06/29/2021   K 4.0 06/29/2021   CL 100 06/29/2021   CO2 21 (L) 06/29/2021   BUN 12 06/29/2021  CREATININE 0.72 06/29/2021   GLUCOSE 475 (H) 06/29/2021   ALT 23 06/29/2021    Lab Results  Component Value Date   CHOL 215 (H) 05/02/2021   HDL 49 05/02/2021   LDLCALC 139 (H) 05/02/2021   TRIG 137 05/02/2021   CHOLHDL 4.4 05/02/2021     --------------------------------------------------------------------------------------------------  ASSESSMENT AND PLAN: Yvonne Kendall, MD 09/14/2021 6:43 AM

## 2021-09-15 ENCOUNTER — Encounter: Payer: Self-pay | Admitting: Internal Medicine

## 2021-11-20 ENCOUNTER — Other Ambulatory Visit: Payer: Self-pay

## 2021-11-20 MED ORDER — PANTOPRAZOLE SODIUM 40 MG PO TBEC: 40.0000 mg | DELAYED_RELEASE_TABLET | Freq: Every day | ORAL | 0 refills | Status: DC

## 2021-11-20 MED ORDER — METOPROLOL SUCCINATE ER 100 MG PO TB24: 100.0000 mg | ORAL_TABLET | Freq: Every day | ORAL | 0 refills | Status: DC

## 2021-11-21 ENCOUNTER — Ambulatory Visit (INDEPENDENT_AMBULATORY_CARE_PROVIDER_SITE_OTHER): Payer: Medicaid Other | Admitting: Medical

## 2021-11-21 ENCOUNTER — Emergency Department
Admission: EM | Admit: 2021-11-21 | Discharge: 2021-11-21 | Disposition: A | Discharge status: Home / Self Care | Payer: Medicaid Other | Attending: Emergency Medicine | Admitting: Emergency Medicine

## 2021-11-21 ENCOUNTER — Emergency Department: Payer: Medicaid Other

## 2021-11-21 ENCOUNTER — Other Ambulatory Visit: Payer: Self-pay

## 2021-11-21 VITALS — BP 188/104 | HR 95

## 2021-11-21 DIAGNOSIS — E119 Type 2 diabetes mellitus without complications: Secondary | ICD-10-CM | POA: Insufficient documentation

## 2021-11-21 DIAGNOSIS — R0789 Other chest pain: Secondary | ICD-10-CM

## 2021-11-21 DIAGNOSIS — D72829 Elevated white blood cell count, unspecified: Secondary | ICD-10-CM | POA: Diagnosis not present

## 2021-11-21 DIAGNOSIS — K3184 Gastroparesis: Secondary | ICD-10-CM

## 2021-11-21 DIAGNOSIS — I119 Hypertensive heart disease without heart failure: Secondary | ICD-10-CM | POA: Insufficient documentation

## 2021-11-21 DIAGNOSIS — I2510 Atherosclerotic heart disease of native coronary artery without angina pectoris: Secondary | ICD-10-CM | POA: Insufficient documentation

## 2021-11-21 DIAGNOSIS — R079 Chest pain, unspecified: Secondary | ICD-10-CM

## 2021-11-21 LAB — CBC
HCT: 42.3 % (ref 36.0–46.0)
Hemoglobin: 13.8 g/dL (ref 12.0–15.0)
MCH: 25.4 pg — ABNORMAL LOW (ref 26.0–34.0)
MCHC: 32.6 g/dL (ref 30.0–36.0)
MCV: 77.9 fL — ABNORMAL LOW (ref 80.0–100.0)
Platelets: 400 10*3/uL (ref 150–400)
RBC: 5.43 MIL/uL — ABNORMAL HIGH (ref 3.87–5.11)
RDW: 15.5 % (ref 11.5–15.5)
WBC: 13.1 10*3/uL — ABNORMAL HIGH (ref 4.0–10.5)
nRBC: 0 % (ref 0.0–0.2)

## 2021-11-21 LAB — BASIC METABOLIC PANEL
Anion gap: 14 (ref 5–15)
BUN: 14 mg/dL (ref 6–20)
CO2: 21 mmol/L — ABNORMAL LOW (ref 22–32)
Calcium: 9.4 mg/dL (ref 8.9–10.3)
Chloride: 103 mmol/L (ref 98–111)
Creatinine, Ser: 0.55 mg/dL (ref 0.44–1.00)
GFR, Estimated: >60 mL/min (ref >60)
Glucose, Bld: 339 mg/dL — ABNORMAL HIGH (ref 70–99)
Potassium: 3.8 mmol/L (ref 3.5–5.1)
Sodium: 138 mmol/L (ref 135–145)

## 2021-11-21 LAB — HEPATIC FUNCTION PANEL
ALT: 35 U/L (ref 0–44)
AST: 20 U/L (ref 15–41)
Albumin: 4 g/dL (ref 3.5–5.0)
Alkaline Phosphatase: 79 U/L (ref 38–126)
Bilirubin, Direct: 0.1 mg/dL (ref 0.0–0.2)
Indirect Bilirubin: 0.6 mg/dL (ref 0.3–0.9)
Total Bilirubin: 0.7 mg/dL (ref 0.3–1.2)
Total Protein: 7.5 g/dL (ref 6.5–8.1)

## 2021-11-21 LAB — URINALYSIS, COMPLETE (UACMP) WITH MICROSCOPIC
Bacteria, UA: NONE SEEN
Bilirubin Urine: NEGATIVE
Glucose, UA: >=500 mg/dL — AB
Hgb urine dipstick: NEGATIVE
Ketones, ur: 80 mg/dL — AB
Leukocytes,Ua: NEGATIVE
Nitrite: NEGATIVE
Protein, ur: NEGATIVE mg/dL
Specific Gravity, Urine: 1.036 — ABNORMAL HIGH (ref 1.005–1.030)
pH: 8 (ref 5.0–8.0)

## 2021-11-21 LAB — POC URINE PREG, ED: Preg Test, Ur: NEGATIVE

## 2021-11-21 LAB — LIPASE, BLOOD: Lipase: 26 U/L (ref 11–51)

## 2021-11-21 LAB — TROPONIN I (HIGH SENSITIVITY)
Troponin I (High Sensitivity): 5 ng/L (ref <18)
Troponin I (High Sensitivity): 9 ng/L (ref <18)

## 2021-11-21 MED ORDER — OXYCODONE-ACETAMINOPHEN 5-325 MG PO TABS
1.0000 | ORAL_TABLET | Freq: Once | ORAL | Status: AC
  Administered 2021-11-21: 1 via ORAL
  Filled 2021-11-21: qty 1

## 2021-11-21 MED ORDER — FAMOTIDINE IN NACL 20-0.9 MG/50ML-% IV SOLN
20.0000 mg | Freq: Once | INTRAVENOUS | Status: AC
  Administered 2021-11-21: 20 mg via INTRAVENOUS
  Filled 2021-11-21: qty 50

## 2021-11-21 MED ORDER — LACTATED RINGERS IV BOLUS
1000.0000 mL | Freq: Once | INTRAVENOUS | Status: AC
  Administered 2021-11-21: 1000 mL via INTRAVENOUS

## 2021-11-21 MED ORDER — DROPERIDOL 2.5 MG/ML IJ SOLN
2.5000 mg | Freq: Once | INTRAMUSCULAR | Status: AC
  Administered 2021-11-21: 2.5 mg via INTRAVENOUS
  Filled 2021-11-21: qty 2

## 2021-11-21 MED ORDER — NITROGLYCERIN 0.4 MG SL SUBL
0.4000 mg | SUBLINGUAL_TABLET | SUBLINGUAL | Status: AC | PRN
Start: 2021-11-21 — End: ?
  Administered 2021-11-21: 0.4 mg via SUBLINGUAL

## 2021-11-21 MED ORDER — METOCLOPRAMIDE HCL 10 MG PO TABS: 10.0000 mg | ORAL_TABLET | Freq: Three times a day (TID) | ORAL | 0 refills | Status: DC | PRN

## 2021-11-21 MED ORDER — METOCLOPRAMIDE HCL 5 MG/ML IJ SOLN
10.0000 mg | Freq: Once | INTRAMUSCULAR | Status: AC
  Administered 2021-11-21: 10 mg via INTRAVENOUS
  Filled 2021-11-21: qty 2

## 2021-11-21 NOTE — ED Provider Triage Note (Signed)
Emergency Medicine Provider Triage Evaluation Note  LETHER DAMRON , a 41 y.o. female  was evaluated in triage.  Pt complains of chest pain and abdominal pain.  History of MI.  Patient's had vomiting and diarrhea.  No known fever..  Review of Systems  Positive: Chest pain/abdominal pain Negative: Dysuria  Physical Exam  BP (!) 193/122    Pulse 91    Temp 98 F (36.7 C)    Resp 19    SpO2 99%  Gen:   Awake, no distress   Resp:  Normal effort  MSK:   Moves extremities without difficulty  Other:    Medical Decision Making  Medically screening exam initiated at 1:12 PM.  Appropriate orders placed.  Jaylanis Roccaforte Edling was informed that the remainder of the evaluation will be completed by another provider, this initial triage assessment does not replace that evaluation, and the importance of remaining in the ED until their evaluation is complete.  Patient crying in pain.  Ordered Percocet 1 p.o. for patient.  Due to the abdominal pain I did add a lipase and hepatic panel.  Informed family that she has 50 or 50 other people in front of her for room and that were very busy.  Please be patient we are watching her labs.   Versie Starks, PA-C 11/21/21 1313

## 2021-11-21 NOTE — ED Notes (Signed)
Spouse left to pick up kids but had said would return. Trying to call now to have him come for pt who is being discharged. LM.

## 2021-11-21 NOTE — Progress Notes (Signed)
°  Chaplain On-Call responded to Code [facility] notification for the [facility] in [facility].  Learned from [facility]olph Pollack Staff that the patient was taken to the Emergency Department.  Chaplain arrived in the ED and learned that the patient was in ED Triage Room 1.  Patient was not available for Chaplain to visit, and no family is present.  Chaplain On-Call can be contacted if additional support is requested.  Chaplain Evelena Peat M.Div., [facility]

## 2021-11-21 NOTE — Progress Notes (Signed)
Patient arrive for scheduled cardiology follow-up, accompanied by her partner. Upon entering the room patient was noted to have some possible confusion complaining of severe chest pain and nausea. She was visibly uncomfortable. It was difficult to obtain vital signs or EKG. I was called in the room and she was visibly distraught . Reported chest pain that started last night and again recurrent this morning, described it as a burning pain. Also felt severely nauseous, wanting to vomit. Somewhat flailing of her body and arms, almost falling out of her seat at times. EKG showed SR 76bpm with no acute ST /T wave changes. BP 188/104, 95 pulse. BG 345. Partner reports she did not eat this AM due to GI issues. Dr. Rockey Situ came in the room and Rapid response was called. She was given 1SL NTG and taken to the ER for further work-up.   Patient has a history of severe 2 V CAD s/p PCI to the RCA 05/2021 with residual LAD disease, mild dilation of the aortic root, G1DD, HLD, HTN, diabetes, Barrett's esophagus, tobacco/marijuana use. Further work-up per ER.  Vinny Taranto Kathlen Mody, PA-C

## 2021-11-21 NOTE — Progress Notes (Signed)
Patient was at [facility] being seen by Talbert Forest PA. She documented patient was potentially confused, c/o Chest pain and nausea and distraught. Rapid response was called and patient was transported to ED triage. Code blue was called. No loss of pulse or airway reported.

## 2021-11-21 NOTE — ED Triage Notes (Addendum)
Pt comes with c/o CP, vomiting and belly pain that started last night. Family reports nausea and CP from all throwing up.  Pt screaming in pain and moving around in chair. Pt unable to sit still.

## 2021-11-21 NOTE — ED Provider Notes (Signed)
[facility] Provider Note    Event Date/Time   First MD Initiated Contact with Patient 11/21/21 1357     (approximate)   History   Chest Pain   HPI  Karen Ruiz is a 41 y.o. female who presents to the ED for evaluation of Chest Pain   History of CAD s/p PCI to the RCA in August 2022.  Obesity, Barrett's esophagus, HTN, HLD and DM.  Gastroparesis.  Previous diagnoses of cannabis hyperemesis syndrome. I reviewed note from cardiology PA from this morning, patient was seen downstairs in the clinic for follow-up, but due to chest pain and being visibly distraught she was sent to the ED for evaluation.  Patient presents to the ED from cardiology clinic for evaluation of epigastric/lower chest pain.  She is tearful, uncomfortable and reports severe pain since last night.  Reports spitting up and vomiting alongside its without lower abdominal pain, dysuria, diarrhea, syncope, or shortness of breath.  Physical Exam   Triage Vital Signs: ED Triage Vitals  Enc Vitals Group     BP 11/21/21 1046 (!) 193/122     Pulse Rate 11/21/21 1044 91     Resp 11/21/21 1044 19     Temp 11/21/21 1044 98 F (36.7 C)     Temp Source 11/21/21 1334 Oral     SpO2 11/21/21 1044 99 %     Weight --      Height --      Head Circumference --      Peak Flow --      Pain Score 11/21/21 1043 4     Pain Loc --      Pain Edu? --      Excl. in [place]? --     Most recent vital signs: Vitals:   11/21/21 1046 11/21/21 1334  BP: (!) 193/122 (!) 136/120  Pulse:  72  Resp:  20  Temp:  97.9 F (36.6 C)  SpO2:  99%    General: Morbidly obese, obviously uncomfortable and rolling around in bed.  Cannot get comfortable. CV:  Good peripheral perfusion. RRR Resp:  Normal effort. CTAB Abd:  No distention.  Minimal epigastric tenderness without peritoneal features.  Abdomen is otherwise benign. MSK:  No deformity noted.  Neuro:  No focal deficits appreciated. Cranial nerves II  through XII intact 5/5 strength and sensation in all 4 extremities Other:  Ambulatory to the restroom independently   ED Results / Procedures / Treatments   Labs (all labs ordered are listed, but only abnormal results are displayed) Labs Reviewed  BASIC METABOLIC PANEL - Abnormal; Notable for the following components:      Result Value   CO2 21 (*)    Glucose, Bld 339 (*)    All other components within normal limits  CBC - Abnormal; Notable for the following components:   WBC 13.1 (*)    RBC 5.43 (*)    MCV 77.9 (*)    MCH 25.4 (*)    All other components within normal limits  HEPATIC FUNCTION PANEL  LIPASE, BLOOD  URINALYSIS, COMPLETE (UACMP) WITH MICROSCOPIC  POC URINE PREG, ED  TROPONIN I (HIGH SENSITIVITY)  TROPONIN I (HIGH SENSITIVITY)    EKG Sinus rhythm, rate of 63 bpm.  Normal axis and incomplete right bundle.  Nonspecific ST changes laterally and inferiorly Similar to EKG from September.  RADIOLOGY CXR reviewed by me without evidence of acute cardiopulmonary pathology.  Official radiology report(s): DG Chest 2 View  Result Date: 11/21/2021 CLINICAL DATA:  Chest pain EXAM: CHEST - 2 VIEW COMPARISON:  06/29/2021 FINDINGS: The heart size and mediastinal contours are within normal limits. Both lungs are clear. No pleural effusion or pneumothorax. The visualized skeletal structures are unremarkable. IMPRESSION: No acute process in the chest. Electronically Signed   By: Guadlupe Spanish M.D.   On: 11/21/2021 14:16    PROCEDURES and INTERVENTIONS:  Procedures  Medications  famotidine (PEPCID) IVPB 20 mg premix (20 mg Intravenous New Bag/Given 11/21/21 1443)  oxyCODONE-acetaminophen (PERCOCET/ROXICET) 5-325 MG per tablet 1 tablet (1 tablet Oral Given by Other 11/21/21 1311)  lactated ringers bolus 1,000 mL (1,000 mLs Intravenous New Bag/Given 11/21/21 1431)  droperidol (INAPSINE) 2.5 MG/ML injection 2.5 mg (2.5 mg Intravenous Given 11/21/21 1430)  metoCLOPramide (REGLAN)  injection 10 mg (10 mg Intravenous Given 11/21/21 1428)     IMPRESSION / MDM / ASSESSMENT AND PLAN / ED COURSE  I reviewed the triage vital signs and the nursing notes.  41 year old female presents to the ED with epigastric and chest pain, possibly GI in etiology.  She is obviously uncomfortable on my initial evaluation and has some epigastric tenderness without peritoneal discomfort.  Blood work with hyperglycemia without anion gap suggest DKA.  Mild leukocytosis is noted and nonspecific.  Normal LFTs, lipase and troponin.  Resolution of symptoms after Reglan, droperidol and fluids.  Awaiting second troponin due to her history of coronary disease, but less likely represent ACS.  She remains this well-appearing, tolerating p.o. and her second troponin is negative and I believe she would be suitable for outpatient management.  I wrote a prescription for Reglan.  Clinical Course as of 11/21/21 1508  Tue Nov 21, 2021  1505 Reassessed.  Feeling much better. [DS]  1507 Reassessed.  Feeling much better.  Is actually still in quiet now.  Reports improving symptoms.  Awaiting second troponin. [DS]    Clinical Course User Index [DS] Delton Prairie, MD     FINAL CLINICAL IMPRESSION(S) / ED DIAGNOSES   Final diagnoses:  Other chest pain  Gastroparesis     Rx / DC Orders   ED Discharge Orders          Ordered    metoCLOPramide (REGLAN) 10 MG tablet  Every 8 hours PRN        11/21/21 1507             Note:  This document was prepared using Dragon voice recognition software and may include unintentional dictation errors.   Delton Prairie, MD 11/21/21 [phone]

## 2021-12-26 ENCOUNTER — Other Ambulatory Visit: Payer: Self-pay

## 2021-12-26 ENCOUNTER — Encounter: Payer: Self-pay | Admitting: *Deleted

## 2021-12-26 ENCOUNTER — Emergency Department
Admission: EM | Admit: 2021-12-26 | Discharge: 2021-12-26 | Disposition: A | Discharge status: Home / Self Care | Payer: Medicaid Other | Attending: Emergency Medicine | Admitting: Emergency Medicine

## 2021-12-26 ENCOUNTER — Emergency Department: Payer: Medicaid Other

## 2021-12-26 DIAGNOSIS — E119 Type 2 diabetes mellitus without complications: Secondary | ICD-10-CM | POA: Insufficient documentation

## 2021-12-26 DIAGNOSIS — K3184 Gastroparesis: Secondary | ICD-10-CM | POA: Insufficient documentation

## 2021-12-26 DIAGNOSIS — R1013 Epigastric pain: Secondary | ICD-10-CM | POA: Diagnosis present

## 2021-12-26 DIAGNOSIS — I2510 Atherosclerotic heart disease of native coronary artery without angina pectoris: Secondary | ICD-10-CM | POA: Insufficient documentation

## 2021-12-26 LAB — CBC
HCT: 42.2 % (ref 36.0–46.0)
Hemoglobin: 13.5 g/dL (ref 12.0–15.0)
MCH: 25.2 pg — ABNORMAL LOW (ref 26.0–34.0)
MCHC: 32 g/dL (ref 30.0–36.0)
MCV: 78.7 fL — ABNORMAL LOW (ref 80.0–100.0)
Platelets: 366 10*3/uL (ref 150–400)
RBC: 5.36 MIL/uL — ABNORMAL HIGH (ref 3.87–5.11)
RDW: 15.2 % (ref 11.5–15.5)
WBC: 14.6 10*3/uL — ABNORMAL HIGH (ref 4.0–10.5)
nRBC: 0 % (ref 0.0–0.2)

## 2021-12-26 LAB — BASIC METABOLIC PANEL
Anion gap: 11 (ref 5–15)
BUN: 8 mg/dL (ref 6–20)
CO2: 24 mmol/L (ref 22–32)
Calcium: 8.9 mg/dL (ref 8.9–10.3)
Chloride: 96 mmol/L — ABNORMAL LOW (ref 98–111)
Creatinine, Ser: 0.55 mg/dL (ref 0.44–1.00)
GFR, Estimated: >60 mL/min (ref >60)
Glucose, Bld: 330 mg/dL — ABNORMAL HIGH (ref 70–99)
Potassium: 3.2 mmol/L — ABNORMAL LOW (ref 3.5–5.1)
Sodium: 131 mmol/L — ABNORMAL LOW (ref 135–145)

## 2021-12-26 LAB — TROPONIN I (HIGH SENSITIVITY)
Troponin I (High Sensitivity): 9 ng/L (ref <18)
Troponin I (High Sensitivity): 8 ng/L (ref <18)

## 2021-12-26 MED ORDER — POTASSIUM CHLORIDE CRYS ER 20 MEQ PO TBCR
40.0000 meq | EXTENDED_RELEASE_TABLET | Freq: Once | ORAL | Status: AC
  Administered 2021-12-26: 40 meq via ORAL
  Filled 2021-12-26: qty 2

## 2021-12-26 MED ORDER — METOCLOPRAMIDE HCL 5 MG/ML IJ SOLN
10.0000 mg | Freq: Once | INTRAMUSCULAR | Status: AC
  Administered 2021-12-26: 10 mg via INTRAMUSCULAR
  Filled 2021-12-26: qty 2

## 2021-12-26 MED ORDER — METOCLOPRAMIDE HCL 10 MG PO TABS
10.0000 mg | ORAL_TABLET | Freq: Once | ORAL | Status: AC
  Administered 2021-12-26: 10 mg via ORAL
  Filled 2021-12-26: qty 1

## 2021-12-26 MED ORDER — DROPERIDOL 2.5 MG/ML IJ SOLN
2.5000 mg | Freq: Once | INTRAMUSCULAR | Status: AC
Start: 2021-12-26 — End: 2021-12-26
  Administered 2021-12-26: 2.5 mg via INTRAMUSCULAR
  Filled 2021-12-26: qty 2

## 2021-12-26 NOTE — ED Notes (Signed)
Pt discharge information reviewed. Pt understands need for follow up care and when to return if symptoms worsen. All questions answered. Pt is alert and oriented with even and regular respirations. Pt is seen ambulating out of department with strong steady gait. Per pts permission, pts husband is notified of discharge information. ?

## 2021-12-26 NOTE — ED Triage Notes (Signed)
FIRST NURSE NOTE:   ?Pt via EMS from home. Pt c/o CP that started 2 hours, pt had cardiac stents placed in August. Pt has not taken her medication in 2 days because of NV. Pt is A&Ox4 and NAD ? ?110 HR  ?120/90  ?EMS gave 324 ASA  ?

## 2021-12-26 NOTE — ED Triage Notes (Signed)
Pt brought in via ems from home with abd pain for 2 days.   Pt has n/v.  No diarrhea.  Pt also reports pain in chest.  Pt crying out, moving about in wheelchair. Pt alert.    Pt states it feels like something is moving inside my body.  ?

## 2021-12-26 NOTE — ED Provider Notes (Signed)
? ?[facility] ?Provider Note ? ? ? Event Date/Time  ? First MD Initiated Contact with Patient 12/26/21 1932   ?  (approximate) ? ? ?History  ? ?Abdominal Pain and Chest Pain ? ? ?HPI ? ?Karen Ruiz is a 41 y.o. female who presents to the ED for evaluation of Abdominal Pain and Chest Pain ?  ? ?History of obesity, diabetic gastroparesis and cyclic vomiting syndrome. ?CAD s/p NSTEMI in August.  I review outpatient cardiology visit from September.  PCI to the RCA.  ? ?41 year old female presents to the ED with epigastric pain and recurrent emesis over the past 24 hours or so.  She reports epigastric pain, sometimes radiating up towards her chest after she has multiple episodes of vomiting.  Denies any primary chest pain when she is not heaving.  Reports recurrent emesis and inability to keep anything down concerns for dehydration. ? ?Denies any diarrhea, lower abdominal pain, dysuria, cough, shortness of breath, fever or syncopal episodes.  Reports occasionally using cannabis still. ? ?Physical Exam  ? ?Triage Vital Signs: ?ED Triage Vitals  ?Enc Vitals Group  ?   BP 12/26/21 1832 (!) 159/109  ?   Pulse Rate 12/26/21 1832 97  ?   Resp 12/26/21 1832 (!) 22  ?   Temp 12/26/21 1832 98.2 ?F (36.8 ?C)  ?   Temp Source 12/26/21 1832 Oral  ?   SpO2 12/26/21 1832 98 %  ?   Weight 12/26/21 1827 240 lb (108.9 kg)  ?   Height 12/26/21 1827 5\' 6"  (1.676 m)  ?   Head Circumference --   ?   Peak Flow --   ?   Pain Score 12/26/21 1827 10  ?   Pain Loc --   ?   Pain Edu? --   ?   Excl. in [place]? --   ? ? ?Most recent vital signs: ?Vitals:  ? 12/26/21 1832 12/26/21 2048  ?BP: (!) 159/109 (!) 157/110  ?Pulse: 97 (!) 104  ?Resp: (!) 22 18  ?Temp: 98.2 ?F (36.8 ?C)   ?SpO2: 98% 96%  ? ? ?General: Awake, no distress.  Morbidly obese.  Appears uncomfortable.  Lying on her side beneath a [employer] blanket. ?CV:  Good peripheral perfusion. RRR ?Resp:  Normal effort.  ?Abd:  No distention.  Soft and benign  throughout ?MSK:  No deformity noted.  ?Neuro:  No focal deficits appreciated. ?Other:   ? ? ?ED Results / Procedures / Treatments  ? ?Labs ?(all labs ordered are listed, but only abnormal results are displayed) ?Labs Reviewed  ?BASIC METABOLIC PANEL - Abnormal; Notable for the following components:  ?    Result Value  ? Sodium 131 (*)   ? Potassium 3.2 (*)   ? Chloride 96 (*)   ? Glucose, Bld 330 (*)   ? All other components within normal limits  ?CBC - Abnormal; Notable for the following components:  ? WBC 14.6 (*)   ? RBC 5.36 (*)   ? MCV 78.7 (*)   ? MCH 25.2 (*)   ? All other components within normal limits  ?POC URINE PREG, ED  ?TROPONIN I (HIGH SENSITIVITY)  ?TROPONIN I (HIGH SENSITIVITY)  ? ? ?EKG ?Sinus rhythm, rate of 79 bpm.  Normal axis and intervals.  No evidence of acute ischemia. ? ?RADIOLOGY ?CXR reviewed by me without evidence of acute cardiopulmonary pathology. ? ?Official radiology report(s): ?DG Chest 2 View ? ?Result Date: 12/26/2021 ?CLINICAL DATA:  Chest  pain abdominal pain EXAM: CHEST - 2 VIEW COMPARISON:  11/21/2021 FINDINGS: The heart size and mediastinal contours are within normal limits. Both lungs are clear. The visualized skeletal structures are unremarkable. IMPRESSION: No active cardiopulmonary disease. Electronically Signed   By: Franchot Gallo M.D.   On: 12/26/2021 18:51   ? ?PROCEDURES and INTERVENTIONS: ? ?.1-3 Lead EKG Interpretation ?Performed by: Vladimir Crofts, MD ?Authorized by: Vladimir Crofts, MD  ? ?  Interpretation: normal   ?  ECG rate:  94 ?  ECG rate assessment: normal   ?  Rhythm: sinus rhythm   ?  Ectopy: none   ?  Conduction: normal   ? ?Medications  ?metoCLOPramide (REGLAN) tablet 10 mg (has no administration in time range)  ?potassium chloride SA (KLOR-CON M) CR tablet 40 mEq (has no administration in time range)  ?droperidol (INAPSINE) 2.5 MG/ML injection 2.5 mg (2.5 mg Intramuscular Given 12/26/21 2035)  ?metoCLOPramide (REGLAN) injection 10 mg (10 mg Intramuscular  Given 12/26/21 2036)  ? ? ? ?IMPRESSION / MDM / ASSESSMENT AND PLAN / ED COURSE  ?I reviewed the triage vital signs and the nursing notes. ? ?41 year old female with history of coronary disease and gastroparesis presents to the ED with epigastric pain and emesis, likely due to gastroparesis versus cyclic vomiting and less likely cardiopulmonary, ultimately suitable for outpatient management.  She appears quite uncomfortable on presentation, but has a benign abdominal examination without evidence of neurologic deficits or trauma.  Her EKG is nonischemic and she has 2 negative high-sensitivity troponins.  Blood work with chronic leukocytosis without acute derangements.  Metabolic panel with hyperglycemia without acidosis and mild hypokalemia, repleted orally.  Pain much better after droperidol and Reglan.  I considered observation admission for this patient, but this is less likely to be ACS or cardiopulmonary and I think since she is now tolerating p.o. intake and feeling better she would be suitable for trial of outpatient management.  Return precautions discussed. ? ?Clinical Course as of 12/26/21 2133  ?Tue Dec 26, 2021  ?2121 Reassessed.  Feeling better and tolerating p.o. intake.  We discussed benign work-up and the probability of gastroparesis causing her symptoms.  We discussed management at home and return precautions.  She is agreeable. [DS]  ?  ?Clinical Course User Index ?[DS] Vladimir Crofts, MD  ? ? ? ?FINAL CLINICAL IMPRESSION(S) / ED DIAGNOSES  ? ?Final diagnoses:  ?Epigastric pain  ?Gastroparesis  ? ? ? ?Rx / DC Orders  ? ?ED Discharge Orders   ? ? None  ? ?  ? ? ? ?Note:  This document was prepared using Dragon voice recognition software and may include unintentional dictation errors. ?  ?Vladimir Crofts, MD ?12/26/21 2134 ? ?

## 2021-12-26 NOTE — ED Notes (Signed)
Antwone (pts husband): [phone] ?Call with updates ?

## 2021-12-30 ENCOUNTER — Emergency Department: Payer: Medicaid Other

## 2021-12-30 ENCOUNTER — Other Ambulatory Visit: Payer: Self-pay

## 2021-12-30 ENCOUNTER — Emergency Department
Admission: EM | Admit: 2021-12-30 | Discharge: 2021-12-30 | Disposition: A | Discharge status: Home / Self Care | Payer: Medicaid Other | Attending: Emergency Medicine | Admitting: Emergency Medicine

## 2021-12-30 DIAGNOSIS — E1165 Type 2 diabetes mellitus with hyperglycemia: Secondary | ICD-10-CM | POA: Insufficient documentation

## 2021-12-30 DIAGNOSIS — I2510 Atherosclerotic heart disease of native coronary artery without angina pectoris: Secondary | ICD-10-CM | POA: Insufficient documentation

## 2021-12-30 DIAGNOSIS — R7989 Other specified abnormal findings of blood chemistry: Secondary | ICD-10-CM | POA: Insufficient documentation

## 2021-12-30 DIAGNOSIS — D72829 Elevated white blood cell count, unspecified: Secondary | ICD-10-CM | POA: Diagnosis not present

## 2021-12-30 DIAGNOSIS — R1084 Generalized abdominal pain: Secondary | ICD-10-CM | POA: Diagnosis present

## 2021-12-30 DIAGNOSIS — E1143 Type 2 diabetes mellitus with diabetic autonomic (poly)neuropathy: Secondary | ICD-10-CM | POA: Diagnosis not present

## 2021-12-30 DIAGNOSIS — R112 Nausea with vomiting, unspecified: Secondary | ICD-10-CM

## 2021-12-30 DIAGNOSIS — E1343 Other specified diabetes mellitus with diabetic autonomic (poly)neuropathy: Secondary | ICD-10-CM

## 2021-12-30 LAB — CBC WITH DIFFERENTIAL/PLATELET
Abs Immature Granulocytes: 0.1 10*3/uL — ABNORMAL HIGH (ref 0.00–0.07)
Basophils Absolute: 0.1 10*3/uL (ref 0.0–0.1)
Basophils Relative: 1 %
Eosinophils Absolute: 0.2 10*3/uL (ref 0.0–0.5)
Eosinophils Relative: 1 %
HCT: 44.7 % (ref 36.0–46.0)
Hemoglobin: 14.5 g/dL (ref 12.0–15.0)
Immature Granulocytes: 1 %
Lymphocytes Relative: 18 %
Lymphs Abs: 2.6 10*3/uL (ref 0.7–4.0)
MCH: 25.3 pg — ABNORMAL LOW (ref 26.0–34.0)
MCHC: 32.4 g/dL (ref 30.0–36.0)
MCV: 78.1 fL — ABNORMAL LOW (ref 80.0–100.0)
Monocytes Absolute: 1.1 10*3/uL — ABNORMAL HIGH (ref 0.1–1.0)
Monocytes Relative: 7 %
Neutro Abs: 11 10*3/uL — ABNORMAL HIGH (ref 1.7–7.7)
Neutrophils Relative %: 72 %
Platelets: 371 10*3/uL (ref 150–400)
RBC: 5.72 MIL/uL — ABNORMAL HIGH (ref 3.87–5.11)
RDW: 15.2 % (ref 11.5–15.5)
WBC: 15 10*3/uL — ABNORMAL HIGH (ref 4.0–10.5)
nRBC: 0 % (ref 0.0–0.2)

## 2021-12-30 LAB — COMPREHENSIVE METABOLIC PANEL
ALT: 43 U/L (ref 0–44)
AST: 23 U/L (ref 15–41)
Albumin: 3.7 g/dL (ref 3.5–5.0)
Alkaline Phosphatase: 78 U/L (ref 38–126)
Anion gap: 11 (ref 5–15)
BUN: 17 mg/dL (ref 6–20)
CO2: 23 mmol/L (ref 22–32)
Calcium: 9 mg/dL (ref 8.9–10.3)
Chloride: 101 mmol/L (ref 98–111)
Creatinine, Ser: 0.65 mg/dL (ref 0.44–1.00)
GFR, Estimated: >60 mL/min (ref >60)
Glucose, Bld: 411 mg/dL — ABNORMAL HIGH (ref 70–99)
Potassium: 3.6 mmol/L (ref 3.5–5.1)
Sodium: 135 mmol/L (ref 135–145)
Total Bilirubin: 0.5 mg/dL (ref 0.3–1.2)
Total Protein: 7.1 g/dL (ref 6.5–8.1)

## 2021-12-30 LAB — CBG MONITORING, ED
Glucose-Capillary: 314 mg/dL — ABNORMAL HIGH (ref 70–99)
Glucose-Capillary: 333 mg/dL — ABNORMAL HIGH (ref 70–99)
Glucose-Capillary: 273 mg/dL — ABNORMAL HIGH (ref 70–99)

## 2021-12-30 LAB — TROPONIN I (HIGH SENSITIVITY): Troponin I (High Sensitivity): 7 ng/L (ref <18)

## 2021-12-30 LAB — HCG, QUANTITATIVE, PREGNANCY: hCG, Beta Chain, Quant, S: 1 m[IU]/mL (ref <5)

## 2021-12-30 LAB — LACTIC ACID, PLASMA: Lactic Acid, Venous: 2.3 mmol/L (ref 0.5–1.9)

## 2021-12-30 LAB — LIPASE, BLOOD: Lipase: 32 U/L (ref 11–51)

## 2021-12-30 MED ORDER — IOHEXOL 300 MG/ML  SOLN
100.0000 mL | Freq: Once | INTRAMUSCULAR | Status: AC | PRN
  Administered 2021-12-30: 100 mL via INTRAVENOUS

## 2021-12-30 MED ORDER — INSULIN ASPART 100 UNIT/ML IJ SOLN
8.0000 [IU] | Freq: Once | INTRAMUSCULAR | Status: AC
  Administered 2021-12-30: 8 [IU] via INTRAVENOUS
  Filled 2021-12-30: qty 1

## 2021-12-30 MED ORDER — DROPERIDOL 2.5 MG/ML IJ SOLN
5.0000 mg | Freq: Once | INTRAMUSCULAR | Status: AC
  Administered 2021-12-30: 5 mg via INTRAVENOUS
  Filled 2021-12-30: qty 2

## 2021-12-30 MED ORDER — METOCLOPRAMIDE HCL 10 MG PO TABS: 10.0000 mg | ORAL_TABLET | Freq: Three times a day (TID) | ORAL | 0 refills | Status: DC | PRN

## 2021-12-30 MED ORDER — INSULIN ASPART 100 UNIT/ML IJ SOLN
5.0000 [IU] | Freq: Once | INTRAMUSCULAR | Status: AC
Start: 2021-12-30 — End: 2021-12-30
  Administered 2021-12-30: 5 [IU] via INTRAVENOUS
  Filled 2021-12-30: qty 1

## 2021-12-30 MED ORDER — DIPHENHYDRAMINE HCL 50 MG/ML IJ SOLN
12.5000 mg | INTRAMUSCULAR | Status: AC
  Administered 2021-12-30: 12.5 mg via INTRAVENOUS
  Filled 2021-12-30: qty 1

## 2021-12-30 NOTE — ED Notes (Signed)
Provided pt with crackers and water/diet ginger ale for PO challenge.  ?

## 2021-12-30 NOTE — ED Triage Notes (Signed)
Patient c/o severe abdominal pain since Sunday. Patient seen here Tuesday for same and dx with gastroparesis. Patient arrived screaming and cursing at staff. ?

## 2021-12-30 NOTE — ED Provider Notes (Signed)
? ?[facility] ?Provider Note ? ? ? Event Date/Time  ? First MD Initiated Contact with Patient 12/30/21 [phone]   ?  (approximate) ? ? ?History  ? ?Abdominal Pain ? ? ?HPI ?Level 5 caveat:  history/ROS limited by acute/critical illness ? ?Karen Ruiz is a 41 y.o. female  Whose medical history includes, but is not limited to, obesity, diabetic gastroparesis, cyclic vomiting syndrome, and at least intermittent marijuana use.  She also has a history of CAD/ACS with an NSTEMI in August 2022.  I reviewed the outpatient cardiology visit in September which documents  PCI to the right RCA. ? ?She presents by EMS this morning for evaluation of severe generalized abdominal pain radiating into her chest.  She had a very similar presentation about 4 days ago and was diagnosed with cyclic vomiting syndrome versus gastroparesis.  On her last visit she admitted to occasional marijuana use. ? ?The patient reports that her abdominal pain started again around 11 PM.  She said it has been constant and is so bad she cannot stand it.  She is writhing around in the bed, screaming loudly and nearly constantly, clutching her blanket to her, thrashing around in the bed.  She will not relax although she will stop screaming when spoken to directly and asked questions, and then she begins screaming again. ? ?Due to her discomfort and degree of anxiety and upset, she is belligerent with her responses, yelling at staff, demanding that we help her and cursing.  She will answer only minimal questions in between yelling.   ? ? ? ?  ? ? ?Physical Exam  ? ?Triage Vital Signs: ?ED Triage Vitals  ?Enc Vitals Group  ?   BP 12/30/21 0617 (!) 195/165  ?   Pulse Rate 12/30/21 0617 85  ?   Resp 12/30/21 0617 20  ?   Temp 12/30/21 0617 97.8 ?F (36.6 ?C)  ?   Temp Source 12/30/21 0617 Oral  ?   SpO2 12/30/21 0617 98 %  ?   Weight 12/30/21 0621 108.9 kg (240 lb)  ?   Height 12/30/21 0621 1.676 m (5\' 6" )  ?   Head Circumference --    ?   Peak Flow --   ?   Pain Score 12/30/21 0620 10  ?   Pain Loc --   ?   Pain Edu? --   ?   Excl. in [place]? --   ? ? ?Most recent vital signs: ?Vitals:  ? 12/30/21 0637 12/30/21 0700  ?BP: (!) 190/101 (!) 178/97  ?Pulse: 72 78  ?Resp: 18 (!) 22  ?Temp:    ?SpO2: 94% 96%  ? ? ? ?General: Awake, Alert, screaming in pain and at ED personnel ?CV:  Good peripheral perfusion.  ?Resp:  Normal effort.  No respiratory difficulty; patient is speaking clearly and yelling loudly. ?Abd:  Obese.  Soft with no distention.  No appreciable tenderness to palpation throughout any quadrants of the abdomen.  The patient is answering my questions while I am palpating.  No apparent rebound or guarding. ?Other:  Anxious and upset, demanding help, screaming that we do not understand the pain she is feeling.  Redirectable with questions. ? ? ?ED Results / Procedures / Treatments  ? ?Labs ?(all labs ordered are listed, but only abnormal results are displayed) ?Labs Reviewed  ?CBC WITH DIFFERENTIAL/PLATELET - Abnormal; Notable for the following components:  ?    Result Value  ? WBC 15.0 (*)   ?  RBC 5.72 (*)   ? MCV 78.1 (*)   ? MCH 25.3 (*)   ? Neutro Abs 11.0 (*)   ? Monocytes Absolute 1.1 (*)   ? Abs Immature Granulocytes 0.10 (*)   ? All other components within normal limits  ?COMPREHENSIVE METABOLIC PANEL - Abnormal; Notable for the following components:  ? Glucose, Bld 411 (*)   ? All other components within normal limits  ?LIPASE, BLOOD  ?LACTIC ACID, PLASMA  ?LACTIC ACID, PLASMA  ?URINE DRUG SCREEN, QUALITATIVE ([facility] ONLY)  ?HCG, QUANTITATIVE, PREGNANCY  ?TROPONIN I (HIGH SENSITIVITY)  ? ? ? ?EKG ? ?Unable to obtain EKG at this time due to her degree of upset and motion artifact, however I reviewed her EKG from 4 days ago and her QTc interval was appropriate in the 430s ms. ? ? ?RADIOLOGY ?Anticipate CT scan after lab verifies no pregnancy and appropriate renal function. ? ? ? ?PROCEDURES: ? ?Critical Care performed:  No ? ?Procedures ? ? ?MEDICATIONS ORDERED IN ED: ?Medications  ?droperidol (INAPSINE) 2.5 MG/ML injection 5 mg (5 mg Intravenous Given 12/30/21 0625)  ?diphenhydrAMINE (BENADRYL) injection 12.5 mg (12.5 mg Intravenous Given 12/30/21 0625)  ? ? ? ?IMPRESSION / MDM / ASSESSMENT AND PLAN / ED COURSE  ?I reviewed the triage vital signs and the nursing notes. ?             ?               ? ?Differential diagnosis includes, but is not limited to, cyclic vomiting syndrome, cannabinoid hyperemesis syndrome, gastroparesis, SBO/ileus.  Much less likely AAS, ACS, acute intra-abdominal infection. ? ?The patient is on the cardiac monitor to evaluate for evidence of arrhythmia and/or significant heart rate changes.  She is also on continuous pulse symmetry. ? ?Patient's vital signs are notable for hypertension but this is likely due to the degree of agitation she is exhibiting.  I reviewed her prior EKG and verify that she has no history of QTc prolongation.  I am afraid that a new EKG tonight would be erroneous due to the degree of movement and agitation she is exhibiting. ? ?I am treating with droperidol 5 mg IV as well as diphenhydramine 12.5 mg IV as an adjuvant agent as well as to help prevent any possible dystonic reaction even though it is less likely with droperidol than with haloperidol. ? ?Given her cardiac history I will obtain a troponin as well as a CBC, CMP, and lipase.  However I strongly doubt ACS.  I reviewed her record from her last ED visit and this presentation sounds more severe but extremely similar. ? ?I do not see she has had any imaging for a couple of years.  I suspect cyclic vomiting versus cannabinoid hyperemesis, but given the presentation and the repeat visit within about 4 days, I will obtain a CT of the abdomen pelvis with IV contrast to rule out SBO/ileus or partial SBO.  This will occur after negative pregnancy test and verification of her renal function. ? ?I am transferring ED care to Dr. Jacqualine Code.   He will follow-up on lab work, determine need for imaging, and reassess the patient. ? ?  ? ? ?FINAL CLINICAL IMPRESSION(S) / ED DIAGNOSES  ? ?Impression:  ?Generalized abdominal pain, most likely cyclic vomiting syndrome  ? ? ? ?Rx / DC Orders  ? ?ED Discharge Orders   ? ? None  ? ?  ? ? ? ?Note:  This document was prepared using Dragon voice  recognition software and may include unintentional dictation errors. ?  ?Hinda Kehr, MD ?12/30/21 [phone] ? ?

## 2021-12-30 NOTE — ED Provider Notes (Signed)
CT ABDOMEN PELVIS W CONTRAST ? ?Result Date: 12/30/2021 ?CLINICAL DATA:  Nausea and vomiting.  07/28/2019 EXAM: CT ABDOMEN AND PELVIS WITH CONTRAST TECHNIQUE: Multidetector CT imaging of the abdomen and pelvis was performed using the standard protocol following bolus administration of intravenous contrast. RADIATION DOSE REDUCTION: This exam was performed according to the departmental dose-optimization program which includes automated exposure control, adjustment of the mA and/or kV according to patient size and/or use of iterative reconstruction technique. CONTRAST:  OMNIPAQUE IOHEXOL 300 MG/ML  SOLN COMPARISON:  None. FINDINGS: Lower chest: Dependent atelectasis in the lung bases, left greater than right. Hepatobiliary: The liver shows diffusely decreased attenuation suggesting fat deposition. Layering tiny calcified gallstones evident. No intrahepatic or extrahepatic biliary dilation. Pancreas: No focal mass lesion. No dilatation of the main duct. No intraparenchymal cyst. No peripancreatic edema. Spleen: No splenomegaly. No focal mass lesion. Adrenals/Urinary Tract: No adrenal nodule or mass. Kidneys unremarkable. No evidence for hydroureter. The urinary bladder appears normal for the degree of distention. Stomach/Bowel: Stomach is unremarkable. No gastric wall thickening. No evidence of outlet obstruction. Duodenum is normally positioned as is the ligament of Treitz. No small bowel wall thickening. No small bowel dilatation. The terminal ileum is normal. The appendix is normal. Colon is diffusely decompressed. Vascular/Lymphatic: There is mild atherosclerotic calcification of the abdominal aorta without aneurysm. There is no gastrohepatic or hepatoduodenal ligament lymphadenopathy. No retroperitoneal or mesenteric lymphadenopathy. No pelvic sidewall lymphadenopathy. Reproductive: The uterus is unremarkable.  There is no adnexal mass. Other: No intraperitoneal free fluid. Musculoskeletal: No worrisome lytic  or sclerotic osseous abnormality. Degenerative changes noted lower lumbar spine. IMPRESSION: 1. No acute findings in the abdomen or pelvis. Specifically, no findings to explain the patient's history of nausea and vomiting. 2. Cholelithiasis. 3. Hepatic steatosis. 4. Aortic Atherosclerosis (ICD10-I70.0). Electronically Signed   By: Kennith Center M.D.   On: 12/30/2021 09:42   ? ? ?CT abdomen pelvis reviewed by me negative for acute findings.  Cholelithiasis is noted as well as hepatic steatosis. ?  Sharyn Creamer, MD ?12/30/21 1038 ? ?

## 2021-12-30 NOTE — ED Notes (Signed)
Patient transported to CT 

## 2021-12-30 NOTE — ED Provider Notes (Signed)
Received patient in signout from Dr. York Cerise.  Presenting with concern of vomiting and severe abdominal pain.  Does have a history of insulin-dependent diabetes and possibly gastroparesis, but also noted concern of possible cyclical vomiting. ? ?Consider CT scan per Dr. York Cerise  ? ?----------------------------------------- ?7:10 AM on 12/30/2021 ?----------------------------------------- ?Patient is currently resting comfortably.  Somnolent.  No distress. ? ?Labs notable for mild elevation of lactic acid, leukocytosis.  Metabolic panel is normal except for elevated glucose of 411.  Will give insulin and recheck glucose for this, no evidence of DKA, no acidosis.  No elevated anion gap.  Lipase is normal troponin normal. ? ?EKG reviewed heart rate 80 ?QRS 110 ?QTc 440 ?Normal sinus rhythm, no evidence of ischemia or ectopy ? ?----------------------------------------- ?8:43 AM on 12/30/2021 ?----------------------------------------- ?As the patient has presented twice now with severe abdominal pain and concerns of vomiting, I have ordered a CT abdomen pelvis to further evaluate.  Pregnancy test negative ? ?----------------------------------------- ?11:42 AM on 12/30/2021 ?----------------------------------------- ? ? ?Clinical Course as of 12/30/21 1142  ?Sat Dec 30, 2021  ?1037 Blood glucose remains elevated after 5 units of insulin.  Additional 8 units ordered.  Patient resting without distress.  No further nausea or vomiting. [MQ]  ?  ?Clinical Course User Index ?[MQ] Sharyn Creamer, MD  ? ? ?Patient reports feeling improved.  Trying crackers and water/juice at this time.  She reports a history of the same symptoms occurring a few times in the past, thought due to gastroparesis.  Does take Reglan regularly at home, and also uses Zofran.  She reports she feels better now.  Attempting p.o. challenge.  If patient doing well after p.o. challenge would anticipate discharge to home.  Patient is agreeable with this  plan ? ?Glucose is improved to 273. ? ?----------------------------------------- ?2:06 PM on 12/30/2021 ?----------------------------------------- ? ?Patient feeling notably improved.  Able to drink water crackers and no further pain nausea or vomiting.  She appears to be improved at this time.  Discussed with the patient she is comfortable with the plan to be discharged and will follow-up with her primary care doctor.  We will represcribe her Reglan she reports that she believes that she is run out or almost out.  Has ample Zofran at home. ? ?Return precautions and treatment recommendations and follow-up discussed with the patient who is agreeable with the plan. ? ? ?  Sharyn Creamer, MD ?12/30/21 1530 ? ?

## 2021-12-30 NOTE — Discharge Instructions (Signed)
If you're unable to see her primary care doctor you may return to the emergency room or go to the [facility] walk-in clinic in 1 or 2 days for reexam. ° °Please return to the emergency room right away if you are to develop a fever, severe nausea, your pain becomes severe or worsens, you are unable to keep food down, begin vomiting any dark or bloody fluid, you develop any dark or bloody stools, feel dehydrated, or other new concerns or symptoms arise. ° °

## 2021-12-30 NOTE — ED Notes (Signed)
Patient is finally calming down and not moving around the stretcher as much. Patient states her pain is a little better. ?

## 2022-03-28 ENCOUNTER — Emergency Department: Payer: Medicaid Other

## 2022-03-28 ENCOUNTER — Emergency Department
Admission: EM | Admit: 2022-03-28 | Discharge: 2022-03-28 | Disposition: A | Discharge status: Home / Self Care | Payer: Medicaid Other | Attending: Emergency Medicine | Admitting: Emergency Medicine

## 2022-03-28 ENCOUNTER — Other Ambulatory Visit: Payer: Self-pay

## 2022-03-28 DIAGNOSIS — E1065 Type 1 diabetes mellitus with hyperglycemia: Secondary | ICD-10-CM | POA: Diagnosis not present

## 2022-03-28 DIAGNOSIS — R079 Chest pain, unspecified: Secondary | ICD-10-CM | POA: Diagnosis not present

## 2022-03-28 DIAGNOSIS — D72829 Elevated white blood cell count, unspecified: Secondary | ICD-10-CM | POA: Diagnosis not present

## 2022-03-28 DIAGNOSIS — R1010 Upper abdominal pain, unspecified: Secondary | ICD-10-CM | POA: Diagnosis present

## 2022-03-28 DIAGNOSIS — G43A0 Cyclical vomiting, not intractable: Secondary | ICD-10-CM | POA: Insufficient documentation

## 2022-03-28 DIAGNOSIS — R1115 Cyclical vomiting syndrome unrelated to migraine: Secondary | ICD-10-CM

## 2022-03-28 LAB — BASIC METABOLIC PANEL
Anion gap: 15 (ref 5–15)
BUN: 15 mg/dL (ref 6–20)
CO2: 21 mmol/L — ABNORMAL LOW (ref 22–32)
Calcium: 9.7 mg/dL (ref 8.9–10.3)
Chloride: 102 mmol/L (ref 98–111)
Creatinine, Ser: 0.67 mg/dL (ref 0.44–1.00)
GFR, Estimated: >60 mL/min (ref >60)
Glucose, Bld: 362 mg/dL — ABNORMAL HIGH (ref 70–99)
Potassium: 3.6 mmol/L (ref 3.5–5.1)
Sodium: 138 mmol/L (ref 135–145)

## 2022-03-28 LAB — CBC
HCT: 44.8 % (ref 36.0–46.0)
Hemoglobin: 14.4 g/dL (ref 12.0–15.0)
MCH: 26.6 pg (ref 26.0–34.0)
MCHC: 32.1 g/dL (ref 30.0–36.0)
MCV: 82.8 fL (ref 80.0–100.0)
Platelets: 343 10*3/uL (ref 150–400)
RBC: 5.41 MIL/uL — ABNORMAL HIGH (ref 3.87–5.11)
RDW: 15.5 % (ref 11.5–15.5)
WBC: 14.9 10*3/uL — ABNORMAL HIGH (ref 4.0–10.5)
nRBC: 0 % (ref 0.0–0.2)

## 2022-03-28 LAB — LIPASE, BLOOD: Lipase: 24 U/L (ref 11–51)

## 2022-03-28 LAB — TROPONIN I (HIGH SENSITIVITY): Troponin I (High Sensitivity): 8 ng/L (ref <18)

## 2022-03-28 LAB — CBG MONITORING, ED: Glucose-Capillary: 230 mg/dL — ABNORMAL HIGH (ref 70–99)

## 2022-03-28 MED ORDER — SODIUM CHLORIDE 0.9 % IV BOLUS
1000.0000 mL | Freq: Once | INTRAVENOUS | Status: AC
  Administered 2022-03-28: 1000 mL via INTRAVENOUS

## 2022-03-28 MED ORDER — ONDANSETRON HCL 4 MG/2ML IJ SOLN
4.0000 mg | INTRAMUSCULAR | Status: AC
  Administered 2022-03-28: 4 mg via INTRAVENOUS
  Filled 2022-03-28: qty 2

## 2022-03-28 MED ORDER — INSULIN ASPART 100 UNIT/ML IJ SOLN
5.0000 [IU] | Freq: Once | INTRAMUSCULAR | Status: AC
  Administered 2022-03-28: 5 [IU] via INTRAVENOUS
  Filled 2022-03-28: qty 1

## 2022-03-28 MED ORDER — ALUM & MAG HYDROXIDE-SIMETH 200-200-20 MG/5ML PO SUSP
15.0000 mL | Freq: Once | ORAL | Status: AC
  Administered 2022-03-28: 15 mL via ORAL
  Filled 2022-03-28: qty 30

## 2022-03-28 MED ORDER — METOCLOPRAMIDE HCL 5 MG/ML IJ SOLN
10.0000 mg | Freq: Once | INTRAMUSCULAR | Status: AC
  Administered 2022-03-28: 10 mg via INTRAVENOUS
  Filled 2022-03-28: qty 2

## 2022-03-28 MED ORDER — ONDANSETRON HCL 4 MG/2ML IJ SOLN
4.0000 mg | Freq: Once | INTRAMUSCULAR | Status: AC
  Administered 2022-03-28: 4 mg via INTRAVENOUS
  Filled 2022-03-28: qty 2

## 2022-03-28 MED ORDER — HYDROMORPHONE HCL 1 MG/ML IJ SOLN
1.0000 mg | INTRAMUSCULAR | Status: AC
  Administered 2022-03-28: 1 mg via INTRAVENOUS
  Filled 2022-03-28: qty 1

## 2022-03-28 NOTE — ED Notes (Signed)
Pt found to be walking up and down the hallways stable with no NAD and once discharge was mentioned pt started screaming and crying stating she is pain again. Pt asked if she had a ride and pt states "my stomach hurts again". Pt choosing not to answer this RN's question at this time about a ride home. Pt given meds for GERD and CP.

## 2022-03-28 NOTE — Inpatient Diabetes Management (Signed)
Inpatient Diabetes Program Recommendations  AACE/ADA: New Consensus Statement on Inpatient Glycemic Control  Target Ranges:  Prepandial:   less than 140 mg/dL      Peak postprandial:   less than 180 mg/dL (1-2 hours)      Critically ill patients:  140 - 180 mg/dL     Latest Reference Range & Units 11/21/21 10:45 12/26/21 18:44 12/30/21 06:24 03/28/22 10:56  Glucose 70 - 99 mg/dL 024 (H) 097 (H) 353 (H) 362 (H)   Review of Glycemic Control  Diabetes history: DM2 Outpatient Diabetes medications: Novolog 0-9 units TID with melas, Metformin XR 500 mg daily Current orders for Inpatient glycemic control: None; in ED  Inpatient Diabetes Program Recommendations:    Insulin: If patient admitted, please consider ordering Semglee 10 units daily, CBGs Q4H, and Novolog 0-9 units Q4H.  NOTE: Patient in ED with abdomina pain, N/V and initial glucose glucose 362 mg/dl on 2/99/24 at 26:83 am. Noted patient has had several ED visits since January 2023 with same complaints and glucose consistently elevated on labs. Patient was last inpatient at [facility]  05/02/21-05/03/21 and diabetes coordinator spoke with patient on 05/03/21 during that admission. Noted in Care Everywhere pat seen DR. Waleh on 06/12/21 and it was noted that patient reported taking Levemir 22 units QHS and Novolog 11 units TID with meals and had stopped Metformin due to upset stomach. No basal insulin noted on current home medication list.   Thanks, Orlando Penner, RN, MSN, CDCES Diabetes Coordinator Inpatient Diabetes Program [phone] (Team Pager from 8am to 5pm)

## 2022-03-28 NOTE — ED Notes (Signed)
Pt presents to ED screaming and crying and pacing in the hallway from pain. Pt states she has ABD pain that started yesterday and has a HX of gastroparesis.

## 2022-03-28 NOTE — ED Notes (Signed)
Pt refusing D/C vitals and screaming and also appears to be recording staff saying "Fuck this hospital you didn't do shit". Pt in NAD

## 2022-03-28 NOTE — Discharge Instructions (Signed)
No driving today.  You were seen in the emergency room for abdominal pain. It is important that you follow up closely with your primary care doctor in the next couple of days.   Please return to the emergency room right away if you are to develop a fever, severe nausea, your pain becomes severe or worsens, you are unable to keep food down, begin vomiting any dark or bloody fluid, you develop any dark or bloody stools, feel dehydrated, or other new concerns or symptoms arise.

## 2022-03-28 NOTE — ED Provider Notes (Signed)
[facility] Provider Note    Event Date/Time   First MD Initiated Contact with Patient 03/28/22 1151     (approximate)   History   Chest Pain and Abdominal Pain   HPI  Karen Ruiz is a 41 y.o. female with a history of recurrent episodes of vomiting, gastritis and possible "gastroparesis"  Patient reports that she is having another episode.  She has had many episodes of this in the past.  She has severe crampy upper abdominal pain with nausea and vomiting.  It will come out of the blue and started yesterday.  She relates that she does use marijuana to help calm the pain but is also been told neuromelanin may be inducing these episodes in the past.  She has a GI appointment upcoming and scheduled for August.  No fevers or chills.  No chest pain or shortness of breath.  Reports of burning feeling in the back of her throat and esophagus.  She takes Reglan at home  Reports the pain is 10 out of 10 severe and very crampy located upper abdomen.  Reports same symptoms as when she saw me last time, she recalls having seen me a few months ago with similar     Physical Exam   Triage Vital Signs: ED Triage Vitals  Enc Vitals Group     BP 03/28/22 1057 (!) 154/98     Pulse Rate 03/28/22 1055 100     Resp 03/28/22 1055 (!) 22     Temp 03/28/22 1055 98.7 F (37.1 C)     Temp Source 03/28/22 1055 Oral     SpO2 03/28/22 1055 97 %     Weight 03/28/22 1054 240 lb 1.3 oz (108.9 kg)     Height 03/28/22 1054 5\' 6"  (1.676 m)     Head Circumference --      Peak Flow --      Pain Score 03/28/22 1053 10     Pain Loc --      Pain Edu? --      Excl. in GC? --     Most recent vital signs: Vitals:   03/28/22 1055 03/28/22 1057  BP:  (!) 154/98  Pulse: 100   Resp: (!) 22   Temp: 98.7 F (37.1 C)   SpO2: 97%      General: Awake, ambulating back and forth in the hallway, she is sort of walking and moaning in pain.  Her personality is somewhat abrasive.   She reports severe crampy pain.  She appears in severe pain holding her hand over her upper abdomen. CV:  Good peripheral perfusion.  Resp:  Normal effort.  Speaks and hollers in clear sentences. Abd:  No distention.  She is obese.  She reports a diffuse pain.  Deferred abdominal exam as patient sitting up, hollering in pain, requesting to be medicated and very abrasive in her mannerisms.  We will reattempt abdominal exam in detail once pain more controlled or when patient will allow exam. Other:  Moves all extremities well.  Walks with stable gait.   ED Results / Procedures / Treatments   Labs (all labs ordered are listed, but only abnormal results are displayed) Labs Reviewed  BASIC METABOLIC PANEL - Abnormal; Notable for the following components:      Result Value   CO2 21 (*)    Glucose, Bld 362 (*)    All other components within normal limits  CBC - Abnormal; Notable for the following components:  WBC 14.9 (*)    RBC 5.41 (*)    All other components within normal limits  CBG MONITORING, ED - Abnormal; Notable for the following components:   Glucose-Capillary 230 (*)    All other components within normal limits  LIPASE, BLOOD  POC URINE PREG, ED  CBG MONITORING, ED  CBG MONITORING, ED  CBG MONITORING, ED  CBG MONITORING, ED  CBG MONITORING, ED  TROPONIN I (HIGH SENSITIVITY)  TROPONIN I (HIGH SENSITIVITY)     EKG  Interpreted by me at 11 AM heart rate 100 QRS 90 QTc 470 Sinus tachycardia, no evidence of acute ischemia.  QTc interval approximately 470   RADIOLOGY  Chest x-ray interpreted by me as normal      PROCEDURES:  Critical Care performed: No  Procedures   MEDICATIONS ORDERED IN ED: Medications  ondansetron (ZOFRAN) injection 4 mg (4 mg Intravenous Given 03/28/22 1059)  metoCLOPramide (REGLAN) injection 10 mg (10 mg Intravenous Given 03/28/22 1227)  ondansetron (ZOFRAN) injection 4 mg (4 mg Intravenous Given 03/28/22 1227)  HYDROmorphone (DILAUDID)  injection 1 mg (1 mg Intravenous Given 03/28/22 1228)  sodium chloride 0.9 % bolus 1,000 mL (0 mLs Intravenous Stopped 03/28/22 1434)  insulin aspart (novoLOG) injection 5 Units (5 Units Intravenous Given 03/28/22 1228)  alum & mag hydroxide-simeth (MAALOX/MYLANTA) 200-200-20 MG/5ML suspension 15 mL (15 mLs Oral Given 03/28/22 1430)     IMPRESSION / MDM / ASSESSMENT AND PLAN / ED COURSE  I reviewed the triage vital signs and the nursing notes.                              Differential diagnosis includes, but is not limited to, possible cyclical vomiting, cannabinol hyperemesis syndrome, gastroparesis, bowel obstruction, gastritis, esophagitis, etc.  She has had presentations similar in the past and has had fairly extensive work-ups for what she describes as the same symptomatology.  Today we will treat symptomatically, and if improved I think it be very reassuring and I do not think imaging studies would be required.  My pretest probability and concern for acute intra-abdominal etiology such as perforation, appendicitis bowel obstruction, or acute bacterial infection or surgical abnormality is low based on previous history and today's description of her recurrent presentations.  Her presentation seems very similar to previous.  Labs reveal no mild to moderate leukocytosis.  Glucose elevated at 362 with CO2 of 21.  No elevation of anion gap.  No evidence of DKA.  She has a known type I diabetic, will give dose of insulin and recheck glucose thereafter.  Patient's presentation is most consistent with severe exacerbation of chronic illness.  The patient refused to keep cardiac monitoring equipment on per nurse [facility]  Clinical Course as of 03/28/22 1706  Wed Mar 28, 2022  1331 The patient is resting much more comfortably at this time. [MQ]    Clinical Course User Index [MQ] Sharyn Creamer, MD   ----------------------------------------- 2:22 PM on  03/28/2022 ----------------------------------------- Patient ambulating with IV pole.  Feels much improved.  She reports she feels much better comfortable with plan to go back to her house.  She no longer appears to be in any pain.  Customary abdominal pain return precautions.  Has follow-up planned with GI in August.  She reports she has ample Reglan, Zofran "does not work" for her, and does not require prescription for any further medication.  Return precautions and treatment recommendations and follow-up (GI) discussed with the  patient and she has Gi appointmetn setup for August per patient..  She is not driving today.  Plans to have someone pick her up and take her home.  Overall she appears much improved, ambulatory no distress no further abdominal pain or vomiting.  She would like a dose of Maalox before she goes due to reflux, have ordered.  FINAL CLINICAL IMPRESSION(S) / ED DIAGNOSES   Final diagnoses:  Cyclical vomiting     Rx / DC Orders   ED Discharge Orders     None        Note:  This document was prepared using Dragon voice recognition software and may include unintentional dictation errors.   Sharyn Creamer, MD 03/28/22 [phone]

## 2022-03-28 NOTE — ED Notes (Signed)
Pt refusing vitals and cardiac monitoring at this time.

## 2022-03-28 NOTE — ED Triage Notes (Signed)
Pt here with CP and abd pain that started yesterday afternoon. Pt states she has gastroparesis and has been vomiting all night. Pt states pain is generalized and the CP is centered and does not radiate.

## 2022-07-29 ENCOUNTER — Emergency Department
Admission: EM | Admit: 2022-07-29 | Discharge: 2022-07-29 | Disposition: A | Discharge status: Home / Self Care | Payer: Medicaid Other | Attending: Emergency Medicine | Admitting: Emergency Medicine

## 2022-07-29 ENCOUNTER — Encounter: Payer: Self-pay | Admitting: Emergency Medicine

## 2022-07-29 ENCOUNTER — Other Ambulatory Visit: Payer: Self-pay

## 2022-07-29 DIAGNOSIS — I2510 Atherosclerotic heart disease of native coronary artery without angina pectoris: Secondary | ICD-10-CM | POA: Diagnosis not present

## 2022-07-29 DIAGNOSIS — F17200 Nicotine dependence, unspecified, uncomplicated: Secondary | ICD-10-CM | POA: Diagnosis not present

## 2022-07-29 DIAGNOSIS — D72829 Elevated white blood cell count, unspecified: Secondary | ICD-10-CM | POA: Insufficient documentation

## 2022-07-29 DIAGNOSIS — I10 Essential (primary) hypertension: Secondary | ICD-10-CM | POA: Insufficient documentation

## 2022-07-29 DIAGNOSIS — E1165 Type 2 diabetes mellitus with hyperglycemia: Secondary | ICD-10-CM | POA: Insufficient documentation

## 2022-07-29 DIAGNOSIS — R1084 Generalized abdominal pain: Secondary | ICD-10-CM

## 2022-07-29 DIAGNOSIS — R109 Unspecified abdominal pain: Secondary | ICD-10-CM | POA: Diagnosis present

## 2022-07-29 DIAGNOSIS — R112 Nausea with vomiting, unspecified: Secondary | ICD-10-CM | POA: Diagnosis not present

## 2022-07-29 LAB — COMPREHENSIVE METABOLIC PANEL
ALT: 33 U/L (ref 0–44)
AST: 28 U/L (ref 15–41)
Albumin: 4 g/dL (ref 3.5–5.0)
Alkaline Phosphatase: 80 U/L (ref 38–126)
Anion gap: 12 (ref 5–15)
BUN: 11 mg/dL (ref 6–20)
CO2: 20 mmol/L — ABNORMAL LOW (ref 22–32)
Calcium: 9 mg/dL (ref 8.9–10.3)
Chloride: 107 mmol/L (ref 98–111)
Creatinine, Ser: 0.66 mg/dL (ref 0.44–1.00)
GFR, Estimated: >60 mL/min (ref >60)
Glucose, Bld: 338 mg/dL — ABNORMAL HIGH (ref 70–99)
Potassium: 3.7 mmol/L (ref 3.5–5.1)
Sodium: 139 mmol/L (ref 135–145)
Total Bilirubin: 0.7 mg/dL (ref 0.3–1.2)
Total Protein: 7.3 g/dL (ref 6.5–8.1)

## 2022-07-29 LAB — CBC
HCT: 43 % (ref 36.0–46.0)
Hemoglobin: 14 g/dL (ref 12.0–15.0)
MCH: 27 pg (ref 26.0–34.0)
MCHC: 32.6 g/dL (ref 30.0–36.0)
MCV: 83 fL (ref 80.0–100.0)
Platelets: 326 10*3/uL (ref 150–400)
RBC: 5.18 MIL/uL — ABNORMAL HIGH (ref 3.87–5.11)
RDW: 14.8 % (ref 11.5–15.5)
WBC: 14.8 10*3/uL — ABNORMAL HIGH (ref 4.0–10.5)
nRBC: 0 % (ref 0.0–0.2)

## 2022-07-29 LAB — TROPONIN I (HIGH SENSITIVITY): Troponin I (High Sensitivity): 6 ng/L (ref <18)

## 2022-07-29 LAB — LIPASE, BLOOD: Lipase: 27 U/L (ref 11–51)

## 2022-07-29 MED ORDER — HALOPERIDOL LACTATE 5 MG/ML IJ SOLN
2.0000 mg | Freq: Once | INTRAMUSCULAR | Status: AC
  Administered 2022-07-29: 2 mg via INTRAVENOUS
  Filled 2022-07-29: qty 1

## 2022-07-29 MED ORDER — METOCLOPRAMIDE HCL 10 MG PO TABS: 10.0000 mg | ORAL_TABLET | Freq: Two times a day (BID) | ORAL | 0 refills | Status: DC | PRN

## 2022-07-29 MED ORDER — SODIUM CHLORIDE 0.9 % IV BOLUS
1000.0000 mL | Freq: Once | INTRAVENOUS | Status: AC
  Administered 2022-07-29: 1000 mL via INTRAVENOUS

## 2022-07-29 NOTE — Discharge Instructions (Signed)
Please return for any new, worsening, or change in symptoms or other concerns. 

## 2022-07-29 NOTE — ED Triage Notes (Signed)
Pt to triage via EMS with abdominal pain and vomiting. States it is "way worse this time"  Pt screaming at time of triage and rolling around floor/recliner.

## 2022-07-29 NOTE — ED Notes (Addendum)
Pt's ride in room, yelling at this rn calling this RN an ass when asking to review pt's results with him. Asked pt if I can review information with ride who has just arrived in room and pt's ride continues to call this RN ass and yell and scream at this rn. Provider notified that pt's ride would like to speak with him. Security called.

## 2022-07-29 NOTE — ED Triage Notes (Signed)
Pt in via EMS from home with c/o chronic GI issues. EMS reports per pt she took asa prior to their arrival. Pt screaming in the [place]

## 2022-07-29 NOTE — ED Notes (Signed)
Patient is agitated, screaming loudly, getting off and on stretcher, throwing herself on the stretcher. Patient was advised that medication was just given and needs time to work and she is fighting against it. Patient states she can't stay still. Eliezer Lofts Poggi PA-C aware.

## 2022-07-29 NOTE — ED Notes (Signed)
Pt screaming she "can't wait for a room"  hitting the wall and ekg machine

## 2022-07-29 NOTE — ED Provider Notes (Signed)
[facility] Provider Note    Event Date/Time   First MD Initiated Contact with Patient 07/29/22 1618     (approximate)   History   Abdominal Pain   HPI  Karen Ruiz is a 41 y.o. female with a past medical history of type 2 diabetes, CAD, tobacco abuse, hypertension who presents today for evaluation of abdominal pain.  Patient reports that she has had this pain before in the past, multiple times and says "it is my gastroparesis and I know it."  She reports that it got worse around 3 AM this morning.  Reports that it feels exactly the same as it has before.  She denies chest pain or shortness of breath.  She reports that she is also having nausea and has had dry heaving.  She is writhing around in the bed, moaning loudly and constantly, thrashing around on the stretcher.  She will not relax, although she will stop screaming when I asked her specific questions, which she will answer, and then she begins screaming again.  She is belligerent with her responses, yelling at staff, demanding that we help her, and only answers questions minimally between yelling.  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 --      Excl. in GC? --     Most recent vital signs: Vitals:   07/29/22 1451  BP: (!) 137/98  Pulse: 60  Resp: (!) 21  Temp: 98.4 F (36.9 C)  SpO2: 98%    Physical Exam Vitals and nursing note reviewed.  Constitutional:      General: Awake and alert.  Writhing around on the stretcher, throwing her body across the stretcher, yelling, answering minimally, though stops writhing in order to answer questions and then begins writhing again    Appearance: Normal appearance. The patient is obese.  HENT:     Head: Normocephalic and atraumatic.     Mouth: Mucous membranes are moist.  Eyes:     General: PERRL. Normal EOMs        Right eye: No discharge.        Left eye: No discharge.     Conjunctiva/sclera: Conjunctivae normal.  Cardiovascular:     Rate and Rhythm: Normal rate and regular rhythm.     Pulses: Normal pulses.  Pulmonary:     Effort: Pulmonary effort is normal. No respiratory distress.     Breath sounds: Normal breath sounds.  Abdominal:     Abdomen is soft. There is no abdominal tenderness. No rebound or guarding. No distention. Musculoskeletal:        General: No swelling. Normal range of motion.     Cervical back: Normal range of motion and neck supple.  Skin:    General: Skin is warm and dry.     Capillary Refill: Capillary refill takes less than 2 seconds.     Findings: No rash.  Neurological:     Mental Status: The patient is awake and alert.      ED Results / Procedures / Treatments   Labs (all labs ordered are listed, but only abnormal results are displayed) Labs Reviewed  COMPREHENSIVE METABOLIC PANEL - Abnormal; Notable for the following components:      Result Value   CO2 20 (*)     Glucose, Bld 338 (*)    All other components within normal limits  CBC - Abnormal; Notable for the following components:   WBC 14.8 (*)    RBC 5.18 (*)    All other components within normal limits  LIPASE, BLOOD  URINALYSIS, ROUTINE W REFLEX MICROSCOPIC  POC URINE PREG, ED  TROPONIN I (HIGH SENSITIVITY)  TROPONIN I (HIGH SENSITIVITY)     EKG     RADIOLOGY     PROCEDURES:  Critical Care performed:   Procedures   MEDICATIONS ORDERED IN ED: Medications  haloperidol lactate (HALDOL) injection 2 mg (2 mg Intravenous Given 07/29/22 1716)  sodium chloride 0.9 % bolus 1,000 mL (0 mLs Intravenous Stopped 07/29/22 1903)     IMPRESSION / MDM / ASSESSMENT AND PLAN / ED COURSE  I reviewed the triage vital signs and the nursing notes.   Differential diagnosis includes, but is not limited to, gastroparesis, marijuana induced hyperemesis, cyclic vomiting syndrome.  Less likely surgical abdomen at this time.  Patient reports that her presentation today is exactly same as her previous presentations.  She is requesting Dilaudid.  However, will give Haldol IV first as this is consistent with cyclic vomiting syndrome at this time.  QTc is 449 currently.  I reviewed the patient's chart.  Patient has been seen in the emergency department multiple times for similar presentation.  She most recently had a CAT scan on 12/30/2021 which was normal.  Labs obtained at triage reveal a leukocytosis to 14, similar to previous.  She also was noted to be hyperglycemic to 338, though no increased anion gap, do not suspect DKA.  Patient responded well to the Haldol.  She was reevaluated multiple times throughout her emergency department stay, and reports feeling significantly improved.  She was resting comfortably, no more thrashing or writhing, reports that she does not have any belly pain, nausea, or chest pain.  She asked that her husband be called.  When the husband arrived, patient began writhing  again, and both husband and patient became verbally abusive to staff.  Multiple providers at bedside.  I do still feel that patient's presentation is consistent with her previous presentations, and patient also feels certain that her presentation is the same as her previous exacerbations.  She is concerned about what will happen when she goes home if her pain returns.  She was given a prescription for  Reglan.  We discussed that she should only take this as prescribed and not more than prescribed.  I also recommended close outpatient follow-up.  I advised her that if her symptoms return she may return to the emergency department at any point for continued care.  Eventually, both patient and husband were able to be talked down, and they left in stable condition.  Patient was discussed with Dr. Larinda Buttery who agrees with assessment and plan.   Patient's presentation is most consistent with severe exacerbation of chronic illness.   Clinical Course as of 07/29/22 2233  Wynelle Link Jul 29, 2022  5784 Patient reports that she feels improved, sleeping but easily arousable [JP]  1853 Re-evaluated, reports feeling better and asked for her ride to be called [JP]  1915 At the time of discharge, patient began throwing herself around again, and cursing at staff. Husband also cursing at staff. I was able to talk them down, patient remains appropriate for discharge [JP]    Clinical Course User Index [JP] Woodie Degraffenreid, Herb Grays, PA-C     FINAL CLINICAL IMPRESSION(S) / ED DIAGNOSES   Final diagnoses:  Generalized abdominal pain  Nausea and vomiting, unspecified vomiting type     Rx / DC Orders   ED Discharge Orders          Ordered    metoCLOPramide (REGLAN) 10 MG tablet  2 times daily PRN        07/29/22 1908             Note:  This document was prepared using Dragon voice recognition software and may include unintentional dictation errors.   Keturah Shavers 07/29/22 2233    Chesley Noon,  MD 07/30/22 [phone]

## 2022-07-29 NOTE — ED Notes (Signed)
Pt refused final vital signs.

## 2022-07-29 NOTE — ED Notes (Signed)
PT declines when offered zofran " it won't do nothing" "I'm talking about my chest hurting now"

## 2022-08-27 IMAGING — CR DG CHEST 2V
2 series · 2 of 2 positions shown · non-contrast
Comparison: 11/21/2021

CLINICAL DATA: Chest pain abdominal pain

EXAM:
CHEST - 2 VIEW

[chest pa]
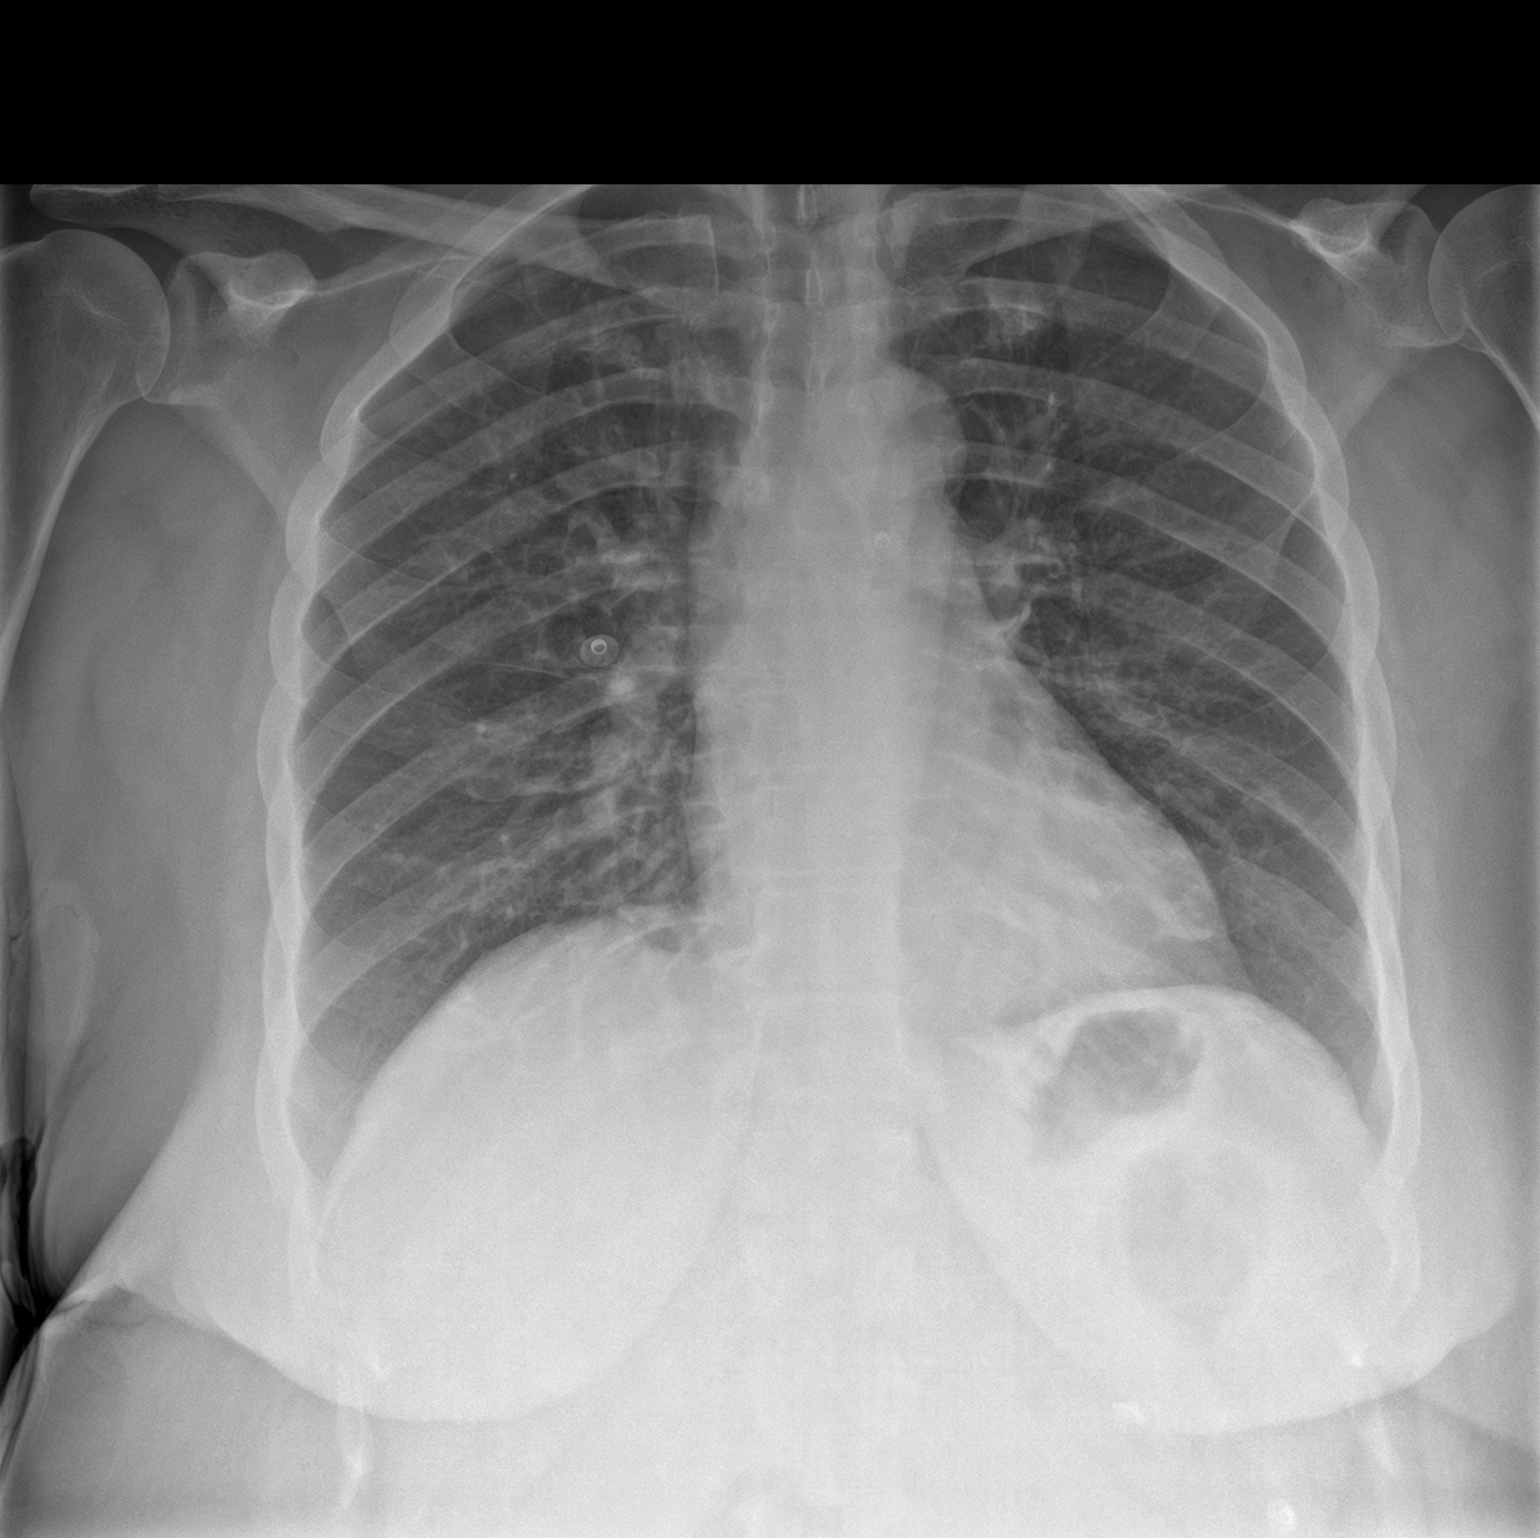

[chest lat]
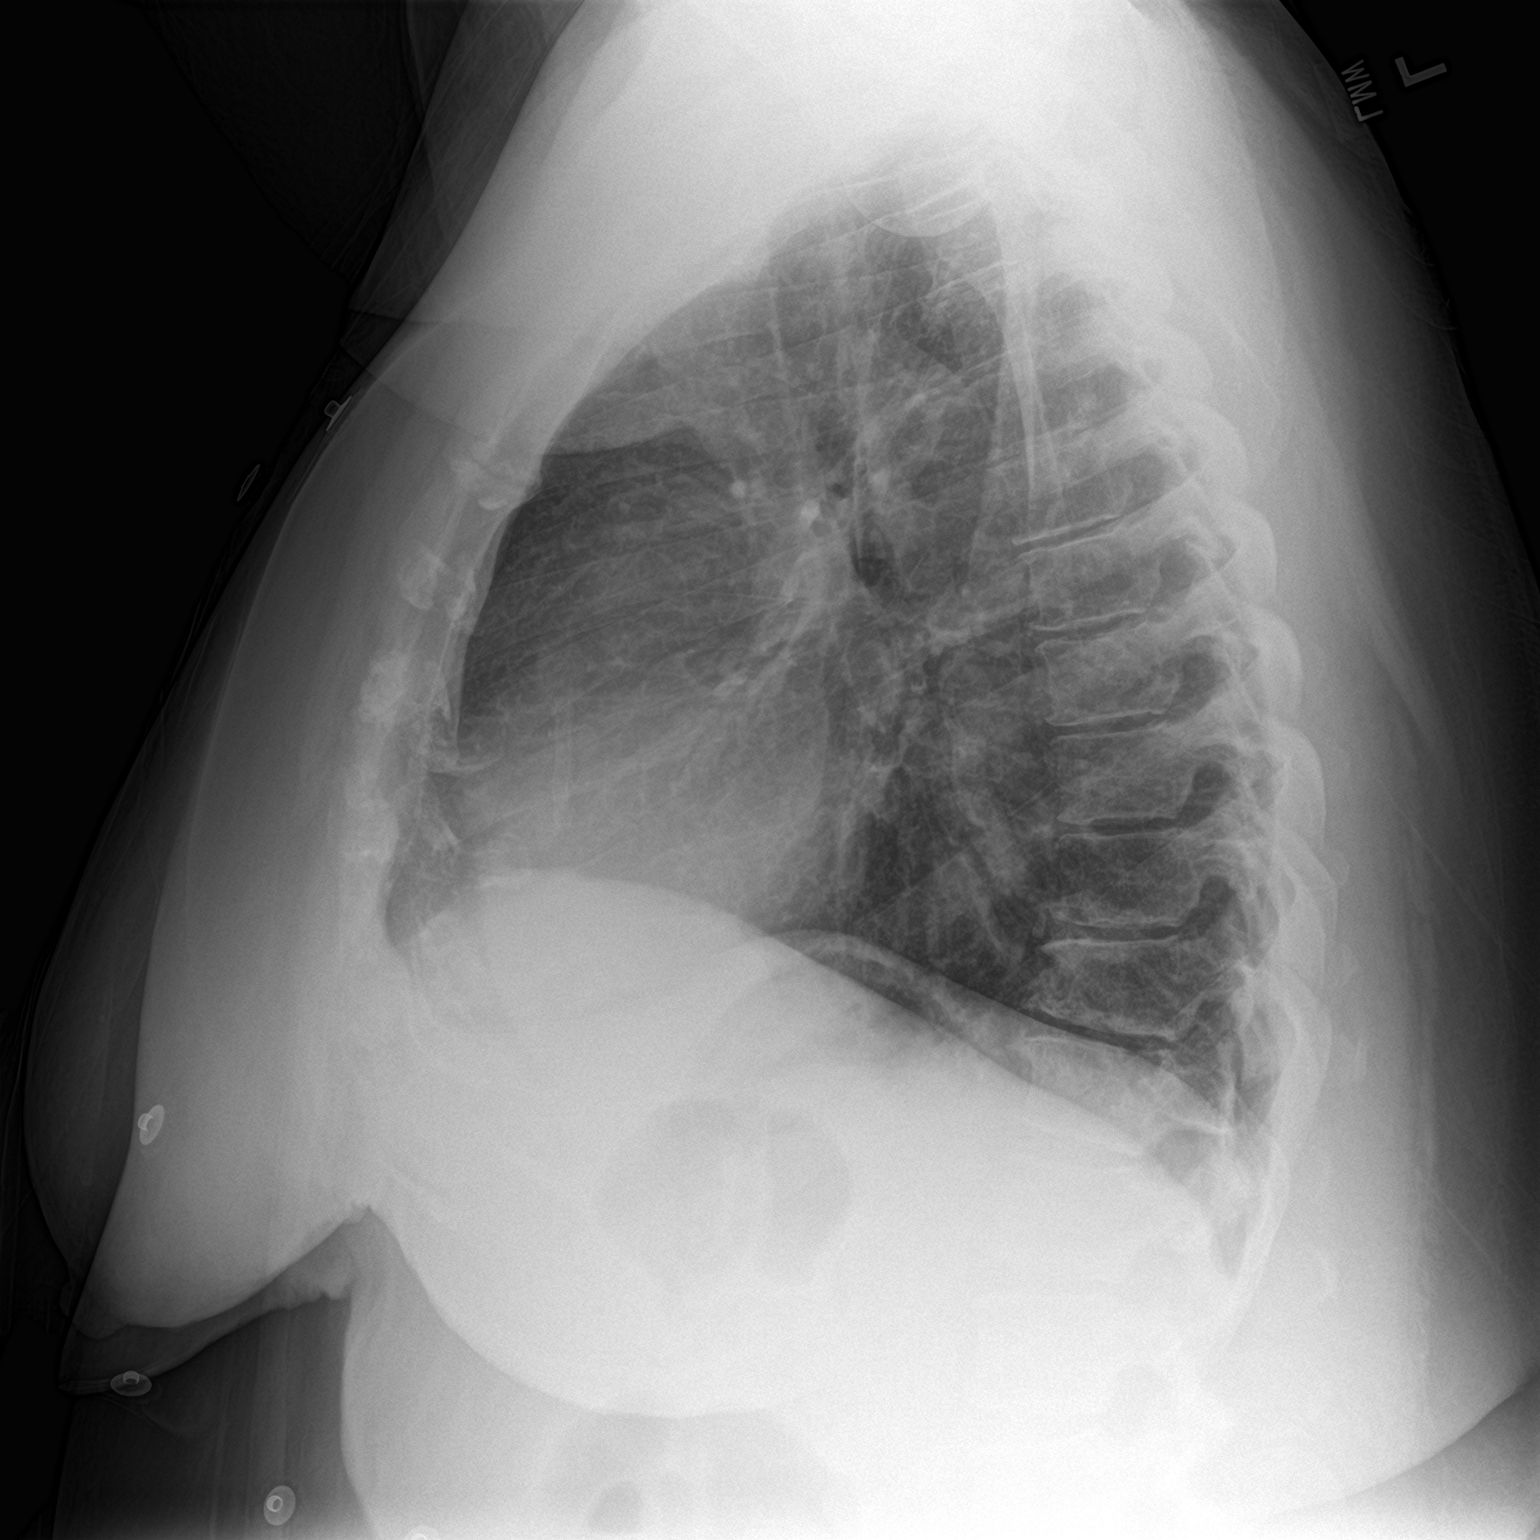

[2 of 2 positions shown; findings below may reference images not displayed]

FINDINGS: The heart size and mediastinal contours are within normal limits.
Both lungs are clear. The visualized skeletal structures are
unremarkable.
IMPRESSION: No active cardiopulmonary disease.

## 2022-08-31 IMAGING — CT CT ABD-PELV W/ CM
2 of 5 series · 16 of 46 positions shown, 18 images · IV contrast (agent unspecified)
Comparison: None.

CLINICAL DATA: Nausea and vomiting.  07/28/2019

EXAM:
CT ABDOMEN AND PELVIS WITH CONTRAST
TECHNIQUE: Multidetector CT imaging of the abdomen and pelvis was performed
using the standard protocol following bolus administration of
intravenous contrast.

[Series 2: abdomen 5.0 · axial · 0.79mm/px · z∈[-933,-468]mm · 13 of 109 slices shown, 15 images]
[im 8/109  soft-tissue]
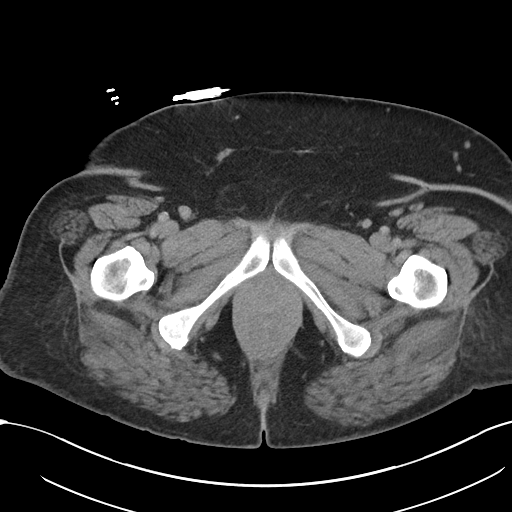
[im 8/109  bone]
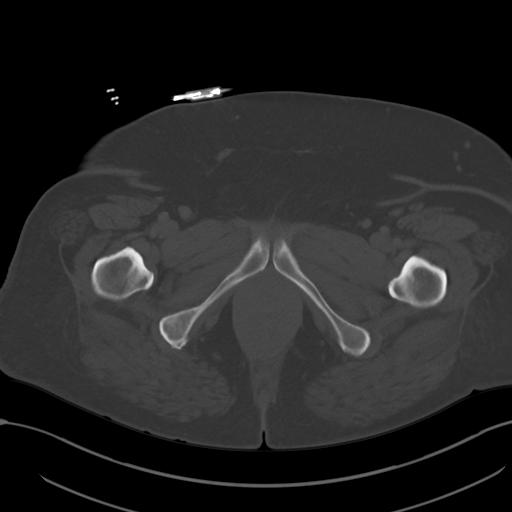
[im 15/109  soft-tissue]
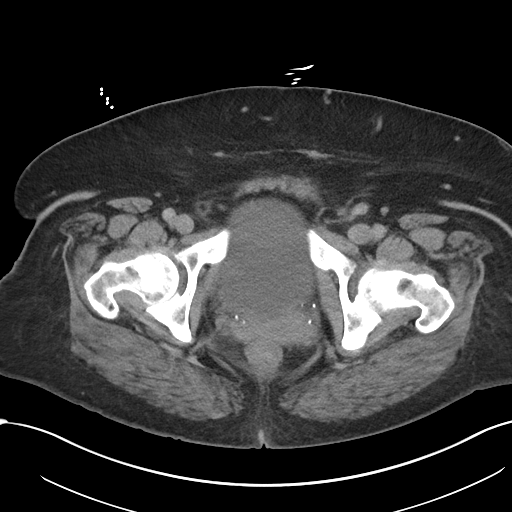
[im 22/109  soft-tissue]
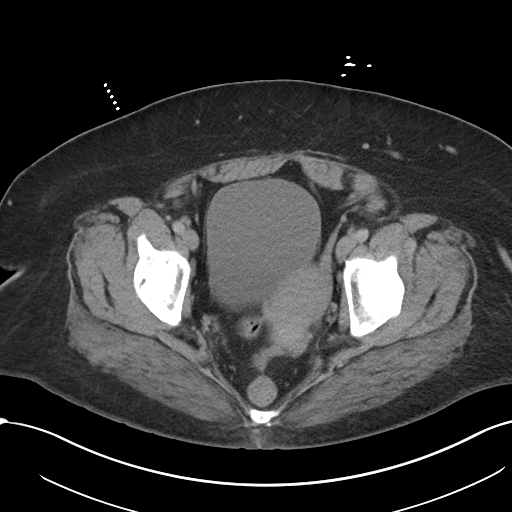
[im 29/109  soft-tissue]
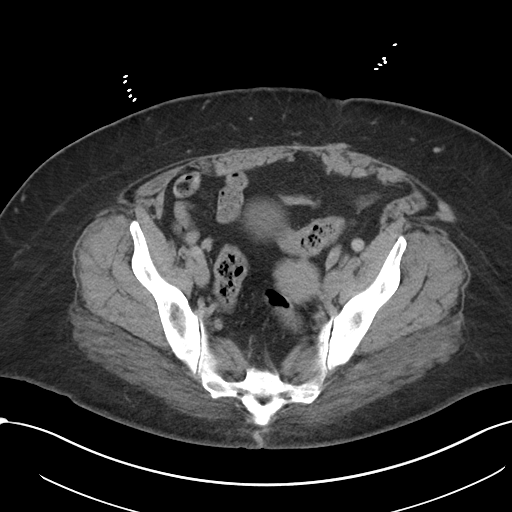
[im 37/109  soft-tissue]
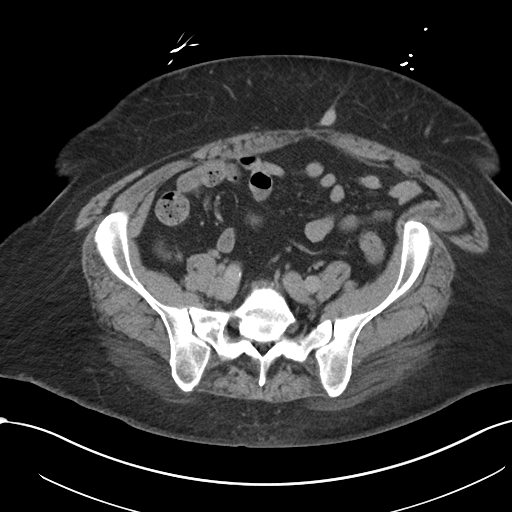
[im 44/109  soft-tissue]
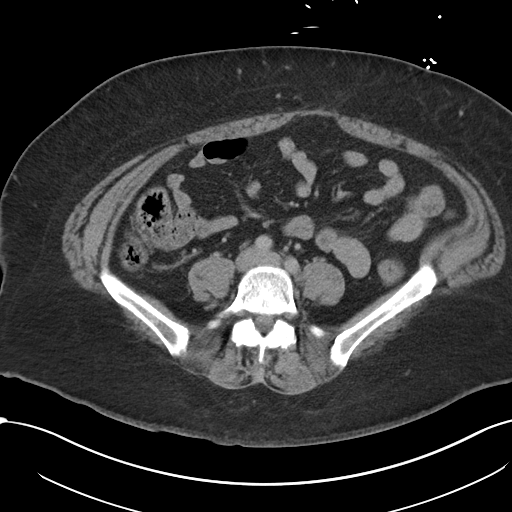
[im 58/109  soft-tissue]
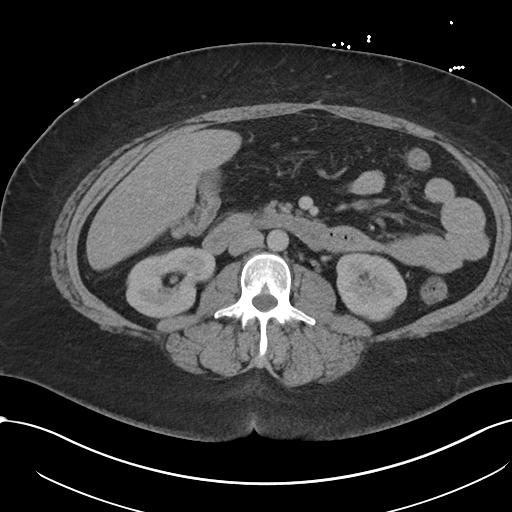
[im 65/109  soft-tissue]
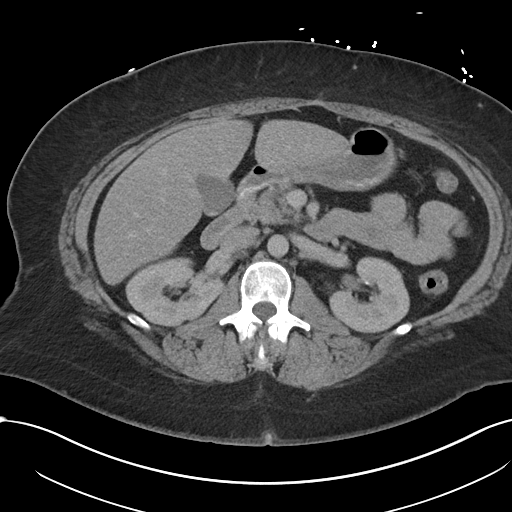
[im 73/109  soft-tissue]
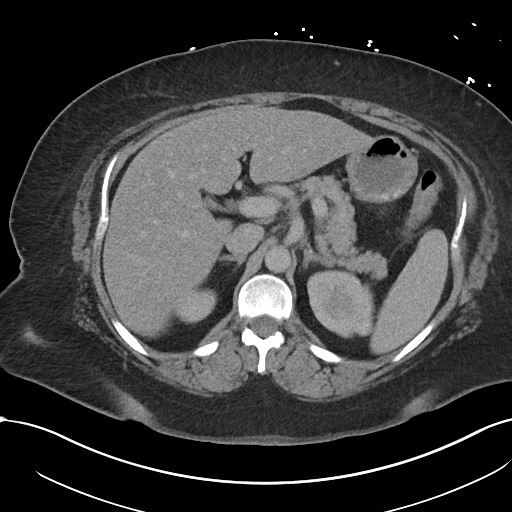
[im 73/109  bone]
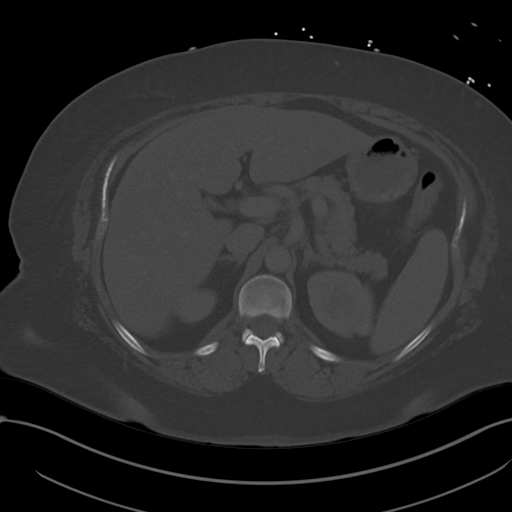
[im 80/109  soft-tissue]
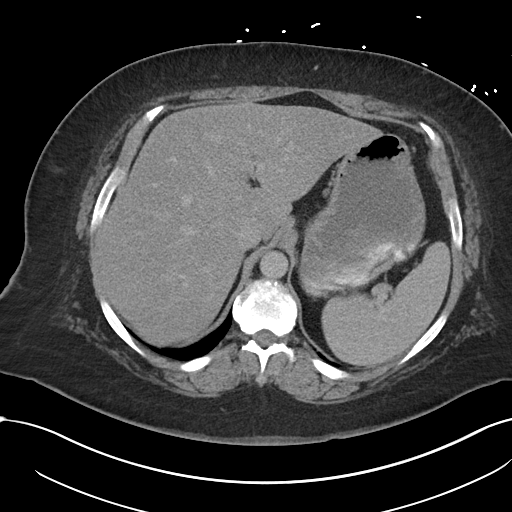
[im 87/109  soft-tissue]
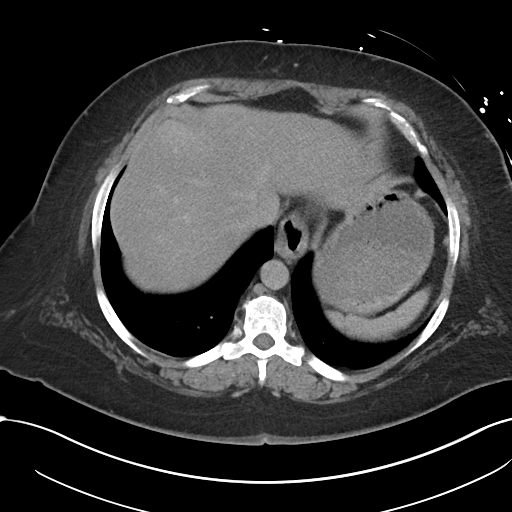
[im 94/109  soft-tissue]
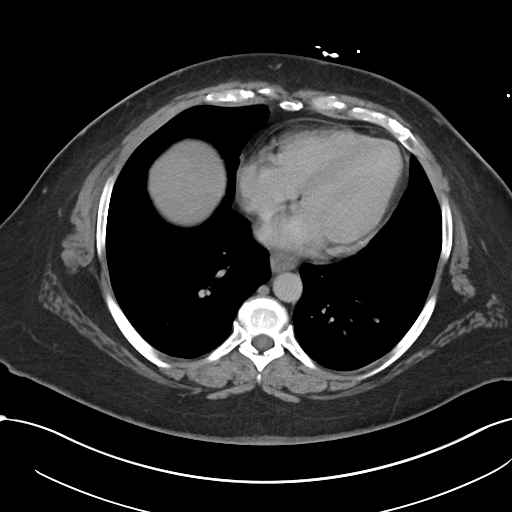
[im 101/109  soft-tissue]
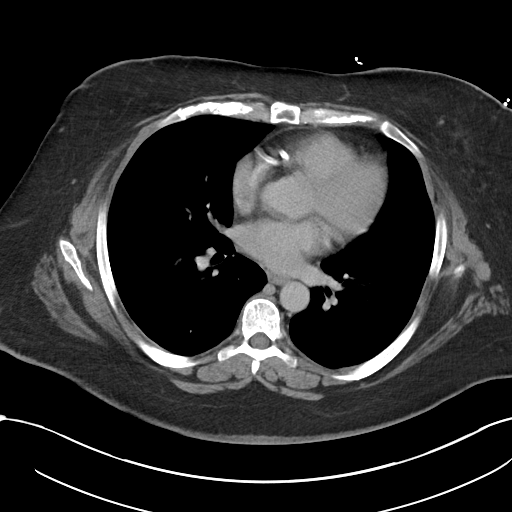

[Series 5: abdomen 3.0 mpr cor · coronal · 0.90mm/px · 3 of 110 slices shown]
[im 37/110  soft-tissue]
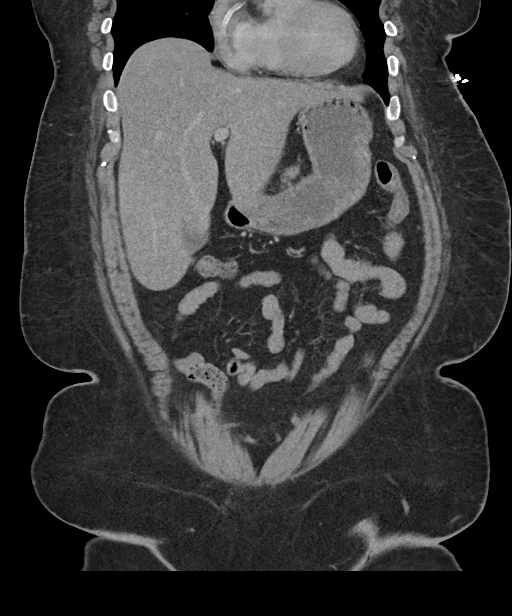
[im 49/110  soft-tissue]
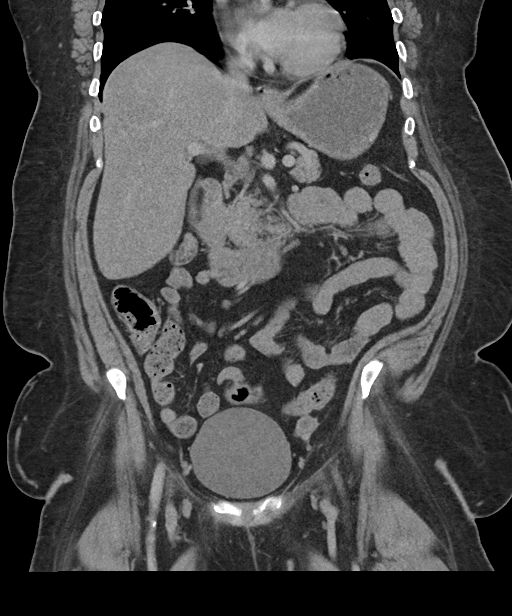
[im 61/110  soft-tissue]
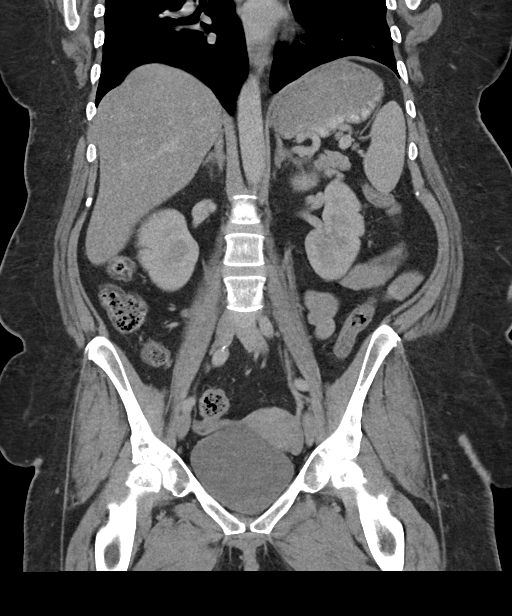

[16 of 46 positions shown; findings below may reference images not displayed]

RADIATION DOSE REDUCTION: This exam was performed according to the
departmental dose-optimization program which includes automated
exposure control, adjustment of the mA and/or kV according to
patient size and/or use of iterative reconstruction technique.

CONTRAST:  100mL OMNIPAQUE IOHEXOL 300 MG/ML  SOLN
FINDINGS: Lower chest: Dependent atelectasis in the lung bases, left greater
than right.

Hepatobiliary: The liver shows diffusely decreased attenuation
suggesting fat deposition. Layering tiny calcified gallstones
evident. No intrahepatic or extrahepatic biliary dilation.

Pancreas: No focal mass lesion. No dilatation of the main duct. No
intraparenchymal cyst. No peripancreatic edema.

Spleen: No splenomegaly. No focal mass lesion.

Adrenals/Urinary Tract: No adrenal nodule or mass. Kidneys
unremarkable. No evidence for hydroureter. The urinary bladder
appears normal for the degree of distention.

Stomach/Bowel: Stomach is unremarkable. No gastric wall thickening.
No evidence of outlet obstruction. Duodenum is normally positioned
as is the ligament of Treitz. No small bowel wall thickening. No
small bowel dilatation. The terminal ileum is normal. The appendix
is normal. Colon is diffusely decompressed.

Vascular/Lymphatic: There is mild atherosclerotic calcification of
the abdominal aorta without aneurysm. There is no gastrohepatic or
hepatoduodenal ligament lymphadenopathy. No retroperitoneal or
mesenteric lymphadenopathy. No pelvic sidewall lymphadenopathy.

Reproductive: The uterus is unremarkable.  There is no adnexal mass.

Other: No intraperitoneal free fluid.

Musculoskeletal: No worrisome lytic or sclerotic osseous
abnormality. Degenerative changes noted lower lumbar spine.
IMPRESSION: 1. No acute findings in the abdomen or pelvis. Specifically, no
findings to explain the patient's history of nausea and vomiting.
2. Cholelithiasis.
3. Hepatic steatosis.
4. Aortic Atherosclerosis ([id number]).

## 2022-10-11 ENCOUNTER — Other Ambulatory Visit: Payer: Self-pay

## 2022-10-11 ENCOUNTER — Emergency Department
Admission: EM | Admit: 2022-10-11 | Discharge: 2022-10-11 | Disposition: A | Discharge status: Home / Self Care | Payer: Medicaid Other | Attending: Emergency Medicine | Admitting: Emergency Medicine

## 2022-10-11 DIAGNOSIS — I2510 Atherosclerotic heart disease of native coronary artery without angina pectoris: Secondary | ICD-10-CM | POA: Insufficient documentation

## 2022-10-11 DIAGNOSIS — R1084 Generalized abdominal pain: Secondary | ICD-10-CM | POA: Diagnosis present

## 2022-10-11 DIAGNOSIS — R112 Nausea with vomiting, unspecified: Secondary | ICD-10-CM | POA: Diagnosis not present

## 2022-10-11 DIAGNOSIS — E119 Type 2 diabetes mellitus without complications: Secondary | ICD-10-CM | POA: Diagnosis not present

## 2022-10-11 LAB — LIPASE, BLOOD: Lipase: 28 U/L (ref 11–51)

## 2022-10-11 LAB — HCG, QUANTITATIVE, PREGNANCY: hCG, Beta Chain, Quant, S: 1 m[IU]/mL (ref <5)

## 2022-10-11 LAB — CBC
HCT: 43.6 % (ref 36.0–46.0)
Hemoglobin: 14.1 g/dL (ref 12.0–15.0)
MCH: 26.8 pg (ref 26.0–34.0)
MCHC: 32.3 g/dL (ref 30.0–36.0)
MCV: 82.7 fL (ref 80.0–100.0)
Platelets: 413 10*3/uL — ABNORMAL HIGH (ref 150–400)
RBC: 5.27 MIL/uL — ABNORMAL HIGH (ref 3.87–5.11)
RDW: 14.6 % (ref 11.5–15.5)
WBC: 15.3 10*3/uL — ABNORMAL HIGH (ref 4.0–10.5)
nRBC: 0 % (ref 0.0–0.2)

## 2022-10-11 LAB — COMPREHENSIVE METABOLIC PANEL
ALT: 34 U/L (ref 0–44)
AST: 33 U/L (ref 15–41)
Albumin: 4.2 g/dL (ref 3.5–5.0)
Alkaline Phosphatase: 87 U/L (ref 38–126)
Anion gap: 13 (ref 5–15)
BUN: 16 mg/dL (ref 6–20)
CO2: 19 mmol/L — ABNORMAL LOW (ref 22–32)
Calcium: 9.2 mg/dL (ref 8.9–10.3)
Chloride: 100 mmol/L (ref 98–111)
Creatinine, Ser: 0.69 mg/dL (ref 0.44–1.00)
GFR, Estimated: >60 mL/min (ref >60)
Glucose, Bld: 390 mg/dL — ABNORMAL HIGH (ref 70–99)
Potassium: 3.9 mmol/L (ref 3.5–5.1)
Sodium: 132 mmol/L — ABNORMAL LOW (ref 135–145)
Total Bilirubin: 0.8 mg/dL (ref 0.3–1.2)
Total Protein: 7.9 g/dL (ref 6.5–8.1)

## 2022-10-11 MED ORDER — SODIUM CHLORIDE 0.9 % IV BOLUS
1000.0000 mL | Freq: Once | INTRAVENOUS | Status: AC
  Administered 2022-10-11: 1000 mL via INTRAVENOUS

## 2022-10-11 MED ORDER — DROPERIDOL 2.5 MG/ML IJ SOLN
5.0000 mg | Freq: Once | INTRAMUSCULAR | Status: AC
  Administered 2022-10-11: 5 mg via INTRAVENOUS
  Filled 2022-10-11: qty 2

## 2022-10-11 MED ORDER — CAPSAICIN 0.025 % EX CREA
TOPICAL_CREAM | Freq: Once | CUTANEOUS | Status: AC
  Filled 2022-10-11: qty 60

## 2022-10-11 NOTE — ED Notes (Signed)
Pt yelling and screaming in subwait. Charge RN to speak with pt and visitor. Visitor inquiring as to how much longer pt would have to wait. Pts visitor was informed we cannot give a wait time and that we are working to see pts as fast as we can. Pt visitor upset that we are not giving pt pain medication while she is waiting and they were informed this would be ordered at discretion of the MD who sees the pt. Pts visitor states that that is not how it is done at [facility] and that is where they should have went. Pt visitor informed they are free to seek care wherever they feel is best. Pt become upset telling this RN to "get the fuck away". Security present during interaction.

## 2022-10-11 NOTE — Discharge Instructions (Signed)
Using marijuana and all marijuana products.  This will help with these episodes.  It will take 6 months to 1 year after stopping before you will notice significant changes to these episodes of abdominal pain nausea and vomiting.  Continue taking your diabetes medications because your blood sugar remains high.  Thank you for choosing [place] for your health care today!  Please see your primary doctor this week for a follow up appointment.   Sometimes, in the early stages of certain disease courses it is difficult to detect in the emergency department evaluation -- so, it is important that you continue to monitor your symptoms and call your doctor right away or return to the emergency department if you develop any new or worsening symptoms.  Please go to the following website to schedule new (and existing) patient appointments:   [email or web address]  If you do not have a primary doctor try calling the following clinics to establish care:  If you have insurance:  [facility] [phone] [place] [address]   [facility]  [phone] [place] [place] [address]   If you do not have insurance:  [facility]  [phone] [address], [place] [address]   The following is another list of primary care offices in the area who are accepting new patients at this time.  Please reach out to one of them directly and let them know you would like to schedule an appointment to follow up on an Emergency Department visit, and/or to establish a new primary care provider (PCP).  There are likely other primary care clinics in the are who are accepting new patients, but this is an excellent place to start:  [place] physician: Dr Lavon Paganini [address] [place], [place] [address] [phone]  [facility] Lead Physician: Dr Steele Sizer [address], [place], [place] [address] [phone]  [place] Physician: Dr Park Liter [address] [place], [place] [address] [phone]  [facility] Lead Physician: Dr Dewaine Oats [place], [place], [place] [address] [phone]  [facility] Physician: Dr Halina Maidens [address] [place], [place] [address] [phone]   It was my pleasure to care for you today.   Hoover Brunette Jacelyn Grip, MD

## 2022-10-11 NOTE — ED Triage Notes (Addendum)
Pt to ED via POV from home with friend. Pt dry heaving and throwing herself back and forth in the wheelchair. Pt is yelling ands creaming. Pt's friend brought her in and is answering most questions for her. Pt reports hx gastroparesis.

## 2022-10-11 NOTE — ED Notes (Signed)
IV placed and pt placed in subwait at this time. Scientist, water quality made aware.

## 2022-10-11 NOTE — ED Provider Notes (Addendum)
[facility] Provider Note    Event Date/Time   First MD Initiated Contact with Patient 10/11/22 1941     (approximate)   History   Abdominal Pain   HPI  Karen Ruiz is a 42 y.o. female   Past medical history of poorly controlled type II diabetic, gastroparesis, cannabinoid hyperemesis syndrome, BMI, migraines, CAD who presents to the emergency department with abdominal cramping nausea vomiting diarrhea for the last 2 days.  She has had multiple episodes of this through the years that she attributes to her gastroparesis.  She uses marijuana.  No gi Bleeding.  No chest pain.  No fever.  No GU symptoms.  History was obtained via the patient.  Her friend is at bedside as an independent historian corroborates information above. I reviewed medical history including an emergency department visit dated 07/29/2022 where she responded to Haldol with similar presentation.     Physical Exam   Triage Vital Signs: ED Triage Vitals  Enc Vitals Group     BP 10/11/22 1534 (!) 151/129     Pulse Rate 10/11/22 1534 73     Resp 10/11/22 1534 18     Temp 10/11/22 1534 98.1 F (36.7 C)     Temp src --      SpO2 10/11/22 1534 93 %     Weight --      Height --      Head Circumference --      Peak Flow --      Pain Score 10/11/22 1542 10     Pain Loc --      Pain Edu? --      Excl. in [place]? --     Most recent vital signs: Vitals:   10/11/22 2119 10/11/22 2130  BP: 103/62 105/64  Pulse: 95 97  Resp: 20 (!) 23  Temp:    SpO2: 95%     General: Awake, alert speaking and writhing around the stretcher CV:  Good peripheral perfusion.  Resp:  Normal effort.  Abd:  No distention.  Soft nontender.  No rigidity.   ED Results / Procedures / Treatments   Labs (all labs ordered are listed, but only abnormal results are displayed) Labs Reviewed  COMPREHENSIVE METABOLIC PANEL - Abnormal; Notable for the following components:      Result Value   Sodium 132  (*)    CO2 19 (*)    Glucose, Bld 390 (*)    All other components within normal limits  CBC - Abnormal; Notable for the following components:   WBC 15.3 (*)    RBC 5.27 (*)    Platelets 413 (*)    All other components within normal limits  LIPASE, BLOOD  HCG, QUANTITATIVE, PREGNANCY  URINALYSIS, ROUTINE W REFLEX MICROSCOPIC  POC URINE PREG, ED     I reviewed labs and they are notable for white blood cell count is 15.3.  Glucose 390 without an anion gap elevation  EKG  ED ECG REPORT I, Lucillie Garfinkel, the attending physician, personally viewed and interpreted this ECG.   Date: 10/11/2022  EKG Time: 1627  Rate: 78  Rhythm: normal sinus rhythm  Axis: nl  Intervals:short pr  ST&T Change: Acute ischemic changes.  QTc is 449    PROCEDURES:  Critical Care performed: No  Procedures   MEDICATIONS ORDERED IN ED: Medications  capsaicin (ZOSTRIX) 0.025 % cream (has no administration in time range)  droperidol (INAPSINE) 2.5 MG/ML injection 5 mg (5 mg Intravenous  Given 10/11/22 2018)  sodium chloride 0.9 % bolus 1,000 mL (1,000 mLs Intravenous New Bag/Given 10/11/22 2118)    IMPRESSION / MDM / ASSESSMENT AND PLAN / ED COURSE  I reviewed the triage vital signs and the nursing notes.                              Differential diagnosis includes, but is not limited to, gastroparesis, hyperemesis syndrome cannabinoid, DKA, electrolyte disturbance, intra-abdominal infection or obstruction, infection urinary   MDM: This is most likely gastroparesis or cannabinoid hyperemesis.  She states that it gets better with hot showers.  Responded to Haldol in the past.  States similar to previous presentations.  Benign abdominal exam as above less likely surgical abdomen.  No DKA on labs.  Treat with droperidol, with very good response.  Will continue to monitor and if continues to do well, will discharge home.   Much improved.  Comfortable and sleeping.  Hemodynamics appropriate and reassuring  after some fluids.  Discharge.  Patient's presentation is most consistent with acute presentation with potential threat to life or bodily function.       FINAL CLINICAL IMPRESSION(S) / ED DIAGNOSES   Final diagnoses:  Generalized abdominal pain  Nausea and vomiting, unspecified vomiting type     Rx / DC Orders   ED Discharge Orders     None        Note:  This document was prepared using Dragon voice recognition software and may include unintentional dictation errors.    Lucillie Garfinkel, MD 10/11/22 2031    Lucillie Garfinkel, MD 10/11/22 2245

## 2022-10-11 NOTE — ED Notes (Signed)
Pt c/o of CP, EKG obtained by EDT

## 2022-12-17 ENCOUNTER — Ambulatory Visit
Admission: EM | Admit: 2022-12-17 | Discharge: 2022-12-17 | Disposition: A | Discharge status: Home / Self Care | Payer: Medicaid Other | Attending: Emergency Medicine | Admitting: Emergency Medicine

## 2022-12-17 DIAGNOSIS — B070 Plantar wart: Secondary | ICD-10-CM | POA: Diagnosis not present

## 2022-12-17 NOTE — ED Triage Notes (Signed)
Pt c/o spot bottom of LT big toe, pt states area not draining, warm to the touch. Pt states spot appeared on Saturday

## 2022-12-17 NOTE — Discharge Instructions (Signed)
Use a corn pad to provide pressure relief from the wart and decrease your pain.  Wear thick socks with supportive footwear to aid in pain relief.  I have referred you to Podiatry for further evaluation and management.

## 2022-12-17 NOTE — ED Provider Notes (Signed)
[facility]    CSN: [id number] Arrival date & time: 12/17/22  1807      History   Chief Complaint Chief Complaint  Patient presents with   Toe Pain    HPI Karen Ruiz is a 42 y.o. female.   HPI  42 year old female with a history of Barrett's esophagus, diabetes, gastroparesis, migraines, and obesity presents for evaluation of painful swollen area on the left big toe.  She states she noticed it 2 days ago on the plantar surface of her left big toe.  She states it is very tender to touch but denies fever and denies any drainage.  Past Medical History:  Diagnosis Date   Barrett esophagus    Diabetes mellitus without complication (HCC)    Gastroparesis    Migraines    MRSA (methicillin resistant staph aureus) culture positive    2009   Obesity     Patient Active Problem List   Diagnosis Date Noted   Type 2 diabetes mellitus with hyperlipidemia ([place])    Coronary artery disease involving native coronary artery of native heart without angina pectoris    Non-ST elevated myocardial infarction ([place]) 05/02/2021   NSTEMI (non-ST elevated myocardial infarction) ([place]) 05/02/2021   Acute gastritis 07/27/2019   Intractable nausea and vomiting 07/15/2019   Dependent edema 03/05/2019   Nummular eczematous dermatitis 03/05/2019   Tobacco abuse 09/06/2016   Diabetes type 2, uncontrolled 09/06/2016   Barrett esophagus 03/28/2012   Hypertension 01/10/2012   Eustachian tube dysfunction 07/06/2003    Past Surgical History:  Procedure Laterality Date   CARDIAC CATHETERIZATION     CESAREAN SECTION     CORONARY STENT INTERVENTION N/A 05/02/2021   Procedure: CORONARY STENT INTERVENTION;  Surgeon: Nelva Bush, MD;  Location: [place] [facility];  Service: Cardiovascular;  Laterality: N/A;   ESOPHAGOGASTRODUODENOSCOPY N/A 07/28/2019   Procedure: ESOPHAGOGASTRODUODENOSCOPY (EGD);  Surgeon: Lin Landsman, MD;  Location: [facility] ENDOSCOPY;  Service:  Gastroenterology;  Laterality: N/A;   INTRAVASCULAR ULTRASOUND/IVUS N/A 05/02/2021   Procedure: Intravascular Ultrasound/IVUS;  Surgeon: Nelva Bush, MD;  Location: [place] [facility];  Service: Cardiovascular;  Laterality: N/A;   LEFT HEART CATH AND CORONARY ANGIOGRAPHY N/A 05/02/2021   Procedure: LEFT HEART CATH AND CORONARY ANGIOGRAPHY;  Surgeon: Nelva Bush, MD;  Location: [place] [facility];  Service: Cardiovascular;  Laterality: N/A;    OB History   No obstetric history on file.      Home Medications    Prior to Admission medications   Medication Sig Start Date End Date Taking? Authorizing Provider  aspirin EC 81 MG EC tablet Take 1 tablet (81 mg total) by mouth daily. Swallow whole. 05/04/21   Max Sane, MD  atorvastatin (LIPITOR) 80 MG tablet Take 1 tablet (80 mg total) by mouth daily. 05/31/21 08/29/21  Marrianne Mood D, PA-C  escitalopram (LEXAPRO) 5 MG tablet Take by mouth daily. 06/12/21 09/10/21  [provider]  insulin aspart (NOVOLOG) 100 UNIT/ML injection Inject 0-9 Units into the skin 3 (three) times daily with meals. 07/16/19   Epifanio Lesches, MD  losartan (COZAAR) 25 MG tablet Take 1 tablet (25 mg total) by mouth daily. 05/31/21 08/29/21  Marrianne Mood D, PA-C  metoCLOPramide (REGLAN) 10 MG tablet Take 1 tablet (10 mg total) by mouth 2 (two) times daily as needed for up to 5 days for nausea. 07/29/22 08/03/22  Poggi, Eliezer Lofts E, PA-C  metoprolol succinate (TOPROL-XL) 100 MG 24 hr tablet Take 1 tablet (100 mg total) by mouth  daily. Take with or immediately following a meal. PLEASE SCHEDULE OFFICE VISIT FOR FURTHER REFILLS. THANK YOU! 11/20/21 02/18/22  End, Harrell Gave, MD  pantoprazole (PROTONIX) 40 MG tablet Take 1 tablet (40 mg total) by mouth daily. PLEASE SCHEDULE OFFICE VISIT FOR FURTHER REFILLS. THANK YOU! 11/20/21 02/18/22  End, Harrell Gave, MD  promethazine (PHENERGAN) 25 MG suppository Place 1 suppository (25 mg total) rectally every 8  (eight) hours as needed for refractory nausea / vomiting. 06/29/21   Duffy Bruce, MD    Family History Family History  Problem Relation Age of Onset   Diabetes Mother    Cancer Mother    Heart failure Mother    Hypertension Mother    Heart attack Father    Diabetes Father    Hypertension Father     Social History Social History   Tobacco Use   Smoking status: Some Days    Packs/day: .5    Types: Cigarettes   Smokeless tobacco: Never  Vaping Use   Vaping Use: Never used  Substance Use Topics   Alcohol use: Yes   Drug use: Yes    Types: Marijuana     Allergies   Quinolones   Review of Systems Review of Systems  Constitutional:  Negative for fever.  Skin:  Negative for color change.       White raised area on the bottom of the left big toe.     Physical Exam Triage Vital Signs ED Triage Vitals  Enc Vitals Group     BP      Pulse      Resp      Temp      Temp src      SpO2      Weight      Height      Head Circumference      Peak Flow      Pain Score      Pain Loc      Pain Edu?      Excl. in [place]?    No data found.  Updated Vital Signs BP (!) 151/95 (BP Location: Left Arm)   Pulse 96   Temp 98.9 F (37.2 C) (Oral)   Ht 5\' 6"  (1.676 m)   Wt 230 lb (104.3 kg)   LMP 11/29/2022 (Approximate)   SpO2 98%   BMI 37.12 kg/m   Visual Acuity Right Eye Distance:   Left Eye Distance:   Bilateral Distance:    Right Eye Near:   Left Eye Near:    Bilateral Near:     Physical Exam Vitals and nursing note reviewed.  Constitutional:      Appearance: Normal appearance. She is not ill-appearing.  Skin:    General: Skin is warm and dry.     Capillary Refill: Capillary refill takes less than 2 seconds.     Findings: Lesion present. No erythema.  Neurological:     General: No focal deficit present.     Mental Status: She is alert and oriented to person, place, and time.      UC Treatments / Results  Labs (all labs ordered are listed, but  only abnormal results are displayed) Labs Reviewed - No data to display  EKG   Radiology No results found.  Procedures Procedures (including critical care time)  Medications Ordered in UC Medications - No data to display  Initial Impression / Assessment and Plan / UC Course  I have reviewed the triage vital signs and the nursing notes.  Pertinent labs & imaging results that were available during my care of the patient were reviewed by me and considered in my medical decision making (see chart for details).   Patient is a pleasant, nontoxic-appearing 42 year old female here for evaluation of a lesion on the plantar surface of her left big toe.  As you can see in the image above there is a darker white area with a flesh-colored nodule in the center.  That nodule is hard to touch.  There is no induration or fluctuance.  It is also tender to touch.  I suspect that the patient has a plantar wart and that is causing her discomfort.  I have advised her that we do not freeze them off or bring them off in the urgent care so I will refer her to podiatry for further evaluation and management of the plantar wart.  She should wear a corn pad to provide cushion and distribute pressure around the central lesion which should aid in pain relief.  She should also wear thick socks and supportive shoes.  Final Clinical Impressions(s) / UC Diagnoses   Final diagnoses:  Plantar wart     Discharge Instructions      Use a corn pad to provide pressure relief from the wart and decrease your pain.  Wear thick socks with supportive footwear to aid in pain relief.  I have referred you to Podiatry for further evaluation and management.      ED Prescriptions   None    PDMP not reviewed this encounter.   Margarette Canada, NP 12/17/22 1920

## 2022-12-20 ENCOUNTER — Ambulatory Visit: Payer: Medicaid Other | Admitting: Podiatry

## 2023-01-10 ENCOUNTER — Ambulatory Visit (INDEPENDENT_AMBULATORY_CARE_PROVIDER_SITE_OTHER): Payer: Medicaid Other

## 2023-01-10 ENCOUNTER — Ambulatory Visit (INDEPENDENT_AMBULATORY_CARE_PROVIDER_SITE_OTHER): Payer: Medicaid Other | Admitting: Podiatry

## 2023-01-10 DIAGNOSIS — L03032 Cellulitis of left toe: Secondary | ICD-10-CM

## 2023-01-10 DIAGNOSIS — M21619 Bunion of unspecified foot: Secondary | ICD-10-CM

## 2023-01-10 DIAGNOSIS — L02612 Cutaneous abscess of left foot: Secondary | ICD-10-CM | POA: Diagnosis not present

## 2023-01-10 DIAGNOSIS — S92531A Displaced fracture of distal phalanx of right lesser toe(s), initial encounter for closed fracture: Secondary | ICD-10-CM

## 2023-01-10 DIAGNOSIS — M79671 Pain in right foot: Secondary | ICD-10-CM

## 2023-01-10 MED ORDER — DOXYCYCLINE HYCLATE 100 MG PO TABS: 100.0000 mg | ORAL_TABLET | Freq: Two times a day (BID) | ORAL | 1 refills | Status: DC

## 2023-01-10 NOTE — Progress Notes (Signed)
Subjective:   Patient ID: Karen Ruiz, female   DOB: 42 y.o.   MRN: [id number]   HPI Patient presents stating that she has developed some swelling on the bottom of her left big toe 3 weeks ago it seemed to go away for.  Now it is back again and she is not sure if she may have stepped on something and also has chronic bunion formation right and left foot becomes painful and did have trauma to the third digit right foot with fracture about a 5 months ago that still gets.  Patient smokes occasionally and tries to be active   Review of Systems  All other systems reviewed and are negative.       Objective:  Physical Exam Vitals and nursing note reviewed.  Constitutional:      Appearance: She is well-developed.  Pulmonary:     Effort: Pulmonary effort is normal.  Musculoskeletal:        General: Normal range of motion.  Skin:    General: Skin is warm.  Neurological:     Mental Status: She is alert.     Neurovascular status intact muscle strength found to be adequate range of motion adequate with a small area of inflammation pain plantar aspect hallux left with swelling localized no proximal edema erythema or drainage was noted with patient found to have swelling of the distal to phalangeal joint digit 3 right and significant structural bunion deformity bilateral that gets painful.  Patient was noted to have good digital perfusion well-oriented     Assessment:  Possibility for small abscess plantar aspect left hallux possibility fracture third toe right bunion deformity bilateral     Plan:  H&P reviewed all conditions sterile opening of the plantar third digit left of approximate 5 mm and I did see a very small amount of pus which I cleaned out and then flushed.  I placed sterile dressing on I instructed on elevation and soaks and I placed on doxycycline twice daily given strict instructions of any further redness or pathology occurring to let us know immediately.  Discussed  bunion and toe and it is possible surgical intervention will be necessary but first I want to make sure this left toe gets better and we will see back again in the next few weeks with all questions answered today  X-rays indicate significant structural bunion deformity bilateral and fracture of the base of the distal phalanx third digit right which is causing her to have issues

## 2023-01-28 ENCOUNTER — Other Ambulatory Visit: Payer: Self-pay | Admitting: Podiatry

## 2023-01-28 DIAGNOSIS — S92531A Displaced fracture of distal phalanx of right lesser toe(s), initial encounter for closed fracture: Secondary | ICD-10-CM

## 2023-01-28 DIAGNOSIS — M21619 Bunion of unspecified foot: Secondary | ICD-10-CM

## 2023-01-28 DIAGNOSIS — M79671 Pain in right foot: Secondary | ICD-10-CM

## 2023-01-28 DIAGNOSIS — L03032 Cellulitis of left toe: Secondary | ICD-10-CM

## 2023-02-07 ENCOUNTER — Ambulatory Visit (INDEPENDENT_AMBULATORY_CARE_PROVIDER_SITE_OTHER): Payer: Medicaid Other

## 2023-02-07 ENCOUNTER — Ambulatory Visit (INDEPENDENT_AMBULATORY_CARE_PROVIDER_SITE_OTHER): Payer: Medicaid Other | Admitting: Podiatry

## 2023-02-07 DIAGNOSIS — L03032 Cellulitis of left toe: Secondary | ICD-10-CM

## 2023-02-07 DIAGNOSIS — S92531A Displaced fracture of distal phalanx of right lesser toe(s), initial encounter for closed fracture: Secondary | ICD-10-CM

## 2023-02-07 DIAGNOSIS — L02612 Cutaneous abscess of left foot: Secondary | ICD-10-CM

## 2023-02-07 NOTE — Progress Notes (Signed)
Subjective:   Patient ID: Karen Ruiz, female   DOB: 42 y.o.   MRN: [id number]   HPI Patient states the third digit is improved right and the small infection she had left is better and she still has the chronic bunion deformity that she would like fixed right over left that is painful and she is tried wider shoes soaks and padding therapy   ROS      Objective:  Physical Exam  Neurovascular status intact with reduced edema third digit right and no pathology left big toe no drainage or indications of pathology.  Large bunion deformity right pathology left big toe no drainage or indications of pathology.  Large bunion deformity right and left right worse with redness and pain around the joint surface     Assessment:  Structural HAV deformity right with redness and pain around the big toe joint painful with well-healed other conditions currently with structural deformity left also noted     Plan:  H&P x-ray reviewed discussed treatment options recommended correction and she wants this done.  I allowed her to read consent form going over alternative treatments complications associated with surgical correction she understands no guarantee she understands total recovery can take 6 months to 1 year and after review of consent form she signs.  All instructions discussed and patient does have surgical packet with her and is scheduled for procedure end of the month

## 2023-02-08 ENCOUNTER — Emergency Department
Admission: EM | Admit: 2023-02-08 | Discharge: 2023-02-08 | Discharge status: Against Medical Advice | Payer: Medicaid Other | Attending: Student in an Organized Health Care Education/Training Program | Admitting: Student in an Organized Health Care Education/Training Program

## 2023-02-08 ENCOUNTER — Emergency Department: Payer: Medicaid Other

## 2023-02-08 DIAGNOSIS — R079 Chest pain, unspecified: Secondary | ICD-10-CM | POA: Insufficient documentation

## 2023-02-08 DIAGNOSIS — Z5321 Procedure and treatment not carried out due to patient leaving prior to being seen by health care provider: Secondary | ICD-10-CM | POA: Insufficient documentation

## 2023-02-08 LAB — CBC
HCT: 45.2 % (ref 36.0–46.0)
Hemoglobin: 14.4 g/dL (ref 12.0–15.0)
MCH: 27.1 pg (ref 26.0–34.0)
MCHC: 31.9 g/dL (ref 30.0–36.0)
MCV: 85 fL (ref 80.0–100.0)
Platelets: 295 10*3/uL (ref 150–400)
RBC: 5.32 MIL/uL — ABNORMAL HIGH (ref 3.87–5.11)
RDW: 14.1 % (ref 11.5–15.5)
WBC: 10.5 10*3/uL (ref 4.0–10.5)
nRBC: 0 % (ref 0.0–0.2)

## 2023-02-08 LAB — BASIC METABOLIC PANEL
Anion gap: 8 (ref 5–15)
BUN: 16 mg/dL (ref 6–20)
CO2: 24 mmol/L (ref 22–32)
Calcium: 8.5 mg/dL — ABNORMAL LOW (ref 8.9–10.3)
Chloride: 104 mmol/L (ref 98–111)
Creatinine, Ser: 0.58 mg/dL (ref 0.44–1.00)
GFR, Estimated: >60 mL/min (ref >60)
Glucose, Bld: 339 mg/dL — ABNORMAL HIGH (ref 70–99)
Potassium: 3.7 mmol/L (ref 3.5–5.1)
Sodium: 136 mmol/L (ref 135–145)

## 2023-02-08 LAB — TROPONIN I (HIGH SENSITIVITY): Troponin I (High Sensitivity): 19 ng/L — ABNORMAL HIGH (ref <18)

## 2023-02-08 LAB — POC URINE PREG, ED: Preg Test, Ur: NEGATIVE

## 2023-02-08 NOTE — ED Triage Notes (Signed)
Pt sts that she has had a previous MI and that she is feeling the same way. Pt sts that she is having mid chest pain that radiates through to her back and down the left arm.

## 2023-02-08 NOTE — ED Notes (Signed)
Visitor came in looking for patient. He called patient and then let this RN know patient left.

## 2023-02-12 ENCOUNTER — Telehealth: Payer: Self-pay | Admitting: Emergency Medicine

## 2023-02-12 NOTE — Telephone Encounter (Signed)
Called patient due to left emergency department before provider exam to inquire about condition and follow up plans. No answer and no voicemail 

## 2023-02-19 ENCOUNTER — Emergency Department: Payer: Medicaid Other

## 2023-02-19 ENCOUNTER — Encounter: Payer: Self-pay | Admitting: Intensive Care

## 2023-02-19 ENCOUNTER — Other Ambulatory Visit: Payer: Self-pay

## 2023-02-19 ENCOUNTER — Observation Stay
Admission: EM | Admit: 2023-02-19 | Discharge: 2023-02-20 | Discharge status: Against Medical Advice | Payer: Medicaid Other | Attending: Internal Medicine | Admitting: Internal Medicine

## 2023-02-19 DIAGNOSIS — E119 Type 2 diabetes mellitus without complications: Secondary | ICD-10-CM | POA: Diagnosis not present

## 2023-02-19 DIAGNOSIS — Z7982 Long term (current) use of aspirin: Secondary | ICD-10-CM | POA: Diagnosis not present

## 2023-02-19 DIAGNOSIS — Z794 Long term (current) use of insulin: Secondary | ICD-10-CM | POA: Diagnosis not present

## 2023-02-19 DIAGNOSIS — I213 ST elevation (STEMI) myocardial infarction of unspecified site: Secondary | ICD-10-CM | POA: Diagnosis present

## 2023-02-19 DIAGNOSIS — I214 Non-ST elevation (NSTEMI) myocardial infarction: Principal | ICD-10-CM

## 2023-02-19 DIAGNOSIS — E1165 Type 2 diabetes mellitus with hyperglycemia: Secondary | ICD-10-CM | POA: Diagnosis not present

## 2023-02-19 DIAGNOSIS — Z79899 Other long term (current) drug therapy: Secondary | ICD-10-CM | POA: Diagnosis not present

## 2023-02-19 DIAGNOSIS — F17210 Nicotine dependence, cigarettes, uncomplicated: Secondary | ICD-10-CM | POA: Insufficient documentation

## 2023-02-19 DIAGNOSIS — I2510 Atherosclerotic heart disease of native coronary artery without angina pectoris: Secondary | ICD-10-CM | POA: Diagnosis not present

## 2023-02-19 DIAGNOSIS — R079 Chest pain, unspecified: Secondary | ICD-10-CM | POA: Diagnosis present

## 2023-02-19 DIAGNOSIS — I10 Essential (primary) hypertension: Secondary | ICD-10-CM | POA: Diagnosis not present

## 2023-02-19 LAB — CBC
HCT: 44 % (ref 36.0–46.0)
Hemoglobin: 14.3 g/dL (ref 12.0–15.0)
MCH: 27.2 pg (ref 26.0–34.0)
MCHC: 32.5 g/dL (ref 30.0–36.0)
MCV: 83.7 fL (ref 80.0–100.0)
Platelets: 301 10*3/uL (ref 150–400)
RBC: 5.26 MIL/uL — ABNORMAL HIGH (ref 3.87–5.11)
RDW: 14.3 % (ref 11.5–15.5)
WBC: 10.7 10*3/uL — ABNORMAL HIGH (ref 4.0–10.5)
nRBC: 0 % (ref 0.0–0.2)

## 2023-02-19 LAB — BASIC METABOLIC PANEL
Anion gap: 8 (ref 5–15)
BUN: 15 mg/dL (ref 6–20)
CO2: 24 mmol/L (ref 22–32)
Calcium: 8.7 mg/dL — ABNORMAL LOW (ref 8.9–10.3)
Chloride: 101 mmol/L (ref 98–111)
Creatinine, Ser: 0.73 mg/dL (ref 0.44–1.00)
GFR, Estimated: >60 mL/min (ref >60)
Glucose, Bld: 383 mg/dL — ABNORMAL HIGH (ref 70–99)
Potassium: 4.6 mmol/L (ref 3.5–5.1)
Sodium: 133 mmol/L — ABNORMAL LOW (ref 135–145)

## 2023-02-19 LAB — APTT: aPTT: 27 seconds (ref 24–36)

## 2023-02-19 LAB — TROPONIN I (HIGH SENSITIVITY)
Troponin I (High Sensitivity): 23 ng/L — ABNORMAL HIGH (ref <18)
Troponin I (High Sensitivity): 195 ng/L (ref <18)
Troponin I (High Sensitivity): 834 ng/L (ref <18)

## 2023-02-19 LAB — PROTIME-INR
INR: 1 (ref 0.8–1.2)
Prothrombin Time: 13.8 seconds (ref 11.4–15.2)

## 2023-02-19 LAB — CBG MONITORING, ED: Glucose-Capillary: 332 mg/dL — ABNORMAL HIGH (ref 70–99)

## 2023-02-19 LAB — BRAIN NATRIURETIC PEPTIDE: B Natriuretic Peptide: 13.5 pg/mL (ref 0.0–100.0)

## 2023-02-19 MED ORDER — LORAZEPAM 2 MG/ML IJ SOLN
1.0000 mg | Freq: Once | INTRAMUSCULAR | Status: AC
  Administered 2023-02-19: 1 mg via INTRAVENOUS
  Filled 2023-02-19: qty 1

## 2023-02-19 MED ORDER — INSULIN GLARGINE-YFGN 100 UNIT/ML ~~LOC~~ SOLN
10.0000 [IU] | Freq: Every day | SUBCUTANEOUS | Status: DC
  Administered 2023-02-19: 10 [IU] via SUBCUTANEOUS
  Filled 2023-02-19: qty 0.1

## 2023-02-19 MED ORDER — INSULIN ASPART 100 UNIT/ML IJ SOLN
0.0000 [IU] | Freq: Three times a day (TID) | INTRAMUSCULAR | Status: DC
  Administered 2023-02-19: 11 [IU] via SUBCUTANEOUS
  Filled 2023-02-19: qty 1

## 2023-02-19 MED ORDER — IOHEXOL 350 MG/ML SOLN
100.0000 mL | Freq: Once | INTRAVENOUS | Status: AC | PRN
  Administered 2023-02-19: 100 mL via INTRAVENOUS

## 2023-02-19 MED ORDER — ONDANSETRON HCL 4 MG/2ML IJ SOLN: 4.0000 mg | Freq: Four times a day (QID) | INTRAMUSCULAR | Status: DC | PRN

## 2023-02-19 MED ORDER — NITROGLYCERIN IN D5W 200-5 MCG/ML-% IV SOLN
0.0000 ug/min | INTRAVENOUS | Status: DC
  Administered 2023-02-19: 5 ug/min via INTRAVENOUS
  Filled 2023-02-19: qty 250

## 2023-02-19 MED ORDER — ASPIRIN 81 MG PO TBEC: 81.0000 mg | DELAYED_RELEASE_TABLET | Freq: Every day | ORAL | Status: DC

## 2023-02-19 MED ORDER — HYDROMORPHONE HCL 1 MG/ML IJ SOLN: 1.0000 mg | INTRAMUSCULAR | Status: DC | PRN

## 2023-02-19 MED ORDER — HEPARIN BOLUS VIA INFUSION
4000.0000 [IU] | Freq: Once | INTRAVENOUS | Status: AC
  Administered 2023-02-19: 4000 [IU] via INTRAVENOUS
  Filled 2023-02-19: qty 4000

## 2023-02-19 MED ORDER — HALOPERIDOL LACTATE 5 MG/ML IJ SOLN
2.0000 mg | Freq: Four times a day (QID) | INTRAMUSCULAR | Status: DC | PRN
  Administered 2023-02-19: 2 mg via INTRAVENOUS
  Filled 2023-02-19: qty 1

## 2023-02-19 MED ORDER — HYDROMORPHONE HCL 1 MG/ML IJ SOLN: 0.5000 mg | INTRAMUSCULAR | Status: DC | PRN

## 2023-02-19 MED ORDER — HEPARIN (PORCINE) 25000 UT/250ML-% IV SOLN
1400.0000 [IU]/h | INTRAVENOUS | Status: DC
  Administered 2023-02-19: 1100 [IU]/h via INTRAVENOUS
  Filled 2023-02-19: qty 250

## 2023-02-19 MED ORDER — ACETAMINOPHEN 325 MG PO TABS: 650.0000 mg | ORAL_TABLET | ORAL | Status: DC | PRN

## 2023-02-19 MED ORDER — OXYCODONE-ACETAMINOPHEN 5-325 MG PO TABS
1.0000 | ORAL_TABLET | ORAL | Status: DC | PRN
  Administered 2023-02-19: 1 via ORAL
  Filled 2023-02-19: qty 1

## 2023-02-19 MED ORDER — SODIUM CHLORIDE 0.9 % IV BOLUS
1000.0000 mL | Freq: Once | INTRAVENOUS | Status: AC
  Administered 2023-02-19: 1000 mL via INTRAVENOUS

## 2023-02-19 MED ORDER — NITROGLYCERIN 0.4 MG SL SUBL
0.4000 mg | SUBLINGUAL_TABLET | Freq: Once | SUBLINGUAL | Status: AC
  Administered 2023-02-19: 0.4 mg via SUBLINGUAL
  Filled 2023-02-19: qty 1

## 2023-02-19 MED ORDER — ONDANSETRON HCL 4 MG/2ML IJ SOLN
4.0000 mg | Freq: Once | INTRAMUSCULAR | Status: AC
  Administered 2023-02-19: 4 mg via INTRAVENOUS
  Filled 2023-02-19: qty 2

## 2023-02-19 MED ORDER — MORPHINE SULFATE (PF) 4 MG/ML IV SOLN
4.0000 mg | Freq: Once | INTRAVENOUS | Status: AC
  Administered 2023-02-19: 4 mg via INTRAVENOUS
  Filled 2023-02-19: qty 1

## 2023-02-19 NOTE — ED Triage Notes (Signed)
Patient arrived by EMS from home for chest pain. History of MI in past. Reports some nausea.   Patient is yelling and rocking herself in wheelchair hysterically crying.

## 2023-02-19 NOTE — H&P (Signed)
History and Physical    Patient: Karen Ruiz ZOX:[id number] DOB: 06-14-1981 DOA: 02/19/2023 DOS: the patient was seen and examined on 02/19/2023 PCP: Dan Humphreys, MD  Patient coming from: Home  Chief Complaint:  Chief Complaint  Patient presents with   Chest Pain   HPI: Karen Ruiz is a 42 y.o. 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   History obtained from patient and from her husband at bedside due to patient's severe pain.  History is limited as patient unable to answer majority of questions due to severity of pain.  Mr. Baruch states that patient was getting her nails done at the nail salon when she had sudden onset chest pain at approximately 2:30 PM.  Patient states that the chest pain does radiate to her back intermittently in addition to her left arm.  Her husband noticed some increased breathing rate but denies noticing any shortness of breath.  Patient's husband states that her pain has never been this severe in the past when she has had coronary events.    ED Course:  On arrival to the ED, patient was hypertensive at 166/131 with heart rate of 87.  She was saturating at 98% on room air.  She was afebrile at 98.5.  Initial workup notable for WBC of 10.7, hemoglobin 14.3, sodium 133, glucose 383, creatinine 0.73 with GFR above 60.  Initial troponin elevated at 23 with increased to 195.  EKG was obtained with no evidence of ischemic changes.  CTA was obtained with no evidence of dissection.  Patient started on heparin and nitroglycerin infusions.  Cardiology consulted.  TRH contacted for admission.  Review of Systems: As mentioned in the history of present illness. All other systems reviewed and are negative.  Past Medical History:  Diagnosis Date   Barrett esophagus    Diabetes mellitus without complication (HCC)    Gastroparesis     Migraines    MRSA (methicillin resistant staph aureus) culture positive    2009   Obesity    Past Surgical History:  Procedure Laterality Date   CARDIAC CATHETERIZATION     CESAREAN SECTION     CORONARY STENT INTERVENTION N/A 05/02/2021   Procedure: CORONARY STENT INTERVENTION;  Surgeon: Yvonne Kendall, MD;  Location: [facility] INVASIVE CV LAB;  Service: Cardiovascular;  Laterality: N/A;   CORONARY ULTRASOUND/IVUS N/A 05/02/2021   Procedure: Intravascular Ultrasound/IVUS;  Surgeon: Yvonne Kendall, MD;  Location: [facility] INVASIVE CV LAB;  Service: Cardiovascular;  Laterality: N/A;   ESOPHAGOGASTRODUODENOSCOPY N/A 07/28/2019   Procedure: ESOPHAGOGASTRODUODENOSCOPY (EGD);  Surgeon: Toney Reil, MD;  Location: [facility] ENDOSCOPY;  Service: Gastroenterology;  Laterality: N/A;   LEFT HEART CATH AND CORONARY ANGIOGRAPHY N/A 05/02/2021   Procedure: LEFT HEART CATH AND CORONARY ANGIOGRAPHY;  Surgeon: Yvonne Kendall, MD;  Location: [facility] INVASIVE CV LAB;  Service: Cardiovascular;  Laterality: N/A;   Social History:  reports that she has been smoking cigarettes. She has been smoking an average of .5 packs per day. She has never used smokeless tobacco. She reports current alcohol use. She reports current drug use. Drug: Marijuana.  Allergies  Allergen Reactions   Quinolones Other (See Comments)    Family History  Problem Relation Age of Onset   Diabetes Mother    Cancer Mother    Heart failure Mother    Hypertension Mother    Heart attack Father  Diabetes Father    Hypertension Father     Prior to Admission medications   Medication Sig Start Date End Date Taking? Authorizing Provider  aspirin EC 81 MG EC tablet Take 1 tablet (81 mg total) by mouth daily. Swallow whole. 05/04/21   Delfino Lovett, MD  atorvastatin (LIPITOR) 80 MG tablet Take 1 tablet (80 mg total) by mouth daily. 05/31/21 08/29/21  Marisue Ivan D, PA-C  doxycycline (VIBRA-TABS) 100 MG tablet Take 1 tablet (100 mg total)  by mouth 2 (two) times daily. 01/10/23   Lenn Sink, DPM  escitalopram (LEXAPRO) 5 MG tablet Take by mouth daily. 06/12/21 09/10/21  [provider]  insulin aspart (NOVOLOG) 100 UNIT/ML injection Inject 0-9 Units into the skin 3 (three) times daily with meals. 07/16/19   Katha Hamming, MD  losartan (COZAAR) 25 MG tablet Take 1 tablet (25 mg total) by mouth daily. 05/31/21 08/29/21  Marisue Ivan D, PA-C  metoCLOPramide (REGLAN) 10 MG tablet Take 1 tablet (10 mg total) by mouth 2 (two) times daily as needed for up to 5 days for nausea. 07/29/22 08/03/22  Poggi, Eileen Stanford E, PA-C  metoprolol succinate (TOPROL-XL) 100 MG 24 hr tablet Take 1 tablet (100 mg total) by mouth daily. Take with or immediately following a meal. PLEASE SCHEDULE OFFICE VISIT FOR FURTHER REFILLS. THANK YOU! 11/20/21 02/18/22  End, Cristal Deer, MD  pantoprazole (PROTONIX) 40 MG tablet Take 1 tablet (40 mg total) by mouth daily. PLEASE SCHEDULE OFFICE VISIT FOR FURTHER REFILLS. THANK YOU! 11/20/21 02/18/22  End, Cristal Deer, MD  promethazine (PHENERGAN) 25 MG suppository Place 1 suppository (25 mg total) rectally every 8 (eight) hours as needed for refractory nausea / vomiting. 06/29/21   Shaune Pollack, MD    Physical Exam: Vitals:   02/19/23 1945 02/19/23 2000 02/19/23 2030 02/19/23 2105  BP: (!) 156/99 (!) 167/96 (!) 160/99 (!) 178/165  Pulse: 99 89 100 (!) 104  Resp: (!) 26  (!) 21 (!) 37  Temp:      TempSrc:      SpO2: 95% 92% 91% 93%  Weight:      Height:       Physical Exam Vitals and nursing note reviewed.  Constitutional:      General: She is in acute distress.     Appearance: She is diaphoretic.     Comments: When patient is awake, she is crying/screaming out in pain.  Will intermittently doze off.  HENT:     Head: Normocephalic and atraumatic.     Mouth/Throat:     Mouth: Mucous membranes are moist.     Pharynx: Oropharynx is clear.  Cardiovascular:     Rate and Rhythm: Regular rhythm.  Tachycardia present.  Pulmonary:     Effort: Pulmonary effort is normal. Tachypnea present.     Breath sounds: No decreased breath sounds, wheezing, rhonchi or rales.  Abdominal:     Palpations: Abdomen is soft.  Musculoskeletal:     Right lower leg: No edema.     Left lower leg: No edema.  Skin:    General: Skin is warm.  Neurological:     General: No focal deficit present.     Mental Status: She is alert.     Comments: Unable to assess if patient is oriented  Psychiatric:        Mood and Affect: Affect is tearful.        Behavior: Behavior is agitated.    Data Reviewed: CBC with WBC of 10.7, hemoglobin 14.3, platelets  of 301 BMP with sodium of 133, potassium 4.6, bicarb 24, glucose 383, creatinine 0.73 and GFR above 60 Troponin elevated at 23 with increased to 195 INR within normal limits at 1.0 PTT within normal limits at 27  EKG personally reviewed.  Significant motion artifact.  Sinus rhythm with rate of 95.  No ST or T wave changes consistent with acute ischemia.  Repeat EKG personally reviewed.  Sinus rhythm with rate of 102.  T waves inverted in lead III only.  Otherwise no ischemic appearing changes.  CT Angio Chest/Abd/Pel for Dissection W and/or W/WO  Result Date: 02/19/2023 CLINICAL DATA:  Acute aortic syndrome suspected EXAM: CT ANGIOGRAPHY CHEST, ABDOMEN AND PELVIS TECHNIQUE: Non-contrast CT of the chest was initially obtained. Multidetector CT imaging through the chest, abdomen and pelvis was performed using the standard protocol during bolus administration of intravenous contrast. Multiplanar reconstructed images and MIPs were obtained and reviewed to evaluate the vascular anatomy. RADIATION DOSE REDUCTION: This exam was performed according to the departmental dose-optimization program which includes automated exposure control, adjustment of the mA and/or kV according to patient size and/or use of iterative reconstruction technique. CONTRAST:  OMNIPAQUE IOHEXOL  350 MG/ML SOLN COMPARISON:  CT abdomen and pelvis 12/30/2021. CT angiogram chest 05/02/2021. FINDINGS: CTA CHEST FINDINGS Cardiovascular: Preferential opacification of the thoracic aorta. No evidence of thoracic aortic aneurysm or dissection. Normal heart size. No pericardial effusion. There are atherosclerotic calcifications of coronary arteries. Mediastinum/Nodes: No enlarged mediastinal, hilar, or axillary lymph nodes. Thyroid gland, trachea, and esophagus demonstrate no significant findings. There is a small hiatal hernia. Lungs/Pleura: Lungs are clear. No pleural effusion or pneumothorax. Musculoskeletal: No chest wall abnormality. No acute or significant osseous findings. Review of the MIP images confirms the above findings. CTA ABDOMEN AND PELVIS FINDINGS VASCULAR Aorta: Normal caliber aorta without aneurysm, dissection, vasculitis or significant stenosis. Celiac: There is some mild focal stenosis at the origin of the celiac axis. The remaining celiac artery is prominent in size measuring up to 1 cm without focal aneurysm. Branch vessels are within normal limits. SMA: Patent without evidence of aneurysm, dissection, vasculitis or significant stenosis. Renals: Both renal arteries are patent without evidence of aneurysm, dissection, vasculitis, fibromuscular dysplasia or significant stenosis. IMA: Patent without evidence of aneurysm, dissection, vasculitis or significant stenosis. Inflow: Patent without evidence of aneurysm, dissection, vasculitis or significant stenosis. Veins: No obvious venous abnormality within the limitations of this arterial phase study. Review of the MIP images confirms the above findings. NON-VASCULAR Hepatobiliary: No focal liver abnormality is seen. No gallstones, gallbladder wall thickening, or biliary dilatation. Pancreas: Unremarkable. No pancreatic ductal dilatation or surrounding inflammatory changes. Spleen: Normal in size without focal abnormality. Adrenals/Urinary Tract:  Adrenal glands are unremarkable. Kidneys are normal, without renal calculi, focal lesion, or hydronephrosis. Bladder is unremarkable. Stomach/Bowel: Stomach is within normal limits. Appendix appears normal. No evidence of bowel wall thickening, distention, or inflammatory changes. Lymphatic: No enlarged lymph nodes are seen. Reproductive: Uterus and bilateral adnexa are unremarkable. Other: No abdominal wall hernia or abnormality. No abdominopelvic ascites. Musculoskeletal: There are degenerative changes most pronounced at L4-L5. Review of the MIP images confirms the above findings. IMPRESSION: 1. No evidence for aortic dissection or aneurysm. 2. 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 The remaining celiac artery is prominent in size measuring up to 1 cm without focal aneurysm. 3. Coronary artery calcifications. 4. Small hiatal hernia. 5. No acute localizing process in the chest, abdomen or pelvis. Electronically Signed   By:  Darliss Cheney M.D.   On: 02/19/2023 20:39   DG Chest 2 View  Result Date: 02/19/2023 CLINICAL DATA:  Chest pain. EXAM: CHEST - 2 VIEW COMPARISON:  Feb 08, 2023. FINDINGS: The heart size and mediastinal contours are within normal limits. Both lungs are clear. The visualized skeletal structures are unremarkable. IMPRESSION: No active cardiopulmonary disease. Electronically Signed   By: Lupita Raider M.D.   On: 02/19/2023 15:18    Results are pending, will review when available.  Assessment and Plan:  * NSTEMI (non-ST elevated myocardial infarction) [facility]) Patient is presenting with severe sudden onset chest pain troponin elevation no ischemic appearing changes on EKG.  Per chart review, she has a history of CAD s/p DES in 2022.  Per pharmacy reconciliation, patient has not filled her metoprolol, losartan or atorvastatin in over a year.  I am concerned current presentation is secondary to uncontrolled type 2 diabetes, hypertension and medication noncompliance.  Given  prior RCA involvement, will hold off on starting a beta-blocker today.  - Cardiology consulted; appreciate their recommendations - Telemetry monitoring - Echocardiogram ordered - Continue to trend troponin till peak - Heparin per pharmacy dosing - Nitroglycerin infusion for chest pain management - S/p aspirin 324 mg via EMS - Start aspirin 81 g tomorrow - Start high intensity atorvastatin - A1c and lipid panel pending  Coronary artery disease involving native coronary artery of native heart without angina pectoris Patient has a history of severe two-vessel disease with DES to RCA in 2022.  Please see above for management of NSTEMI.  Type 2 diabetes mellitus (HCC) Unable to see any recent refills for home NovoLog.  Previous A1c of 10%.  - SSI, moderate - Semglee 10 units at bedtime - A1c pending  Hypertension History of uncontrolled hypertension with no recent refills of antihypertensives.   - Nitroglycerin infusion for management of hypertension and chest pain  Advance Care Planning:   Code Status: Full Code verified by patient's husband at bedside  Consults: Cardiology  Family Communication: Patient's husband updated at bedside  Severity of Illness: The appropriate patient status for this patient is OBSERVATION. Observation status is judged to be reasonable and necessary in order to provide the required intensity of service to ensure the patient's safety. The patient's presenting symptoms, physical exam findings, and initial radiographic and laboratory data in the context of their medical condition is felt to place them at decreased risk for further clinical deterioration. Furthermore, it is anticipated that the patient will be medically stable for discharge from the hospital within 2 midnights of admission.   Author: Verdene Lennert, MD 02/19/2023 9:32 PM  For on call review [email or web address].

## 2023-02-19 NOTE — Progress Notes (Signed)
ANTICOAGULATION CONSULT NOTE - Initial Consult  Pharmacy Consult for IV heparin Indication: chest pain/ACS  Allergies  Allergen Reactions   Quinolones Other (See Comments)    Patient Measurements: Height: 5\' 6"  (167.6 cm) Weight: 104 kg (229 lb 4.5 oz) IBW/kg (Calculated) : 59.3 Heparin Dosing Weight: 83.1 kg  Vital Signs: Temp: 98.5 F (36.9 C) (05/21 1453) Temp Source: Oral (05/21 1453) BP: 152/117 (05/21 1736) Pulse Rate: 94 (05/21 1736)  Labs: Recent Labs    02/19/23 1450 02/19/23 1709  HGB 14.3  --   HCT 44.0  --   PLT 301  --   CREATININE 0.73  --   TROPONINIHS 23* 195*    Estimated Creatinine Clearance: 109.4 mL/min (by C-G formula based on SCr of 0.73 mg/dL).   Medical History: Past Medical History:  Diagnosis Date   Barrett esophagus    Diabetes mellitus without complication (HCC)    Gastroparesis    Migraines    MRSA (methicillin resistant staph aureus) culture positive    2009   Obesity     Medications:  No anticoagulation prior to admission  Assessment: 42 year old patient presenting with chest pain. Troponins trending up 23 > 195.  Goal of Therapy:  Heparin level 0.3-0.7 units/ml Monitor platelets by anticoagulation protocol: Yes   Plan:  Give 4000 units bolus x 1 Start heparin infusion at 1100 units/hr Check anti-Xa level in 6 hours and daily while on heparin Continue to monitor H&H and platelets  Jaynie Bream 02/19/2023,6:35 PM

## 2023-02-19 NOTE — ED Notes (Signed)
Patient transported to X-ray 

## 2023-02-19 NOTE — ED Notes (Signed)
Pt educated to leave monitoring wires on and that the medications she is on require her to be monitored to accurately treat pt condition, pt understandable. Pt has removed monitoring x3. Monitors back on pt at this time and family at bedside.

## 2023-02-19 NOTE — Assessment & Plan Note (Addendum)
Patient is presenting with severe sudden onset chest pain troponin elevation no ischemic appearing changes on EKG.  Per chart review, she has a history of CAD s/p DES in 2022.  Per pharmacy reconciliation, patient has not filled her metoprolol, losartan or atorvastatin in over a year.  I am concerned current presentation is secondary to uncontrolled type 2 diabetes, hypertension and medication noncompliance.  Given prior RCA involvement, will hold off on starting a beta-blocker today.  - Cardiology consulted; appreciate their recommendations - Telemetry monitoring - Echocardiogram ordered - Continue to trend troponin till peak - Heparin per pharmacy dosing - Nitroglycerin infusion for chest pain management - S/p aspirin 324 mg via EMS - Start aspirin 81 g tomorrow - Start high intensity atorvastatin - A1c and lipid panel pending

## 2023-02-19 NOTE — ED Provider Notes (Signed)
[facility] Provider Note    Event Date/Time   First MD Initiated Contact with Patient 02/19/23 1624     (approximate)   History   Chest Pain   HPI  Karen Ruiz is a 42 y.o. female presenting to the ER for chest pain.  She has a history of severe two-vessel CAD s/p PCI to the RCA, prior NSTEMI, HTN, presenting to the emergency department for evaluation of chest pain.  Patient had been having ongoing chest pain for over a week.  She was seen in our ER 11 days ago and had blood work done which showed a minimally elevated troponin at 16, left before seeing a provider.  Today, she reports that her chest pain became significantly worse.  She is uncomfortable, writhing around in the bed.  On review of her chart, she has had multiple similar presentations for chest and abdominal pain, but with negative troponins during those visits.    Physical Exam   Triage Vital Signs: ED Triage Vitals  Enc Vitals Group     BP 02/19/23 1453 (!) 166/131     Pulse Rate 02/19/23 1453 87     Resp 02/19/23 1453 (!) 22     Temp 02/19/23 1453 98.5 F (36.9 C)     Temp Source 02/19/23 1445 Oral     SpO2 02/19/23 1453 98 %     Weight 02/19/23 1448 229 lb 4.5 oz (104 kg)     Height 02/19/23 1448 5\' 6"  (1.676 m)     Head Circumference --      Peak Flow --      Pain Score 02/19/23 1448 10     Pain Loc --      Pain Edu? --      Excl. in GC? --     Most recent vital signs: Vitals:   02/19/23 2300 02/19/23 2315  BP: 123/86 (!) 140/82  Pulse: (!) 107 (!) 103  Resp:    Temp:    SpO2: 90% 90%     General: Awake, interactive  CV:  Regular rate, good peripheral perfusion.  Resp:  Lungs clear, somewhat labored respirations in the setting of acute pain Abd:  Soft, nondistended, mild generalized tenderness Neuro:  Symmetric facial movement, fluid speech   ED Results / Procedures / Treatments   Labs (all labs ordered are listed, but only abnormal results are  displayed) Labs Reviewed  BASIC METABOLIC PANEL - Abnormal; Notable for the following components:      Result Value   Sodium 133 (*)    Glucose, Bld 383 (*)    Calcium 8.7 (*)    All other components within normal limits  CBC - Abnormal; Notable for the following components:   WBC 10.7 (*)    RBC 5.26 (*)    All other components within normal limits  CBG MONITORING, ED - Abnormal; Notable for the following components:   Glucose-Capillary 332 (*)    All other components within normal limits  TROPONIN I (HIGH SENSITIVITY) - Abnormal; Notable for the following components:   Troponin I (High Sensitivity) 23 (*)    All other components within normal limits  TROPONIN I (HIGH SENSITIVITY) - Abnormal; Notable for the following components:   Troponin I (High Sensitivity) 195 (*)    All other components within normal limits  TROPONIN I (HIGH SENSITIVITY) - Abnormal; Notable for the following components:   Troponin I (High Sensitivity) 834 (*)    All other components within  normal limits  APTT  PROTIME-INR  BRAIN NATRIURETIC PEPTIDE  HEPARIN LEVEL (UNFRACTIONATED)  CBC  HIV ANTIBODY (ROUTINE TESTING W REFLEX)  BASIC METABOLIC PANEL  LIPID PANEL  URINE DRUG SCREEN, QUALITATIVE ([facility] ONLY)  HEMOGLOBIN A1C     EKG EKG independently reviewed interpreted by myself (ER attending) demonstrates:  EKG demonstrates normal sinus rhythm with sinus arrhythmia rate 95, PR 128, QRS 100, QTc 469, artifact present, but no clear ST changes  RADIOLOGY Imaging independently reviewed and interpreted by myself demonstrates:  Chest x-Ansel Ferrall without pneumonia  PROCEDURES:  Critical Care performed: Yes, see critical care procedure note(s)  CRITICAL CARE Performed by: Trinna Post   Total critical care time:  35 minutes  Critical care time was exclusive of separately billable procedures and treating other patients.  Critical care was necessary to treat or prevent imminent or life-threatening  deterioration.  Critical care was time spent personally by me on the following activities: development of treatment plan with patient and/or surrogate as well as nursing, discussions with consultants, evaluation of patient's response to treatment, examination of patient, obtaining history from patient or surrogate, ordering and performing treatments and interventions, ordering and review of laboratory studies, ordering and review of radiographic studies, pulse oximetry and re-evaluation of patient's condition.   Procedures   MEDICATIONS ORDERED IN ED: Medications  nitroGLYCERIN 50 mg in dextrose 5 % 250 mL (0.2 mg/mL) infusion (20 mcg/min Intravenous Rate/Dose Change 02/19/23 2115)  heparin ADULT infusion 100 units/mL (25000 units/222mL) (1,100 Units/hr Intravenous New Bag/Given 02/19/23 2019)  aspirin EC tablet 81 mg (has no administration in time range)  acetaminophen (TYLENOL) tablet 650 mg (has no administration in time range)  ondansetron (ZOFRAN) injection 4 mg (has no administration in time range)  insulin aspart (novoLOG) injection 0-15 Units (11 Units Subcutaneous Given 02/19/23 2217)  insulin glargine-yfgn (SEMGLEE) injection 10 Units (10 Units Subcutaneous Given 02/19/23 2219)  haloperidol lactate (HALDOL) injection 2 mg (2 mg Intravenous Given 02/19/23 2215)  HYDROmorphone (DILAUDID) injection 0.5 mg (has no administration in time range)  ondansetron (ZOFRAN) injection 4 mg (4 mg Intravenous Given 02/19/23 1737)  morphine (PF) 4 MG/ML injection 4 mg (4 mg Intravenous Given 02/19/23 1737)  sodium chloride 0.9 % bolus 1,000 mL (0 mLs Intravenous Stopped 02/19/23 2055)  nitroGLYCERIN (NITROSTAT) SL tablet 0.4 mg (0.4 mg Sublingual Given 02/19/23 1737)  iohexol (OMNIPAQUE) 350 MG/ML injection 100 mL (100 mLs Intravenous Contrast Given 02/19/23 2012)  heparin bolus via infusion 4,000 Units (4,000 Units Intravenous Bolus from Bag 02/19/23 2019)  LORazepam (ATIVAN) injection 1 mg (1 mg Intravenous  Given 02/19/23 1946)     IMPRESSION / MDM / ASSESSMENT AND PLAN / ED COURSE  I reviewed the triage vital signs and the nursing notes.  Differential diagnosis includes, but is not limited to, ACS, pneumonia, pneumothorax, low risk PE and PERC negative  Patient's presentation is most consistent with acute presentation with potential threat to life or bodily function.  42 year old female presenting with chest pain.  Initial troponin here slightly elevated at 23.  Repeat did uptrend to 195.  Remains uncomfortable here.  Will start heparin and nitro drip.  Will reach to hospitalist team for admission.  Clinical Course as of 02/19/23 2352  Tue Feb 19, 2023  2054 Case reviewed with Dr. Huel Cote.  With ongoing chest pain, we did feel that a CTA to evaluate for dissection was appropriate.  This was obtained which fortunately did not show signs of dissection.  I did review the case with  Dr. Jens Som with cardiology as well.  Given overall mild troponin increase and new EKG changes, he did recommend admission on heparin drip, but did not feel that she needed intervention at this time.  Hospitalist team updated to evaluate the patient for anticipated admission. [NR]    Clinical Course User Index [NR] Trinna Post, MD     FINAL CLINICAL IMPRESSION(S) / ED DIAGNOSES   Final diagnoses:  NSTEMI (non-ST elevated myocardial infarction) [facility])     Rx / DC Orders   ED Discharge Orders     None        Note:  This document was prepared using Dragon voice recognition software and may include unintentional dictation errors.   Trinna Post, MD 02/19/23 2352

## 2023-02-19 NOTE — Assessment & Plan Note (Addendum)
History of uncontrolled hypertension with no recent refills of antihypertensives.   - Nitroglycerin infusion for management of hypertension and chest pain

## 2023-02-19 NOTE — Assessment & Plan Note (Signed)
Unable to see any recent refills for home NovoLog.  Previous A1c of 10%.  - SSI, moderate - Semglee 10 units at bedtime - A1c pending

## 2023-02-19 NOTE — Consult Note (Signed)
Initial Consultation Note   Patient: Karen Ruiz Heidinger ZOX:[id number] DOB: 06-17-1981 PCP: Dan Humphreys, MD DOA: 02/19/2023 DOS: the patient was seen and examined on 02/19/2023 Primary service: Trinna Post, MD  Consulted for possible admission for NSTEMI.  Per chart review, patient coming in with sudden onset chest pain with troponin elevation from 23 to 195.  Patient evaluated and endorsing severe chest pain that radiates to her back.  Given concern for possible dissection, recommending CTA.  If negative, will plan to admit to medicine service.  Full note to follow.  - CTA ordered  Author: Verdene Lennert, MD 02/19/2023 7:19 PM  For on call review [email or web address].

## 2023-02-19 NOTE — ED Triage Notes (Signed)
First Nurse Note;  Pt via EMS from home. Pt c/o chest pain started 30 mins ago. Pt has a hx of MI and it feels the same. Pt also endorses some nausea. Pt is A&Ox4 but hysterically crying.  324 ASA 1 spray of Nitro  4mg  of Zofran 193/133 96 HR  440 CBG  20 G R AC

## 2023-02-19 NOTE — Progress Notes (Addendum)
  Progress Note  Patient reassessed at bedside.  Per nurse, has required frequent education on importance of leaving telemetry wires on.  When she is checked on, cuffs and wires are usually removed.  On my evaluation, patient is repeatedly sitting up and then laying back down.  When I ask her to lay down and lie still for telemetry lead replacement, she does so, however sits up within a few seconds.  Patient denies any alcohol or drug use today.   Physical Exam Vitals and nursing note reviewed.  Neurological:     Comments: Moving all extremities independently in a grants gravity with no focal weakness Alert and oriented x 3  Psychiatric:     Comments: Patient seems significantly distraught with difficulty quantifying her pain and describing what is bothering her. Although somewhat cooperative, frequently requiring reorientation.Yelling frequently.     Assessment/plan:  # Acute Distress: Unclear etiology at this time.  Troponin continues to rise and chest pain severity can certainly be contributing, however she has had limited response with nitroglycerin and opioids. Potentially underlying psychiatric component  - May require sitter for frequent reorientation and safety - Haldol 2 mg every 6 hours as needed - UDS pending - May require psychiatry consultation - Continue to titrate nitroglycerin till improvement in chest pain - Dilaudid every 3 hours as needed for pain control  Author: Verdene Lennert, MD 02/19/2023 10:01 PM  For on call review [email or web address].

## 2023-02-19 NOTE — Assessment & Plan Note (Signed)
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  Please see above for management of NSTEMI.

## 2023-02-19 NOTE — ED Notes (Signed)
Pt in room hysterically crying loudly and rocking back and forth in the bed.

## 2023-02-20 DIAGNOSIS — I214 Non-ST elevation (NSTEMI) myocardial infarction: Principal | ICD-10-CM | POA: Diagnosis present

## 2023-02-20 DIAGNOSIS — I213 ST elevation (STEMI) myocardial infarction of unspecified site: Secondary | ICD-10-CM | POA: Insufficient documentation

## 2023-02-20 DIAGNOSIS — F1410 Cocaine abuse, uncomplicated: Secondary | ICD-10-CM | POA: Insufficient documentation

## 2023-02-20 LAB — HEPARIN LEVEL (UNFRACTIONATED): Heparin Unfractionated: 0.1 IU/mL — ABNORMAL LOW (ref 0.30–0.70)

## 2023-02-20 LAB — HEMOGLOBIN A1C
Hgb A1c MFr Bld: 12.2 % — ABNORMAL HIGH (ref 4.8–5.6)
Mean Plasma Glucose: 303.44 mg/dL

## 2023-02-20 LAB — TROPONIN I (HIGH SENSITIVITY): Troponin I (High Sensitivity): 990 ng/L (ref <18)

## 2023-02-20 MED ORDER — HEPARIN BOLUS VIA INFUSION
2500.0000 [IU] | Freq: Once | INTRAVENOUS | Status: AC
  Administered 2023-02-20: 2500 [IU] via INTRAVENOUS
  Filled 2023-02-20: qty 2500

## 2023-02-20 NOTE — ED Notes (Addendum)
Pt removed cardiac leads again after education, RN went into room and liquid all over the floor with paper towels on it and pt underwear to the side, husband asleep at bedside. Pt placed back on the monitor, pt denied other needs

## 2023-02-20 NOTE — ED Notes (Signed)
Rn went into pt room and pt had removed oxygen cord from wall, O2 blowing into the room and all monitors removed. RN replaced monitors and oxygen on pt.

## 2023-02-20 NOTE — ED Notes (Signed)
Writer, EDP and second RN in room. Pt requesting staff to remove her IV's because she is leaving. Staff attempted to speak but cut off by pts spouse. Pts spouse cussing at staff. EDP provided risk education with pt and interrupted by spouse who stated, "Look at me, not her. You talk to me." EDP continued to speak with pt and asked for verbal understanding of the risk she was taking leaving against medical advice. Pt stated, "I know what the fuck I am doing. I am going somewhere else. Anywhere but here." Security at door and pts spouse begins to cuss at security. Writer able to assist with removal of pts IV's as well as have other staff leave while providing assistance to pt. While assisting pt, pt expresses she was unhappy with her nurse due to her nurse accusing her of purposefully urinating onto floor and making her feel belittled. Pt reports she was not given a call bell and writer visualizes that call bell is not within reach or attached to pts bed. Writer attempts to encourage pt to stay due to her medical condition but still refuses. Astronomer for unsatisfactory care as well as provides information on how a formal complaint can be made. Writer assisted in providing answers to pt and spouses questions as well as encouraged to seek immediate medical care as soon as possible if not willing to stay. Pt and spouse given education on side effects of medications given during ED visit with verbal understanding expressed. AMA signed by patient and Clinical research associate wheeled pt out to vehicle. Provider made aware by pts assigned RN.

## 2023-02-20 NOTE — ED Notes (Addendum)
Writer called to room due to pt and pt spouse complaint with nursing care. Upon approach to room, pt and spouse heard swearing and yelling. Security called to stand by due to escalating behavior.

## 2023-02-20 NOTE — ED Notes (Addendum)
This RN went into pt room to adjust heparin levels, pt had remove all monitors. Pt began to get agitated as the RN explained what I was doing. RN asked pt to please leave monitors on and asked why she keeps removing them. Pt got very agitated stating she had to pee, RN educated her to use call light. Pt said she peed on the floor earlier, RN asked pt why, pt began yelling at this RN to take her IV out. Hospitalist and charge RN notified.

## 2023-02-20 NOTE — Progress Notes (Signed)
ANTICOAGULATION CONSULT NOTE - Initial Consult  Pharmacy Consult for IV heparin Indication: chest pain/ACS  Allergies  Allergen Reactions   Quinolones Other (See Comments)    Patient Measurements: Height: 5\' 6"  (167.6 cm) Weight: 104 kg (229 lb 4.5 oz) IBW/kg (Calculated) : 59.3 Heparin Dosing Weight: 83.1 kg  Vital Signs: Temp: 98.5 F (36.9 C) (05/21 1453) Temp Source: Oral (05/21 1453) BP: 103/73 (05/22 0205) Pulse Rate: 92 (05/22 0205)  Labs: Recent Labs    02/19/23 1450 02/19/23 1709 02/19/23 1927 02/19/23 2120 02/20/23 0108 02/20/23 0201  HGB 14.3  --   --   --   --   --   HCT 44.0  --   --   --   --   --   PLT 301  --   --   --   --   --   APTT  --   --  27  --   --   --   LABPROT  --   --  13.8  --   --   --   INR  --   --  1.0  --   --   --   HEPARINUNFRC  --   --   --   --   --  0.10*  CREATININE 0.73  --   --   --   --   --   TROPONINIHS 23* 195*  --  834* 990*  --      Estimated Creatinine Clearance: 109.4 mL/min (by C-G formula based on SCr of 0.73 mg/dL).   Medical History: Past Medical History:  Diagnosis Date   Barrett esophagus    Diabetes mellitus without complication (HCC)    Gastroparesis    Migraines    MRSA (methicillin resistant staph aureus) culture positive    2009   Obesity     Medications:  No anticoagulation prior to admission  Assessment: 42 year old patient presenting with chest pain. Troponins trending up 23 > 195.  Goal of Therapy:  Heparin level 0.3-0.7 units/ml Monitor platelets by anticoagulation protocol: Yes   Plan:  5/22:  HL @ 0201 = 0.10, SUBtherapeutic  - Will order heparin 2500 units IV X 1 bolus and increase drip rate to 1400 units/hr  - Will recheck HL 6 hrs after rate change  Shalan Neault D 02/20/2023,2:46 AM

## 2023-02-20 NOTE — ED Provider Notes (Signed)
Patient admitted to hospitalist service for the past 12 hours or so, boarding in the ED, for an NSTEMI on heparin drip.  Patient and her husband have been increasingly agitated for the past few hours for unknown reason.  Refusing to leave monitoring devices on, urinating on the floor, not using the call bell or attempting to go to the toilet, cussing at staff.  Husband and patient both are quite aggressive and blamed the "poor care" that they are receiving in the hospital for their behavior.  They no longer wish to be in this hospital and are demanding to leave.    I go to the bedside to try to diffuse the situation, but husband and patient continue to escalate and are unwilling to listen. Patient acknowledges the risk of worsening morbidity and mortality.  She acknowledges that she may be having heart attack and "I do not care if I die, as long as I am not here."  Husband is in agreement and he has no concerns that she is altered or does not have capacity to make this decision, again redirecting back to [place] and blaming the hospital for poor care.   Security was called, IV was taken out and patient was escorted out of the ED with her husband. We urged the patient and husband on multiple occasions to immediately seek care at another hospital.    Delton Prairie, MD 02/20/23 [phone]

## 2023-02-21 ENCOUNTER — Telehealth: Payer: Self-pay

## 2023-02-21 NOTE — Telephone Encounter (Signed)
DOS 03/12/2023  Karen Ruiz RT - [phone]  [facility]   Notification/prior authorization number [id number] Case status Closed Case status reason Case was managed and is now complete Primary care physician --- Advance notify date/time 02/12/2023, 1:20 PM CDT Admission notify date/time --- Coverage status  Overall coverage status Covered/Approved Coverage determination is reflected for the facility admission and isn't a guarantee of payment for ongoing services.  Procedure code [facility] Description [place] SPEC SURG Coverage status Covered/Approved Decision date 02/20/2023 Procedure code 91478 Description Correction, hallux valgus with bunionectomy, with sesamoidectomy when performed; with distal metatarsal osteotomy, any method Coverage status Covered/Approved Decision date 02/20/2023

## 2023-03-11 ENCOUNTER — Telehealth: Payer: Self-pay

## 2023-03-11 DIAGNOSIS — R079 Chest pain, unspecified: Secondary | ICD-10-CM | POA: Insufficient documentation

## 2023-03-11 NOTE — Telephone Encounter (Signed)
Received call from Aram Beecham at [facility] to cancel Karen Ruiz's surgery on 03/12/2023 with Dr. Charlsie Merles. She has been admitted to [facility].

## 2023-03-18 ENCOUNTER — Encounter: Payer: Medicaid Other | Admitting: Podiatry

## 2023-04-01 ENCOUNTER — Encounter: Payer: Medicaid Other | Admitting: Podiatry

## 2023-07-20 ENCOUNTER — Encounter
Admission: EM | Disposition: A | Discharge status: Home / Self Care | Payer: Self-pay | Source: Home / Self Care | Attending: Internal Medicine

## 2023-07-20 ENCOUNTER — Inpatient Hospital Stay
Admission: EM | Admit: 2023-07-20 | Discharge: 2023-07-22 | DRG: 322 | Disposition: A | Discharge status: Home / Self Care | Payer: Medicaid Other | Attending: Internal Medicine | Admitting: Internal Medicine

## 2023-07-20 ENCOUNTER — Emergency Department: Payer: Medicaid Other

## 2023-07-20 DIAGNOSIS — I2121 ST elevation (STEMI) myocardial infarction involving left circumflex coronary artery: Secondary | ICD-10-CM

## 2023-07-20 DIAGNOSIS — I214 Non-ST elevation (NSTEMI) myocardial infarction: Secondary | ICD-10-CM | POA: Insufficient documentation

## 2023-07-20 DIAGNOSIS — E669 Obesity, unspecified: Secondary | ICD-10-CM | POA: Diagnosis present

## 2023-07-20 DIAGNOSIS — E1143 Type 2 diabetes mellitus with diabetic autonomic (poly)neuropathy: Secondary | ICD-10-CM | POA: Diagnosis present

## 2023-07-20 DIAGNOSIS — Z955 Presence of coronary angioplasty implant and graft: Secondary | ICD-10-CM | POA: Diagnosis not present

## 2023-07-20 DIAGNOSIS — Z794 Long term (current) use of insulin: Secondary | ICD-10-CM | POA: Diagnosis not present

## 2023-07-20 DIAGNOSIS — Z8249 Family history of ischemic heart disease and other diseases of the circulatory system: Secondary | ICD-10-CM | POA: Diagnosis not present

## 2023-07-20 DIAGNOSIS — I252 Old myocardial infarction: Secondary | ICD-10-CM

## 2023-07-20 DIAGNOSIS — I5023 Acute on chronic systolic (congestive) heart failure: Secondary | ICD-10-CM | POA: Diagnosis not present

## 2023-07-20 DIAGNOSIS — I10 Essential (primary) hypertension: Secondary | ICD-10-CM | POA: Diagnosis present

## 2023-07-20 DIAGNOSIS — Z79899 Other long term (current) drug therapy: Secondary | ICD-10-CM | POA: Diagnosis not present

## 2023-07-20 DIAGNOSIS — Z7982 Long term (current) use of aspirin: Secondary | ICD-10-CM

## 2023-07-20 DIAGNOSIS — E1165 Type 2 diabetes mellitus with hyperglycemia: Secondary | ICD-10-CM | POA: Diagnosis present

## 2023-07-20 DIAGNOSIS — I213 ST elevation (STEMI) myocardial infarction of unspecified site: Principal | ICD-10-CM | POA: Diagnosis present

## 2023-07-20 DIAGNOSIS — F17210 Nicotine dependence, cigarettes, uncomplicated: Secondary | ICD-10-CM | POA: Diagnosis present

## 2023-07-20 DIAGNOSIS — G8929 Other chronic pain: Secondary | ICD-10-CM | POA: Diagnosis present

## 2023-07-20 DIAGNOSIS — Z8614 Personal history of Methicillin resistant Staphylococcus aureus infection: Secondary | ICD-10-CM | POA: Diagnosis not present

## 2023-07-20 DIAGNOSIS — I110 Hypertensive heart disease with heart failure: Secondary | ICD-10-CM | POA: Diagnosis present

## 2023-07-20 DIAGNOSIS — T82855A Stenosis of coronary artery stent, initial encounter: Secondary | ICD-10-CM | POA: Diagnosis present

## 2023-07-20 DIAGNOSIS — E871 Hypo-osmolality and hyponatremia: Secondary | ICD-10-CM | POA: Diagnosis present

## 2023-07-20 DIAGNOSIS — Y831 Surgical operation with implant of artificial internal device as the cause of abnormal reaction of the patient, or of later complication, without mention of misadventure at the time of the procedure: Secondary | ICD-10-CM | POA: Diagnosis present

## 2023-07-20 DIAGNOSIS — Z6838 Body mass index (BMI) 38.0-38.9, adult: Secondary | ICD-10-CM | POA: Diagnosis not present

## 2023-07-20 DIAGNOSIS — Z720 Tobacco use: Secondary | ICD-10-CM

## 2023-07-20 DIAGNOSIS — I5021 Acute systolic (congestive) heart failure: Secondary | ICD-10-CM | POA: Diagnosis not present

## 2023-07-20 DIAGNOSIS — I2510 Atherosclerotic heart disease of native coronary artery without angina pectoris: Secondary | ICD-10-CM | POA: Diagnosis present

## 2023-07-20 DIAGNOSIS — I2119 ST elevation (STEMI) myocardial infarction involving other coronary artery of inferior wall: Principal | ICD-10-CM | POA: Diagnosis present

## 2023-07-20 DIAGNOSIS — E785 Hyperlipidemia, unspecified: Secondary | ICD-10-CM | POA: Diagnosis present

## 2023-07-20 DIAGNOSIS — K3184 Gastroparesis: Secondary | ICD-10-CM | POA: Diagnosis present

## 2023-07-20 DIAGNOSIS — R079 Chest pain, unspecified: Secondary | ICD-10-CM | POA: Diagnosis present

## 2023-07-20 DIAGNOSIS — I5022 Chronic systolic (congestive) heart failure: Secondary | ICD-10-CM | POA: Diagnosis present

## 2023-07-20 DIAGNOSIS — Z833 Family history of diabetes mellitus: Secondary | ICD-10-CM | POA: Diagnosis not present

## 2023-07-20 DIAGNOSIS — Z7902 Long term (current) use of antithrombotics/antiplatelets: Secondary | ICD-10-CM

## 2023-07-20 DIAGNOSIS — D75839 Thrombocytosis, unspecified: Secondary | ICD-10-CM | POA: Diagnosis present

## 2023-07-20 HISTORY — PX: LEFT HEART CATH AND CORONARY ANGIOGRAPHY: CATH118249

## 2023-07-20 HISTORY — PX: CORONARY/GRAFT ACUTE MI REVASCULARIZATION: CATH118305

## 2023-07-20 LAB — CBC
HCT: 40.9 % (ref 36.0–46.0)
Hemoglobin: 12.3 g/dL (ref 12.0–15.0)
MCH: 22.3 pg — ABNORMAL LOW (ref 26.0–34.0)
MCHC: 30.1 g/dL (ref 30.0–36.0)
MCV: 74.2 fL — ABNORMAL LOW (ref 80.0–100.0)
Platelets: 479 10*3/uL — ABNORMAL HIGH (ref 150–400)
RBC: 5.51 MIL/uL — ABNORMAL HIGH (ref 3.87–5.11)
RDW: 16.5 % — ABNORMAL HIGH (ref 11.5–15.5)
WBC: 14.2 10*3/uL — ABNORMAL HIGH (ref 4.0–10.5)
nRBC: 0 % (ref 0.0–0.2)

## 2023-07-20 LAB — PROTIME-INR
INR: 1 (ref 0.8–1.2)
Prothrombin Time: 13.3 s (ref 11.4–15.2)

## 2023-07-20 LAB — LIPID PANEL
Cholesterol: 255 mg/dL — ABNORMAL HIGH (ref 0–200)
HDL: 55 mg/dL (ref >40)
LDL Cholesterol: 165 mg/dL — ABNORMAL HIGH (ref 0–99)
Total CHOL/HDL Ratio: 4.6 {ratio}
Triglycerides: 174 mg/dL — ABNORMAL HIGH (ref <150)
VLDL: 35 mg/dL (ref 0–40)

## 2023-07-20 LAB — BASIC METABOLIC PANEL
Anion gap: 11 (ref 5–15)
BUN: 12 mg/dL (ref 6–20)
CO2: 22 mmol/L (ref 22–32)
Calcium: 8.9 mg/dL (ref 8.9–10.3)
Chloride: 100 mmol/L (ref 98–111)
Creatinine, Ser: 0.77 mg/dL (ref 0.44–1.00)
GFR, Estimated: >60 mL/min (ref >60)
Glucose, Bld: 330 mg/dL — ABNORMAL HIGH (ref 70–99)
Potassium: 4.7 mmol/L (ref 3.5–5.1)
Sodium: 133 mmol/L — ABNORMAL LOW (ref 135–145)

## 2023-07-20 LAB — APTT: aPTT: 28 s (ref 24–36)

## 2023-07-20 LAB — POCT ACTIVATED CLOTTING TIME
Activated Clotting Time: 293 s
Activated Clotting Time: 354 s

## 2023-07-20 LAB — TROPONIN I (HIGH SENSITIVITY): Troponin I (High Sensitivity): 14 ng/L (ref <18)

## 2023-07-20 SURGERY — CORONARY/GRAFT ACUTE MI REVASCULARIZATION
Anesthesia: Moderate Sedation

## 2023-07-20 MED ORDER — TIROFIBAN HCL IV 12.5 MG/250 ML
INTRAVENOUS | Status: AC
  Filled 2023-07-20: qty 250

## 2023-07-20 MED ORDER — HEPARIN SODIUM (PORCINE) 5000 UNIT/ML IJ SOLN
INTRAMUSCULAR | Status: AC
  Filled 2023-07-20: qty 1

## 2023-07-20 MED ORDER — HEPARIN SODIUM (PORCINE) 1000 UNIT/ML IJ SOLN
INTRAMUSCULAR | Status: AC
  Filled 2023-07-20: qty 10

## 2023-07-20 MED ORDER — MIDAZOLAM HCL 2 MG/2ML IJ SOLN
INTRAMUSCULAR | Status: AC
  Filled 2023-07-20: qty 2

## 2023-07-20 MED ORDER — VERAPAMIL HCL 2.5 MG/ML IV SOLN
INTRAVENOUS | Status: DC | PRN
  Administered 2023-07-20: 2.5 mg via INTRAVENOUS

## 2023-07-20 MED ORDER — SODIUM CHLORIDE 0.9 % IV SOLN: 250.0000 mL | INTRAVENOUS | Status: AC | PRN

## 2023-07-20 MED ORDER — ONDANSETRON HCL 4 MG/2ML IJ SOLN
4.0000 mg | Freq: Four times a day (QID) | INTRAMUSCULAR | Status: DC | PRN
  Administered 2023-07-21: 4 mg via INTRAVENOUS
  Filled 2023-07-20 (×2): qty 2

## 2023-07-20 MED ORDER — HEPARIN SODIUM (PORCINE) 1000 UNIT/ML IJ SOLN
INTRAMUSCULAR | Status: DC | PRN
  Administered 2023-07-20: 2000 [IU] via INTRAVENOUS
  Administered 2023-07-20: 6000 [IU] via INTRAVENOUS

## 2023-07-20 MED ORDER — TIROFIBAN HCL IN NACL 5-0.9 MG/100ML-% IV SOLN
INTRAVENOUS | Status: AC | PRN
  Administered 2023-07-20: .15 ug/kg/min via INTRAVENOUS

## 2023-07-20 MED ORDER — TICAGRELOR 90 MG PO TABS
ORAL_TABLET | ORAL | Status: AC
  Filled 2023-07-20: qty 2

## 2023-07-20 MED ORDER — HEPARIN (PORCINE) IN NACL 2000-0.9 UNIT/L-% IV SOLN
INTRAVENOUS | Status: DC | PRN
  Administered 2023-07-20: 1000 mL

## 2023-07-20 MED ORDER — VERAPAMIL HCL 2.5 MG/ML IV SOLN
INTRAVENOUS | Status: AC
  Filled 2023-07-20: qty 2

## 2023-07-20 MED ORDER — TIROFIBAN (AGGRASTAT) BOLUS VIA INFUSION
INTRAVENOUS | Status: DC | PRN
  Administered 2023-07-20: 2722.5 ug via INTRAVENOUS

## 2023-07-20 MED ORDER — TICAGRELOR 90 MG PO TABS
90.0000 mg | ORAL_TABLET | Freq: Two times a day (BID) | ORAL | Status: DC
  Administered 2023-07-21 – 2023-07-22 (×3): 90 mg via ORAL
  Filled 2023-07-20 (×3): qty 1

## 2023-07-20 MED ORDER — HEPARIN (PORCINE) IN NACL 1000-0.9 UT/500ML-% IV SOLN
INTRAVENOUS | Status: AC
  Filled 2023-07-20: qty 1000

## 2023-07-20 MED ORDER — ATORVASTATIN CALCIUM 20 MG PO TABS
80.0000 mg | ORAL_TABLET | Freq: Every day | ORAL | Status: DC
  Administered 2023-07-21 – 2023-07-22 (×2): 80 mg via ORAL
  Filled 2023-07-20 (×2): qty 4

## 2023-07-20 MED ORDER — LIDOCAINE HCL (PF) 1 % IJ SOLN
INTRAMUSCULAR | Status: DC | PRN
  Administered 2023-07-20: 5 mL

## 2023-07-20 MED ORDER — ONDANSETRON HCL 4 MG/2ML IJ SOLN
4.0000 mg | Freq: Once | INTRAMUSCULAR | Status: AC
  Administered 2023-07-20: 4 mg via INTRAVENOUS
  Filled 2023-07-20: qty 2

## 2023-07-20 MED ORDER — ACETAMINOPHEN 325 MG PO TABS: 650.0000 mg | ORAL_TABLET | ORAL | Status: DC | PRN

## 2023-07-20 MED ORDER — MIDAZOLAM HCL 2 MG/2ML IJ SOLN
INTRAMUSCULAR | Status: DC | PRN
  Administered 2023-07-20: 2 mg via INTRAVENOUS
  Administered 2023-07-20 (×2): 1 mg via INTRAVENOUS

## 2023-07-20 MED ORDER — SODIUM CHLORIDE 0.9% FLUSH: 3.0000 mL | INTRAVENOUS | Status: DC | PRN

## 2023-07-20 MED ORDER — PANTOPRAZOLE SODIUM 40 MG PO TBEC
40.0000 mg | DELAYED_RELEASE_TABLET | Freq: Every day | ORAL | Status: DC
  Administered 2023-07-21 – 2023-07-22 (×2): 40 mg via ORAL
  Filled 2023-07-20 (×2): qty 1

## 2023-07-20 MED ORDER — FENTANYL CITRATE (PF) 100 MCG/2ML IJ SOLN
INTRAMUSCULAR | Status: AC
  Filled 2023-07-20: qty 2

## 2023-07-20 MED ORDER — NITROGLYCERIN 1 MG/10 ML FOR IR/CATH LAB
INTRA_ARTERIAL | Status: DC | PRN
  Administered 2023-07-20 (×3): 200 ug

## 2023-07-20 MED ORDER — IOHEXOL 300 MG/ML  SOLN
INTRAMUSCULAR | Status: DC | PRN
  Administered 2023-07-20: 274 mL

## 2023-07-20 MED ORDER — HEPARIN SODIUM (PORCINE) 5000 UNIT/ML IJ SOLN
5000.0000 [IU] | Freq: Once | INTRAMUSCULAR | Status: AC
  Administered 2023-07-20: 5000 [IU] via INTRAVENOUS

## 2023-07-20 MED ORDER — TICAGRELOR 90 MG PO TABS
ORAL_TABLET | ORAL | Status: DC | PRN
  Administered 2023-07-20: 180 mg via ORAL

## 2023-07-20 MED ORDER — NITROGLYCERIN IN D5W 200-5 MCG/ML-% IV SOLN
INTRAVENOUS | Status: AC
  Filled 2023-07-20: qty 250

## 2023-07-20 MED ORDER — CHLORHEXIDINE GLUCONATE CLOTH 2 % EX PADS
6.0000 | MEDICATED_PAD | Freq: Every day | CUTANEOUS | Status: DC
  Administered 2023-07-21 – 2023-07-22 (×2): 6 via TOPICAL

## 2023-07-20 MED ORDER — NITROGLYCERIN IN D5W 200-5 MCG/ML-% IV SOLN
0.0000 ug/min | INTRAVENOUS | Status: DC
Start: 2023-07-21 — End: 2023-07-22
  Administered 2023-07-21: 20 ug/min via INTRAVENOUS

## 2023-07-20 MED ORDER — MORPHINE SULFATE (PF) 2 MG/ML IV SOLN
2.0000 mg | INTRAVENOUS | Status: DC | PRN
  Administered 2023-07-21: 2 mg via INTRAVENOUS
  Filled 2023-07-20: qty 1

## 2023-07-20 MED ORDER — NITROGLYCERIN 1 MG/10 ML FOR IR/CATH LAB
INTRA_ARTERIAL | Status: AC
  Filled 2023-07-20: qty 10

## 2023-07-20 MED ORDER — ASPIRIN 81 MG PO TBEC
81.0000 mg | DELAYED_RELEASE_TABLET | Freq: Every day | ORAL | Status: DC
  Administered 2023-07-21 – 2023-07-22 (×2): 81 mg via ORAL
  Filled 2023-07-20 (×2): qty 1

## 2023-07-20 MED ORDER — FENTANYL CITRATE (PF) 100 MCG/2ML IJ SOLN
INTRAMUSCULAR | Status: DC | PRN
  Administered 2023-07-20 (×3): 25 ug via INTRAVENOUS

## 2023-07-20 MED ORDER — SODIUM CHLORIDE 0.9% FLUSH
3.0000 mL | Freq: Two times a day (BID) | INTRAVENOUS | Status: DC
  Administered 2023-07-21 – 2023-07-22 (×4): 3 mL via INTRAVENOUS

## 2023-07-20 MED ORDER — SODIUM CHLORIDE 0.9 % IV SOLN: INTRAVENOUS | Status: AC

## 2023-07-20 MED ORDER — ENOXAPARIN SODIUM 40 MG/0.4ML IJ SOSY
40.0000 mg | PREFILLED_SYRINGE | INTRAMUSCULAR | Status: DC
Start: 2023-07-21 — End: 2023-07-22
  Administered 2023-07-21 – 2023-07-22 (×2): 40 mg via SUBCUTANEOUS
  Filled 2023-07-20 (×2): qty 0.4

## 2023-07-20 MED ORDER — MORPHINE SULFATE (PF) 4 MG/ML IV SOLN
4.0000 mg | Freq: Once | INTRAVENOUS | Status: AC
  Administered 2023-07-20: 4 mg via INTRAVENOUS

## 2023-07-20 MED ORDER — MORPHINE SULFATE (PF) 4 MG/ML IV SOLN
INTRAVENOUS | Status: AC
  Filled 2023-07-20: qty 1

## 2023-07-20 MED ORDER — NITROGLYCERIN IN D5W 200-5 MCG/ML-% IV SOLN
INTRAVENOUS | Status: AC | PRN
  Administered 2023-07-20: 10 ug/min via INTRAVENOUS

## 2023-07-20 MED ORDER — HEPARIN (PORCINE) 25000 UT/250ML-% IV SOLN: 1050.0000 [IU]/h | INTRAVENOUS | Status: DC

## 2023-07-20 MED ORDER — LOSARTAN POTASSIUM 50 MG PO TABS
25.0000 mg | ORAL_TABLET | Freq: Every day | ORAL | Status: DC
  Administered 2023-07-21 – 2023-07-22 (×2): 25 mg via ORAL
  Filled 2023-07-20 (×2): qty 1

## 2023-07-20 MED ORDER — METOCLOPRAMIDE HCL 10 MG PO TABS: 10.0000 mg | ORAL_TABLET | Freq: Two times a day (BID) | ORAL | Status: DC | PRN

## 2023-07-20 MED ORDER — PROMETHAZINE HCL 25 MG RE SUPP
25.0000 mg | Freq: Three times a day (TID) | RECTAL | Status: DC | PRN
  Filled 2023-07-20: qty 1

## 2023-07-20 MED ORDER — METOPROLOL SUCCINATE ER 50 MG PO TB24
100.0000 mg | ORAL_TABLET | Freq: Every day | ORAL | Status: DC
  Administered 2023-07-21 – 2023-07-22 (×2): 100 mg via ORAL
  Filled 2023-07-20 (×2): qty 2

## 2023-07-20 SURGICAL SUPPLY — 23 items
BALLN EUPHORA RX 2.0X12 (BALLOONS) ×1
BALLN EUPHORA RX 2.0X15 (BALLOONS) ×1
BALLN ~~LOC~~ TREK NEO RX 2.75X12 (BALLOONS) ×1
BALLOON EUPHORA RX 2.0X12 (BALLOONS) IMPLANT
BALLOON EUPHORA RX 2.0X15 (BALLOONS) IMPLANT
BALLOON ~~LOC~~ TREK NEO RX 2.75X12 (BALLOONS) IMPLANT
CATH LAUNCHER 6FR EBU3.5 (CATHETERS) IMPLANT
CATH LAUNCHER 6FR JR4 (CATHETERS) IMPLANT
DEVICE RAD TR BAND REGULAR (VASCULAR PRODUCTS) IMPLANT
DRAPE BRACHIAL (DRAPES) IMPLANT
GLIDESHEATH SLEND SS 6F .021 (SHEATH) IMPLANT
GUIDEWIRE INQWIRE 1.5J.035X260 (WIRE) IMPLANT
INQWIRE 1.5J .035X260CM (WIRE) ×1
KIT ENCORE 26 ADVANTAGE (KITS) IMPLANT
PACK CARDIAC CATH (CUSTOM PROCEDURE TRAY) ×1 IMPLANT
PANNUS RETENTION SYSTEM 2 PAD (MISCELLANEOUS) IMPLANT
PROTECTION STATION PRESSURIZED (MISCELLANEOUS) ×1
SET ATX-X65L (MISCELLANEOUS) IMPLANT
STATION PROTECTION PRESSURIZED (MISCELLANEOUS) IMPLANT
STENT ONYX FRONTIER 2.0X15 (Permanent Stent) IMPLANT
STENT ONYX FRONTIER 2.5X18 (Permanent Stent) IMPLANT
WIRE RUNTHROUGH .014X180CM (WIRE) IMPLANT
WIRE RUNTHROUGH IZANAI 014 180 (WIRE) IMPLANT

## 2023-07-20 NOTE — Progress Notes (Signed)
PHARMACY - ANTICOAGULATION CONSULT NOTE  Pharmacy Consult for Heparin Infusion Indication: chest pain/ACS  Allergies  Allergen Reactions   Quinolones Other (See Comments)    Patient Measurements: Height: 5\' 6"  (167.6 cm) Weight: 108.9 kg (240 lb) IBW/kg (Calculated) : 59.3 Heparin Dosing Weight: 84.5 kg  Vital Signs: Temp: 98.6 F (37 C) (10/19 2132) BP: 149/110 (10/19 2132) Pulse Rate: 108 (10/19 2132)  Labs: No results for input(s): "HGB", "HCT", "PLT", "APTT", "LABPROT", "INR", "HEPARINUNFRC", "HEPRLOWMOCWT", "CREATININE", "CKTOTAL", "CKMB", "TROPONINIHS" in the last 72 hours.  CrCl cannot be calculated (Patient's most recent lab result is older than the maximum 21 days allowed.).   Medical History: Past Medical History:  Diagnosis Date   Barrett esophagus    Diabetes mellitus without complication (HCC)    Gastroparesis    Migraines    MRSA (methicillin resistant staph aureus) culture positive    2009   Obesity     Assessment: Patient is a 42 year old female with a past medical history per chart of HTN, T2DM, and NSTEMI in 2022.  Per chart review, patient was not on anticoagulation prior to admission. No signs/symptoms of bleeding noted in chart. Hgb 12.3. PLT 479. Baseline INR and aPTT in process.   Goal of Therapy:  Heparin level 0.3-0.7 units/ml Monitor platelets by anticoagulation protocol: Yes   Plan:  MD ordered 5000 units bolus x 1 Start heparin infusion at 1050 units/hr Check heparin level in 6 hours  CBC and heparin level daily while on heparin infusion  Merryl Hacker, PharmD Clinical Pharmacist 07/20/2023,9:46 PM

## 2023-07-20 NOTE — ED Notes (Signed)
EDP at bedside at this time. VORB and confirmed medications and medication doses.

## 2023-07-20 NOTE — ED Notes (Signed)
This RN and Aquilla Solian, RN remain at bedside with patient, awaiting cardiologist arrive the bedside at this time. Pt's belongings placed in patient belonging bag at this time.

## 2023-07-20 NOTE — ED Notes (Addendum)
Pt states sudden onset CP that started while she was in the shower. Pt arrives rolling around in the bed, and repeatedly crying out and repeatedly yelling. Per triage RN pt received 324 ASA and 2 doses nitroglycerin en route.

## 2023-07-20 NOTE — ED Provider Notes (Signed)
[facility] Provider Note    Event Date/Time   First MD Initiated Contact with Patient 07/20/23 2137     (approximate)   History   Chief Complaint Chest Pain and Code STEMI   HPI  Karen Ruiz is a 42 y.o. female with past medical history of hypertension, hyperlipidemia, diabetes, CAD, and gastroparesis who presents to the ED complaining of chest pain.  Patient reports that she was in the shower about an hour prior to arrival when she suddenly developed severe pain in the center of her chest.  She describes it as crushing and burning, has been constant since onset with no exacerbating or alleviating factors.  She denies any associated fevers or cough, has not had any difficulty breathing.  She received loading dose of aspirin along with 3 sprays of nitroglycerin with EMS, reports mild improvement in her pain.      Physical Exam   Triage Vital Signs: ED Triage Vitals [07/20/23 2132]  Encounter Vitals Group     BP (!) 149/110     Systolic BP Percentile      Diastolic BP Percentile      Pulse Rate (!) 108     Resp 18     Temp 98.6 F (37 C)     Temp src      SpO2 98 %     Weight 240 lb (108.9 kg)     Height 5\' 6"  (1.676 m)     Head Circumference      Peak Flow      Pain Score 10     Pain Loc      Pain Education      Exclude from Growth Chart     Most recent vital signs: Vitals:   07/20/23 2132 07/20/23 2219  BP: (!) 149/110   Pulse: (!) 108   Resp: 18   Temp: 98.6 F (37 C)   SpO2: 98% 98%    Constitutional: Alert and oriented. Eyes: Conjunctivae are normal. Head: Atraumatic. Nose: No congestion/rhinnorhea. Mouth/Throat: Mucous membranes are moist.  Cardiovascular: Tachycardic, regular rhythm. Grossly normal heart sounds.  2+ radial pulses bilaterally. Respiratory: Normal respiratory effort.  No retractions. Lungs CTAB. Gastrointestinal: Soft and nontender. No distention. Musculoskeletal: No lower extremity tenderness nor  edema.  Neurologic:  Normal speech and language. No gross focal neurologic deficits are appreciated.    ED Results / Procedures / Treatments   Labs (all labs ordered are listed, but only abnormal results are displayed) Labs Reviewed  BASIC METABOLIC PANEL - Abnormal; Notable for the following components:      Result Value   Sodium 133 (*)    Glucose, Bld 330 (*)    All other components within normal limits  CBC - Abnormal; Notable for the following components:   WBC 14.2 (*)    RBC 5.51 (*)    MCV 74.2 (*)    MCH 22.3 (*)    RDW 16.5 (*)    Platelets 479 (*)    All other components within normal limits  LIPID PANEL - Abnormal; Notable for the following components:   Cholesterol 255 (*)    Triglycerides 174 (*)    LDL Cholesterol 165 (*)    All other components within normal limits  PROTIME-INR  APTT  HEPARIN LEVEL (UNFRACTIONATED)  CBC  POCT ACTIVATED CLOTTING TIME  POCT ACTIVATED CLOTTING TIME  TROPONIN I (HIGH SENSITIVITY)  TROPONIN I (HIGH SENSITIVITY)     EKG  ED ECG REPORT I,  Chesley Noon, the attending physician, personally viewed and interpreted this ECG.   Date: 07/20/2023  EKG Time: 21:31  Rate: 112  Rhythm: sinus tachycardia  Axis: Normal  Intervals:none  ST&T Change: ST elevation inferiorly with anterolateral ST depressions  PROCEDURES:  Critical Care performed: Yes, see critical care procedure note(s)  .Critical Care  Performed by: Chesley Noon, MD Authorized by: Chesley Noon, MD   Critical care provider statement:    Critical care time (minutes):  30   Critical care time was exclusive of:  Separately billable procedures and treating other patients and teaching time   Critical care was necessary to treat or prevent imminent or life-threatening deterioration of the following conditions:  Cardiac failure   Critical care was time spent personally by me on the following activities:  Development of treatment plan with patient or  surrogate, discussions with consultants, evaluation of patient's response to treatment, examination of patient, ordering and review of laboratory studies, ordering and review of radiographic studies, ordering and performing treatments and interventions, pulse oximetry, re-evaluation of patient's condition and review of old charts   I assumed direction of critical care for this patient from another provider in my specialty: no     Care discussed with: admitting provider      MEDICATIONS ORDERED IN ED: Medications  heparin ADULT infusion 100 units/mL (25000 units/258mL) (0 Units/hr Intravenous Hold 07/20/23 2201)  ticagrelor (BRILINTA) tablet 90 mg (has no administration in time range)  heparin injection 5,000 Units (5,000 Units Intravenous Given 07/20/23 2147)  morphine (PF) 4 MG/ML injection 4 mg (4 mg Intravenous Given 07/20/23 2147)  ondansetron (ZOFRAN) injection 4 mg (4 mg Intravenous Given 07/20/23 2147)  nitroGLYCERIN 50 mg in dextrose 5 % 250 mL (0.2 mg/mL) infusion (20 mcg/min Intravenous Rate/Dose Change 07/20/23 2331)  tirofiban (AGGRASTAT) infusion 50 mcg/mL 100 mL (0.15 mcg/kg/min  108.9 kg Intravenous New Bag/Given 07/20/23 2327)     IMPRESSION / MDM / ASSESSMENT AND PLAN / ED COURSE  I reviewed the triage vital signs and the nursing notes.                              42 y.o. female with past medical history of hypertension, hyperlipidemia, diabetes, and CAD who presents to the ED complaining of constant severe chest pain for about the past hour.  Patient's presentation is most consistent with acute presentation with potential threat to life or bodily function.  Differential diagnosis includes, but is not limited to, ACS, PE, pneumonia, pneumothorax, musculoskeletal pain, GERD, anxiety.  Patient uncomfortable appearing and clutching her chest, vital signs remarkable for tachycardia and hypertension.  Prehospital EKG was reportedly unremarkable, however EKG in triage  concerning for inferior STEMI with ST elevation and reciprocal changes laterally.  Code STEMI was activated and patient immediately brought back to a room, given IV morphine for ongoing pain in her chest.  Labs show mild leukocytosis, no significant anemia, electrolyte abnormality, or AKI.  Patient taken to the Cath Lab with Dr. Kirke Corin.      FINAL CLINICAL IMPRESSION(S) / ED DIAGNOSES   Final diagnoses:  ST elevation myocardial infarction (STEMI), unspecified artery [facility])     Rx / DC Orders   ED Discharge Orders          Ordered    AMB Referral to Cardiac Rehabilitation - Phase II        07/20/23 2342  Note:  This document was prepared using Dragon voice recognition software and may include unintentional dictation errors.   Chesley Noon, MD 07/20/23 2352

## 2023-07-20 NOTE — ED Notes (Signed)
EDP at bedside at this time. Pt arrives to room undressed, bilateral 18G IV's in place at this time. Blood collected and sent to lab.

## 2023-07-20 NOTE — ED Notes (Signed)
Cardiologist at bedside speaking with pt.

## 2023-07-20 NOTE — ED Triage Notes (Addendum)
Arrives EMS from home with sudden severe chest pains manifesting ~8PM while standing in shower.   Very extensive cardiac history with multiple MI.   EMS administer:   fentanyl  324mg  ASA 4mf zofran  3 sprays of nitroglycerine  1.5 inch of nitroglycerin paste on chest

## 2023-07-20 NOTE — ED Notes (Signed)
Code Stemi called spoke with [place]

## 2023-07-21 ENCOUNTER — Other Ambulatory Visit: Payer: Self-pay

## 2023-07-21 ENCOUNTER — Inpatient Hospital Stay (HOSPITAL_COMMUNITY)
Admit: 2023-07-21 | Discharge: 2023-07-21 | Disposition: A | Discharge status: Home / Self Care | Payer: Medicaid Other | Attending: Cardiovascular Disease | Admitting: Cardiovascular Disease

## 2023-07-21 DIAGNOSIS — I213 ST elevation (STEMI) myocardial infarction of unspecified site: Secondary | ICD-10-CM

## 2023-07-21 DIAGNOSIS — I214 Non-ST elevation (NSTEMI) myocardial infarction: Secondary | ICD-10-CM

## 2023-07-21 DIAGNOSIS — I2119 ST elevation (STEMI) myocardial infarction involving other coronary artery of inferior wall: Secondary | ICD-10-CM

## 2023-07-21 DIAGNOSIS — I10 Essential (primary) hypertension: Secondary | ICD-10-CM | POA: Diagnosis present

## 2023-07-21 DIAGNOSIS — I2510 Atherosclerotic heart disease of native coronary artery without angina pectoris: Secondary | ICD-10-CM

## 2023-07-21 DIAGNOSIS — E1143 Type 2 diabetes mellitus with diabetic autonomic (poly)neuropathy: Secondary | ICD-10-CM | POA: Diagnosis present

## 2023-07-21 DIAGNOSIS — I5021 Acute systolic (congestive) heart failure: Secondary | ICD-10-CM | POA: Diagnosis present

## 2023-07-21 DIAGNOSIS — E785 Hyperlipidemia, unspecified: Secondary | ICD-10-CM | POA: Diagnosis not present

## 2023-07-21 DIAGNOSIS — I5022 Chronic systolic (congestive) heart failure: Secondary | ICD-10-CM | POA: Diagnosis present

## 2023-07-21 DIAGNOSIS — K3184 Gastroparesis: Secondary | ICD-10-CM

## 2023-07-21 HISTORY — DX: ST elevation (STEMI) myocardial infarction involving other coronary artery of inferior wall: I21.19

## 2023-07-21 LAB — BASIC METABOLIC PANEL
Anion gap: 10 (ref 5–15)
BUN: 12 mg/dL (ref 6–20)
CO2: 22 mmol/L (ref 22–32)
Calcium: 8.4 mg/dL — ABNORMAL LOW (ref 8.9–10.3)
Chloride: 102 mmol/L (ref 98–111)
Creatinine, Ser: 0.46 mg/dL (ref 0.44–1.00)
GFR, Estimated: >60 mL/min (ref >60)
Glucose, Bld: 184 mg/dL — ABNORMAL HIGH (ref 70–99)
Potassium: 3.7 mmol/L (ref 3.5–5.1)
Sodium: 134 mmol/L — ABNORMAL LOW (ref 135–145)

## 2023-07-21 LAB — GLUCOSE, CAPILLARY
Glucose-Capillary: 351 mg/dL — ABNORMAL HIGH (ref 70–99)
Glucose-Capillary: 401 mg/dL — ABNORMAL HIGH (ref 70–99)
Glucose-Capillary: 384 mg/dL — ABNORMAL HIGH (ref 70–99)
Glucose-Capillary: 143 mg/dL — ABNORMAL HIGH (ref 70–99)
Glucose-Capillary: 153 mg/dL — ABNORMAL HIGH (ref 70–99)
Glucose-Capillary: 238 mg/dL — ABNORMAL HIGH (ref 70–99)
Glucose-Capillary: 314 mg/dL — ABNORMAL HIGH (ref 70–99)

## 2023-07-21 LAB — ECHOCARDIOGRAM COMPLETE
AR max vel: 2.83 cm2
AV Area VTI: 2.98 cm2
AV Area mean vel: 2.47 cm2
AV Mean grad: 5 mm[Hg]
AV Peak grad: 8.2 mm[Hg]
Ao pk vel: 1.43 m/s
Area-P 1/2: 4.39 cm2
Height: 66 in
MV VTI: 3.27 cm2
S' Lateral: 3.6 cm
Weight: 3840 [oz_av]

## 2023-07-21 LAB — URINE DRUG SCREEN, QUALITATIVE (ARMC ONLY)
Amphetamines, Ur Screen: POSITIVE — AB
Barbiturates, Ur Screen: NOT DETECTED
Benzodiazepine, Ur Scrn: POSITIVE — AB
Cannabinoid 50 Ng, Ur ~~LOC~~: POSITIVE — AB
Cocaine Metabolite,Ur ~~LOC~~: NOT DETECTED
MDMA (Ecstasy)Ur Screen: NOT DETECTED
Methadone Scn, Ur: NOT DETECTED
Opiate, Ur Screen: POSITIVE — AB
Phencyclidine (PCP) Ur S: NOT DETECTED
Tricyclic, Ur Screen: NOT DETECTED

## 2023-07-21 LAB — CBC
HCT: 33.6 % — ABNORMAL LOW (ref 36.0–46.0)
Hemoglobin: 10.7 g/dL — ABNORMAL LOW (ref 12.0–15.0)
MCH: 23.1 pg — ABNORMAL LOW (ref 26.0–34.0)
MCHC: 31.8 g/dL (ref 30.0–36.0)
MCV: 72.4 fL — ABNORMAL LOW (ref 80.0–100.0)
Platelets: 395 10*3/uL (ref 150–400)
RBC: 4.64 MIL/uL (ref 3.87–5.11)
RDW: 16.4 % — ABNORMAL HIGH (ref 11.5–15.5)
WBC: 12.1 10*3/uL — ABNORMAL HIGH (ref 4.0–10.5)
nRBC: 0 % (ref 0.0–0.2)

## 2023-07-21 LAB — MRSA NEXT GEN BY PCR, NASAL: MRSA by PCR Next Gen: NOT DETECTED

## 2023-07-21 LAB — TROPONIN I (HIGH SENSITIVITY)
Troponin I (High Sensitivity): 22 ng/L — ABNORMAL HIGH (ref <18)
Troponin I (High Sensitivity): 278 ng/L (ref <18)

## 2023-07-21 LAB — HEMOGLOBIN A1C
Hgb A1c MFr Bld: 11 % — ABNORMAL HIGH (ref 4.8–5.6)
Mean Plasma Glucose: 269 mg/dL

## 2023-07-21 MED ORDER — ORAL CARE MOUTH RINSE: 15.0000 mL | OROMUCOSAL | Status: DC | PRN

## 2023-07-21 MED ORDER — HYDROMORPHONE HCL 1 MG/ML IJ SOLN
1.0000 mg | INTRAMUSCULAR | Status: DC | PRN
  Administered 2023-07-21 – 2023-07-22 (×3): 1 mg via INTRAVENOUS
  Filled 2023-07-21 (×3): qty 1

## 2023-07-21 MED ORDER — INSULIN ASPART 100 UNIT/ML IJ SOLN
0.0000 [IU] | Freq: Three times a day (TID) | INTRAMUSCULAR | Status: DC
  Administered 2023-07-21: 2 [IU] via SUBCUTANEOUS
  Administered 2023-07-21: 5 [IU] via SUBCUTANEOUS
  Administered 2023-07-21: 3 [IU] via SUBCUTANEOUS
  Administered 2023-07-22: 8 [IU] via SUBCUTANEOUS
  Filled 2023-07-21 (×5): qty 1

## 2023-07-21 MED ORDER — INSULIN ASPART 100 UNIT/ML IJ SOLN
20.0000 [IU] | Freq: Once | INTRAMUSCULAR | Status: AC
  Administered 2023-07-21: 20 [IU] via SUBCUTANEOUS
  Filled 2023-07-21: qty 1

## 2023-07-21 MED ORDER — ALUM & MAG HYDROXIDE-SIMETH 200-200-20 MG/5ML PO SUSP
30.0000 mL | ORAL | Status: DC | PRN
  Administered 2023-07-21: 30 mL via ORAL
  Filled 2023-07-21: qty 30

## 2023-07-21 MED ORDER — METOCLOPRAMIDE HCL 5 MG/ML IJ SOLN
10.0000 mg | Freq: Four times a day (QID) | INTRAMUSCULAR | Status: DC
  Administered 2023-07-21 (×3): 10 mg via INTRAVENOUS
  Filled 2023-07-21 (×4): qty 2

## 2023-07-21 MED ORDER — INSULIN ASPART 100 UNIT/ML IJ SOLN
0.0000 [IU] | Freq: Every day | INTRAMUSCULAR | Status: DC
  Administered 2023-07-21: 4 [IU] via SUBCUTANEOUS
  Filled 2023-07-21: qty 1

## 2023-07-21 MED ORDER — TIROFIBAN HCL IN NACL 5-0.9 MG/100ML-% IV SOLN
0.1500 ug/kg/min | INTRAVENOUS | Status: AC
  Administered 2023-07-21: 0.15 ug/kg/min via INTRAVENOUS
  Filled 2023-07-21 (×2): qty 100

## 2023-07-21 MED ORDER — INSULIN ASPART 100 UNIT/ML IJ SOLN: 0.0000 [IU] | Freq: Three times a day (TID) | INTRAMUSCULAR | Status: DC

## 2023-07-21 NOTE — Progress Notes (Signed)
Blood sugar@ 0237=401. Dr Arville Care made aware. Advised to give 20 units of insulin and repeat CBG after 1 hour. Repeat blood sugar is 384. Per dr Arville Care, to give another 20 units of insulin and follow SSI. Diabetes nurse referral done.

## 2023-07-21 NOTE — Progress Notes (Signed)
   Patient Name: Karen Ruiz [id number] Date of Encounter: 07/21/2023 [facility] Cardiologist: None   Interval Summary  .    Seen on AM rounds. Denies any chest pain or shortness of breath.  Family remains at the bedside.  Vital Signs .    Vitals:   07/21/23 0400 07/21/23 0500 07/21/23 0600 07/21/23 0700  BP: 124/66 100/66 118/77 113/83  Pulse: 89 86 85 85  Resp: 15 (!) 25 15 13   Temp: 98.2 F (36.8 C)     TempSrc: Oral     SpO2: 94% 96% 99% 97%  Weight:      Height:        Intake/Output Summary (Last 24 hours) at 07/21/2023 0758 Last data filed at 07/21/2023 6578 Gross per 24 hour  Intake 881.07 ml  Output 675 ml  Net 206.07 ml      07/20/2023    9:32 PM 02/19/2023    2:48 PM 02/08/2023   11:04 AM  Last 3 Weights  Weight (lbs) 240 lb 229 lb 4.5 oz 230 lb  Weight (kg) 108.863 kg 104 kg 104.327 kg      Telemetry/ECG    Sinus with rates of 80-90 - Personally Reviewed  Physical Exam .   GEN: No acute distress.   Neck: No JVD Cardiac: RRR, no murmurs, rubs, or gallops.  Right radial cath site with gauze and OpSite dressing clean dry and intact, no bleeding, bruising, or hematoma noted, 2+ radial pulse Respiratory: Clear to auscultation bilaterally.  Respirations are unlabored at rest on room air GI: Soft, nontender, non-distended  MS: No edema  Assessment & Plan .     Inferior/inferolateral STEMI -patient presented with chest pain and underwent emergency catheterization -successful bifurcation stenting of the dLCX and OM -Right radial cath site without bleeding bruising or hematoma -She remains chest pain-free on rounds this morning -Was maintained on Aggrastat for 12 hours -Will stay on aspirin 81 mg daily and 90 mg of Brilinta twice daily for minimum of 12 months but preferably indefinitely -Continued on telemetry monitoring -Cardiac rehab on discharge -Encouraged to be up out of bed moving around the room today -EKG as needed for pain or  changes  Acute on chronic systolic congestive heart failure -Denies any shortness of breath -Echocardiogram ordered and pending with further recommendations to follow -Continued on beta-blocker and ARB -Will escalate GDMT after echocardiogram completed as tolerated by blood pressure and kidney function -Heart failure education -Daily weights, I's and O's, low-sodium diet  Dyslipidemia -Continue atorvastatin 80 mg daily -LDL 165 -LP(a) pending  Hypertension -Blood pressure 112/62 -Continue on losartan 25 mg daily, Toprol-XL 100 mg daily -Vital signs per unit protocol  Type 2 diabetes -Continued on insulin therapy -Continue management per IM  Tobacco abuse, remote cocaine use -Total cessation is recommended -Urine drug screen revealed positive for amphetamines, opioids, cannabinoid, and benzodiazepine -Total cessation is recommended of her illicit drug use, of note the patient did receive pain medication prior to her procedure and her UDS was done after her procedure  For questions or updates, please contact [facility] Please consult [email or web address] for contact info under        Signed, Leeloo Silverthorne, NP

## 2023-07-21 NOTE — Plan of Care (Signed)
  Problem: Education: Goal: Knowledge of General Education information will improve Description: Including pain rating scale, medication(s)/side effects and non-pharmacologic comfort measures Outcome: Progressing   Problem: Health Behavior/Discharge Planning: Goal: Ability to manage health-related needs will improve Outcome: Progressing   Problem: Clinical Measurements: Goal: Ability to maintain clinical measurements within normal limits will improve Outcome: Progressing Goal: Will remain free from infection Outcome: Progressing Goal: Diagnostic test results will improve Outcome: Progressing Goal: Respiratory complications will improve Outcome: Progressing Goal: Cardiovascular complication will be avoided Outcome: Progressing   Problem: Activity: Goal: Risk for activity intolerance will decrease Outcome: Progressing   Problem: Nutrition: Goal: Adequate nutrition will be maintained Outcome: Progressing   Problem: Coping: Goal: Level of anxiety will decrease Outcome: Progressing   Problem: Elimination: Goal: Will not experience complications related to bowel motility Outcome: Progressing Goal: Will not experience complications related to urinary retention Outcome: Progressing   Problem: Pain Managment: Goal: General experience of comfort will improve Outcome: Progressing   Problem: Safety: Goal: Ability to remain free from injury will improve Outcome: Progressing   Problem: Skin Integrity: Goal: Risk for impaired skin integrity will decrease Outcome: Progressing   Problem: Education: Goal: Understanding of CV disease, CV risk reduction, and recovery process will improve Outcome: Progressing Goal: Individualized Educational Video(s) Outcome: Progressing   Problem: Activity: Goal: Ability to return to baseline activity level will improve Outcome: Progressing   Problem: Cardiovascular: Goal: Ability to achieve and maintain adequate cardiovascular perfusion  will improve Outcome: Progressing Goal: Vascular access site(s) Level 0-1 will be maintained Outcome: Progressing   Problem: Health Behavior/Discharge Planning: Goal: Ability to safely manage health-related needs after discharge will improve Outcome: Progressing   Problem: Education: Goal: Ability to describe self-care measures that may prevent or decrease complications (Diabetes Survival Skills Education) will improve Outcome: Progressing Goal: Individualized Educational Video(s) Outcome: Progressing   Problem: Coping: Goal: Ability to adjust to condition or change in health will improve Outcome: Progressing   Problem: Fluid Volume: Goal: Ability to maintain a balanced intake and output will improve Outcome: Progressing   Problem: Health Behavior/Discharge Planning: Goal: Ability to identify and utilize available resources and services will improve Outcome: Progressing Goal: Ability to manage health-related needs will improve Outcome: Progressing   Problem: Nutritional: Goal: Maintenance of adequate nutrition will improve Outcome: Progressing   Problem: Skin Integrity: Goal: Risk for impaired skin integrity will decrease Outcome: Progressing   Problem: Tissue Perfusion: Goal: Adequacy of tissue perfusion will improve Outcome: Progressing   Problem: Metabolic: Goal: Ability to maintain appropriate glucose levels will improve Outcome: Not Progressing   Problem: Nutritional: Goal: Progress toward achieving an optimal weight will improve Outcome: Not Progressing

## 2023-07-21 NOTE — Hospital Course (Addendum)
Taken from H&P.  Karen Ruiz is a 42 y.o. female with medical history significant for type diabetes mellitus and diabetic gastroparesis, history of cocaine abuse, migraine, coronary artery disease s/p MI in 2022 s/p PCI and DES in the RCA and another stent in the proximal/mid LAD with [facility] cardiolog, who presented to the emergency room with acute onset of midsternal chest pain while she was taking a shower about an hour prior to arrival to the ED.   On presentation to ED, blood pressure elevated at 156/117, HR 102, labs with mild leukocytosis of 14.2, troponin 14>>22.  EKG with concern of STEMI in inferolateral leads with reciprocal ST depression.  Code STEMI was activated and patient was emergently taken to Cath Lab s/p stents to her LCA and its branch.  Cath shows patent stents in LAD and RCA with mild restenosis.  Patient was later admitted to ICU.  Cardiology recommendations are DAPT with aspirin and ticagrelor for at least 12 months and preferably indefinitely. She was also started on nitroglycerin drip for pain control. Also had acute on chronic systolic heart failure due to MI, pending echocardiogram.  10/20: Vital stable.  Overnight elevated blood glucose level, troponin this morning at 278, improving leukocytosis at 12, hemoglobin decreased to 10.7 from 12, UDS positive for amphetamine, opioids and cannabinoid.  Lipid panel with hyperlipidemia, LDL of 168. Echocardiogram done-pending results.  Cardiology is recommending escalation of GDMT after echocardiogram results, currently continuing beta-blocker and ARB.  10/21: Hemodynamically stable.  Echocardiogram normal with no regional wall motion abnormalities.  Labs stable, mild pseudohyponatremia secondary to hyperglycemia.  Hemoglobin stable at 10.5.  Patient remained chest pain-free, able to ambulate without any difficulty.  She is being discharged on aspirin and Brilinta, likely will need indefinitely and need to have a close  follow-up with her cardiologist for further recommendations and management.

## 2023-07-21 NOTE — Assessment & Plan Note (Addendum)
Continue statin. 

## 2023-07-21 NOTE — Assessment & Plan Note (Addendum)
Pending A1c. -Continue home as needed Reglan -Continue with SSI

## 2023-07-21 NOTE — Progress Notes (Signed)
Progress Note   Patient: Karen Ruiz ZOX:[id number] DOB: 13-Jul-1981 DOA: 07/20/2023     1 DOS: the patient was seen and examined on 07/21/2023   Brief hospital course: Taken from H&P.  Karen Ruiz is a 42 y.o. female with medical history significant for type diabetes mellitus and diabetic gastroparesis, history of cocaine abuse, migraine, coronary artery disease s/p MI in 2022 s/p PCI and DES in the RCA and another stent in the proximal/mid LAD with [facility] cardiolog, who presented to the emergency room with acute onset of midsternal chest pain while she was taking a shower about an hour prior to arrival to the ED.   On presentation to ED, blood pressure elevated at 156/117, HR 102, labs with mild leukocytosis of 14.2, troponin 14>>22.  EKG with concern of STEMI in inferolateral leads with reciprocal ST depression.  Code STEMI was activated and patient was emergently taken to Cath Lab s/p stents to her LCA and its branch.  Cath shows patent stents in LAD and RCA with mild restenosis.  Patient was later admitted to ICU.  Cardiology recommendations are DAPT with aspirin and ticagrelor for at least 12 months and preferably indefinitely. She was also started on nitroglycerin drip for pain control. Also had acute on chronic systolic heart failure due to MI, pending echocardiogram.  10/20: Vital stable.  Overnight elevated blood glucose level, troponin this morning at 278, improving leukocytosis at 12, hemoglobin decreased to 10.7 from 12, UDS positive for amphetamine, opioids and cannabinoid.  Lipid panel with hyperlipidemia, LDL of 168. Echocardiogram done-pending results.  Cardiology is recommending escalation of GDMT after echocardiogram results, currently continuing beta-blocker and ARB.   Assessment and Plan: * Acute ST elevation myocardial infarction (STEMI) involving other coronary artery of inferior wall (HCC) S/P PCI and 2 stents placement by Dr. Kirke Corin  No chest pain or  shortness of breath this morning. -12 hours of Aggrastat -Continue DAPT with aspirin and Brilinta for minimum of 12 months but preferably indefinitely -Counseling was provided for smoking -Better control of risk factors which include smoking, hypertension in a lady with strong family history of cardiac disease.  Acute HFrEF (heart failure with reduced ejection fraction) (HCC) Prior echocardiogram with normal EF and grade 1 diastolic dysfunction.  EF during cardiac cath was noted as 35 to 45%, likely due to current STEMI. Postprocedure echocardiogram done-pending results -Cardiology will escalate GDMT after echocardiogram results -Continue beta-blocker and ARB -Daily weight and BMP -Monitor volume status closely  Essential hypertension Blood pressure within goal -Continue Toprol and losartan  Diabetic gastroparesis associated with type 2 diabetes mellitus (HCC) Pending A1c. -Continue home as needed Reglan -Continue with SSI  Dyslipidemia -Continue statin   Subjective: Patient was seen and examined today.  No current chest pain or shortness of breath.  Physical Exam: Vitals:   07/21/23 0900 07/21/23 1000 07/21/23 1100 07/21/23 1311  BP: 137/67 112/62 (!) 129/90 (!) 123/92  Pulse: 82 79 65 74  Resp: 16 (!) 21 (!) 25 14  Temp:   98 F (36.7 C)   TempSrc:   Oral   SpO2: 97% 92% 92% 97%  Weight:      Height:       General.  Obese lady, in no acute distress. Pulmonary.  Lungs clear bilaterally, normal respiratory effort. CV.  Regular rate and rhythm, no JVD, rub or murmur. Abdomen.  Soft, nontender, nondistended, BS positive. CNS.  Alert and oriented .  No focal neurologic deficit. Extremities.  No edema, no cyanosis,  pulses intact and symmetrical. Psychiatry.  Judgment and insight appears normal.   Data Reviewed: Prior data reviewed  Family Communication: Discussed with patient and fianc at bedside  Disposition: Status is: Inpatient Remains inpatient appropriate  because: Severity of illness  Planned Discharge Destination: Home  DVT prophylaxis.  Lovenox Time spent:  minutes  This record has been created using Conservation officer, historic buildings. Errors have been sought and corrected,but may not always be located. Such creation errors do not reflect on the standard of care.   Author: Arnetha Courser, MD 07/21/2023 1:32 PM  For on call review [email or web address].

## 2023-07-21 NOTE — Assessment & Plan Note (Signed)
Prior echocardiogram with normal EF and grade 1 diastolic dysfunction.  EF during cardiac cath was noted as 35 to 45%, likely due to current STEMI. Postprocedure echocardiogram done-pending results -Cardiology will escalate GDMT after echocardiogram results -Continue beta-blocker and ARB -Daily weight and BMP -Monitor volume status closely

## 2023-07-21 NOTE — Consult Note (Signed)
Cardiology Consultation   Patient ID: Karen Ruiz MRN: [id number]; DOB: 02-23-1981  Admit date: 07/20/2023 Date of Consult: 07/21/2023  PCP:  Dan Humphreys, MD   [facility] Providers Cardiologist: [facility] cardiology   Patient Profile:   Karen Ruiz is a 42 y.o. female with a hx of coronary artery disease who is being seen 07/21/2023 for the evaluation of inferior ST elevation myocardial infarction at the request of Dr. Larinda Buttery.  History of Present Illness:   Ms. Karen Ruiz is a 42 year old female with prolonged history of diabetes mellitus, gastroparesis, essential hypertension, hyperlipidemia, tobacco use and previous cocaine use according to documentation from [facility].  The patient denies recent cocaine use. She has extensive cardiac history with previous myocardial infarction in 2022.  She was treated at that time with drug-eluting stent placement to the right coronary artery by Dr. Okey Dupre.  She has been following up with [facility] cardiology since then and has underwent stenting of the proximal/mid LAD.  She was hospitalized in June of this year with elevated troponin thought to be due to myocarditis.  Repeat cardiac catheterization at that shown showed patent stents.  She was hospitalized on August with lateral ST elevation myocardial infarction.  Cardiac catheterization showed an occluded diagonal branch which was felt to be the culprit. She reports having severe gastroparesis with recent vomiting for a few days with inability to take most of her medications including Brilinta.  She presented with acute onset of chest pain.  EKG showed ST elevation in the inferior leads as well as V4 V5 and V6.  She was having severe pain and given that and her EKG changes, code STEMI was activated.  She was given aspirin and unfractionated heparin in addition to morphine.  Emergent cardiac catheterization was recommended.   Past Medical History:  Diagnosis Date   Barrett esophagus     Diabetes mellitus without complication (HCC)    Gastroparesis    Migraines    MRSA (methicillin resistant staph aureus) culture positive    2009   Obesity     Past Surgical History:  Procedure Laterality Date   CARDIAC CATHETERIZATION     CESAREAN SECTION     CORONARY STENT INTERVENTION N/A 05/02/2021   Procedure: CORONARY STENT INTERVENTION;  Surgeon: Yvonne Kendall, MD;  Location: [facility] INVASIVE CV LAB;  Service: Cardiovascular;  Laterality: N/A;   CORONARY ULTRASOUND/IVUS N/A 05/02/2021   Procedure: Intravascular Ultrasound/IVUS;  Surgeon: Yvonne Kendall, MD;  Location: [facility] INVASIVE CV LAB;  Service: Cardiovascular;  Laterality: N/A;   ESOPHAGOGASTRODUODENOSCOPY N/A 07/28/2019   Procedure: ESOPHAGOGASTRODUODENOSCOPY (EGD);  Surgeon: Toney Reil, MD;  Location: [facility] ENDOSCOPY;  Service: Gastroenterology;  Laterality: N/A;   LEFT HEART CATH AND CORONARY ANGIOGRAPHY N/A 05/02/2021   Procedure: LEFT HEART CATH AND CORONARY ANGIOGRAPHY;  Surgeon: Yvonne Kendall, MD;  Location: [facility] INVASIVE CV LAB;  Service: Cardiovascular;  Laterality: N/A;     Home Medications:  Prior to Admission medications   Medication Sig Start Date End Date Taking? Authorizing Provider  aspirin EC 81 MG EC tablet Take 1 tablet (81 mg total) by mouth daily. Swallow whole. 05/04/21   Delfino Lovett, MD  atorvastatin (LIPITOR) 80 MG tablet Take 1 tablet (80 mg total) by mouth daily. 05/31/21 08/29/21  Marisue Ivan D, PA-C  doxycycline (VIBRA-TABS) 100 MG tablet Take 1 tablet (100 mg total) by mouth 2 (two) times daily. 01/10/23   Lenn Sink, DPM  escitalopram (LEXAPRO) 5 MG tablet Take by mouth daily. 06/12/21 09/10/21  [provider]  insulin aspart (NOVOLOG) 100 UNIT/ML injection Inject 0-9 Units into the skin 3 (three) times daily with meals. 07/16/19   Katha Hamming, MD  losartan (COZAAR) 25 MG tablet Take 1 tablet (25 mg total) by mouth daily. 05/31/21 08/29/21  Marisue Ivan D, PA-C  metoCLOPramide (REGLAN) 10 MG tablet Take 1 tablet (10 mg total) by mouth 2 (two) times daily as needed for up to 5 days for nausea. 07/29/22 08/03/22  Poggi, Eileen Stanford E, PA-C  metoprolol succinate (TOPROL-XL) 100 MG 24 hr tablet Take 1 tablet (100 mg total) by mouth daily. Take with or immediately following a meal. PLEASE SCHEDULE OFFICE VISIT FOR FURTHER REFILLS. THANK YOU! 11/20/21 02/18/22  End, Cristal Deer, MD  pantoprazole (PROTONIX) 40 MG tablet Take 1 tablet (40 mg total) by mouth daily. PLEASE SCHEDULE OFFICE VISIT FOR FURTHER REFILLS. THANK YOU! 11/20/21 02/18/22  End, Cristal Deer, MD  promethazine (PHENERGAN) 25 MG suppository Place 1 suppository (25 mg total) rectally every 8 (eight) hours as needed for refractory nausea / vomiting. 06/29/21   Shaune Pollack, MD    Inpatient Medications: Scheduled Meds:  aspirin EC  81 mg Oral Daily   atorvastatin  80 mg Oral Daily   Chlorhexidine Gluconate Cloth  6 each Topical Daily   enoxaparin (LOVENOX) injection  40 mg Subcutaneous Q24H   losartan  25 mg Oral Daily   metoprolol succinate  100 mg Oral Daily   pantoprazole  40 mg Oral Daily   sodium chloride flush  3 mL Intravenous Q12H   ticagrelor  90 mg Oral BID   Continuous Infusions:  sodium chloride 75 mL/hr at 07/21/23 0014   sodium chloride     nitroGLYCERIN 20 mcg/min (07/21/23 0013)   tirofiban 0.15 mcg/kg/min (07/21/23 0015)   PRN Meds: sodium chloride, acetaminophen, metoCLOPramide, morphine injection, ondansetron (ZOFRAN) IV, promethazine, sodium chloride flush  Allergies:    Allergies  Allergen Reactions   Quinolones Other (See Comments)    Social History:   Social History   Socioeconomic History   Marital status: Single    Spouse name: Not on file   Number of children: Not on file   Years of education: Not on file   Highest education level: Not on file  Occupational History   Not on file  Tobacco Use   Smoking status: Some Days    Current  packs/day: 0.50    Types: Cigarettes   Smokeless tobacco: Never  Vaping Use   Vaping status: Never Used  Substance and Sexual Activity   Alcohol use: Yes   Drug use: Yes    Types: Marijuana   Sexual activity: Not on file  Other Topics Concern   Not on file  Social History Narrative   Not on file   Social Determinants of Health   Financial Resource Strain: Low Risk  (05/09/2023)   Received from [facility]   Overall Financial Resource Strain (CARDIA)    Difficulty of Paying Living Expenses: Not very hard  Food Insecurity: No Food Insecurity (05/09/2023)   Received from [facility]   Hunger Vital Sign    Worried About Running Out of Food in the Last Year: Never true    Ran Out of Food in the Last Year: Never true  Transportation Needs: No Transportation Needs (05/09/2023)   Received from [facility]   PRAPARE - Transportation    Lack of Transportation (Medical): No    Lack of Transportation (Non-Medical): No  Physical Activity: Not on  file  Stress: Not on file  Social Connections: Not on file  Intimate Partner Violence: Not on file    Family History:    Family History  Problem Relation Age of Onset   Diabetes Mother    Cancer Mother    Heart failure Mother    Hypertension Mother    Heart attack Father    Diabetes Father    Hypertension Father      ROS:  Please see the history of present illness.   All other ROS reviewed and negative.     Physical Exam/Data:   Vitals:   07/20/23 2132 07/20/23 2219 07/20/23 2355 07/21/23 0000  BP: (!) 149/110  (!) 156/117 (!) 156/139  Pulse: (!) 108  93 84  Resp: 18  17 14   Temp: 98.6 F (37 C)  98.1 F (36.7 C)   TempSrc:   Oral   SpO2: 98% 98% 94% 95%  Weight: 108.9 kg     Height: 5\' 6"  (1.676 m)  5\' 6"  (1.676 m)    No intake or output data in the 24 hours ending 07/21/23 0037    07/20/2023    9:32 PM 02/19/2023    2:48 PM 02/08/2023   11:04 AM  Last 3 Weights  Weight (lbs) 240 lb 229 lb 4.5 oz 230 lb   Weight (kg) 108.863 kg 104 kg 104.327 kg     Body mass index is 38.74 kg/m.  General:  Well nourished, well developed, in severe distress due to pain HEENT: normal Neck: no JVD Vascular: No carotid bruits; Distal pulses 2+ bilaterally Cardiac:  normal S1, S2; RRR; no murmur  Lungs:  clear to auscultation bilaterally, no wheezing, rhonchi or rales  Abd: soft, nontender, no hepatomegaly  Ext: no edema Musculoskeletal:  No deformities, BUE and BLE strength normal and equal Skin: warm and dry  Neuro:  CNs 2-12 intact, no focal abnormalities noted Psych:  Normal affect   EKG:  The EKG was personally reviewed and demonstrates: Sinus tachycardia with inferior ST elevation as well as elevation in V4-V6. Telemetry:  Telemetry was personally reviewed and demonstrates:    Relevant CV Studies:   Laboratory Data:  High Sensitivity Troponin:   Recent Labs  Lab 07/20/23 2136  TROPONINIHS 14     Chemistry Recent Labs  Lab 07/20/23 2136  NA 133*  K 4.7  CL 100  CO2 22  GLUCOSE 330*  BUN 12  CREATININE 0.77  CALCIUM 8.9  GFRNONAA >60  ANIONGAP 11    No results for input(s): "PROT", "ALBUMIN", "AST", "ALT", "ALKPHOS", "BILITOT" in the last 168 hours. Lipids  Recent Labs  Lab 07/20/23 2138  CHOL 255*  TRIG 174*  HDL 55  LDLCALC 165*  CHOLHDL 4.6    Hematology Recent Labs  Lab 07/20/23 2136  WBC 14.2*  RBC 5.51*  HGB 12.3  HCT 40.9  MCV 74.2*  MCH 22.3*  MCHC 30.1  RDW 16.5*  PLT 479*   Thyroid No results for input(s): "TSH", "FREET4" in the last 168 hours.  BNPNo results for input(s): "BNP", "PROBNP" in the last 168 hours.  DDimer No results for input(s): "DDIMER" in the last 168 hours.   Radiology/Studies:  CARDIAC CATHETERIZATION  Result Date: 07/20/2023   Prox RCA to Mid RCA lesion is 30% stenosed.   RPDA lesion is 50% stenosed.   Dist Cx lesion is 90% stenosed.   3rd Mrg lesion is 70% stenosed.   Mid LAD lesion is 30% stenosed.   1st Diag  lesion is  100% stenosed.   2nd Diag lesion is 100% stenosed.   Previously placed Ost LAD to Mid LAD stent of unknown type is  widely patent.   A drug-eluting stent was successfully placed using a STENT ONYX FRONTIER 2.5X18.   A drug-eluting stent was successfully placed using a STENT ONYX FRONTIER 2.0X15.   Post intervention, there is a 0% residual stenosis.   Post intervention, there is a 0% residual stenosis.   There is mild to moderate left ventricular systolic dysfunction.   LV end diastolic pressure is moderately elevated.   The left ventricular ejection fraction is 35-45% by visual estimate. 1.  Patent LAD and RCA stents with mild in-stent restenosis.  Chronically occluded first and second diagonal.  80 to 90% hazy stenosis in the distal left circumflex at the bifurcation of an OM branch seems to be the culprit for inferior/inferolateral ST elevation myocardial infarction. 2.  Mildly to moderately reduced LV systolic function with moderately elevated left ventricular end-diastolic pressure. 3.  Successful complex bifurcation angioplasty utilizing the reverse mini crush technique to the distal left circumflex/OM bifurcation.  Initially, a stent was placed in the distal left circumflex but there was loss of flow in the OM branch and thus I elected to treat given that the patient was highly symptomatic. Recommendations: Dual antiplatelet therapy for at least 12 months and preferably indefinitely. Aggrastat infusion for 12 hours given risk of stent thrombosis in the setting of vomiting. Nitroglycerin drip for pain control. Check urine drug screen. Aggressive treatment of risk factors and smoking cessation.     Assessment and Plan:   Inferior/inferolateral ST elevation myocardial infarction: Emergent cardiac catheterization done via the right radial artery showed patent stents in the LAD and RCA with mild restenosis.  The first and second diagonals appeared to be occluded but these were described before.  I felt that  the culprit was hazy stenosis in the distal left circumflex at the bifurcation of a large OM branch.  Initially a stent was placed to the distal left circumflex but there was loss of flow in the OM branch and thus I had to perform bifurcation stenting (reverse mini crush technique).  There was excellent angiographic results at the end.  Will continue Aggrastat infusion for 12 hours especially in the setting of nausea and vomiting.  Continue dual antiplatelet therapy with aspirin and ticagrelor for at least 12 months and preferably indefinitely.  Will use nitroglycerin drip for pain control. Acute on chronic systolic heart failure due to myocardial infarction: Continue beta-blocker and ARB.  Check an echocardiogram. Tobacco use: I strongly advised her to quit smoking and discussed with her significant other.   Risk Assessment/Risk Scores:     TIMI Risk Score for ST  Elevation MI:   The patient's TIMI risk score is 3, which indicates a 4.4% risk of all cause mortality at 30 days.{  For questions or updates, please contact [facility] Please consult [email or web address] for contact info under    Signed, Lorine Bears, MD  07/20/2023 11:29 PM

## 2023-07-21 NOTE — Assessment & Plan Note (Addendum)
Blood pressure within goal -Continue Toprol and losartan

## 2023-07-21 NOTE — Assessment & Plan Note (Addendum)
S/P PCI and 2 stents placement by Dr. Kirke Corin  No chest pain or shortness of breath this morning. -12 hours of Aggrastat -Continue DAPT with aspirin and Brilinta for minimum of 12 months but preferably indefinitely -Counseling was provided for smoking -Better control of risk factors which include smoking, hypertension in a lady with strong family history of cardiac disease.

## 2023-07-21 NOTE — H&P (Signed)
[place]   PATIENT NAME: Karen Ruiz    MR#:  [id number]  DATE OF BIRTH:  01-Oct-1981  DATE OF ADMISSION:  07/20/2023  PRIMARY CARE PHYSICIAN: Dan Humphreys, MD   Patient is coming from: Home  REQUESTING/REFERRING PHYSICIAN: Karlene Einstein, MD  CHIEF COMPLAINT:   Chief Complaint  Patient presents with   Chest Pain   Code STEMI    HISTORY OF PRESENT ILLNESS:  Karen Ruiz is a 42 y.o. female with medical history significant for type diabetes mellitus and diabetic gastroparesis, history of cocaine abuse, migraine, coronary artery disease s/p MI in 2022 s/p PCI and DES in the RCA and another stent in the proximal/mid LAD with [facility] cardiolog, who presented to the emergency room with acute onset of midsternal chest pain while she was taking a shower about an hour prior to arrival to the ED.  Her pain was severe and crushing as well as burning and has been constant with no exacerbating or relieving factors.  No associated nausea or vomiting.  She denies any cough or wheezing or hemoptysis.  No recent fever or chills.  She denies any associated palpitations or dyspnea.  She took 3 sprays of sublingual nitroglycerin and loading dose of aspirin by EMS with mild improvement of her pain.  She came to the ED as a code STEMI and Dr. Kirke Corin was notified.  She was taken to the Cath Lab and underwent PCI and stent of her LCA and its branch.   ED Course: When she came to the ER, BP was 156/117 with heart rate 102 and pulse 79 4% on 2 L O2 nasal cannula with an show 98.1 respiratory rate of 17.  Labs revealed mild hyponatremia 133 and hyperglycemia of 330, total cholesterol 255 HDL 55 and LDL 165 triglycerides 174, high-sensitivity troponin I of 14 and later 22 and CBC showed leukocytosis of 14.2 with thrombocytosis and microcytosis.  Coag profile was within normal. EKG as reviewed by me : Initial EKG showed sinus tachycardia with rate 112 with ST segment elevation inferiorly and  to less extent laterally with Q waves laterally and ST segment depression anteroseptally.Post cath EKG showed sinus rhythm with rate of 85 with incomplete right bundle branch block and low voltage QRS with resolution of ST segment elevation.  The patient is being admitted to the ICU for further evaluation and management. PAST MEDICAL HISTORY:   Past Medical History:  Diagnosis Date   Barrett esophagus    Diabetes mellitus without complication (HCC)    Gastroparesis    Migraines    MRSA (methicillin resistant staph aureus) culture positive    2009   Obesity     PAST SURGICAL HISTORY:   Past Surgical History:  Procedure Laterality Date   CARDIAC CATHETERIZATION     CESAREAN SECTION     CORONARY STENT INTERVENTION N/A 05/02/2021   Procedure: CORONARY STENT INTERVENTION;  Surgeon: Yvonne Kendall, MD;  Location: [facility] INVASIVE CV LAB;  Service: Cardiovascular;  Laterality: N/A;   CORONARY ULTRASOUND/IVUS N/A 05/02/2021   Procedure: Intravascular Ultrasound/IVUS;  Surgeon: Yvonne Kendall, MD;  Location: [facility] INVASIVE CV LAB;  Service: Cardiovascular;  Laterality: N/A;   ESOPHAGOGASTRODUODENOSCOPY N/A 07/28/2019   Procedure: ESOPHAGOGASTRODUODENOSCOPY (EGD);  Surgeon: Toney Reil, MD;  Location: [facility] ENDOSCOPY;  Service: Gastroenterology;  Laterality: N/A;   LEFT HEART CATH AND CORONARY ANGIOGRAPHY N/A 05/02/2021   Procedure: LEFT HEART CATH AND CORONARY ANGIOGRAPHY;  Surgeon: Yvonne Kendall, MD;  Location: [facility] INVASIVE CV  LAB;  Service: Cardiovascular;  Laterality: N/A;    SOCIAL HISTORY:   Social History   Tobacco Use   Smoking status: Some Days    Current packs/day: 0.50    Types: Cigarettes   Smokeless tobacco: Never  Substance Use Topics   Alcohol use: Yes    FAMILY HISTORY:   Family History  Problem Relation Age of Onset   Diabetes Mother    Cancer Mother    Heart failure Mother    Hypertension Mother    Heart attack Father    Diabetes Father     Hypertension Father     DRUG ALLERGIES:   Allergies  Allergen Reactions   Quinolones Other (See Comments)    REVIEW OF SYSTEMS:   ROS As per history of present illness. All pertinent systems were reviewed above. Constitutional, HEENT, cardiovascular, respiratory, GI, GU, musculoskeletal, neuro, psychiatric, endocrine, integumentary and hematologic systems were reviewed and are otherwise negative/unremarkable except for positive findings mentioned above in the HPI.   MEDICATIONS AT HOME:   Prior to Admission medications   Medication Sig Start Date End Date Taking? Authorizing Provider  aspirin EC 81 MG EC tablet Take 1 tablet (81 mg total) by mouth daily. Swallow whole. 05/04/21   Delfino Lovett, MD  atorvastatin (LIPITOR) 80 MG tablet Take 1 tablet (80 mg total) by mouth daily. 05/31/21 08/29/21  Marisue Ivan D, PA-C  doxycycline (VIBRA-TABS) 100 MG tablet Take 1 tablet (100 mg total) by mouth 2 (two) times daily. 01/10/23   Lenn Sink, DPM  escitalopram (LEXAPRO) 5 MG tablet Take by mouth daily. 06/12/21 09/10/21  [provider]  insulin aspart (NOVOLOG) 100 UNIT/ML injection Inject 0-9 Units into the skin 3 (three) times daily with meals. 07/16/19   Katha Hamming, MD  losartan (COZAAR) 25 MG tablet Take 1 tablet (25 mg total) by mouth daily. 05/31/21 08/29/21  Marisue Ivan D, PA-C  metoCLOPramide (REGLAN) 10 MG tablet Take 1 tablet (10 mg total) by mouth 2 (two) times daily as needed for up to 5 days for nausea. 07/29/22 08/03/22  Poggi, Eileen Stanford E, PA-C  metoprolol succinate (TOPROL-XL) 100 MG 24 hr tablet Take 1 tablet (100 mg total) by mouth daily. Take with or immediately following a meal. PLEASE SCHEDULE OFFICE VISIT FOR FURTHER REFILLS. THANK YOU! 11/20/21 02/18/22  End, Cristal Deer, MD  pantoprazole (PROTONIX) 40 MG tablet Take 1 tablet (40 mg total) by mouth daily. PLEASE SCHEDULE OFFICE VISIT FOR FURTHER REFILLS. THANK YOU! 11/20/21 02/18/22  End, Cristal Deer, MD   promethazine (PHENERGAN) 25 MG suppository Place 1 suppository (25 mg total) rectally every 8 (eight) hours as needed for refractory nausea / vomiting. 06/29/21   Shaune Pollack, MD      VITAL SIGNS:  Blood pressure (!) 129/105, pulse 88, temperature 98.1 F (36.7 C), temperature source Oral, resp. rate 14, height 5\' 6"  (1.676 m), weight 108.9 kg, SpO2 99%.  PHYSICAL EXAMINATION:  Physical Exam  GENERAL:  42 y.o.-year-old patient lying in the bed with no acute distress.  EYES: Pupils equal, round, reactive to light and accommodation. No scleral icterus. Extraocular muscles intact.  HEENT: Head atraumatic, normocephalic. Oropharynx and nasopharynx clear.  NECK:  Supple, no jugular venous distention. No thyroid enlargement, no tenderness.  LUNGS: Normal breath sounds bilaterally, no wheezing, rales,rhonchi or crepitation. No use of accessory muscles of respiration.  CARDIOVASCULAR: Regular rate and rhythm, S1, S2 normal. No murmurs, rubs, or gallops.  ABDOMEN: Soft, nondistended, nontender. Bowel sounds present. No organomegaly or mass.  EXTREMITIES: No pedal edema, cyanosis, or clubbing.  NEUROLOGIC: Cranial nerves II through XII are intact. Muscle strength 5/5 in all extremities. Sensation intact. Gait not checked.  PSYCHIATRIC: The patient is alert and oriented x 3.  Normal affect and good eye contact. SKIN: No obvious rash, lesion, or ulcer.   LABORATORY PANEL:   CBC Recent Labs  Lab 07/20/23 2136  WBC 14.2*  HGB 12.3  HCT 40.9  PLT 479*   ------------------------------------------------------------------------------------------------------------------  Chemistries  Recent Labs  Lab 07/20/23 2136  NA 133*  K 4.7  CL 100  CO2 22  GLUCOSE 330*  BUN 12  CREATININE 0.77  CALCIUM 8.9   ------------------------------------------------------------------------------------------------------------------  Cardiac Enzymes No results for input(s): "TROPONINI" in the last  168 hours. ------------------------------------------------------------------------------------------------------------------  RADIOLOGY:  CARDIAC CATHETERIZATION  Result Date: 07/20/2023   Prox RCA to Mid RCA lesion is 30% stenosed.   RPDA lesion is 50% stenosed.   Dist Cx lesion is 90% stenosed.   3rd Mrg lesion is 70% stenosed.   Mid LAD lesion is 30% stenosed.   1st Diag lesion is 100% stenosed.   2nd Diag lesion is 100% stenosed.   Previously placed Ost LAD to Mid LAD stent of unknown type is  widely patent.   A drug-eluting stent was successfully placed using a STENT ONYX FRONTIER 2.5X18.   A drug-eluting stent was successfully placed using a STENT ONYX FRONTIER 2.0X15.   Post intervention, there is a 0% residual stenosis.   Post intervention, there is a 0% residual stenosis.   There is mild to moderate left ventricular systolic dysfunction.   LV end diastolic pressure is moderately elevated.   The left ventricular ejection fraction is 35-45% by visual estimate. 1.  Patent LAD and RCA stents with mild in-stent restenosis.  Chronically occluded first and second diagonal.  80 to 90% hazy stenosis in the distal left circumflex at the bifurcation of an OM branch seems to be the culprit for inferior/inferolateral ST elevation myocardial infarction. 2.  Mildly to moderately reduced LV systolic function with moderately elevated left ventricular end-diastolic pressure. 3.  Successful complex bifurcation angioplasty utilizing the reverse mini crush technique to the distal left circumflex/OM bifurcation.  Initially, a stent was placed in the distal left circumflex but there was loss of flow in the OM branch and thus I elected to treat given that the patient was highly symptomatic. Recommendations: Dual antiplatelet therapy for at least 12 months and preferably indefinitely. Aggrastat infusion for 12 hours given risk of stent thrombosis in the setting of vomiting. Nitroglycerin drip for pain control. Check urine  drug screen. Aggressive treatment of risk factors and smoking cessation.      IMPRESSION AND PLAN:  Assessment and Plan: Acute ST elevation myocardial infarction (STEMI) involving other coronary artery of inferior wall [facility]) - The patient is admitted to the ICU for further evaluation and management. - The patient is status post PCI and 2 stents placement by Dr. Kirke Corin as mentioned above. - She will be continued on IV Aggrastat for 12 hours. - She will be continued on Brilinta, high-dose statin therapy and beta-blocker therapy. - She will be on aspirin as well as as needed sublingual nitroglycerin and IV morphine sulfate for pain. - Cardiology follow-up consult will be obtained.  Essential hypertension - We will continue her antihypertensive therapy.  Diabetic gastroparesis associated with type 2 diabetes mellitus (HCC) - Will be placed on scheduled IV Reglan. - Will place on supplemental coverage with NovoLog. - We will continue  basal coverage.  Dyslipidemia - The patient will be placed on high-dose statin therapy.       DVT prophylaxis: She is on IV Aggrastat for the next 12 hours. Advanced Care Planning:  Code Status: full code. Family Communication:  The plan of care was discussed in details with the patient (and family). I answered all questions. The patient agreed to proceed with the above mentioned plan. Further management will depend upon hospital course. Disposition Plan: Back to previous home environment Consults called: Cardiology All the records are reviewed and case discussed with ED provider.  Status is: Inpatient  At the time of the admission, it appears that the appropriate admission status for this patient is inpatient.  This is judged to be reasonable and necessary in order to provide the required intensity of service to ensure the patient's safety given the presenting symptoms, physical exam findings and initial radiographic and laboratory data in the context  of comorbid conditions.  The patient requires inpatient status due to high intensity of service, high risk of further deterioration and high frequency of surveillance required.  I certify that at the time of admission, it is my clinical judgment that the patient will require inpatient hospital care extending more than 2 midnights.                            Dispo: The patient is from: Home              Anticipated d/c is to: Home              Patient currently is not medically stable to d/c.              Difficult to place patient: No  Hannah Beat M.D on 07/21/2023 at 3:22 AM  [facility]   From 7 PM-7 AM, contact night-coverage [email or web address]  CC: Primary care physician; Dan Humphreys, MD

## 2023-07-21 NOTE — Inpatient Diabetes Management (Signed)
Inpatient Diabetes Program Recommendations  AACE/ADA: New Consensus Statement on Inpatient Glycemic Control (2015)  Target Ranges:  Prepandial:   less than 140 mg/dL      Peak postprandial:   less than 180 mg/dL (1-2 hours)      Critically ill patients:  140 - 180 mg/dL   Lab Results  Component Value Date   GLUCAP 143 (H) 07/21/2023   HGBA1C 12.2 (H) 02/20/2023    Review of Glycemic Control  Latest Reference Range & Units 07/20/23 23:56 07/21/23 02:37 07/21/23 04:05 07/21/23 07:40  Glucose-Capillary 70 - 99 mg/dL 409 (H) 811 (H)  Novolog 20 units 384 (H)  Novolog 20 units 143 (H)  (H): Data is abnormally high  Diabetes history: DM2 Outpatient Diabetes medications: Novolog 0-9 units TID Current orders for Inpatient glycemic control: Novolog 0-15 units TID and 0-5 units QHS  Inpatient Diabetes Program Recommendations:    Referral received for elevated glucose post PCI.  Initial glucose was 351 mg/dL.  Received 40 units within 1 1/2 hrs o/n.  Fasting is 143 mg/dL.  RN follow closely for s&s of hypoglycemia this morning.  Once glucose is stable might consider:  Semglee 16 units every day (0.15 units x 108.9 kg).  Will continue to follow while inpatient.  Thank you, Dulce Sellar, MSN, CDCES Diabetes Coordinator Inpatient Diabetes Program [phone] (team pager from 8a-5p)

## 2023-07-21 NOTE — Progress Notes (Signed)
  Echocardiogram 2D Echocardiogram has been performed.  Crisanto Nied C Susana Duell 07/21/2023, 11:15 AM

## 2023-07-22 ENCOUNTER — Encounter: Payer: Self-pay | Admitting: Cardiovascular Disease

## 2023-07-22 DIAGNOSIS — I2510 Atherosclerotic heart disease of native coronary artery without angina pectoris: Secondary | ICD-10-CM | POA: Diagnosis not present

## 2023-07-22 DIAGNOSIS — I5021 Acute systolic (congestive) heart failure: Secondary | ICD-10-CM | POA: Diagnosis not present

## 2023-07-22 DIAGNOSIS — I10 Essential (primary) hypertension: Secondary | ICD-10-CM | POA: Diagnosis not present

## 2023-07-22 DIAGNOSIS — I5023 Acute on chronic systolic (congestive) heart failure: Secondary | ICD-10-CM | POA: Diagnosis not present

## 2023-07-22 DIAGNOSIS — I2119 ST elevation (STEMI) myocardial infarction involving other coronary artery of inferior wall: Secondary | ICD-10-CM | POA: Diagnosis not present

## 2023-07-22 LAB — CBC
HCT: 34.1 % — ABNORMAL LOW (ref 36.0–46.0)
Hemoglobin: 10.5 g/dL — ABNORMAL LOW (ref 12.0–15.0)
MCH: 22.7 pg — ABNORMAL LOW (ref 26.0–34.0)
MCHC: 30.8 g/dL (ref 30.0–36.0)
MCV: 73.8 fL — ABNORMAL LOW (ref 80.0–100.0)
Platelets: 308 10*3/uL (ref 150–400)
RBC: 4.62 MIL/uL (ref 3.87–5.11)
RDW: 16.4 % — ABNORMAL HIGH (ref 11.5–15.5)
WBC: 9.8 10*3/uL (ref 4.0–10.5)
nRBC: 0 % (ref 0.0–0.2)

## 2023-07-22 LAB — BASIC METABOLIC PANEL
Anion gap: 9 (ref 5–15)
BUN: 12 mg/dL (ref 6–20)
CO2: 23 mmol/L (ref 22–32)
Calcium: 8.3 mg/dL — ABNORMAL LOW (ref 8.9–10.3)
Chloride: 101 mmol/L (ref 98–111)
Creatinine, Ser: 0.52 mg/dL (ref 0.44–1.00)
GFR, Estimated: >60 mL/min (ref >60)
Glucose, Bld: 232 mg/dL — ABNORMAL HIGH (ref 70–99)
Potassium: 3.7 mmol/L (ref 3.5–5.1)
Sodium: 133 mmol/L — ABNORMAL LOW (ref 135–145)

## 2023-07-22 LAB — GLUCOSE, CAPILLARY: Glucose-Capillary: 250 mg/dL — ABNORMAL HIGH (ref 70–99)

## 2023-07-22 MED ORDER — METOPROLOL SUCCINATE ER 100 MG PO TB24: 100.0000 mg | ORAL_TABLET | Freq: Every day | ORAL | 2 refills | Status: DC

## 2023-07-22 MED ORDER — PANTOPRAZOLE SODIUM 40 MG PO TBEC: 40.0000 mg | DELAYED_RELEASE_TABLET | Freq: Every day | ORAL | 1 refills | Status: AC

## 2023-07-22 MED ORDER — ATORVASTATIN CALCIUM 80 MG PO TABS: 80.0000 mg | ORAL_TABLET | Freq: Every day | ORAL | 1 refills | Status: AC

## 2023-07-22 MED ORDER — TICAGRELOR 90 MG PO TABS: 90.0000 mg | ORAL_TABLET | Freq: Two times a day (BID) | ORAL | 11 refills | Status: DC

## 2023-07-22 MED ORDER — ESCITALOPRAM OXALATE 10 MG PO TABS: 10.0000 mg | ORAL_TABLET | Freq: Every day | ORAL | 2 refills | Status: AC

## 2023-07-22 NOTE — Progress Notes (Signed)
   Patient Name: Karen Ruiz [id number] Date of Encounter: 07/22/2023 [facility] Cardiologist: None  She follows with [facility] cardiology  Interval Summary  .    She is feeling well with no chest pain or shortness of breath.  She has chronic abdominal pain related to gastroparesis.  Vital Signs .    Vitals:   07/22/23 0500 07/22/23 0600 07/22/23 0700 07/22/23 0800  BP: (!) 148/66 139/81 (!) 131/119 124/73  Pulse: 76 81 86 92  Resp: 14 12 16  (!) 27  Temp:      TempSrc:      SpO2: 95% 95% 98% 98%  Weight:      Height:        Intake/Output Summary (Last 24 hours) at 07/22/2023 0848 Last data filed at 07/22/2023 0801 Gross per 24 hour  Intake 748.05 ml  Output 2750 ml  Net -2001.95 ml      07/20/2023    9:32 PM 02/19/2023    2:48 PM 02/08/2023   11:04 AM  Last 3 Weights  Weight (lbs) 240 lb 229 lb 4.5 oz 230 lb  Weight (kg) 108.863 kg 104 kg 104.327 kg      Telemetry/ECG    Sinus with rates of 80-90 - Personally Reviewed  Physical Exam .   GEN: No acute distress.   Neck: No JVD Cardiac: RRR, no murmurs, rubs, or gallops.  Right radial cath site with gauze and OpSite dressing clean dry and intact, no bleeding, bruising, or hematoma noted, 2+ radial pulse Respiratory: Clear to auscultation bilaterally.  Respirations are unlabored at rest on room air GI: Soft, nontender, non-distended  MS: No edema  Assessment & Plan .     Inferior/inferolateral STEMI -patient presented with chest pain and underwent emergency catheterization -successful bifurcation stenting of the dLCX and OM -Right radial cath site without bleeding bruising or hematoma Recommend dual antiplatelet therapy for at least 1 year and preferably indefinitely given multiple stents. She was referred to cardiac rehab.   Acute on chronic systolic congestive heart failure: Initial EF was mildly reduced by left ventricular angiography but normal by echocardiogram.  She appears to be euvolemic.  Continue  Toprol and losartan.   Dyslipidemia -Continue atorvastatin 80 mg daily -LDL 165 -LP(a) pending She does not always take her cholesterol medication due to her GI issues and given her extensive cardiac history, I suspect that she will require treatment with a PCSK9 inhibitor.  Hypertension -Blood pressure is well-controlled on current medications.  Type 2 diabetes -Continued on insulin therapy -Continue management per IM  Tobacco abuse, remote cocaine use -Total cessation is recommended -Urine drug screen was negative for cocaine.  It was positive for opiates and benzodiazepines but these were given to the patient during cath PCI procedure.   The patient can be discharged home from a cardiac standpoint. I asked her to schedule a follow-up appointment with [facility] cardiology within 1 to 2 weeks.  I provided her with my contact information if she wishes to follow-up locally.  Total encounter time more than 50 minutes. Greater than 50% was spent in counseling and coordination of care with the patient.   For questions or updates, please contact [facility] Please consult [email or web address] for contact info under        Signed, Lorine Bears, MD

## 2023-07-22 NOTE — Plan of Care (Signed)
  Problem: Education: Goal: Knowledge of General Education information will improve Description: Including pain rating scale, medication(s)/side effects and non-pharmacologic comfort measures Outcome: Adequate for Discharge   Problem: Health Behavior/Discharge Planning: Goal: Ability to manage health-related needs will improve Outcome: Adequate for Discharge   Problem: Clinical Measurements: Goal: Ability to maintain clinical measurements within normal limits will improve Outcome: Adequate for Discharge Goal: Will remain free from infection Outcome: Adequate for Discharge Goal: Diagnostic test results will improve Outcome: Adequate for Discharge Goal: Respiratory complications will improve Outcome: Adequate for Discharge Goal: Cardiovascular complication will be avoided Outcome: Adequate for Discharge   Problem: Activity: Goal: Risk for activity intolerance will decrease Outcome: Adequate for Discharge   Problem: Nutrition: Goal: Adequate nutrition will be maintained Outcome: Adequate for Discharge   Problem: Coping: Goal: Level of anxiety will decrease Outcome: Adequate for Discharge   Problem: Elimination: Goal: Will not experience complications related to bowel motility Outcome: Adequate for Discharge Goal: Will not experience complications related to urinary retention Outcome: Adequate for Discharge   Problem: Pain Managment: Goal: General experience of comfort will improve Outcome: Adequate for Discharge   Problem: Safety: Goal: Ability to remain free from injury will improve Outcome: Adequate for Discharge   Problem: Skin Integrity: Goal: Risk for impaired skin integrity will decrease Outcome: Adequate for Discharge   Problem: Education: Goal: Understanding of CV disease, CV risk reduction, and recovery process will improve Outcome: Adequate for Discharge Goal: Individualized Educational Video(s) Outcome: Adequate for Discharge   Problem:  Activity: Goal: Ability to return to baseline activity level will improve Outcome: Adequate for Discharge   Problem: Cardiovascular: Goal: Ability to achieve and maintain adequate cardiovascular perfusion will improve Outcome: Adequate for Discharge Goal: Vascular access site(s) Level 0-1 will be maintained Outcome: Adequate for Discharge   Problem: Health Behavior/Discharge Planning: Goal: Ability to safely manage health-related needs after discharge will improve Outcome: Adequate for Discharge   Problem: Education: Goal: Ability to describe self-care measures that may prevent or decrease complications (Diabetes Survival Skills Education) will improve Outcome: Adequate for Discharge Goal: Individualized Educational Video(s) Outcome: Adequate for Discharge   Problem: Coping: Goal: Ability to adjust to condition or change in health will improve Outcome: Adequate for Discharge   Problem: Fluid Volume: Goal: Ability to maintain a balanced intake and output will improve Outcome: Adequate for Discharge   Problem: Health Behavior/Discharge Planning: Goal: Ability to identify and utilize available resources and services will improve Outcome: Adequate for Discharge Goal: Ability to manage health-related needs will improve Outcome: Adequate for Discharge   Problem: Metabolic: Goal: Ability to maintain appropriate glucose levels will improve Outcome: Adequate for Discharge   Problem: Nutritional: Goal: Maintenance of adequate nutrition will improve Outcome: Adequate for Discharge Goal: Progress toward achieving an optimal weight will improve Outcome: Adequate for Discharge   Problem: Skin Integrity: Goal: Risk for impaired skin integrity will decrease Outcome: Adequate for Discharge   Problem: Tissue Perfusion: Goal: Adequacy of tissue perfusion will improve Outcome: Adequate for Discharge

## 2023-07-22 NOTE — Progress Notes (Signed)
AVS reviewed with patient using teach-back method, including medications, pharmacy pick up location, diet, and follow-up appointments/ referrals.  Patient voiced understanding.  Patient attempting to reach spouse regarding discharge.  Patient will notify staffing when spouse is reached and arrives at hospital for pickup.  Patient alert and oriented x 4 and on room air. Vital signs stable.

## 2023-07-22 NOTE — Progress Notes (Signed)
Transition of Care [facility]) - Inpatient Brief Assessment   Patient Details  Name: GANELL GALANOS MRN: [id number] Date of Birth: August 29, 1981  Transition of Care [facility]) CM/SW Contact:    Truddie Hidden, RN Phone Number: 07/22/2023, 10:54 AM   Clinical Narrative: TOC continuing to follow patient's progress throughout discharge planning.   Transition of Care Asessment: Insurance and Status: Insurance coverage has been reviewed Patient has primary care physician: Yes Home environment has been reviewed: Home Prior level of function:: independent Prior/Current Home Services: No current home services Social Determinants of Health Reivew: SDOH reviewed no interventions necessary Readmission risk has been reviewed: Yes Transition of care needs: no transition of care needs at this time

## 2023-07-22 NOTE — Discharge Summary (Signed)
Physician Discharge Summary   Patient: Karen Ruiz MRN: [id number] DOB: 03-08-1981  Admit date:     07/20/2023  Discharge date: 07/22/23  Discharge Physician: Arnetha Courser   PCP: Dan Humphreys, MD   Recommendations at discharge:  Please obtain CBC and BMP on follow-up Follow-up with cardiology within a week Follow-up with primary care provider  Discharge Diagnoses: Principal Problem:   Acute ST elevation myocardial infarction (STEMI) involving other coronary artery of inferior wall (HCC) Active Problems:   ST elevation myocardial infarction (STEMI) (HCC)   Acute HFrEF (heart failure with reduced ejection fraction) (HCC)   Essential hypertension   Diabetic gastroparesis associated with type 2 diabetes mellitus (HCC)   Dyslipidemia   Hospital Course: Taken from H&P.  Karen Ruiz is a 42 y.o. female with medical history significant for type diabetes mellitus and diabetic gastroparesis, history of cocaine abuse, migraine, coronary artery disease s/p MI in 2022 s/p PCI and DES in the RCA and another stent in the proximal/mid LAD with [facility] cardiolog, who presented to the emergency room with acute onset of midsternal chest pain while she was taking a shower about an hour prior to arrival to the ED.   On presentation to ED, blood pressure elevated at 156/117, HR 102, labs with mild leukocytosis of 14.2, troponin 14>>22.  EKG with concern of STEMI in inferolateral leads with reciprocal ST depression.  Code STEMI was activated and patient was emergently taken to Cath Lab s/p stents to her LCA and its branch.  Cath shows patent stents in LAD and RCA with mild restenosis.  Patient was later admitted to ICU.  Cardiology recommendations are DAPT with aspirin and ticagrelor for at least 12 months and preferably indefinitely. She was also started on nitroglycerin drip for pain control. Also had acute on chronic systolic heart failure due to MI, pending echocardiogram.  10/20:  Vital stable.  Overnight elevated blood glucose level, troponin this morning at 278, improving leukocytosis at 12, hemoglobin decreased to 10.7 from 12, UDS positive for amphetamine, opioids and cannabinoid.  Lipid panel with hyperlipidemia, LDL of 168. Echocardiogram done-pending results.  Cardiology is recommending escalation of GDMT after echocardiogram results, currently continuing beta-blocker and ARB.  10/21: Hemodynamically stable.  Echocardiogram normal with no regional wall motion abnormalities.  Labs stable, mild pseudohyponatremia secondary to hyperglycemia.  Hemoglobin stable at 10.5.  Patient remained chest pain-free, able to ambulate without any difficulty.  She is being discharged on aspirin and Brilinta, likely will need indefinitely and need to have a close follow-up with her cardiologist for further recommendations and management.   Assessment and Plan: * Acute ST elevation myocardial infarction (STEMI) involving other coronary artery of inferior wall (HCC) S/P PCI and 2 stents placement by Dr. Kirke Corin  No chest pain or shortness of breath this morning. -12 hours of Aggrastat -Continue DAPT with aspirin and Brilinta for minimum of 12 months but preferably indefinitely -Counseling was provided for smoking -Better control of risk factors which include smoking, hypertension in a lady with strong family history of cardiac disease.  Acute HFrEF (heart failure with reduced ejection fraction) (HCC) Prior echocardiogram with normal EF and grade 1 diastolic dysfunction.  EF during cardiac cath was noted as 35 to 45%, likely due to current STEMI. Postprocedure echocardiogram was normal, no regional wall motion abnormalities. -Continue beta-blocker and ARB  Essential hypertension Blood pressure within goal -Continue Toprol and losartan  Diabetic gastroparesis associated with type 2 diabetes mellitus (HCC) Pending A1c. -Continue home as needed  Reglan -Continue with  SSI  Dyslipidemia -Continue statin   Consultants: Cardiology Procedures performed: Cardiac catheterization with PCI Disposition: Home Diet recommendation:  Discharge Diet Orders (From admission, onward)     Start     Ordered   07/22/23 0000  Diet - low sodium heart healthy        07/22/23 1048           Cardiac diet DISCHARGE MEDICATION: Allergies as of 07/22/2023       Reactions   Quinolones Other (See Comments)        Medication List     STOP taking these medications    doxycycline 100 MG tablet Commonly known as: VIBRA-TABS       TAKE these medications    aspirin EC 81 MG tablet Take 1 tablet (81 mg total) by mouth daily. Swallow whole.   atorvastatin 80 MG tablet Commonly known as: LIPITOR Take 1 tablet (80 mg total) by mouth daily.   escitalopram 10 MG tablet Commonly known as: LEXAPRO Take 1 tablet (10 mg total) by mouth daily. What changed:  medication strength how much to take   insulin aspart 100 UNIT/ML injection Commonly known as: novoLOG Inject 0-9 Units into the skin 3 (three) times daily with meals.   losartan 25 MG tablet Commonly known as: COZAAR Take 1 tablet (25 mg total) by mouth daily.   metoCLOPramide 10 MG tablet Commonly known as: REGLAN Take 1 tablet (10 mg total) by mouth 2 (two) times daily as needed for up to 5 days for nausea.   metoprolol succinate 100 MG 24 hr tablet Commonly known as: TOPROL-XL Take 1 tablet (100 mg total) by mouth daily. Take with or immediately following a meal. PLEASE SCHEDULE OFFICE VISIT FOR FURTHER REFILLS. THANK YOU!   pantoprazole 40 MG tablet Commonly known as: PROTONIX Take 1 tablet (40 mg total) by mouth daily. PLEASE SCHEDULE OFFICE VISIT FOR FURTHER REFILLS. THANK YOU!   promethazine 25 MG suppository Commonly known as: PHENERGAN Place 1 suppository (25 mg total) rectally every 8 (eight) hours as needed for refractory nausea / vomiting.   ticagrelor 90 MG Tabs  tablet Commonly known as: BRILINTA Take 1 tablet (90 mg total) by mouth 2 (two) times daily.        Follow-up Information     Dan Humphreys, MD. Schedule an appointment as soon as possible for a visit in 1 week(s).   Specialty: Family Medicine Contact information: [address] [place] [address] [phone]         Iran Ouch, MD. Schedule an appointment as soon as possible for a visit in 1 week(s).   Specialty: Cardiology Contact information: [address] [place] [address] [phone]                Discharge Exam: Ceasar Mons Weights   07/20/23 2132  Weight: 108.9 kg   General.  Obese lady, in no acute distress. Pulmonary.  Lungs clear bilaterally, normal respiratory effort. CV.  Regular rate and rhythm, no JVD, rub or murmur. Abdomen.  Soft, nontender, nondistended, BS positive. CNS.  Alert and oriented .  No focal neurologic deficit. Extremities.  No edema, no cyanosis, pulses intact and symmetrical. Psychiatry.  Judgment and insight appears normal.   Condition at discharge: stable  The results of significant diagnostics from this hospitalization (including imaging, microbiology, ancillary and laboratory) are listed below for reference.   Imaging Studies: ECHOCARDIOGRAM COMPLETE  Result Date: 07/21/2023  ECHOCARDIOGRAM REPORT   Patient Name:   FRANCELIA WAUGAMAN Date of Exam: 07/21/2023 Medical Rec #:  [id number]            Height:       66.0 in Accession #:    [id number]           Weight:       240.0 lb Date of Birth:  Dec 07, 1980            BSA:          2.161 m Patient Age:    42 years             BP:           149/110 mmHg Patient Gender: F                    HR:           79 bpm. Exam Location:  [facility] Procedure: 2D Echo, Cardiac Doppler, Color Doppler and Strain Analysis Indications:     Acute myocardial infarction, unspecified I21.9  History:         Patient has prior history of Echocardiogram examinations. Risk                   Factors:Diabetes.  Sonographer:     Neysa Bonito Roar Referring Phys:  [id number] QIONGEXB A ARIDA Diagnosing Phys: Debbe Odea MD  Sonographer Comments: Global longitudinal strain was attempted. IMPRESSIONS  1. Left ventricular ejection fraction, by estimation, is 55 to 60%. The left ventricle has normal function. The left ventricle has no regional wall motion abnormalities. Left ventricular diastolic parameters were normal. The average left ventricular global longitudinal strain is -16.9 %. The global longitudinal strain is normal.  2. Right ventricular systolic function is normal. The right ventricular size is normal.  3. The mitral valve is normal in structure. No evidence of mitral valve regurgitation.  4. The aortic valve was not well visualized. Aortic valve regurgitation is not visualized.  5. The inferior vena cava is normal in size with greater than 50% respiratory variability, suggesting right atrial pressure of 3 mmHg. FINDINGS  Left Ventricle: Left ventricular ejection fraction, by estimation, is 55 to 60%. The left ventricle has normal function. The left ventricle has no regional wall motion abnormalities. The average left ventricular global longitudinal strain is -16.9 %. The global longitudinal strain is normal. The left ventricular internal cavity size was normal in size. There is no left ventricular hypertrophy. Left ventricular diastolic parameters were normal. Right Ventricle: The right ventricular size is normal. No increase in right ventricular wall thickness. Right ventricular systolic function is normal. Left Atrium: Left atrial size was normal in size. Right Atrium: Right atrial size was normal in size. Pericardium: There is no evidence of pericardial effusion. Mitral Valve: The mitral valve is normal in structure. No evidence of mitral valve regurgitation. MV peak gradient, 2.8 mmHg. The mean mitral valve gradient is 1.0 mmHg. Tricuspid Valve: The tricuspid valve is normal in structure.  Tricuspid valve regurgitation is not demonstrated. Aortic Valve: The aortic valve was not well visualized. Aortic valve regurgitation is not visualized. Aortic valve mean gradient measures 5.0 mmHg. Aortic valve peak gradient measures 8.2 mmHg. Aortic valve area, by VTI measures 2.98 cm. Pulmonic Valve: The pulmonic valve was not well visualized. Pulmonic valve regurgitation is not visualized. Aorta: The aortic root was not well visualized. Venous: The inferior vena cava is normal in size with greater than 50% respiratory variability, suggesting  right atrial pressure of 3 mmHg. IAS/Shunts: No atrial level shunt detected by color flow Doppler.  LEFT VENTRICLE PLAX 2D LVIDd:         5.00 cm   Diastology LVIDs:         3.60 cm   LV e' medial:    8.81 cm/s LV PW:         1.00 cm   LV E/e' medial:  8.7 LV IVS:        1.00 cm   LV e' lateral:   12.20 cm/s LVOT diam:     2.00 cm   LV E/e' lateral: 6.3 LV SV:         81 LV SV Index:   38        2D Longitudinal Strain LVOT Area:     3.14 cm  2D Strain GLS Avg:     -16.9 %  RIGHT VENTRICLE RV Basal diam:  2.80 cm RV Mid diam:    3.00 cm RV S prime:     10.80 cm/s TAPSE (M-mode): 2.1 cm LEFT ATRIUM             Index        RIGHT ATRIUM           Index LA diam:        3.70 cm 1.71 cm/m   RA Area:     12.70 cm LA Vol (A2C):   67.9 ml 31.42 ml/m  RA Volume:   27.80 ml  12.86 ml/m LA Vol (A4C):   52.2 ml 24.16 ml/m LA Biplane Vol: 60.0 ml 27.77 ml/m  AORTIC VALVE                     PULMONIC VALVE AV Area (Vmax):    2.83 cm      PV Vmax:        0.96 m/s AV Area (Vmean):   2.47 cm      PV Peak grad:   3.7 mmHg AV Area (VTI):     2.98 cm      RVOT Peak grad: 3 mmHg AV Vmax:           143.00 cm/s AV Vmean:          104.000 cm/s AV VTI:            0.272 m AV Peak Grad:      8.2 mmHg AV Mean Grad:      5.0 mmHg LVOT Vmax:         129.00 cm/s LVOT Vmean:        81.800 cm/s LVOT VTI:          0.258 m LVOT/AV VTI ratio: 0.95 MITRAL VALVE MV Area (PHT): 4.39 cm    SHUNTS MV  Area VTI:   3.27 cm    Systemic VTI:  0.26 m MV Peak grad:  2.8 mmHg    Systemic Diam: 2.00 cm MV Mean grad:  1.0 mmHg MV Vmax:       0.84 m/s MV Vmean:      57.0 cm/s MV Decel Time: 173 msec MV E velocity: 77.00 cm/s MV A velocity: 59.70 cm/s MV E/A ratio:  1.29 MV A Prime:    10.9 cm/s Debbe Odea MD Electronically signed by Debbe Odea MD Signature Date/Time: 07/21/2023/3:01:09 PM    Final    CARDIAC CATHETERIZATION  Result Date: 07/20/2023   Prox RCA to Mid RCA lesion is 30% stenosed.   RPDA  lesion is 50% stenosed.   Dist Cx lesion is 90% stenosed.   3rd Mrg lesion is 70% stenosed.   Mid LAD lesion is 30% stenosed.   1st Diag lesion is 100% stenosed.   2nd Diag lesion is 100% stenosed.   Previously placed Ost LAD to Mid LAD stent of unknown type is  widely patent.   A drug-eluting stent was successfully placed using a STENT ONYX FRONTIER 2.5X18.   A drug-eluting stent was successfully placed using a STENT ONYX FRONTIER 2.0X15.   Post intervention, there is a 0% residual stenosis.   Post intervention, there is a 0% residual stenosis.   There is mild to moderate left ventricular systolic dysfunction.   LV end diastolic pressure is moderately elevated.   The left ventricular ejection fraction is 35-45% by visual estimate. 1.  Patent LAD and RCA stents with mild in-stent restenosis.  Chronically occluded first and second diagonal.  80 to 90% hazy stenosis in the distal left circumflex at the bifurcation of an OM branch seems to be the culprit for inferior/inferolateral ST elevation myocardial infarction. 2.  Mildly to moderately reduced LV systolic function with moderately elevated left ventricular end-diastolic pressure. 3.  Successful complex bifurcation angioplasty utilizing the reverse mini crush technique to the distal left circumflex/OM bifurcation.  Initially, a stent was placed in the distal left circumflex but there was loss of flow in the OM branch and thus I elected to treat given that  the patient was highly symptomatic. Recommendations: Dual antiplatelet therapy for at least 12 months and preferably indefinitely. Aggrastat infusion for 12 hours given risk of stent thrombosis in the setting of vomiting. Nitroglycerin drip for pain control. Check urine drug screen. Aggressive treatment of risk factors and smoking cessation.    Microbiology: Results for orders placed or performed during the hospital encounter of 07/20/23  MRSA Next Gen by PCR, Nasal     Status: None   Collection Time: 07/21/23 12:00 AM   Specimen: Nasal Mucosa; Nasal Swab  Result Value Ref Range Status   MRSA by PCR Next Gen NOT DETECTED NOT DETECTED Final    Comment: (NOTE) The GeneXpert MRSA Assay (FDA approved for NASAL specimens only), is one component of a comprehensive MRSA colonization surveillance program. It is not intended to diagnose MRSA infection nor to guide or monitor treatment for MRSA infections. Test performance is not FDA approved in patients less than 37 years old. Performed at [facility], [address] [place], [place] [address]     Labs: CBC: Recent Labs  Lab 07/20/23 2136 07/21/23 0709 07/22/23 0341  WBC 14.2* 12.1* 9.8  HGB 12.3 10.7* 10.5*  HCT 40.9 33.6* 34.1*  MCV 74.2* 72.4* 73.8*  PLT 479* 395 308   Basic Metabolic Panel: Recent Labs  Lab 07/20/23 2136 07/21/23 0709 07/22/23 0341  NA 133* 134* 133*  K 4.7 3.7 3.7  CL 100 102 101  CO2 22 22 23   GLUCOSE 330* 184* 232*  BUN 12 12 12   CREATININE 0.77 0.46 0.52  CALCIUM 8.9 8.4* 8.3*   Liver Function Tests: No results for input(s): "AST", "ALT", "ALKPHOS", "BILITOT", "PROT", "ALBUMIN" in the last 168 hours. CBG: Recent Labs  Lab 07/21/23 0740 07/21/23 1115 07/21/23 1550 07/21/23 2147 07/22/23 0737  GLUCAP 143* 153* 238* 314* 250*    Discharge time spent: greater than 30 minutes.  This record has been created using Conservation officer, historic buildings. Errors have been sought and  corrected,but may not always be located.  Such creation errors do not reflect on the standard of care.   Signed: Arnetha Courser, MD [facility] 07/22/2023

## 2023-07-22 NOTE — Inpatient Diabetes Management (Signed)
Inpatient Diabetes Program Recommendations  AACE/ADA: New Consensus Statement on Inpatient Glycemic Control (2015)  Target Ranges:  Prepandial:   less than 140 mg/dL      Peak postprandial:   less than 180 mg/dL (1-2 hours)      Critically ill patients:  140 - 180 mg/dL   Lab Results  Component Value Date   GLUCAP 314 (H) 07/21/2023   HGBA1C 11.0 (H) 07/21/2023    Review of Glycemic Control  Latest Reference Range & Units 07/21/23 15:50 07/21/23 21:47 07/22/23 07:37  Glucose-Capillary 70 - 99 mg/dL 528 (H) 413 (H) 244 (H)  (H): Data is abnormally high  Diabetes history: DM2 Outpatient Diabetes medications: Novolog 0-9 units TID Current orders for Inpatient glycemic control: Novolog 0-15 units TID and 0-5 units QHS   Inpatient Diabetes Program Recommendations:     Semglee 16 units every day (0.15 units x 108.9 kg).   Will continue to follow while inpatient.   Thank you, Dulce Sellar, MSN, CDCES Diabetes Coordinator Inpatient Diabetes Program [phone] (team pager from 8a-5p)

## 2023-07-22 NOTE — Plan of Care (Signed)

## 2023-07-23 ENCOUNTER — Inpatient Hospital Stay
Admission: EM | Admit: 2023-07-23 | Discharge: 2023-07-24 | DRG: 250 | Disposition: A | Discharge status: Home / Self Care | Payer: Medicaid Other | Attending: Obstetrics and Gynecology | Admitting: Obstetrics and Gynecology

## 2023-07-23 ENCOUNTER — Other Ambulatory Visit: Payer: Self-pay

## 2023-07-23 ENCOUNTER — Encounter
Admission: EM | Disposition: A | Discharge status: Home / Self Care | Payer: Self-pay | Source: Home / Self Care | Attending: Obstetrics and Gynecology

## 2023-07-23 DIAGNOSIS — Z881 Allergy status to other antibiotic agents status: Secondary | ICD-10-CM

## 2023-07-23 DIAGNOSIS — R079 Chest pain, unspecified: Secondary | ICD-10-CM | POA: Diagnosis present

## 2023-07-23 DIAGNOSIS — Y712 Prosthetic and other implants, materials and accessory cardiovascular devices associated with adverse incidents: Secondary | ICD-10-CM | POA: Diagnosis present

## 2023-07-23 DIAGNOSIS — D72829 Elevated white blood cell count, unspecified: Secondary | ICD-10-CM | POA: Diagnosis present

## 2023-07-23 DIAGNOSIS — E1143 Type 2 diabetes mellitus with diabetic autonomic (poly)neuropathy: Secondary | ICD-10-CM | POA: Diagnosis present

## 2023-07-23 DIAGNOSIS — I10 Essential (primary) hypertension: Secondary | ICD-10-CM | POA: Diagnosis present

## 2023-07-23 DIAGNOSIS — Z7902 Long term (current) use of antithrombotics/antiplatelets: Secondary | ICD-10-CM | POA: Diagnosis not present

## 2023-07-23 DIAGNOSIS — Z79899 Other long term (current) drug therapy: Secondary | ICD-10-CM | POA: Diagnosis not present

## 2023-07-23 DIAGNOSIS — I2510 Atherosclerotic heart disease of native coronary artery without angina pectoris: Secondary | ICD-10-CM | POA: Diagnosis present

## 2023-07-23 DIAGNOSIS — I2119 ST elevation (STEMI) myocardial infarction involving other coronary artery of inferior wall: Secondary | ICD-10-CM | POA: Diagnosis present

## 2023-07-23 DIAGNOSIS — K3184 Gastroparesis: Secondary | ICD-10-CM | POA: Diagnosis present

## 2023-07-23 DIAGNOSIS — I213 ST elevation (STEMI) myocardial infarction of unspecified site: Principal | ICD-10-CM

## 2023-07-23 DIAGNOSIS — G8929 Other chronic pain: Secondary | ICD-10-CM | POA: Diagnosis present

## 2023-07-23 DIAGNOSIS — Y831 Surgical operation with implant of artificial internal device as the cause of abnormal reaction of the patient, or of later complication, without mention of misadventure at the time of the procedure: Secondary | ICD-10-CM | POA: Diagnosis present

## 2023-07-23 DIAGNOSIS — T82867A Thrombosis of cardiac prosthetic devices, implants and grafts, initial encounter: Secondary | ICD-10-CM | POA: Diagnosis present

## 2023-07-23 DIAGNOSIS — F603 Borderline personality disorder: Secondary | ICD-10-CM | POA: Diagnosis present

## 2023-07-23 DIAGNOSIS — I2582 Chronic total occlusion of coronary artery: Secondary | ICD-10-CM | POA: Diagnosis present

## 2023-07-23 DIAGNOSIS — E118 Type 2 diabetes mellitus with unspecified complications: Secondary | ICD-10-CM | POA: Diagnosis present

## 2023-07-23 DIAGNOSIS — F17210 Nicotine dependence, cigarettes, uncomplicated: Secondary | ICD-10-CM | POA: Diagnosis present

## 2023-07-23 DIAGNOSIS — Z833 Family history of diabetes mellitus: Secondary | ICD-10-CM | POA: Diagnosis not present

## 2023-07-23 DIAGNOSIS — E1165 Type 2 diabetes mellitus with hyperglycemia: Secondary | ICD-10-CM | POA: Diagnosis present

## 2023-07-23 DIAGNOSIS — Z7982 Long term (current) use of aspirin: Secondary | ICD-10-CM | POA: Diagnosis not present

## 2023-07-23 DIAGNOSIS — E785 Hyperlipidemia, unspecified: Secondary | ICD-10-CM | POA: Diagnosis present

## 2023-07-23 DIAGNOSIS — Z8614 Personal history of Methicillin resistant Staphylococcus aureus infection: Secondary | ICD-10-CM

## 2023-07-23 DIAGNOSIS — Z8249 Family history of ischemic heart disease and other diseases of the circulatory system: Secondary | ICD-10-CM

## 2023-07-23 DIAGNOSIS — Z720 Tobacco use: Secondary | ICD-10-CM

## 2023-07-23 DIAGNOSIS — Z794 Long term (current) use of insulin: Secondary | ICD-10-CM

## 2023-07-23 DIAGNOSIS — F1410 Cocaine abuse, uncomplicated: Secondary | ICD-10-CM | POA: Diagnosis present

## 2023-07-23 DIAGNOSIS — E871 Hypo-osmolality and hyponatremia: Secondary | ICD-10-CM | POA: Diagnosis present

## 2023-07-23 DIAGNOSIS — T82528A Displacement of other cardiac and vascular devices and implants, initial encounter: Secondary | ICD-10-CM | POA: Diagnosis present

## 2023-07-23 DIAGNOSIS — E782 Mixed hyperlipidemia: Secondary | ICD-10-CM | POA: Diagnosis not present

## 2023-07-23 DIAGNOSIS — I214 Non-ST elevation (NSTEMI) myocardial infarction: Secondary | ICD-10-CM | POA: Diagnosis not present

## 2023-07-23 HISTORY — PX: LEFT HEART CATH AND CORONARY ANGIOGRAPHY: CATH118249

## 2023-07-23 HISTORY — PX: CORONARY ULTRASOUND/IVUS: CATH118244

## 2023-07-23 HISTORY — PX: CORONARY/GRAFT ACUTE MI REVASCULARIZATION: CATH118305

## 2023-07-23 LAB — CBC WITH DIFFERENTIAL/PLATELET
Abs Immature Granulocytes: 0.07 10*3/uL (ref 0.00–0.07)
Basophils Absolute: 0 10*3/uL (ref 0.0–0.1)
Basophils Relative: 0 %
Eosinophils Absolute: 0.2 10*3/uL (ref 0.0–0.5)
Eosinophils Relative: 2 %
HCT: 38.1 % (ref 36.0–46.0)
Hemoglobin: 11.8 g/dL — ABNORMAL LOW (ref 12.0–15.0)
Immature Granulocytes: 1 %
Lymphocytes Relative: 14 %
Lymphs Abs: 1.7 10*3/uL (ref 0.7–4.0)
MCH: 23 pg — ABNORMAL LOW (ref 26.0–34.0)
MCHC: 31 g/dL (ref 30.0–36.0)
MCV: 74.1 fL — ABNORMAL LOW (ref 80.0–100.0)
Monocytes Absolute: 0.9 10*3/uL (ref 0.1–1.0)
Monocytes Relative: 7 %
Neutro Abs: 9.1 10*3/uL — ABNORMAL HIGH (ref 1.7–7.7)
Neutrophils Relative %: 76 %
Platelets: 394 10*3/uL (ref 150–400)
RBC: 5.14 MIL/uL — ABNORMAL HIGH (ref 3.87–5.11)
RDW: 16.8 % — ABNORMAL HIGH (ref 11.5–15.5)
WBC: 12 10*3/uL — ABNORMAL HIGH (ref 4.0–10.5)
nRBC: 0 % (ref 0.0–0.2)

## 2023-07-23 LAB — BASIC METABOLIC PANEL
Anion gap: 9 (ref 5–15)
BUN: 16 mg/dL (ref 6–20)
CO2: 22 mmol/L (ref 22–32)
Calcium: 8.4 mg/dL — ABNORMAL LOW (ref 8.9–10.3)
Chloride: 101 mmol/L (ref 98–111)
Creatinine, Ser: 0.61 mg/dL (ref 0.44–1.00)
GFR, Estimated: >60 mL/min (ref >60)
Glucose, Bld: 326 mg/dL — ABNORMAL HIGH (ref 70–99)
Potassium: 5 mmol/L (ref 3.5–5.1)
Sodium: 132 mmol/L — ABNORMAL LOW (ref 135–145)

## 2023-07-23 LAB — TROPONIN I (HIGH SENSITIVITY): Troponin I (High Sensitivity): 811 ng/L (ref <18)

## 2023-07-23 LAB — CREATININE, SERUM
Creatinine, Ser: 0.57 mg/dL (ref 0.44–1.00)
GFR, Estimated: >60 mL/min (ref >60)

## 2023-07-23 LAB — POCT ACTIVATED CLOTTING TIME: Activated Clotting Time: 312 s

## 2023-07-23 LAB — LIPOPROTEIN A (LPA): Lipoprotein (a): 166.5 nmol/L — ABNORMAL HIGH (ref <75.0)

## 2023-07-23 SURGERY — CORONARY/GRAFT ACUTE MI REVASCULARIZATION
Anesthesia: Moderate Sedation

## 2023-07-23 MED ORDER — VERAPAMIL HCL 2.5 MG/ML IV SOLN
INTRAVENOUS | Status: DC | PRN
  Administered 2023-07-23: 2.5 mg via INTRAVENOUS

## 2023-07-23 MED ORDER — MIDAZOLAM HCL 2 MG/2ML IJ SOLN
INTRAMUSCULAR | Status: AC
  Filled 2023-07-23: qty 2

## 2023-07-23 MED ORDER — TIROFIBAN HCL IN NACL 5-0.9 MG/100ML-% IV SOLN
INTRAVENOUS | Status: AC
  Filled 2023-07-23: qty 100

## 2023-07-23 MED ORDER — SODIUM CHLORIDE 0.9% FLUSH: 3.0000 mL | INTRAVENOUS | Status: DC | PRN

## 2023-07-23 MED ORDER — SODIUM CHLORIDE 0.9 % IV SOLN: INTRAVENOUS | Status: AC

## 2023-07-23 MED ORDER — HEPARIN (PORCINE) IN NACL 1000-0.9 UT/500ML-% IV SOLN
INTRAVENOUS | Status: DC | PRN
  Administered 2023-07-23 (×2): 500 mL

## 2023-07-23 MED ORDER — LIDOCAINE HCL (PF) 1 % IJ SOLN
INTRAMUSCULAR | Status: DC | PRN
  Administered 2023-07-23: 2 mL

## 2023-07-23 MED ORDER — FENTANYL CITRATE PF 50 MCG/ML IJ SOSY
PREFILLED_SYRINGE | INTRAMUSCULAR | Status: AC
  Filled 2023-07-23: qty 1

## 2023-07-23 MED ORDER — SODIUM CHLORIDE 0.9 % IV SOLN: 250.0000 mL | INTRAVENOUS | Status: DC | PRN

## 2023-07-23 MED ORDER — FENTANYL CITRATE PF 50 MCG/ML IJ SOSY: 50.0000 ug | PREFILLED_SYRINGE | Freq: Once | INTRAMUSCULAR | Status: AC

## 2023-07-23 MED ORDER — ASPIRIN 81 MG PO TBEC
81.0000 mg | DELAYED_RELEASE_TABLET | Freq: Every day | ORAL | Status: DC
  Administered 2023-07-23 – 2023-07-24 (×2): 81 mg via ORAL
  Filled 2023-07-23 (×2): qty 1

## 2023-07-23 MED ORDER — ACETAMINOPHEN 325 MG PO TABS: 650.0000 mg | ORAL_TABLET | ORAL | Status: DC | PRN

## 2023-07-23 MED ORDER — TIROFIBAN (AGGRASTAT) BOLUS VIA INFUSION
INTRAVENOUS | Status: DC | PRN
  Administered 2023-07-23: 2700 ug via INTRAVENOUS

## 2023-07-23 MED ORDER — PROMETHAZINE HCL 25 MG RE SUPP: 25.0000 mg | Freq: Three times a day (TID) | RECTAL | Status: DC | PRN

## 2023-07-23 MED ORDER — HEPARIN SODIUM (PORCINE) 1000 UNIT/ML IJ SOLN
INTRAMUSCULAR | Status: AC
  Filled 2023-07-23: qty 10

## 2023-07-23 MED ORDER — MIDAZOLAM HCL 2 MG/2ML IJ SOLN
INTRAMUSCULAR | Status: DC | PRN
  Administered 2023-07-23: 2 mg via INTRAVENOUS

## 2023-07-23 MED ORDER — FENTANYL CITRATE PF 50 MCG/ML IJ SOSY
PREFILLED_SYRINGE | INTRAMUSCULAR | Status: AC
  Administered 2023-07-23: 50 ug via INTRAVENOUS
  Filled 2023-07-23: qty 1

## 2023-07-23 MED ORDER — NITROGLYCERIN 1 MG/10 ML FOR IR/CATH LAB
INTRA_ARTERIAL | Status: AC
  Filled 2023-07-23: qty 10

## 2023-07-23 MED ORDER — SODIUM CHLORIDE 0.9 % IV SOLN
500.0000 mg | Freq: Once | INTRAVENOUS | Status: AC
  Administered 2023-07-24: 500 mg via INTRAVENOUS
  Filled 2023-07-23: qty 10

## 2023-07-23 MED ORDER — FENTANYL CITRATE PF 50 MCG/ML IJ SOSY
50.0000 ug | PREFILLED_SYRINGE | Freq: Once | INTRAMUSCULAR | Status: AC
  Administered 2023-07-23: 50 ug via INTRAVENOUS

## 2023-07-23 MED ORDER — SODIUM CHLORIDE 0.9% FLUSH
3.0000 mL | Freq: Two times a day (BID) | INTRAVENOUS | Status: DC
  Administered 2023-07-24 (×2): 3 mL via INTRAVENOUS

## 2023-07-23 MED ORDER — ASPIRIN 325 MG PO TBEC
325.0000 mg | DELAYED_RELEASE_TABLET | Freq: Once | ORAL | Status: AC
Start: 2023-07-23 — End: 2023-07-23
  Administered 2023-07-23: 325 mg via ORAL

## 2023-07-23 MED ORDER — HEPARIN SODIUM (PORCINE) 5000 UNIT/ML IJ SOLN: 4000.0000 [IU] | Freq: Once | INTRAMUSCULAR | Status: DC

## 2023-07-23 MED ORDER — INSULIN ASPART 100 UNIT/ML ~~LOC~~ SOLN: 0.0000 [IU] | Freq: Three times a day (TID) | SUBCUTANEOUS | Status: DC

## 2023-07-23 MED ORDER — FENTANYL CITRATE (PF) 100 MCG/2ML IJ SOLN
INTRAMUSCULAR | Status: AC
  Filled 2023-07-23: qty 2

## 2023-07-23 MED ORDER — IOHEXOL 300 MG/ML  SOLN
INTRAMUSCULAR | Status: DC | PRN
  Administered 2023-07-23: 100 mL

## 2023-07-23 MED ORDER — MORPHINE SULFATE (PF) 2 MG/ML IV SOLN
2.0000 mg | INTRAVENOUS | Status: DC | PRN
  Administered 2023-07-23: 2 mg via INTRAVENOUS
  Filled 2023-07-23: qty 1

## 2023-07-23 MED ORDER — NITROGLYCERIN 1 MG/10 ML FOR IR/CATH LAB
INTRA_ARTERIAL | Status: DC | PRN
  Administered 2023-07-23: 100 ug

## 2023-07-23 MED ORDER — INSULIN ASPART 100 UNIT/ML IJ SOLN
0.0000 [IU] | Freq: Three times a day (TID) | INTRAMUSCULAR | Status: DC
  Administered 2023-07-24: 3 [IU] via SUBCUTANEOUS

## 2023-07-23 MED ORDER — ERYTHROMYCIN BASE 250 MG PO TBEC
500.0000 mg | DELAYED_RELEASE_TABLET | Freq: Three times a day (TID) | ORAL | Status: DC
  Administered 2023-07-24: 500 mg via ORAL
  Filled 2023-07-23 (×3): qty 2

## 2023-07-23 MED ORDER — TICAGRELOR 90 MG PO TABS
90.0000 mg | ORAL_TABLET | Freq: Two times a day (BID) | ORAL | Status: DC
  Administered 2023-07-23 – 2023-07-24 (×2): 90 mg via ORAL
  Filled 2023-07-23 (×2): qty 1

## 2023-07-23 MED ORDER — ENOXAPARIN SODIUM 40 MG/0.4ML IJ SOSY
40.0000 mg | PREFILLED_SYRINGE | INTRAMUSCULAR | Status: DC
  Administered 2023-07-24: 40 mg via SUBCUTANEOUS
  Filled 2023-07-23: qty 0.4

## 2023-07-23 MED ORDER — ESCITALOPRAM OXALATE 20 MG PO TABS
10.0000 mg | ORAL_TABLET | Freq: Every day | ORAL | Status: DC
  Administered 2023-07-23 – 2023-07-24 (×2): 10 mg via ORAL
  Filled 2023-07-23 (×2): qty 1

## 2023-07-23 MED ORDER — METOPROLOL SUCCINATE ER 50 MG PO TB24
100.0000 mg | ORAL_TABLET | Freq: Every day | ORAL | Status: DC
  Administered 2023-07-23 – 2023-07-24 (×2): 100 mg via ORAL
  Filled 2023-07-23 (×2): qty 2

## 2023-07-23 MED ORDER — ATORVASTATIN CALCIUM 20 MG PO TABS
80.0000 mg | ORAL_TABLET | Freq: Every day | ORAL | Status: DC
  Administered 2023-07-23 – 2023-07-24 (×2): 80 mg via ORAL
  Filled 2023-07-23 (×2): qty 4

## 2023-07-23 MED ORDER — HEPARIN (PORCINE) IN NACL 1000-0.9 UT/500ML-% IV SOLN
INTRAVENOUS | Status: AC
Start: 2023-07-23 — End: ?
  Filled 2023-07-23: qty 1000

## 2023-07-23 MED ORDER — METOCLOPRAMIDE HCL 10 MG PO TABS: 10.0000 mg | ORAL_TABLET | Freq: Two times a day (BID) | ORAL | Status: DC | PRN

## 2023-07-23 MED ORDER — HEPARIN SODIUM (PORCINE) 5000 UNIT/ML IJ SOLN
4000.0000 [IU] | Freq: Once | INTRAMUSCULAR | Status: AC
  Administered 2023-07-23: 4000 [IU] via INTRAVENOUS

## 2023-07-23 MED ORDER — PANTOPRAZOLE SODIUM 40 MG PO TBEC
40.0000 mg | DELAYED_RELEASE_TABLET | Freq: Every day | ORAL | Status: DC
  Administered 2023-07-23 – 2023-07-24 (×2): 40 mg via ORAL
  Filled 2023-07-23 (×2): qty 1

## 2023-07-23 MED ORDER — ONDANSETRON HCL 4 MG/2ML IJ SOLN
4.0000 mg | Freq: Four times a day (QID) | INTRAMUSCULAR | Status: DC | PRN
  Administered 2023-07-24 (×2): 4 mg via INTRAVENOUS
  Filled 2023-07-23 (×2): qty 2

## 2023-07-23 MED ORDER — HEPARIN SODIUM (PORCINE) 1000 UNIT/ML IJ SOLN
INTRAMUSCULAR | Status: DC | PRN
  Administered 2023-07-23: 7000 [IU] via INTRAVENOUS

## 2023-07-23 SURGICAL SUPPLY — 20 items
BALLN ~~LOC~~ TREK NEO RX 3.0X12 (BALLOONS) ×1
BALLOON ~~LOC~~ TREK NEO RX 3.0X12 (BALLOONS) IMPLANT
CATH EAGLE EYE PLAT IMAGING (CATHETERS) IMPLANT
CATH INFINITI JR4 5F (CATHETERS) IMPLANT
CATH LAUNCHER 6FR EBU 3.75 (CATHETERS) IMPLANT
DEVICE DSSCT PLSMBLD 3.0S LGHT (MISCELLANEOUS) IMPLANT
DEVICE RAD TR BAND REGULAR (VASCULAR PRODUCTS) IMPLANT
DRAPE BRACHIAL (DRAPES) IMPLANT
GLIDESHEATH SLEND SS 6F .021 (SHEATH) IMPLANT
GUIDEWIRE INQWIRE 1.5J.035X260 (WIRE) IMPLANT
INQWIRE 1.5J .035X260CM (WIRE) ×1
KIT ENCORE 26 ADVANTAGE (KITS) IMPLANT
KIT SYRINGE INJ CVI SPIKEX1 (MISCELLANEOUS) IMPLANT
PACK CARDIAC CATH (CUSTOM PROCEDURE TRAY) ×1 IMPLANT
PLASMABLADE 3.0S W/LIGHT (MISCELLANEOUS)
PROTECTION STATION PRESSURIZED (MISCELLANEOUS) ×1
SET ATX-X65L (MISCELLANEOUS) IMPLANT
STATION PROTECTION PRESSURIZED (MISCELLANEOUS) IMPLANT
TUBING CIL FLEX 10 FLL-RA (TUBING) IMPLANT
WIRE RUNTHROUGH .014X180CM (WIRE) IMPLANT

## 2023-07-23 NOTE — Consult Note (Signed)
Cardiology Consultation   Patient ID: KAMY ANTRIM MRN: [id number]; DOB: 06-15-1981  Admit date: 07/23/2023 Date of Consult: 07/23/2023  PCP:  Dan Humphreys, MD   [facility] Providers Cardiologist:  None   { [facility] cardiology   Patient Profile:   TALESHA GHENT is a 42 y.o. female with a hx of coronary artery disease who is being seen 07/23/2023 for the evaluation of inferolateral ST elevation myocardial infarction at the request of Dr. Anner Crete.  History of Present Illness:   Ms. Addeo is a 42 year old female with known history of coronary artery disease, type 2 diabetes, gastroparesis, essential hypertension, tobacco use, obesity and hyperlipidemia.  She has extensive cardiac history with previous myocardial infarction in 2022.  She was treated with drug-eluting stent placement to the right coronary artery at that time.  She has been following with [facility] cardiology and underwent stenting of the proximal/mid LAD.She was hospitalized in June of this year with elevated troponin thought to be due to myocarditis. Repeat cardiac catheterization at that shown showed patent stents. She was hospitalized on August with lateral ST elevation myocardial infarction. Cardiac catheterization showed an occluded diagonal branch which was felt to be the culprit.   She recently admitted here on Saturday with inferolateral ST elevation myocardial infarction.  Emergent cardiac catheterization showed severe stenosis in the distal left circumflex at the bifurcation of OM branch.  She underwent bifurcation stenting of the distal left circumflex and OM branch.  The initial plan was to place a stent in the main artery but there was loss of flow in the OM branch necessitating stent placement using the reverse mini crush technique.  She was discharged home yesterday.  Echo showed normal LV systolic function.  Labs showed elevated LDL at 165 and spite of being on a statin but she reports poor  tolerance to oral medications overall due to gastroparesis.  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  She came to the ED for evaluation of chest pain that started 15 to 30 minutes before presentation.  The pain was very similar to her recent myocardial infarction.  EKG showed the same EKG changes of inferior and inferolateral ST elevation.  She was given heparin and emergent cardiac catheterization was recommended.     Past Medical History:  Diagnosis Date   Barrett esophagus    Diabetes mellitus without complication (HCC)    Gastroparesis    Migraines    MRSA (methicillin resistant staph aureus) culture positive    2009   Obesity     Past Surgical History:  Procedure Laterality Date   CARDIAC CATHETERIZATION     CESAREAN SECTION     CORONARY STENT INTERVENTION N/A 05/02/2021   Procedure: CORONARY STENT INTERVENTION;  Surgeon: Yvonne Kendall, MD;  Location: [facility] INVASIVE CV LAB;  Service: Cardiovascular;  Laterality: N/A;   CORONARY ULTRASOUND/IVUS N/A 05/02/2021   Procedure: Intravascular Ultrasound/IVUS;  Surgeon: Yvonne Kendall, MD;  Location: [facility] INVASIVE CV LAB;  Service: Cardiovascular;  Laterality: N/A;   CORONARY/GRAFT ACUTE MI REVASCULARIZATION N/A 07/20/2023   Procedure: Coronary/Graft Acute MI Revascularization;  Surgeon: Iran Ouch, MD;  Location: [facility] INVASIVE CV LAB;  Service: Cardiovascular;  Laterality: N/A;   ESOPHAGOGASTRODUODENOSCOPY N/A 07/28/2019   Procedure: ESOPHAGOGASTRODUODENOSCOPY (EGD);  Surgeon: Toney Reil, MD;  Location: [facility] ENDOSCOPY;  Service: Gastroenterology;  Laterality: N/A;   LEFT HEART CATH AND CORONARY ANGIOGRAPHY N/A 05/02/2021   Procedure: LEFT HEART CATH AND CORONARY ANGIOGRAPHY;  Surgeon: Yvonne Kendall, MD;  Location: [facility] INVASIVE CV LAB;  Service: Cardiovascular;  Laterality: N/A;   LEFT HEART CATH AND CORONARY ANGIOGRAPHY N/A 07/20/2023   Procedure: LEFT HEART CATH AND CORONARY ANGIOGRAPHY;  Surgeon:  Iran Ouch, MD;  Location: [facility] INVASIVE CV LAB;  Service: Cardiovascular;  Laterality: N/A;     Home Medications:  Prior to Admission medications   Medication Sig Start Date End Date Taking? Authorizing Provider  aspirin EC 81 MG EC tablet Take 1 tablet (81 mg total) by mouth daily. Swallow whole. 05/04/21  Yes Delfino Lovett, MD  atorvastatin (LIPITOR) 80 MG tablet Take 1 tablet (80 mg total) by mouth daily. 07/22/23 10/20/23 Yes Arnetha Courser, MD  escitalopram (LEXAPRO) 10 MG tablet Take 1 tablet (10 mg total) by mouth daily. 07/22/23 10/20/23 Yes Arnetha Courser, MD  insulin aspart (NOVOLOG) 100 UNIT/ML injection Inject 0-9 Units into the skin 3 (three) times daily with meals. 07/16/19  Yes Katha Hamming, MD  metoprolol succinate (TOPROL-XL) 100 MG 24 hr tablet Take 1 tablet (100 mg total) by mouth daily. Take with or immediately following a meal. PLEASE SCHEDULE OFFICE VISIT FOR FURTHER REFILLS. THANK YOU! 07/22/23 10/20/23 Yes Arnetha Courser, MD  pantoprazole (PROTONIX) 40 MG tablet Take 1 tablet (40 mg total) by mouth daily. PLEASE SCHEDULE OFFICE VISIT FOR FURTHER REFILLS. THANK YOU! 07/22/23 10/20/23 Yes Arnetha Courser, MD  promethazine (PHENERGAN) 25 MG suppository Place 1 suppository (25 mg total) rectally every 8 (eight) hours as needed for refractory nausea / vomiting. 06/29/21  Yes Shaune Pollack, MD  ticagrelor (BRILINTA) 90 MG TABS tablet Take 1 tablet (90 mg total) by mouth 2 (two) times daily. 07/22/23  Yes Arnetha Courser, MD    Inpatient Medications: Scheduled Meds:  aspirin EC  81 mg Oral Daily   atorvastatin  80 mg Oral Daily   [START ON 07/24/2023] enoxaparin (LOVENOX) injection  40 mg Subcutaneous Q24H   escitalopram  10 mg Oral Daily   fentaNYL       [START ON 07/24/2023] insulin aspart  0-9 Units Subcutaneous TID WC   metoprolol succinate  100 mg Oral Daily   pantoprazole  40 mg Oral Daily   [START ON 07/24/2023] sodium chloride flush  3 mL Intravenous Q12H    ticagrelor  90 mg Oral BID   Continuous Infusions:  sodium chloride     [START ON 07/24/2023] sodium chloride     PRN Meds: [START ON 07/24/2023] sodium chloride, acetaminophen, fentaNYL, metoCLOPramide, ondansetron (ZOFRAN) IV, promethazine, [START ON 07/24/2023] sodium chloride flush  Allergies:    Allergies  Allergen Reactions   Quinolones Other (See Comments)    Social History:   Social History   Socioeconomic History   Marital status: Single    Spouse name: Not on file   Number of children: Not on file   Years of education: Not on file   Highest education level: Not on file  Occupational History   Not on file  Tobacco Use   Smoking status: Some Days    Current packs/day: 0.50    Types: Cigarettes   Smokeless tobacco: Never  Vaping Use   Vaping status: Never Used  Substance and Sexual Activity   Alcohol use: Yes   Drug use: Yes    Types: Marijuana   Sexual activity: Not on file  Other Topics Concern   Not on file  Social History Narrative   Not on file   Social Determinants of Health   Financial Resource Strain: Low Risk  (05/09/2023)   Received  from [facility]   Overall Financial Resource Strain (CARDIA)    Difficulty of Paying Living Expenses: Not very hard  Food Insecurity: No Food Insecurity (07/23/2023)   Hunger Vital Sign    Worried About Running Out of Food in the Last Year: Never true    Ran Out of Food in the Last Year: Never true  Transportation Needs: No Transportation Needs (07/23/2023)   PRAPARE - Administrator, Civil Service (Medical): No    Lack of Transportation (Non-Medical): No  Physical Activity: Not on file  Stress: Not on file  Social Connections: Not on file  Intimate Partner Violence: Not At Risk (07/23/2023)   Humiliation, Afraid, Rape, and Kick questionnaire    Fear of Current or Ex-Partner: No    Emotionally Abused: No    Physically Abused: No    Sexually Abused: No    Family History:    Family History   Problem Relation Age of Onset   Diabetes Mother    Cancer Mother    Heart failure Mother    Hypertension Mother    Heart attack Father    Diabetes Father    Hypertension Father      ROS:  Please see the history of present illness.   All other ROS reviewed and negative.     Physical Exam/Data:   Vitals:   07/23/23 1948 07/23/23 1953 07/23/23 1958 07/23/23 2025  BP: 139/89 129/86    Pulse: 84 83 (!) 0 85  Resp: 10 13  18   Temp:    98.1 F (36.7 C)  TempSrc:    Oral  SpO2: 99% 99%  98%  Weight:    108 kg  Height:    5\' 6"  (1.676 m)   No intake or output data in the 24 hours ending 07/23/23 2058    07/23/2023    8:25 PM 07/23/2023    6:22 PM 07/20/2023    9:32 PM  Last 3 Weights  Weight (lbs) 238 lb 1.6 oz 238 lb 1.6 oz 240 lb  Weight (kg) 108 kg 108 kg 108.863 kg     Body mass index is 38.43 kg/m.  General:  Well nourished, well developed, in no acute distress HEENT: normal Neck: no JVD Vascular: No carotid bruits; Distal pulses 2+ bilaterally Cardiac:  normal S1, S2; RRR; no murmur  Lungs:  clear to auscultation bilaterally, no wheezing, rhonchi or rales  Abd: soft, nontender, no hepatomegaly  Ext: no edema Musculoskeletal:  No deformities, BUE and BLE strength normal and equal Skin: warm and dry  Neuro:  CNs 2-12 intact, no focal abnormalities noted Psych:  Normal affect   EKG:  The EKG was personally reviewed and demonstrates: Sinus rhythm with 1 to 2 mm of ST elevation in the inferior leads as well as V4-V6. Telemetry:  Telemetry was personally reviewed and demonstrates:    Relevant CV Studies:   Laboratory Data:  High Sensitivity Troponin:   Recent Labs  Lab 07/20/23 2136 07/21/23 0032 07/21/23 0709  TROPONINIHS 14 22* 278*     Chemistry Recent Labs  Lab 07/20/23 2136 07/21/23 0709 07/22/23 0341  NA 133* 134* 133*  K 4.7 3.7 3.7  CL 100 102 101  CO2 22 22 23   GLUCOSE 330* 184* 232*  BUN 12 12 12   CREATININE 0.77 0.46 0.52  CALCIUM  8.9 8.4* 8.3*  GFRNONAA >60 >60 >60  ANIONGAP 11 10 9     No results for input(s): "PROT", "ALBUMIN", "AST", "ALT", "ALKPHOS", "  BILITOT" in the last 168 hours. Lipids  Recent Labs  Lab 07/20/23 2138  CHOL 255*  TRIG 174*  HDL 55  LDLCALC 165*  CHOLHDL 4.6    Hematology Recent Labs  Lab 07/21/23 0709 07/22/23 0341 07/23/23 2022  WBC 12.1* 9.8 12.0*  RBC 4.64 4.62 5.14*  HGB 10.7* 10.5* 11.8*  HCT 33.6* 34.1* 38.1  MCV 72.4* 73.8* 74.1*  MCH 23.1* 22.7* 23.0*  MCHC 31.8 30.8 31.0  RDW 16.4* 16.4* 16.8*  PLT 395 308 394   Thyroid No results for input(s): "TSH", "FREET4" in the last 168 hours.  BNPNo results for input(s): "BNP", "PROBNP" in the last 168 hours.  DDimer No results for input(s): "DDIMER" in the last 168 hours.   Radiology/Studies:  CARDIAC CATHETERIZATION  Result Date: 07/23/2023   Mid LAD lesion is 30% stenosed.   Prox RCA to Mid RCA lesion is 30% stenosed.   1st Diag lesion is 100% stenosed.   2nd Diag lesion is 100% stenosed.   RPDA lesion is 50% stenosed.   Dist Cx lesion is 50% stenosed.   Non-stenotic Ost LAD to Mid LAD lesion was previously treated.   Non-stenotic 3rd Mrg lesion was previously treated.   Balloon angioplasty was performed using a BALLN  TREK NEO RX 3.0X12.   Post intervention, there is a 0% residual stenosis.   There is mild left ventricular systolic dysfunction.   LV end diastolic pressure is mildly elevated.   The left ventricular ejection fraction is 45-50% by visual estimate. 1.  Subacute stent thrombosis in the distal left circumflex with patent stent in the OM branch.  IVUS showed evidence of stent mall apposition proximally.  No evidence of edge dissection.  Patent stents in the LAD and RCA. 2.  Mildly reduced LV systolic function with mildly elevated left ventricular end-diastolic pressure. 3.  Successful balloon angioplasty to the proximal segment of the left circumflex stent using a 3.0 x 12 mm noncompliant balloon to 16 atm.  Recommendations: Continue dual antiplatelet therapy indefinitely. Aggressive treatment of risk factors and consider a PCSK9 inhibitor given elevated LDL and LP(a).   ECHOCARDIOGRAM COMPLETE  Result Date: 07/21/2023    ECHOCARDIOGRAM REPORT   Patient Name:   DENETRIA FILLYAW Date of Exam: 07/21/2023 Medical Rec #:  [id number]            Height:       66.0 in Accession #:    [id number]           Weight:       240.0 lb Date of Birth:  [birth year]            BSA:          2.161 m Patient Age:    42 years             BP:           149/110 mmHg Patient Gender: F                    HR:           79 bpm. Exam Location:  [facility] Procedure: 2D Echo, Cardiac Doppler, Color Doppler and Strain Analysis Indications:     Acute myocardial infarction, unspecified I21.9  History:         Patient has prior history of Echocardiogram examinations. Risk                  Factors:Diabetes.  Sonographer:     Neysa Bonito  Roar Referring Phys:  [id number] Kelsa Jaworowski A Janaiya Beauchesne Diagnosing Phys: Debbe Odea MD  Sonographer Comments: Global longitudinal strain was attempted. IMPRESSIONS  1. Left ventricular ejection fraction, by estimation, is 55 to 60%. The left ventricle has normal function. The left ventricle has no regional wall motion abnormalities. Left ventricular diastolic parameters were normal. The average left ventricular global longitudinal strain is -16.9 %. The global longitudinal strain is normal.  2. Right ventricular systolic function is normal. The right ventricular size is normal.  3. The mitral valve is normal in structure. No evidence of mitral valve regurgitation.  4. The aortic valve was not well visualized. Aortic valve regurgitation is not visualized.  5. The inferior vena cava is normal in size with greater than 50% respiratory variability, suggesting right atrial pressure of 3 mmHg. FINDINGS  Left Ventricle: Left ventricular ejection fraction, by estimation, is 55 to 60%. The left ventricle has normal function. The left  ventricle has no regional wall motion abnormalities. The average left ventricular global longitudinal strain is -16.9 %. The global longitudinal strain is normal. The left ventricular internal cavity size was normal in size. There is no left ventricular hypertrophy. Left ventricular diastolic parameters were normal. Right Ventricle: The right ventricular size is normal. No increase in right ventricular wall thickness. Right ventricular systolic function is normal. Left Atrium: Left atrial size was normal in size. Right Atrium: Right atrial size was normal in size. Pericardium: There is no evidence of pericardial effusion. Mitral Valve: The mitral valve is normal in structure. No evidence of mitral valve regurgitation. MV peak gradient, 2.8 mmHg. The mean mitral valve gradient is 1.0 mmHg. Tricuspid Valve: The tricuspid valve is normal in structure. Tricuspid valve regurgitation is not demonstrated. Aortic Valve: The aortic valve was not well visualized. Aortic valve regurgitation is not visualized. Aortic valve mean gradient measures 5.0 mmHg. Aortic valve peak gradient measures 8.2 mmHg. Aortic valve area, by VTI measures 2.98 cm. Pulmonic Valve: The pulmonic valve was not well visualized. Pulmonic valve regurgitation is not visualized. Aorta: The aortic root was not well visualized. Venous: The inferior vena cava is normal in size with greater than 50% respiratory variability, suggesting right atrial pressure of 3 mmHg. IAS/Shunts: No atrial level shunt detected by color flow Doppler.  LEFT VENTRICLE PLAX 2D LVIDd:         5.00 cm   Diastology LVIDs:         3.60 cm   LV e' medial:    8.81 cm/s LV PW:         1.00 cm   LV E/e' medial:  8.7 LV IVS:        1.00 cm   LV e' lateral:   12.20 cm/s LVOT diam:     2.00 cm   LV E/e' lateral: 6.3 LV SV:         81 LV SV Index:   38        2D Longitudinal Strain LVOT Area:     3.14 cm  2D Strain GLS Avg:     -16.9 %  RIGHT VENTRICLE RV Basal diam:  2.80 cm RV Mid diam:     3.00 cm RV S prime:     10.80 cm/s TAPSE (M-mode): 2.1 cm LEFT ATRIUM             Index        RIGHT ATRIUM           Index LA diam:  3.70 cm 1.71 cm/m   RA Area:     12.70 cm LA Vol (A2C):   67.9 ml 31.42 ml/m  RA Volume:   27.80 ml  12.86 ml/m LA Vol (A4C):   52.2 ml 24.16 ml/m LA Biplane Vol: 60.0 ml 27.77 ml/m  AORTIC VALVE                     PULMONIC VALVE AV Area (Vmax):    2.83 cm      PV Vmax:        0.96 m/s AV Area (Vmean):   2.47 cm      PV Peak grad:   3.7 mmHg AV Area (VTI):     2.98 cm      RVOT Peak grad: 3 mmHg AV Vmax:           143.00 cm/s AV Vmean:          104.000 cm/s AV VTI:            0.272 m AV Peak Grad:      8.2 mmHg AV Mean Grad:      5.0 mmHg LVOT Vmax:         129.00 cm/s LVOT Vmean:        81.800 cm/s LVOT VTI:          0.258 m LVOT/AV VTI ratio: 0.95 MITRAL VALVE MV Area (PHT): 4.39 cm    SHUNTS MV Area VTI:   3.27 cm    Systemic VTI:  0.26 m MV Peak grad:  2.8 mmHg    Systemic Diam: 2.00 cm MV Mean grad:  1.0 mmHg MV Vmax:       0.84 m/s MV Vmean:      57.0 cm/s MV Decel Time: 173 msec MV E velocity: 77.00 cm/s MV A velocity: 59.70 cm/s MV E/A ratio:  1.29 MV A Prime:    10.9 cm/s Debbe Odea MD Electronically signed by Debbe Odea MD Signature Date/Time: 07/21/2023/3:01:09 PM    Final    CARDIAC CATHETERIZATION  Result Date: 07/20/2023   Prox RCA to Mid RCA lesion is 30% stenosed.   RPDA lesion is 50% stenosed.   Dist Cx lesion is 90% stenosed.   3rd Mrg lesion is 70% stenosed.   Mid LAD lesion is 30% stenosed.   1st Diag lesion is 100% stenosed.   2nd Diag lesion is 100% stenosed.   Previously placed Ost LAD to Mid LAD stent of unknown type is  widely patent.   A drug-eluting stent was successfully placed using a STENT ONYX FRONTIER 2.5X18.   A drug-eluting stent was successfully placed using a STENT ONYX FRONTIER 2.0X15.   Post intervention, there is a 0% residual stenosis.   Post intervention, there is a 0% residual stenosis.   There is mild to  moderate left ventricular systolic dysfunction.   LV end diastolic pressure is moderately elevated.   The left ventricular ejection fraction is 35-45% by visual estimate. 1.  Patent LAD and RCA stents with mild in-stent restenosis.  Chronically occluded first and second diagonal.  80 to 90% hazy stenosis in the distal left circumflex at the bifurcation of an OM branch seems to be the culprit for inferior/inferolateral ST elevation myocardial infarction. 2.  Mildly to moderately reduced LV systolic function with moderately elevated left ventricular end-diastolic pressure. 3.  Successful complex bifurcation angioplasty utilizing the reverse mini crush technique to the distal left circumflex/OM bifurcation.  Initially, a stent was placed in  the distal left circumflex but there was loss of flow in the OM branch and thus I elected to treat given that the patient was highly symptomatic. Recommendations: Dual antiplatelet therapy for at least 12 months and preferably indefinitely. Aggrastat infusion for 12 hours given risk of stent thrombosis in the setting of vomiting. Nitroglycerin drip for pain control. Check urine drug screen. Aggressive treatment of risk factors and smoking cessation.     Assessment and Plan:   Inferolateral ST elevation myocardial infarction due to subacute stent thrombosis: Emergent cardiac catheterization was done via the right radial artery which showed nonocclusive thrombus in the distal left circumflex stent with patent stent in the OM branch.  There was evidence of thrombus.  IVUS was performed which showed stent mal-apposition proximally with no evidence of edge dissection.  This was treated with a noncompliant balloon to high pressure with resolution of thrombus and excellent angiographic appearance.  The patient was given 1 bolus of Aggrastat.  Continue dual antiplatelet therapy with aspirin and ticagrelor.  Given her history of severe gastroparesis, I am concerned about absorption of  oral medications including antiplatelet medications. Hyperlipidemia: Her LDL was 165 and her LP(a) was 166.  This is in spite of being on atorvastatin.  LP(a) is known to be a risk factor for stent thrombosis and it can be decreased with a PCSK9 inhibitor.  Recommend pharmacy consult tomorrow to see if the patient can be initiated on a PCSK9 inhibitor before hospital discharge. Tobacco use: I discussed the importance of smoking cessation. Recent urine drug screen was positive for amphetamine.  Positivity for opiate and benzodiazepine are expected given that she was treated with dose in the Cath Lab.  The patient reports only intermittent marijuana use.  Not entirely sure why amphetamine was positive.  She denies using any supplements.  Will need to check with our pharmacy to see if there is any cross-reactivity with her medications.  Amphetamine could be causing vasoconstriction as well.    For questions or updates, please contact [facility] Please consult [email or web address] for contact info under    Signed, Lorine Bears, MD  07/23/2023 8:58 PM

## 2023-07-23 NOTE — ED Triage Notes (Signed)
Pt presents to the ED via POV from home with significant other. Pt was discharged yesterday after an MI and stent placement. This RN obtained EKG to find that EKG read STEMI. Pt immediately placed in room with MD at bedside.

## 2023-07-23 NOTE — H&P (Addendum)
[place]   PATIENT NAME: Karen Ruiz    MR#:  [id number]  DATE OF BIRTH:  23-Jul-1981  DATE OF ADMISSION:  07/23/2023  PRIMARY CARE PHYSICIAN: Dan Humphreys, MD   Patient is coming from: Home  REQUESTING/REFERRING PHYSICIAN: Lemmie Evens, MD  CHIEF COMPLAINT:   Chief Complaint  Patient presents with   Code STEMI    HISTORY OF PRESENT ILLNESS:  Karen Ruiz is a 42 y.o. Caucasian female with medical history significant for type II diabetes mellitus, with diabetic gastroparesis, history of cocaine abuse, Barrett's esophagus, migraine as well as coronary artery disease s/p MI in 2022 s/p PCI and DES in the RCA and another stent in the proximal/mid LAD with [facility] cardiolog, who just had a PCI and stent of her LCA and OM branch on 10/20-24 and was discharged yesterday.  She presented to the emergency room with acute onset of recurrent midsternal chest pain with associated nausea and diaphoresis and radiation to her left arm.  Pain started around 5 PM today.  No fever or chills.  No cough or wheezing or hemoptysis.  She stated that she gets significant pain with her gastroparesis that she vomits and is concerned about not keeping her antiplatelet therapy.  She was discharged on aspirin and Brilinta.  No dysuria, liquidy or hematuria or flank pain.  No other bleeding diathesis.  ED Course: Patient into the ER, BP was 155/102 respiratory to 23 and otherwise normal vital signs.  Labs revealed mild hyponatremia and a blood glucose of 326, high sensitive troponin I of 811 and CBC with leukocytosis of 12 with neutrophilia as well as hemoglobin 11.8 hematocrit 38.1 above previous levels yesterday. EKG as reviewed by me : EKG showed inferolateral ST segment elevation with reciprocal ST segment depression in V1 and V2. Imaging: None.  The patient was taken to the Cath Lab and underwent PCI and successful balloon angioplasty of the proximal segment of the left circumflex  stent.  She was found to have subacute stent thrombosis in the distal left circumflex with patent stent in the OM branch.  Stents in the LAD and RCA were patent.  It showed mildly reduced left ventricular systolic function with mildly elevated left ventricular end-diastolic pressure.  The patient is being admitted to the ICU for further management. PAST MEDICAL HISTORY:   Past Medical History:  Diagnosis Date   Barrett esophagus    Diabetes mellitus without complication (HCC)    Gastroparesis    Migraines    MRSA (methicillin resistant staph aureus) culture positive    2009   Obesity     PAST SURGICAL HISTORY:   Past Surgical History:  Procedure Laterality Date   CARDIAC CATHETERIZATION     CESAREAN SECTION     CORONARY STENT INTERVENTION N/A 05/02/2021   Procedure: CORONARY STENT INTERVENTION;  Surgeon: Yvonne Kendall, MD;  Location: [facility] INVASIVE CV LAB;  Service: Cardiovascular;  Laterality: N/A;   CORONARY ULTRASOUND/IVUS N/A 05/02/2021   Procedure: Intravascular Ultrasound/IVUS;  Surgeon: Yvonne Kendall, MD;  Location: [facility] INVASIVE CV LAB;  Service: Cardiovascular;  Laterality: N/A;   CORONARY/GRAFT ACUTE MI REVASCULARIZATION N/A 07/20/2023   Procedure: Coronary/Graft Acute MI Revascularization;  Surgeon: Iran Ouch, MD;  Location: [facility] INVASIVE CV LAB;  Service: Cardiovascular;  Laterality: N/A;   ESOPHAGOGASTRODUODENOSCOPY N/A 07/28/2019   Procedure: ESOPHAGOGASTRODUODENOSCOPY (EGD);  Surgeon: Toney Reil, MD;  Location: [facility] ENDOSCOPY;  Service: Gastroenterology;  Laterality: N/A;   LEFT HEART CATH AND CORONARY ANGIOGRAPHY  N/A 05/02/2021   Procedure: LEFT HEART CATH AND CORONARY ANGIOGRAPHY;  Surgeon: Yvonne Kendall, MD;  Location: [facility] INVASIVE CV LAB;  Service: Cardiovascular;  Laterality: N/A;   LEFT HEART CATH AND CORONARY ANGIOGRAPHY N/A 07/20/2023   Procedure: LEFT HEART CATH AND CORONARY ANGIOGRAPHY;  Surgeon: Iran Ouch, MD;  Location:  [facility] INVASIVE CV LAB;  Service: Cardiovascular;  Laterality: N/A;    SOCIAL HISTORY:   Social History   Tobacco Use   Smoking status: Some Days    Current packs/day: 0.50    Types: Cigarettes   Smokeless tobacco: Never  Substance Use Topics   Alcohol use: Yes    FAMILY HISTORY:   Family History  Problem Relation Age of Onset   Diabetes Mother    Cancer Mother    Heart failure Mother    Hypertension Mother    Heart attack Father    Diabetes Father    Hypertension Father     DRUG ALLERGIES:   Allergies  Allergen Reactions   Quinolones Other (See Comments)    REVIEW OF SYSTEMS:   ROS As per history of present illness. All pertinent systems were reviewed above. Constitutional, HEENT, cardiovascular, respiratory, GI, GU, musculoskeletal, neuro, psychiatric, endocrine, integumentary and hematologic systems were reviewed and are otherwise negative/unremarkable except for positive findings mentioned above in the HPI.   MEDICATIONS AT HOME:   Prior to Admission medications   Medication Sig Start Date End Date Taking? Authorizing Provider  aspirin EC 81 MG EC tablet Take 1 tablet (81 mg total) by mouth daily. Swallow whole. 05/04/21  Yes Delfino Lovett, MD  atorvastatin (LIPITOR) 80 MG tablet Take 1 tablet (80 mg total) by mouth daily. 07/22/23 10/20/23 Yes Arnetha Courser, MD  escitalopram (LEXAPRO) 10 MG tablet Take 1 tablet (10 mg total) by mouth daily. 07/22/23 10/20/23 Yes Arnetha Courser, MD  insulin aspart (NOVOLOG) 100 UNIT/ML injection Inject 0-9 Units into the skin 3 (three) times daily with meals. 07/16/19  Yes Katha Hamming, MD  metoprolol succinate (TOPROL-XL) 100 MG 24 hr tablet Take 1 tablet (100 mg total) by mouth daily. Take with or immediately following a meal. PLEASE SCHEDULE OFFICE VISIT FOR FURTHER REFILLS. THANK YOU! 07/22/23 10/20/23 Yes Arnetha Courser, MD  pantoprazole (PROTONIX) 40 MG tablet Take 1 tablet (40 mg total) by mouth daily. PLEASE SCHEDULE OFFICE  VISIT FOR FURTHER REFILLS. THANK YOU! 07/22/23 10/20/23 Yes Arnetha Courser, MD  promethazine (PHENERGAN) 25 MG suppository Place 1 suppository (25 mg total) rectally every 8 (eight) hours as needed for refractory nausea / vomiting. 06/29/21  Yes Shaune Pollack, MD  ticagrelor (BRILINTA) 90 MG TABS tablet Take 1 tablet (90 mg total) by mouth 2 (two) times daily. 07/22/23  Yes Arnetha Courser, MD      VITAL SIGNS:  Blood pressure (!) 145/85, pulse 80, temperature 98.1 F (36.7 C), temperature source Oral, resp. rate 13, height 5\' 6"  (1.676 m), weight 108 kg, SpO2 100%.  PHYSICAL EXAMINATION:  Physical Exam  GENERAL:  42 y.o.-year-old patient lying in the bed with no acute distress.  EYES: Pupils equal, round, reactive to light and accommodation. No scleral icterus. Extraocular muscles intact.  HEENT: Head atraumatic, normocephalic. Oropharynx and nasopharynx clear.  NECK:  Supple, no jugular venous distention. No thyroid enlargement, no tenderness.  LUNGS: Normal breath sounds bilaterally, no wheezing, rales,rhonchi or crepitation. No use of accessory muscles of respiration.  CARDIOVASCULAR: Regular rate and rhythm, S1, S2 normal. No murmurs, rubs, or gallops.  ABDOMEN: Soft, nondistended, nontender. Bowel sounds  present. No organomegaly or mass.  EXTREMITIES: No pedal edema, cyanosis, or clubbing.  NEUROLOGIC: Cranial nerves II through XII are intact. Muscle strength 5/5 in all extremities. Sensation intact. Gait not checked.  PSYCHIATRIC: The patient is alert and oriented x 3.  Normal affect and good eye contact. SKIN: No obvious rash, lesion, or ulcer.   LABORATORY PANEL:   CBC Recent Labs  Lab 07/23/23 2022  WBC 12.0*  HGB 11.8*  HCT 38.1  PLT 394   ------------------------------------------------------------------------------------------------------------------  Chemistries  Recent Labs  Lab 07/23/23 2022  NA 132*  K 5.0  CL 101  CO2 22  GLUCOSE 326*  BUN 16  CREATININE  0.61  0.57  CALCIUM 8.4*   ------------------------------------------------------------------------------------------------------------------  Cardiac Enzymes No results for input(s): "TROPONINI" in the last 168 hours. ------------------------------------------------------------------------------------------------------------------  RADIOLOGY:  CARDIAC CATHETERIZATION  Result Date: 07/23/2023   Mid LAD lesion is 30% stenosed.   Prox RCA to Mid RCA lesion is 30% stenosed.   1st Diag lesion is 100% stenosed.   2nd Diag lesion is 100% stenosed.   RPDA lesion is 50% stenosed.   Dist Cx lesion is 50% stenosed.   Non-stenotic Ost LAD to Mid LAD lesion was previously treated.   Non-stenotic 3rd Mrg lesion was previously treated.   Balloon angioplasty was performed using a BALLN [place] TREK NEO RX 3.0X12.   Post intervention, there is a 0% residual stenosis.   There is mild left ventricular systolic dysfunction.   LV end diastolic pressure is mildly elevated.   The left ventricular ejection fraction is 45-50% by visual estimate. 1.  Subacute stent thrombosis in the distal left circumflex with patent stent in the OM branch.  IVUS showed evidence of stent mall apposition proximally.  No evidence of edge dissection.  Patent stents in the LAD and RCA. 2.  Mildly reduced LV systolic function with mildly elevated left ventricular end-diastolic pressure. 3.  Successful balloon angioplasty to the proximal segment of the left circumflex stent using a 3.0 x 12 mm noncompliant balloon to 16 atm. Recommendations: Continue dual antiplatelet therapy indefinitely. Aggressive treatment of risk factors and consider a PCSK9 inhibitor given elevated LDL and LP(a).      IMPRESSION AND PLAN:  Assessment and Plan: Acute ST elevation myocardial infarction (STEMI) involving other coronary artery of inferior wall [facility]) - The patient is admitted to the ICU for further evaluation and management. - The patient is status post PCI and  2 stents placement by Dr. Kirke Corin as mentioned above. - She will be continued on IV Aggrastat for 12 hours. - She will be continued on Brilinta, high-dose statin therapy and beta-blocker therapy. - She will be on aspirin as well as as needed sublingual nitroglycerin and IV morphine sulfate for pain. - Cardiology follow-up consult will be obtained.  Diabetic gastroparesis associated with type 2 diabetes mellitus (HCC) - This could be the culprit for lack of absorption of her dual antiplatelet therapy. - We will obtain a GI consultation while she is here. - I notified Dr. Mia Creek about the patient. - Will be placed on scheduled IV Reglan. - We will add IV erythromycin AC 3 times daily. - Will place on supplemental coverage with NovoLog. - We will continue basal coverage.    Essential hypertension - We will continue her antihypertensive therapy.   Dyslipidemia - The patient will be placed on high-dose statin therapy.     DVT prophylaxis: Lovenox.  Advanced Care Planning:  Code Status: full code.  Family Communication:  The plan of care was discussed in details with the patient (and family). I answered all questions. The patient agreed to proceed with the above mentioned plan. Further management will depend upon hospital course. Disposition Plan: Back to previous home environment Consults called: Cardiology and GI All the records are reviewed and case discussed with ED provider.  Status is: Inpatient  At the time of the admission, it appears that the appropriate admission status for this patient is inpatient.  This is judged to be reasonable and necessary in order to provide the required intensity of service to ensure the patient's safety given the presenting symptoms, physical exam findings and initial radiographic and laboratory data in the context of comorbid conditions.  The patient requires inpatient status due to high intensity of service, high risk of further deterioration and high  frequency of surveillance required.  I certify that at the time of admission, it is my clinical judgment that the patient will require inpatient hospital care extending more than 2 midnights.                            Dispo: The patient is from: Home              Anticipated d/c is to: Home              Patient currently is not medically stable to d/c.              Difficult to place patient: No  Hannah Beat M.D on 07/23/2023 at 10:06 PM  [facility]   From 7 PM-7 AM, contact night-coverage [email or web address]  CC: Primary care physician; Dan Humphreys, MD

## 2023-07-23 NOTE — ED Notes (Signed)
Pt transported to cath lab at this time.

## 2023-07-23 NOTE — Progress Notes (Signed)
   07/23/23 1900  Spiritual Encounters  Type of Visit Attempt (pt unavailable)  OnCall Visit Yes  Spiritual Care Plan  Spiritual Care Issues Still Outstanding Chaplain will continue to follow   Was on a death visit at the time the call first came in the ED. When to visit patient later and they were still being seen by medical team. I will follow up with patient later to assess the PT or family spiritual needs.

## 2023-07-23 NOTE — Plan of Care (Signed)

## 2023-07-23 NOTE — ED Notes (Signed)
CODE  STEMI  CALLED  TO  CAR[facility] 

## 2023-07-23 NOTE — ED Notes (Signed)
Dr. Arida at bedside 

## 2023-07-23 NOTE — ED Provider Notes (Signed)
[place] Provider Note    Event Date/Time   First MD Initiated Contact with Patient 07/23/23 1818     (approximate)   History   Code STEMI   HPI Karen Ruiz is a 42 y.o. female with history of STEMI s/p PCI x 5 most recently 3 days ago presenting today for chest pain.  Acute onset approximately 30 minutes for arrival associated with pain down her left arm.  Initially no shortness of breath, nausea, or vomiting.  Getting diaphoretic in the room.  EKG read while patient was in triage and code STEMI was activated.  Chart review performed of most recent hospitalization 3 days ago which she was just discharged from yesterday.  Compared most recent EKGs.     Physical Exam   Triage Vital Signs: ED Triage Vitals  Encounter Vitals Group     BP 07/23/23 1824 (!) 172/132     Systolic BP Percentile --      Diastolic BP Percentile --      Pulse Rate 07/23/23 1824 (!) 113     Resp 07/23/23 1824 20     Temp 07/23/23 1835 99 F (37.2 C)     Temp Source 07/23/23 1835 Oral     SpO2 07/23/23 1824 100 %     Weight 07/23/23 1822 238 lb 1.6 oz (108 kg)     Height 07/23/23 1822 5\' 6"  (1.676 m)     Head Circumference --      Peak Flow --      Pain Score 07/23/23 1822 10     Pain Loc --      Pain Education --      Exclude from Growth Chart --     Most recent vital signs: Vitals:   07/23/23 1830 07/23/23 1835  BP: (!) 181/130   Pulse: (!) 109   Resp: (!) 22   Temp:  99 F (37.2 C)  SpO2: 100%    Physical Exam: I have reviewed the vital signs and nursing notes. General: Awake, alert, in distress and actively in pain.  Diaphoretic. Head:  Atraumatic, normocephalic.   ENT:  EOM intact, PERRL. Oral mucosa is pink and moist with no lesions. Neck: Neck is supple with full range of motion, No meningeal signs. Cardiovascular:  RRR, No murmurs. Peripheral pulses palpable and equal bilaterally. Respiratory:  Symmetrical chest wall expansion.  No rhonchi,  rales, or wheezes.  Good air movement throughout.  No use of accessory muscles.   Musculoskeletal:  No cyanosis or edema. Moving extremities with full ROM Abdomen:  Soft, nontender, nondistended. Neuro:  GCS 15, moving all four extremities, interacting appropriately. Speech clear. Psych:  Calm, appropriate.   Skin:  Warm, dry, no rash.    ED Results / Procedures / Treatments   Labs (all labs ordered are listed, but only abnormal results are displayed) Labs Reviewed  CBC WITH DIFFERENTIAL/PLATELET  BASIC METABOLIC PANEL  TROPONIN I (HIGH SENSITIVITY)     EKG My EKG interpretation: Rate of 88, STEMI.  ST elevations present in lead II, III, aVF, V4, V5, and V6.  ST depressions present in aVL, V1, and V2.   RADIOLOGY   PROCEDURES:  Critical Care performed: Yes, see critical care procedure note(s)  .Critical Care  Performed by: Janith Lima, MD Authorized by: Janith Lima, MD   Critical care provider statement:    Critical care time (minutes):  30   Critical care was necessary to treat or prevent imminent or  life-threatening deterioration of the following conditions:  Cardiac failure and circulatory failure (STEMI)   Critical care was time spent personally by me on the following activities:  Development of treatment plan with patient or surrogate, discussions with consultants, evaluation of patient's response to treatment, examination of patient, ordering and review of laboratory studies, ordering and review of radiographic studies, ordering and performing treatments and interventions, pulse oximetry, re-evaluation of patient's condition and review of old charts   Care discussed with: admitting provider      MEDICATIONS ORDERED IN ED: Medications  fentaNYL (SUBLIMAZE) 50 MCG/ML injection (has no administration in time range)  fentaNYL (SUBLIMAZE) injection 50 mcg (50 mcg Intravenous Given 07/23/23 1825)  fentaNYL (SUBLIMAZE) injection 50 mcg (50 mcg Intravenous Given  07/23/23 1829)  aspirin EC tablet 325 mg (325 mg Oral Given 07/23/23 1825)  fentaNYL (SUBLIMAZE) injection 50 mcg (50 mcg Intravenous Given 07/23/23 1835)  heparin injection 4,000 Units (4,000 Units Intravenous Given 07/23/23 1842)     IMPRESSION / MDM / ASSESSMENT AND PLAN / ED COURSE  I reviewed the triage vital signs and the nursing notes.                              Differential diagnosis includes, but is not limited to, STEMI, pericarditis, Dressler syndrome  Patient's presentation is most consistent with acute presentation with potential threat to life or bodily function.  Patient is a 42 year old female presenting today for acute onset chest pain with radiation down the left arm.  An EKG on arrival shows ST elevations meeting STEMI criteria.  Present in the inferior and lateral leads.  Fentanyl was given and holding off on nitro as patient may be preload dependent.  STEMI alert was called.  Patient given aspirin and heparin bolus.  Patient taken to Cath Lab for further evaluation by Dr. Kirke Corin.  The patient is on the cardiac monitor to evaluate for evidence of arrhythmia and/or significant heart rate changes.     FINAL CLINICAL IMPRESSION(S) / ED DIAGNOSES   Final diagnoses:  ST elevation myocardial infarction (STEMI), unspecified artery (HCC)     Rx / DC Orders   ED Discharge Orders     None        Note:  This document was prepared using Dragon voice recognition software and may include unintentional dictation errors.   Janith Lima, MD 07/23/23 Mikle Bosworth

## 2023-07-23 NOTE — ED Notes (Signed)
aX [id number])

## 2023-07-24 ENCOUNTER — Encounter: Payer: Self-pay | Admitting: Cardiovascular Disease

## 2023-07-24 ENCOUNTER — Other Ambulatory Visit (HOSPITAL_COMMUNITY): Payer: Self-pay

## 2023-07-24 DIAGNOSIS — F603 Borderline personality disorder: Secondary | ICD-10-CM

## 2023-07-24 DIAGNOSIS — I2119 ST elevation (STEMI) myocardial infarction involving other coronary artery of inferior wall: Secondary | ICD-10-CM | POA: Diagnosis not present

## 2023-07-24 DIAGNOSIS — I213 ST elevation (STEMI) myocardial infarction of unspecified site: Secondary | ICD-10-CM | POA: Diagnosis not present

## 2023-07-24 DIAGNOSIS — E118 Type 2 diabetes mellitus with unspecified complications: Secondary | ICD-10-CM | POA: Diagnosis not present

## 2023-07-24 DIAGNOSIS — E1143 Type 2 diabetes mellitus with diabetic autonomic (poly)neuropathy: Secondary | ICD-10-CM | POA: Diagnosis not present

## 2023-07-24 LAB — CBC
HCT: 34.3 % — ABNORMAL LOW (ref 36.0–46.0)
Hemoglobin: 10.3 g/dL — ABNORMAL LOW (ref 12.0–15.0)
MCH: 22.4 pg — ABNORMAL LOW (ref 26.0–34.0)
MCHC: 30 g/dL (ref 30.0–36.0)
MCV: 74.6 fL — ABNORMAL LOW (ref 80.0–100.0)
Platelets: 356 10*3/uL (ref 150–400)
RBC: 4.6 MIL/uL (ref 3.87–5.11)
RDW: 16.5 % — ABNORMAL HIGH (ref 11.5–15.5)
WBC: 10.4 10*3/uL (ref 4.0–10.5)
nRBC: 0 % (ref 0.0–0.2)

## 2023-07-24 LAB — BASIC METABOLIC PANEL
Anion gap: 9 (ref 5–15)
BUN: 16 mg/dL (ref 6–20)
CO2: 20 mmol/L — ABNORMAL LOW (ref 22–32)
Calcium: 8 mg/dL — ABNORMAL LOW (ref 8.9–10.3)
Chloride: 102 mmol/L (ref 98–111)
Creatinine, Ser: 0.52 mg/dL (ref 0.44–1.00)
GFR, Estimated: >60 mL/min (ref >60)
Glucose, Bld: 409 mg/dL — ABNORMAL HIGH (ref 70–99)
Potassium: 3.9 mmol/L (ref 3.5–5.1)
Sodium: 131 mmol/L — ABNORMAL LOW (ref 135–145)

## 2023-07-24 LAB — GLUCOSE, CAPILLARY
Glucose-Capillary: 297 mg/dL — ABNORMAL HIGH (ref 70–99)
Glucose-Capillary: 232 mg/dL — ABNORMAL HIGH (ref 70–99)
Glucose-Capillary: 222 mg/dL — ABNORMAL HIGH (ref 70–99)

## 2023-07-24 MED ORDER — CHLORHEXIDINE GLUCONATE CLOTH 2 % EX PADS
6.0000 | MEDICATED_PAD | Freq: Every day | CUTANEOUS | Status: DC
  Administered 2023-07-24: 6 via TOPICAL

## 2023-07-24 MED ORDER — INSULIN GLARGINE-YFGN 100 UNIT/ML ~~LOC~~ SOLN
15.0000 [IU] | Freq: Once | SUBCUTANEOUS | Status: AC
  Administered 2023-07-24: 15 [IU] via SUBCUTANEOUS
  Filled 2023-07-24: qty 0.15

## 2023-07-24 MED ORDER — MORPHINE SULFATE (PF) 2 MG/ML IV SOLN
2.0000 mg | INTRAVENOUS | Status: DC | PRN
  Administered 2023-07-24 (×2): 2 mg via INTRAVENOUS
  Filled 2023-07-24 (×2): qty 1

## 2023-07-24 MED ORDER — MORPHINE SULFATE (PF) 2 MG/ML IV SOLN
2.0000 mg | Freq: Once | INTRAVENOUS | Status: AC
  Administered 2023-07-24: 2 mg via INTRAVENOUS
  Filled 2023-07-24: qty 1

## 2023-07-24 MED ORDER — ASPIRIN 81 MG PO TBEC: 81.0000 mg | DELAYED_RELEASE_TABLET | Freq: Once | ORAL | Status: DC

## 2023-07-24 MED ORDER — LORAZEPAM 2 MG/ML IJ SOLN
0.5000 mg | INTRAMUSCULAR | Status: DC | PRN
  Administered 2023-07-24: 0.5 mg via INTRAVENOUS
  Filled 2023-07-24: qty 1

## 2023-07-24 MED ORDER — INSULIN ASPART 100 UNIT/ML IJ SOLN: 0.0000 [IU] | Freq: Every day | INTRAMUSCULAR | Status: DC

## 2023-07-24 MED ORDER — LORAZEPAM 0.5 MG PO TABS: 0.5000 mg | ORAL_TABLET | ORAL | Status: DC | PRN

## 2023-07-24 MED ORDER — ASPIRIN 81 MG PO CHEW
81.0000 mg | CHEWABLE_TABLET | Freq: Once | ORAL | Status: AC
  Administered 2023-07-24: 81 mg via ORAL
  Filled 2023-07-24: qty 1

## 2023-07-24 MED ORDER — TICAGRELOR 90 MG PO TABS
90.0000 mg | ORAL_TABLET | Freq: Once | ORAL | Status: AC
  Administered 2023-07-24: 90 mg via ORAL
  Filled 2023-07-24: qty 1

## 2023-07-24 MED ORDER — HYDROMORPHONE HCL 1 MG/ML IJ SOLN
2.0000 mg | Freq: Once | INTRAMUSCULAR | Status: AC
  Administered 2023-07-24: 2 mg via INTRAVENOUS
  Filled 2023-07-24: qty 2

## 2023-07-24 MED ORDER — INSULIN ASPART 100 UNIT/ML IJ SOLN
0.0000 [IU] | Freq: Three times a day (TID) | INTRAMUSCULAR | Status: DC
  Filled 2023-07-24: qty 1

## 2023-07-24 NOTE — Evaluation (Signed)
Physical Therapy Evaluation Patient Details Name: Karen Ruiz MRN: [id number] DOB: May 21, 1981 Today's Date: 07/24/2023  History of Present Illness  42 y/o female presented to ED on 07/23/23 for STEMI. Recent admission 10/19-10/21 for STEMI with stent placement. PMH: T2DM, extensive cardiac history with hx of MI, hx of cocaine abuse  Clinical Impression  Patient admitted with the above. 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 Patient continues to function at independent level with no AD. HR up to 98 with mobility. No complaints of chest pain with activity. No further skilled PT needs identified acutely. PT will complete orders.         Equipment Recommendations None recommended by PT     Functional Status Assessment Patient has not had a recent decline in their functional status     Precautions / Restrictions Precautions Precautions: None Restrictions Weight Bearing Restrictions: No      Mobility  Bed Mobility Overal bed mobility: Independent      Transfers Overall transfer level: Independent       Ambulation/Gait Ambulation/Gait assistance: Independent Gait Distance (Feet): 450 Feet Assistive device: None Gait Pattern/deviations: WFL(Within Functional Limits)   Gait velocity interpretation: >4.37 ft/sec, indicative of normal walking speed          Balance Overall balance assessment: Independent         Pertinent Vitals/Pain Pain Assessment Pain Assessment: Faces Faces Pain Scale: Hurts a little bit Pain Location: abdomen Pain Descriptors / Indicators: Guarding Pain Intervention(s): Monitored during session    Home Living Family/patient expects to be discharged to:: Private residence Living Arrangements: Spouse/significant other;Children Available Help at Discharge: Family Type of Home: House Home Access: Stairs to enter   Secretary/administrator of Steps: 4    Home Layout: One level        Prior Function Prior Level of Function : Independent/Modified Independent;Working/employed             Mobility Comments: 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       Extremity/Trunk Assessment   Upper Extremity Assessment Upper Extremity Assessment: Overall WFL for tasks assessed    Lower Extremity Assessment Lower Extremity Assessment: Overall WFL for tasks assessed    Cervical / Trunk Assessment Cervical / Trunk Assessment: Normal  Communication   Communication Communication: No apparent difficulties  Cognition Arousal: Alert Behavior During Therapy: WFL for tasks assessed/performed Overall Cognitive Status: Within Functional Limits for tasks assessed             Assessment/Plan    PT Assessment Patient does not need any further PT services  PT Problem List         PT Treatment Interventions      PT Goals (Current goals can be found in the Care Plan section)  Acute Rehab PT Goals Patient Stated Goal: to stay another night then go home PT Goal Formulation: All assessment and education complete, DC therapy     AM-PAC PT "6 Clicks" Mobility  Outcome Measure Help needed turning from your back to your side while in a flat bed without using bedrails?: None Help needed moving from lying on your back to sitting on the side of a flat bed without using bedrails?: None Help needed moving to and from a bed to a chair (including a wheelchair)?: None Help needed standing up from a chair using your arms (e.g., wheelchair or bedside chair)?: None Help needed to walk in hospital  room?: None Help needed climbing 3-5 steps with a railing? : None 6 Click Score: 24    End of Session   Activity Tolerance: Patient tolerated treatment well Patient left: in bed;with call bell/phone within reach Nurse Communication: Mobility status PT Visit Diagnosis: Muscle weakness (generalized) (M62.81)    Time: 4742-5956 PT Time Calculation  (min) (ACUTE ONLY): 17 min   Charges:   PT Evaluation $PT Eval Low Complexity: 1 Low   PT General Charges $$ ACUTE PT VISIT: 1 Visit         Maylon Peppers, PT, DPT Physical Therapist - [facility]  [facility]   Dyllan Kats A Cardin Nitschke 07/24/2023, 1:06 PM

## 2023-07-24 NOTE — Progress Notes (Signed)
Pt complains of severe abdominal pain; rocking back and forth in bed. Husband at bedside stating that Dilaudid works better and doesn't know why it was changed to Morphine. Offered to speak with physician prior to given morphine, but pt stated wants to go ahead with the morphine. Declines po meds at this time; states po meds will make her nauseous. Decline zofran.

## 2023-07-24 NOTE — Progress Notes (Signed)
Rounding Note    Patient Name: Karen Ruiz EPPIRJJOAC Date of Encounter: 07/24/2023  [facility] Cardiologist: Bud Face  Subjective   Resting comfortably in bed, husband at the bedside No active chest pain Reports history of gastroparesis, extensive outpatient workup Recently seen in [facility] emergency room June 19, 2023 with abdominal pain, nausea vomiting Troponin at that time 432  Recent events reviewed Admitted October 19th with inferior STEMI Emergent cardiac catheterization performed: showed patent stents in the LAD and RCA with mild restenosis.  The first and second diagonals appeared to be occluded but these were described before.   hazy stenosis in the distal left circumflex at the bifurcation of a large OM branch.  Initially a stent was placed to the distal left circumflex but there was loss of flow in the OM branch and bifurcation stenting was performed (reverse mini crush technique).   Discharged yesterday July 23, 2023 with readmission that evening with anginal symptoms, taken to the catheterization lab Urine tox screen positive amphetamines Cath results : nonocclusive thrombus in the distal left circumflex stent with patent stent in the OM branch.  There was evidence of thrombus.  IVUS was performed which showed stent mal-apposition proximally with no evidence of edge dissection.  This was treated with a noncompliant balloon to high pressure with resolution of thrombus and excellent angiographic appearance.    Inpatient Medications    Scheduled Meds:  aspirin EC  81 mg Oral Daily   atorvastatin  80 mg Oral Daily   Chlorhexidine Gluconate Cloth  6 each Topical Daily   enoxaparin (LOVENOX) injection  40 mg Subcutaneous Q24H   erythromycin  500 mg Oral TID with meals   escitalopram  10 mg Oral Daily   insulin aspart  0-20 Units Subcutaneous TID WC   insulin aspart  0-5 Units Subcutaneous QHS   metoprolol succinate  100 mg Oral Daily    pantoprazole  40 mg Oral Daily   sodium chloride flush  3 mL Intravenous Q12H   ticagrelor  90 mg Oral BID   Continuous Infusions:  sodium chloride     PRN Meds: sodium chloride, acetaminophen, LORazepam, LORazepam, metoCLOPramide, morphine injection, ondansetron (ZOFRAN) IV, promethazine, sodium chloride flush   Vital Signs    Vitals:   07/24/23 0920 07/24/23 0940 07/24/23 1000 07/24/23 1030  BP: 122/86 121/76 115/69 102/72  Pulse: 70 72 80 77  Resp: 12 13 15 17   Temp:      TempSrc:      SpO2: 100% 98% 99% 99%  Weight:      Height:        Intake/Output Summary (Last 24 hours) at 07/24/2023 1305 Last data filed at 07/24/2023 0600 Gross per 24 hour  Intake 865.7 ml  Output 900 ml  Net -34.3 ml      07/23/2023    8:25 PM 07/23/2023    6:22 PM 07/20/2023    9:32 PM  Last 3 Weights  Weight (lbs) 238 lb 1.6 oz 238 lb 1.6 oz 240 lb  Weight (kg) 108 kg 108 kg 108.863 kg      Telemetry    Normal sinus rhythm- Personally Reviewed  ECG     - Personally Reviewed  Physical Exam   GEN: No acute distress.   Neck: No JVD Cardiac: RRR, no murmurs, rubs, or gallops.  Respiratory: Clear to auscultation bilaterally. GI: Soft, nontender, non-distended  MS: No edema; No deformity. Neuro:  Nonfocal  Psych: Normal affect   Labs  High Sensitivity Troponin:   Recent Labs  Lab 07/20/23 2136 07/21/23 0032 07/21/23 0709 07/23/23 2022  TROPONINIHS 14 22* 278* 811*     Chemistry Recent Labs  Lab 07/22/23 0341 07/23/23 2022 07/24/23 0230  NA 133* 132* 131*  K 3.7 5.0 3.9  CL 101 101 102  CO2 23 22 20*  GLUCOSE 232* 326* 409*  BUN 12 16 16   CREATININE 0.52 0.61  0.57 0.52  CALCIUM 8.3* 8.4* 8.0*  GFRNONAA >60 >60  >60 >60  ANIONGAP 9 9 9     Lipids  Recent Labs  Lab 07/20/23 2138  CHOL 255*  TRIG 174*  HDL 55  LDLCALC 165*  CHOLHDL 4.6    Hematology Recent Labs  Lab 07/22/23 0341 07/23/23 2022 07/24/23 0230  WBC 9.8 12.0* 10.4  RBC 4.62  5.14* 4.60  HGB 10.5* 11.8* 10.3*  HCT 34.1* 38.1 34.3*  MCV 73.8* 74.1* 74.6*  MCH 22.7* 23.0* 22.4*  MCHC 30.8 31.0 30.0  RDW 16.4* 16.8* 16.5*  PLT 308 394 356   Thyroid No results for input(s): "TSH", "FREET4" in the last 168 hours.  BNPNo results for input(s): "BNP", "PROBNP" in the last 168 hours.  DDimer No results for input(s): "DDIMER" in the last 168 hours.   Radiology    CARDIAC CATHETERIZATION  Result Date: 07/23/2023   Mid LAD lesion is 30% stenosed.   Prox RCA to Mid RCA lesion is 30% stenosed.   1st Diag lesion is 100% stenosed.   2nd Diag lesion is 100% stenosed.   RPDA lesion is 50% stenosed.   Dist Cx lesion is 50% stenosed.   Non-stenotic Ost LAD to Mid LAD lesion was previously treated.   Non-stenotic 3rd Mrg lesion was previously treated.   Balloon angioplasty was performed using a BALLN [place] TREK NEO RX 3.0X12.   Post intervention, there is a 0% residual stenosis.   There is mild left ventricular systolic dysfunction.   LV end diastolic pressure is mildly elevated.   The left ventricular ejection fraction is 45-50% by visual estimate. 1.  Subacute stent thrombosis in the distal left circumflex with patent stent in the OM branch.  IVUS showed evidence of stent mall apposition proximally.  No evidence of edge dissection.  Patent stents in the LAD and RCA. 2.  Mildly reduced LV systolic function with mildly elevated left ventricular end-diastolic pressure. 3.  Successful balloon angioplasty to the proximal segment of the left circumflex stent using a 3.0 x 12 mm noncompliant balloon to 16 atm. Recommendations: Continue dual antiplatelet therapy indefinitely. Aggressive treatment of risk factors and consider a PCSK9 inhibitor given elevated LDL and LP(a).    Cardiac Studies     Patient Profile     Karen Ruiz is a 42 y.o. female with a hx of coronary artery disease who is being seen 07/23/2023 for the evaluation of inferolateral ST elevation myocardial infarction    Assessment & Plan    Inferolateral STEMI Subacute stent thrombosis noted on catheterization last night, angioplasty performed mal-apposition proximally with no evidence of edge dissection.  This was treated with a noncompliant balloon to high pressure with resolution of thrombus  -Stable this morning, no anginal symptoms -Continued on aspirin and Brilinta 90 mg twice daily, redosing required this morning after she vomited a.m. medications.  Requiring multiple injections of Dilaudid for abdominal pain which she attributes to gastroparesis -On metoprolol, Lipitor 80, aspirin Brilinta  Gastroparesis, chronic abdominal pain Managed at [facility] Numerous visits to the emergency room  particularly through 2023, most recently September 2024 Recently seen in the emergency room [facility] for similar symptoms in September 2024 -Does not appear that she has had any recent follow-up with GI since June 2020 (telemedicine visit).  Prior EGD 2020 -Would recommend she reestablish with GI  Long history of diabetes with complications -Managed at [facility]  Polysubstance abuse Cessation recommended  Personality disorder Numerous documentations of yelling at hospital staff including April 2023,  May 2024 requiring security be called -Issues this admission, declining transfer out of the ICU despite request to do so  For questions or updates, please contact [facility] Please consult [email or web address] for contact info under        Signed, Julien Nordmann, MD  07/24/2023, 1:05 PM

## 2023-07-24 NOTE — Progress Notes (Signed)
Pt d/c per order, pt refused to wait until recommended time of 1800, refused vs check and troponin labs, MD notified of this refusal, pt educated on importance of these labs but refused. Pt given meds as charted. PIV d/c, in stable condition.

## 2023-07-24 NOTE — TOC Benefit Eligibility Note (Signed)
Patient Product/process development scientist completed.    The patient is insured through [facility] MEDICAID.     Ran test claim for Repatha 140 mg/ml and Requires Prior Authorization  Ran test claim for Praluent 150 mg/ml and Requires Prior Authorization  This test claim was processed through [facility]- copay amounts may vary at other pharmacies due to [employer], or as the patient moves through the different stages of their insurance plan.     Roland Earl, CPHT Pharmacy Technician III Certified Patient Advocate [facility] Patient Advocate Team Direct Number: [phone]  Fax: [phone]

## 2023-07-24 NOTE — Discharge Summary (Signed)
Karen Ruiz MVH:[id number] DOB: Oct 09, 1980 DOA: 07/23/2023  PCP: Dan Humphreys, MD  Admit date: 07/23/2023 Discharge date: 07/24/2023  Time spent: 35 minutes  Recommendations for Outpatient Follow-up:  Cardiology, pcp, and GI f/u     Discharge Diagnoses:  Principal Problem:   Acute ST elevation myocardial infarction (STEMI) of inferolateral wall (HCC) Active Problems:   Diabetes mellitus type 2 with complications (HCC)   Coronary stent thrombosis, initial encounter   Diabetic gastroparesis (HCC)   Borderline personality disorder (HCC)   Discharge Condition: stable  Diet recommendation: heart healthy  Filed Weights   07/23/23 1822 07/23/23 2025  Weight: 108 kg 108 kg    History of present illness:  From admission h and p Karen Ruiz is a 42 y.o. Caucasian female with medical history significant for type II diabetes mellitus, with diabetic gastroparesis, history of cocaine abuse, Barrett's esophagus, migraine as well as coronary artery disease s/p MI in 2022 s/p PCI and DES in the RCA and another stent in the proximal/mid LAD with [facility] cardiolog, who just had a PCI and stent of her LCA and OM branch on 10/20-24 and was discharged yesterday.  She presented to the emergency room with acute onset of recurrent midsternal chest pain with associated nausea and diaphoresis and radiation to her left arm.  Pain started around 5 PM today.  No fever or chills.  No cough or wheezing or hemoptysis.  She stated that she gets significant pain with her gastroparesis that she vomits and is concerned about not keeping her antiplatelet therapy.  She was discharged on aspirin and Brilinta.  No dysuria, liquidy or hematuria or flank pain.  No other bleeding diathesis.   Hospital Course:  Patient admitted last week with inferior STEMI treated with PCI stent. Discharged 10/22 and readmitted later that evening with chest pain, taken to cath lab where nonocclusive thrombus in the distal  left circumflex stent was noted. IVUS performed showing stent pal-apposition treated with noncompliant balloon to high pressure with resolution of the thrombus. Patient chest-pain free following the procedure and was monitored for 24 hours. Ambulated satisfactorily with PT. Patient noted to have poorly controlled diabetes and she complains of chronic symptomatic gastroparesis. Discussed with GI (Dr. Tobi Bastos) who says nothing to do for this acutely, needs better glycemic control and outpatient GI f/u. Patient says she follows regularly with [facility] though care everywhere shows no visits since 2020. Advising outpatient pcp, GI, and cardiology f/u, continue current meds (aspirin, brilinta, metoprolol, lipitor).   Procedures: See above   Consultations: cardiology  Discharge Exam: Vitals:   07/24/23 1000 07/24/23 1030  BP: 115/69 102/72  Pulse: 80 77  Resp: 15 17  Temp:    SpO2: 99% 99%    General: NAD Cardiovascular: RRR, soft systolic murmur, no edema Respiratory: CTAB  Discharge Instructions   Discharge Instructions     AMB Referral to Cardiac Rehabilitation - Phase II   Complete by: As directed    Diagnosis: STEMI   After initial evaluation and assessments completed: Virtual Based Care may be provided alone or in conjunction with Phase 2 Cardiac Rehab based on patient barriers.: Yes   Intensive Cardiac Rehabilitation (ICR) [facility] location only OR Traditional Cardiac Rehabilitation (TCR) *If criteria for ICR are not met will enroll in TCR [facility] only): Yes   Diet - low sodium heart healthy   Complete by: As directed    Increase activity slowly   Complete by: As directed  Allergies as of 07/24/2023       Reactions   Quinolones Other (See Comments)        Medication List     TAKE these medications    aspirin EC 81 MG tablet Take 1 tablet (81 mg total) by mouth daily. Swallow whole.   atorvastatin 80 MG tablet Commonly known as: LIPITOR Take 1 tablet (80 mg total) by  mouth daily.   escitalopram 10 MG tablet Commonly known as: LEXAPRO Take 1 tablet (10 mg total) by mouth daily.   insulin aspart 100 UNIT/ML injection Commonly known as: novoLOG Inject 0-9 Units into the skin 3 (three) times daily with meals.   metoprolol succinate 100 MG 24 hr tablet Commonly known as: TOPROL-XL Take 1 tablet (100 mg total) by mouth daily. Take with or immediately following a meal. PLEASE SCHEDULE OFFICE VISIT FOR FURTHER REFILLS. THANK YOU!   pantoprazole 40 MG tablet Commonly known as: PROTONIX Take 1 tablet (40 mg total) by mouth daily. PLEASE SCHEDULE OFFICE VISIT FOR FURTHER REFILLS. THANK YOU!   promethazine 25 MG suppository Commonly known as: PHENERGAN Place 1 suppository (25 mg total) rectally every 8 (eight) hours as needed for refractory nausea / vomiting.   ticagrelor 90 MG Tabs tablet Commonly known as: BRILINTA Take 1 tablet (90 mg total) by mouth 2 (two) times daily.       Allergies  Allergen Reactions   Quinolones Other (See Comments)    Follow-up Information     Dan Humphreys, MD Follow up.   Specialty: Family Medicine Contact information: [address] [place] [address] [phone]         Iran Ouch, MD Follow up.   Specialty: Cardiology Contact information: [address] [place] [address] [phone]         Your [facility] gastroenterologist Follow up.                   The results of significant diagnostics from this hospitalization (including imaging, microbiology, ancillary and laboratory) are listed below for reference.    Significant Diagnostic Studies: CARDIAC CATHETERIZATION  Result Date: 07/23/2023   Mid LAD lesion is 30% stenosed.   Prox RCA to Mid RCA lesion is 30% stenosed.   1st Diag lesion is 100% stenosed.   2nd Diag lesion is 100% stenosed.   RPDA lesion is 50% stenosed.   Dist Cx lesion is 50% stenosed.   Non-stenotic Ost LAD to Mid LAD lesion was previously  treated.   Non-stenotic 3rd Mrg lesion was previously treated.   Balloon angioplasty was performed using a BALLN [place] TREK NEO RX 3.0X12.   Post intervention, there is a 0% residual stenosis.   There is mild left ventricular systolic dysfunction.   LV end diastolic pressure is mildly elevated.   The left ventricular ejection fraction is 45-50% by visual estimate. 1.  Subacute stent thrombosis in the distal left circumflex with patent stent in the OM branch.  IVUS showed evidence of stent mall apposition proximally.  No evidence of edge dissection.  Patent stents in the LAD and RCA. 2.  Mildly reduced LV systolic function with mildly elevated left ventricular end-diastolic pressure. 3.  Successful balloon angioplasty to the proximal segment of the left circumflex stent using a 3.0 x 12 mm noncompliant balloon to 16 atm. Recommendations: Continue dual antiplatelet therapy indefinitely. Aggressive treatment of risk factors and consider a PCSK9 inhibitor given elevated LDL and LP(a).   ECHOCARDIOGRAM COMPLETE  Result Date: 07/21/2023    ECHOCARDIOGRAM REPORT   Patient Name:   JAYLANIS ROCCAFORTE Date of Exam: 07/21/2023 Medical Rec #:  [id number]            Height:       66.0 in Accession #:    [id number]           Weight:       240.0 lb Date of Birth:  16-Nov-1980            BSA:          2.161 m Patient Age:    42 years             BP:           149/110 mmHg Patient Gender: F                    HR:           79 bpm. Exam Location:  [facility] Procedure: 2D Echo, Cardiac Doppler, Color Doppler and Strain Analysis Indications:     Acute myocardial infarction, unspecified I21.9  History:         Patient has prior history of Echocardiogram examinations. Risk                  Factors:Diabetes.  Sonographer:     Neysa Bonito Roar Referring Phys:  [id number] A ARIDA Diagnosing Phys: Debbe Odea MD  Sonographer Comments: Global longitudinal strain was attempted. IMPRESSIONS  1. Left ventricular ejection fraction, by  estimation, is 55 to 60%. The left ventricle has normal function. The left ventricle has no regional wall motion abnormalities. Left ventricular diastolic parameters were normal. The average left ventricular global longitudinal strain is -16.9 %. The global longitudinal strain is normal.  2. Right ventricular systolic function is normal. The right ventricular size is normal.  3. The mitral valve is normal in structure. No evidence of mitral valve regurgitation.  4. The aortic valve was not well visualized. Aortic valve regurgitation is not visualized.  5. The inferior vena cava is normal in size with greater than 50% respiratory variability, suggesting right atrial pressure of 3 mmHg. FINDINGS  Left Ventricle: Left ventricular ejection fraction, by estimation, is 55 to 60%. The left ventricle has normal function. The left ventricle has no regional wall motion abnormalities. The average left ventricular global longitudinal strain is -16.9 %. The global longitudinal strain is normal. The left ventricular internal cavity size was normal in size. There is no left ventricular hypertrophy. Left ventricular diastolic parameters were normal. Right Ventricle: The right ventricular size is normal. No increase in right ventricular wall thickness. Right ventricular systolic function is normal. Left Atrium: Left atrial size was normal in size. Right Atrium: Right atrial size was normal in size. Pericardium: There is no evidence of pericardial effusion. Mitral Valve: The mitral valve is normal in structure. No evidence of mitral valve regurgitation. MV peak gradient, 2.8 mmHg. The mean mitral valve gradient is 1.0 mmHg. Tricuspid Valve: The tricuspid valve is normal in structure. Tricuspid valve regurgitation is not demonstrated. Aortic Valve: The aortic valve was not well visualized. Aortic valve regurgitation is not visualized. Aortic valve mean gradient measures 5.0 mmHg. Aortic valve peak gradient measures 8.2 mmHg. Aortic  valve area, by VTI measures 2.98 cm. Pulmonic Valve: The pulmonic valve was not well visualized. Pulmonic valve regurgitation is not visualized. Aorta: The aortic root was not well visualized. Venous: The inferior vena cava is normal in size with  greater than 50% respiratory variability, suggesting right atrial pressure of 3 mmHg. IAS/Shunts: No atrial level shunt detected by color flow Doppler.  LEFT VENTRICLE PLAX 2D LVIDd:         5.00 cm   Diastology LVIDs:         3.60 cm   LV e' medial:    8.81 cm/s LV PW:         1.00 cm   LV E/e' medial:  8.7 LV IVS:        1.00 cm   LV e' lateral:   12.20 cm/s LVOT diam:     2.00 cm   LV E/e' lateral: 6.3 LV SV:         81 LV SV Index:   38        2D Longitudinal Strain LVOT Area:     3.14 cm  2D Strain GLS Avg:     -16.9 %  RIGHT VENTRICLE RV Basal diam:  2.80 cm RV Mid diam:    3.00 cm RV S prime:     10.80 cm/s TAPSE (M-mode): 2.1 cm LEFT ATRIUM             Index        RIGHT ATRIUM           Index LA diam:        3.70 cm 1.71 cm/m   RA Area:     12.70 cm LA Vol (A2C):   67.9 ml 31.42 ml/m  RA Volume:   27.80 ml  12.86 ml/m LA Vol (A4C):   52.2 ml 24.16 ml/m LA Biplane Vol: 60.0 ml 27.77 ml/m  AORTIC VALVE                     PULMONIC VALVE AV Area (Vmax):    2.83 cm      PV Vmax:        0.96 m/s AV Area (Vmean):   2.47 cm      PV Peak grad:   3.7 mmHg AV Area (VTI):     2.98 cm      RVOT Peak grad: 3 mmHg AV Vmax:           143.00 cm/s AV Vmean:          104.000 cm/s AV VTI:            0.272 m AV Peak Grad:      8.2 mmHg AV Mean Grad:      5.0 mmHg LVOT Vmax:         129.00 cm/s LVOT Vmean:        81.800 cm/s LVOT VTI:          0.258 m LVOT/AV VTI ratio: 0.95 MITRAL VALVE MV Area (PHT): 4.39 cm    SHUNTS MV Area VTI:   3.27 cm    Systemic VTI:  0.26 m MV Peak grad:  2.8 mmHg    Systemic Diam: 2.00 cm MV Mean grad:  1.0 mmHg MV Vmax:       0.84 m/s MV Vmean:      57.0 cm/s MV Decel Time: 173 msec MV E velocity: 77.00 cm/s MV A velocity: 59.70 cm/s MV E/A  ratio:  1.29 MV A Prime:    10.9 cm/s Debbe Odea MD Electronically signed by Debbe Odea MD Signature Date/Time: 07/21/2023/3:01:09 PM    Final    CARDIAC CATHETERIZATION  Result Date: 07/20/2023   Prox RCA to Mid RCA lesion  is 30% stenosed.   RPDA lesion is 50% stenosed.   Dist Cx lesion is 90% stenosed.   3rd Mrg lesion is 70% stenosed.   Mid LAD lesion is 30% stenosed.   1st Diag lesion is 100% stenosed.   2nd Diag lesion is 100% stenosed.   Previously placed Ost LAD to Mid LAD stent of unknown type is  widely patent.   A drug-eluting stent was successfully placed using a STENT ONYX FRONTIER 2.5X18.   A drug-eluting stent was successfully placed using a STENT ONYX FRONTIER 2.0X15.   Post intervention, there is a 0% residual stenosis.   Post intervention, there is a 0% residual stenosis.   There is mild to moderate left ventricular systolic dysfunction.   LV end diastolic pressure is moderately elevated.   The left ventricular ejection fraction is 35-45% by visual estimate. 1.  Patent LAD and RCA stents with mild in-stent restenosis.  Chronically occluded first and second diagonal.  80 to 90% hazy stenosis in the distal left circumflex at the bifurcation of an OM branch seems to be the culprit for inferior/inferolateral ST elevation myocardial infarction. 2.  Mildly to moderately reduced LV systolic function with moderately elevated left ventricular end-diastolic pressure. 3.  Successful complex bifurcation angioplasty utilizing the reverse mini crush technique to the distal left circumflex/OM bifurcation.  Initially, a stent was placed in the distal left circumflex but there was loss of flow in the OM branch and thus I elected to treat given that the patient was highly symptomatic. Recommendations: Dual antiplatelet therapy for at least 12 months and preferably indefinitely. Aggrastat infusion for 12 hours given risk of stent thrombosis in the setting of vomiting. Nitroglycerin drip for pain  control. Check urine drug screen. Aggressive treatment of risk factors and smoking cessation.    Microbiology: Recent Results (from the past 240 hour(s))  MRSA Next Gen by PCR, Nasal     Status: None   Collection Time: 07/21/23 12:00 AM   Specimen: Nasal Mucosa; Nasal Swab  Result Value Ref Range Status   MRSA by PCR Next Gen NOT DETECTED NOT DETECTED Final    Comment: (NOTE) The GeneXpert MRSA Assay (FDA approved for NASAL specimens only), is one component of a comprehensive MRSA colonization surveillance program. It is not intended to diagnose MRSA infection nor to guide or monitor treatment for MRSA infections. Test performance is not FDA approved in patients less than 70 years old. Performed at [facility], [address] [place], [place] [address]      Labs: Basic Metabolic Panel: Recent Labs  Lab 07/20/23 2136 07/21/23 0709 07/22/23 0341 07/23/23 2022 07/24/23 0230  NA 133* 134* 133* 132* 131*  K 4.7 3.7 3.7 5.0 3.9  CL 100 102 101 101 102  CO2 22 22 23 22  20*  GLUCOSE 330* 184* 232* 326* 409*  BUN 12 12 12 16 16   CREATININE 0.77 0.46 0.52 0.61  0.57 0.52  CALCIUM 8.9 8.4* 8.3* 8.4* 8.0*   Liver Function Tests: No results for input(s): "AST", "ALT", "ALKPHOS", "BILITOT", "PROT", "ALBUMIN" in the last 168 hours. No results for input(s): "LIPASE", "AMYLASE" in the last 168 hours. No results for input(s): "AMMONIA" in the last 168 hours. CBC: Recent Labs  Lab 07/20/23 2136 07/21/23 0709 07/22/23 0341 07/23/23 2022 07/24/23 0230  WBC 14.2* 12.1* 9.8 12.0* 10.4  NEUTROABS  --   --   --  9.1*  --   HGB 12.3 10.7* 10.5* 11.8* 10.3*  HCT 40.9 33.6*  34.1* 38.1 34.3*  MCV 74.2* 72.4* 73.8* 74.1* 74.6*  PLT 479* 395 308 394 356   Cardiac Enzymes: No results for input(s): "CKTOTAL", "CKMB", "CKMBINDEX", "TROPONINI" in the last 168 hours. BNP: BNP (last 3 results) Recent Labs    02/19/23 1450  BNP 13.5    ProBNP (last 3 results) No results for  input(s): "PROBNP" in the last 8760 hours.  CBG: Recent Labs  Lab 07/21/23 2147 07/22/23 0737 07/23/23 2017 07/24/23 0830 07/24/23 1128  GLUCAP 314* 250* 297* 232* 222*       Signed:  Silvano Bilis MD.  [facility] 07/24/2023, 4:25 PM

## 2023-07-24 NOTE — Progress Notes (Signed)
Received bed assignment for patient to transferred to progressive care unit, room 240. However patient refuses to be transferred out of ICU, stating she is "uncomfortable, due to having 2 heart attacks in past 2 days". Charge RN in to speak with patient. Dr. Lewie Loron, Dr. Kirke Corin, Dr. Ashok Pall, and nursing supervisor made aware. Awaiting further information, and decision from medical providers.

## 2023-07-29 ENCOUNTER — Inpatient Hospital Stay
Admission: EM | Admit: 2023-07-29 | Discharge: 2023-07-31 | DRG: 303 | Disposition: A | Discharge status: Home / Self Care | Payer: Medicaid Other | Attending: Internal Medicine | Admitting: Internal Medicine

## 2023-07-29 ENCOUNTER — Emergency Department: Payer: Medicaid Other

## 2023-07-29 ENCOUNTER — Encounter: Payer: Self-pay | Admitting: Internal Medicine

## 2023-07-29 DIAGNOSIS — I25118 Atherosclerotic heart disease of native coronary artery with other forms of angina pectoris: Principal | ICD-10-CM | POA: Diagnosis present

## 2023-07-29 DIAGNOSIS — E66811 Obesity, class 1: Secondary | ICD-10-CM | POA: Diagnosis present

## 2023-07-29 DIAGNOSIS — I110 Hypertensive heart disease with heart failure: Secondary | ICD-10-CM | POA: Diagnosis present

## 2023-07-29 DIAGNOSIS — Z91199 Patient's noncompliance with other medical treatment and regimen due to unspecified reason: Secondary | ICD-10-CM | POA: Diagnosis not present

## 2023-07-29 DIAGNOSIS — Z79899 Other long term (current) drug therapy: Secondary | ICD-10-CM

## 2023-07-29 DIAGNOSIS — R7989 Other specified abnormal findings of blood chemistry: Secondary | ICD-10-CM | POA: Diagnosis present

## 2023-07-29 DIAGNOSIS — Z794 Long term (current) use of insulin: Secondary | ICD-10-CM | POA: Diagnosis not present

## 2023-07-29 DIAGNOSIS — I2510 Atherosclerotic heart disease of native coronary artery without angina pectoris: Secondary | ICD-10-CM | POA: Diagnosis present

## 2023-07-29 DIAGNOSIS — I10 Essential (primary) hypertension: Secondary | ICD-10-CM | POA: Diagnosis present

## 2023-07-29 DIAGNOSIS — I2582 Chronic total occlusion of coronary artery: Secondary | ICD-10-CM | POA: Diagnosis present

## 2023-07-29 DIAGNOSIS — Y831 Surgical operation with implant of artificial internal device as the cause of abnormal reaction of the patient, or of later complication, without mention of misadventure at the time of the procedure: Secondary | ICD-10-CM | POA: Diagnosis present

## 2023-07-29 DIAGNOSIS — Z833 Family history of diabetes mellitus: Secondary | ICD-10-CM

## 2023-07-29 DIAGNOSIS — E7841 Elevated Lipoprotein(a): Secondary | ICD-10-CM | POA: Diagnosis present

## 2023-07-29 DIAGNOSIS — Z8614 Personal history of Methicillin resistant Staphylococcus aureus infection: Secondary | ICD-10-CM

## 2023-07-29 DIAGNOSIS — G43909 Migraine, unspecified, not intractable, without status migrainosus: Secondary | ICD-10-CM | POA: Diagnosis present

## 2023-07-29 DIAGNOSIS — I5022 Chronic systolic (congestive) heart failure: Secondary | ICD-10-CM | POA: Diagnosis present

## 2023-07-29 DIAGNOSIS — Z888 Allergy status to other drugs, medicaments and biological substances status: Secondary | ICD-10-CM

## 2023-07-29 DIAGNOSIS — K3184 Gastroparesis: Secondary | ICD-10-CM | POA: Diagnosis present

## 2023-07-29 DIAGNOSIS — E782 Mixed hyperlipidemia: Secondary | ICD-10-CM | POA: Diagnosis present

## 2023-07-29 DIAGNOSIS — E1143 Type 2 diabetes mellitus with diabetic autonomic (poly)neuropathy: Secondary | ICD-10-CM | POA: Diagnosis present

## 2023-07-29 DIAGNOSIS — E669 Obesity, unspecified: Secondary | ICD-10-CM | POA: Diagnosis present

## 2023-07-29 DIAGNOSIS — Z720 Tobacco use: Secondary | ICD-10-CM | POA: Diagnosis not present

## 2023-07-29 DIAGNOSIS — F1410 Cocaine abuse, uncomplicated: Secondary | ICD-10-CM | POA: Diagnosis present

## 2023-07-29 DIAGNOSIS — I214 Non-ST elevation (NSTEMI) myocardial infarction: Principal | ICD-10-CM | POA: Diagnosis present

## 2023-07-29 DIAGNOSIS — Z7982 Long term (current) use of aspirin: Secondary | ICD-10-CM | POA: Diagnosis not present

## 2023-07-29 DIAGNOSIS — F17210 Nicotine dependence, cigarettes, uncomplicated: Secondary | ICD-10-CM | POA: Diagnosis present

## 2023-07-29 DIAGNOSIS — F1510 Other stimulant abuse, uncomplicated: Secondary | ICD-10-CM | POA: Diagnosis present

## 2023-07-29 DIAGNOSIS — K2270 Barrett's esophagus without dysplasia: Secondary | ICD-10-CM | POA: Diagnosis present

## 2023-07-29 DIAGNOSIS — I252 Old myocardial infarction: Secondary | ICD-10-CM

## 2023-07-29 DIAGNOSIS — Z7902 Long term (current) use of antithrombotics/antiplatelets: Secondary | ICD-10-CM

## 2023-07-29 DIAGNOSIS — Z6833 Body mass index (BMI) 33.0-33.9, adult: Secondary | ICD-10-CM

## 2023-07-29 DIAGNOSIS — Z8249 Family history of ischemic heart disease and other diseases of the circulatory system: Secondary | ICD-10-CM | POA: Diagnosis not present

## 2023-07-29 DIAGNOSIS — E1165 Type 2 diabetes mellitus with hyperglycemia: Secondary | ICD-10-CM | POA: Diagnosis present

## 2023-07-29 DIAGNOSIS — T82867A Thrombosis of cardiac prosthetic devices, implants and grafts, initial encounter: Secondary | ICD-10-CM | POA: Diagnosis present

## 2023-07-29 LAB — HEPATIC FUNCTION PANEL
ALT: 20 U/L (ref 0–44)
AST: 19 U/L (ref 15–41)
Albumin: 3.5 g/dL (ref 3.5–5.0)
Alkaline Phosphatase: 85 U/L (ref 38–126)
Bilirubin, Direct: 0.1 mg/dL (ref 0.0–0.2)
Indirect Bilirubin: 0.3 mg/dL (ref 0.3–0.9)
Total Bilirubin: 0.4 mg/dL (ref 0.3–1.2)
Total Protein: 7.3 g/dL (ref 6.5–8.1)

## 2023-07-29 LAB — CBC WITH DIFFERENTIAL/PLATELET
Abs Immature Granulocytes: 0.07 10*3/uL (ref 0.00–0.07)
Basophils Absolute: 0.1 10*3/uL (ref 0.0–0.1)
Basophils Relative: 1 %
Eosinophils Absolute: 0.4 10*3/uL (ref 0.0–0.5)
Eosinophils Relative: 3 %
HCT: 36.6 % (ref 36.0–46.0)
Hemoglobin: 11.4 g/dL — ABNORMAL LOW (ref 12.0–15.0)
Immature Granulocytes: 1 %
Lymphocytes Relative: 26 %
Lymphs Abs: 3.3 10*3/uL (ref 0.7–4.0)
MCH: 22.6 pg — ABNORMAL LOW (ref 26.0–34.0)
MCHC: 31.1 g/dL (ref 30.0–36.0)
MCV: 72.6 fL — ABNORMAL LOW (ref 80.0–100.0)
Monocytes Absolute: 1 10*3/uL (ref 0.1–1.0)
Monocytes Relative: 8 %
Neutro Abs: 7.8 10*3/uL — ABNORMAL HIGH (ref 1.7–7.7)
Neutrophils Relative %: 61 %
Platelets: 431 10*3/uL — ABNORMAL HIGH (ref 150–400)
RBC: 5.04 MIL/uL (ref 3.87–5.11)
RDW: 17.2 % — ABNORMAL HIGH (ref 11.5–15.5)
WBC: 12.6 10*3/uL — ABNORMAL HIGH (ref 4.0–10.5)
nRBC: 0 % (ref 0.0–0.2)

## 2023-07-29 LAB — BASIC METABOLIC PANEL
Anion gap: 10 (ref 5–15)
BUN: 11 mg/dL (ref 6–20)
CO2: 23 mmol/L (ref 22–32)
Calcium: 8.7 mg/dL — ABNORMAL LOW (ref 8.9–10.3)
Chloride: 97 mmol/L — ABNORMAL LOW (ref 98–111)
Creatinine, Ser: 0.57 mg/dL (ref 0.44–1.00)
GFR, Estimated: >60 mL/min (ref >60)
Glucose, Bld: 339 mg/dL — ABNORMAL HIGH (ref 70–99)
Potassium: 4 mmol/L (ref 3.5–5.1)
Sodium: 130 mmol/L — ABNORMAL LOW (ref 135–145)

## 2023-07-29 LAB — HCG, QUANTITATIVE, PREGNANCY: hCG, Beta Chain, Quant, S: 1 m[IU]/mL (ref <5)

## 2023-07-29 LAB — TROPONIN I (HIGH SENSITIVITY)
Troponin I (High Sensitivity): 817 ng/L (ref <18)
Troponin I (High Sensitivity): 789 ng/L (ref <18)

## 2023-07-29 LAB — APTT: aPTT: 31 s (ref 24–36)

## 2023-07-29 LAB — PROTIME-INR
INR: 1 (ref 0.8–1.2)
Prothrombin Time: 13.4 s (ref 11.4–15.2)

## 2023-07-29 LAB — T4, FREE: Free T4: 0.91 ng/dL (ref 0.61–1.12)

## 2023-07-29 LAB — TSH: TSH: 4.787 u[IU]/mL — ABNORMAL HIGH (ref 0.350–4.500)

## 2023-07-29 MED ORDER — NICOTINE 21 MG/24HR TD PT24
21.0000 mg | MEDICATED_PATCH | Freq: Every day | TRANSDERMAL | Status: DC
  Filled 2023-07-29 (×3): qty 1

## 2023-07-29 MED ORDER — HYDROCODONE-ACETAMINOPHEN 5-325 MG PO TABS
1.0000 | ORAL_TABLET | ORAL | Status: DC | PRN
  Administered 2023-07-30 – 2023-07-31 (×3): 1 via ORAL
  Filled 2023-07-29 (×3): qty 1

## 2023-07-29 MED ORDER — ACETAMINOPHEN 325 MG PO TABS: 650.0000 mg | ORAL_TABLET | Freq: Four times a day (QID) | ORAL | Status: DC | PRN

## 2023-07-29 MED ORDER — ACETAMINOPHEN 650 MG RE SUPP: 650.0000 mg | Freq: Four times a day (QID) | RECTAL | Status: DC | PRN

## 2023-07-29 MED ORDER — NITROGLYCERIN IN D5W 200-5 MCG/ML-% IV SOLN
0.0000 ug/min | INTRAVENOUS | Status: DC
  Administered 2023-07-29: 5 ug/min via INTRAVENOUS
  Filled 2023-07-29: qty 250

## 2023-07-29 MED ORDER — IOHEXOL 350 MG/ML SOLN
100.0000 mL | Freq: Once | INTRAVENOUS | Status: AC | PRN
  Administered 2023-07-29: 100 mL via INTRAVENOUS

## 2023-07-29 MED ORDER — HEPARIN BOLUS VIA INFUSION
4000.0000 [IU] | Freq: Once | INTRAVENOUS | Status: AC
  Administered 2023-07-29: 4000 [IU] via INTRAVENOUS
  Filled 2023-07-29: qty 4000

## 2023-07-29 MED ORDER — ONDANSETRON HCL 4 MG/2ML IJ SOLN
4.0000 mg | Freq: Once | INTRAMUSCULAR | Status: AC
  Administered 2023-07-29: 4 mg via INTRAVENOUS
  Filled 2023-07-29: qty 2

## 2023-07-29 MED ORDER — LACTATED RINGERS IV SOLN: INTRAVENOUS | Status: AC

## 2023-07-29 MED ORDER — SODIUM CHLORIDE 0.9% FLUSH
3.0000 mL | Freq: Two times a day (BID) | INTRAVENOUS | Status: DC
Start: 2023-07-29 — End: 2023-08-01
  Administered 2023-07-29 – 2023-07-31 (×3): 3 mL via INTRAVENOUS

## 2023-07-29 MED ORDER — FENTANYL CITRATE PF 50 MCG/ML IJ SOSY
50.0000 ug | PREFILLED_SYRINGE | Freq: Once | INTRAMUSCULAR | Status: AC
  Administered 2023-07-29: 50 ug via INTRAVENOUS
  Filled 2023-07-29: qty 1

## 2023-07-29 MED ORDER — INSULIN ASPART 100 UNIT/ML IJ SOLN
0.0000 [IU] | INTRAMUSCULAR | Status: DC | PRN
  Administered 2023-07-30: 3 [IU] via SUBCUTANEOUS
  Filled 2023-07-29: qty 1

## 2023-07-29 MED ORDER — NITROGLYCERIN 0.4 MG SL SUBL
0.4000 mg | SUBLINGUAL_TABLET | Freq: Once | SUBLINGUAL | Status: AC
  Administered 2023-07-29: 0.4 mg via SUBLINGUAL
  Filled 2023-07-29: qty 1

## 2023-07-29 MED ORDER — ASPIRIN 81 MG PO TBEC
81.0000 mg | DELAYED_RELEASE_TABLET | Freq: Every day | ORAL | Status: DC
  Administered 2023-07-29 – 2023-07-31 (×3): 81 mg via ORAL
  Filled 2023-07-29 (×3): qty 1

## 2023-07-29 MED ORDER — METOCLOPRAMIDE HCL 5 MG/ML IJ SOLN
10.0000 mg | Freq: Once | INTRAMUSCULAR | Status: AC
  Administered 2023-07-29: 10 mg via INTRAVENOUS
  Filled 2023-07-29: qty 2

## 2023-07-29 MED ORDER — FENTANYL CITRATE PF 50 MCG/ML IJ SOSY
50.0000 ug | PREFILLED_SYRINGE | Freq: Once | INTRAMUSCULAR | Status: AC | PRN
  Administered 2023-07-29: 50 ug via INTRAVENOUS
  Filled 2023-07-29: qty 1

## 2023-07-29 MED ORDER — FENTANYL CITRATE PF 50 MCG/ML IJ SOSY
25.0000 ug | PREFILLED_SYRINGE | INTRAMUSCULAR | Status: AC | PRN
  Administered 2023-07-30: 25 ug via INTRAVENOUS
  Filled 2023-07-29: qty 1

## 2023-07-29 MED ORDER — HEPARIN (PORCINE) 25000 UT/250ML-% IV SOLN
1600.0000 [IU]/h | INTRAVENOUS | Status: AC
  Administered 2023-07-29: 1250 [IU]/h via INTRAVENOUS
  Administered 2023-07-30: 1450 [IU]/h via INTRAVENOUS
  Administered 2023-07-31: 1600 [IU]/h via INTRAVENOUS
  Filled 2023-07-29 (×3): qty 250

## 2023-07-29 NOTE — H&P (Signed)
History and Physical    Patient: Karen Ruiz AOZ:[id number] DOB: [birth year] DOA: 07/29/2023 DOS: the patient was seen and examined on 07/30/2023 PCP: Dan Humphreys, MD  Patient coming from: Home  Chief Complaint:  Chief Complaint  Patient presents with   Chest Pain    HPI: Karen Ruiz is a 42 y.o. female with medical history significant for diabetes mellitus II poorly controlled A1c of 11 in October 2024, and diabetic gastroparesis, history of cocaine abuse, migraine, coronary artery disease s/p MI in 2022 s/p PCI and DES in the RCA and another stent in the proximal/mid LAD with [facility] cardiology was admitted on July 22, 2023 with complaints of chest pain and found to have STEMI in the inferior lateral leads with a code STEMI activated and patient emergently taken to Cath Lab on 07/20/2023 patient had left heart cath showing: 1.  Patent LAD and RCA stents with mild in-stent restenosis.  Chronically occluded first and second diagonal.  80 to 90% hazy stenosis in the distal left circumflex at the bifurcation of an OM branch seems to be the culprit for inferior/inferolateral ST elevation myocardial infarction. 2.  Mildly to moderately reduced LV systolic function with moderately elevated left ventricular end-diastolic pressure. 3.  Successful complex bifurcation angioplasty utilizing the reverse mini crush technique to the distal left circumflex/OM bifurcation.  Initially, a stent was placed in the distal left circumflex but there was loss of flow in the OM branch and thus I elected to treat given that the patient was highly symptomatic. Patient was discharged on the 19th with continuation of aspirin and Brilinta for 12 months.  Patient was also found to have reduced ejection fraction heart failure with grade 1 diastolic dysfunction attributed to her recent STEMI with plans to continue beta-blocker and ARB therapy.  Education and guidance on lifestyle modification and tobacco  cessation. Patient returned to the emergency room on 23 July 2023 again with midsternal chest pain associated with nausea radiation to the left arm patient was found to have an NSTEMI in the anterior leads patient again was taken emergently to the Cath Lab because of the high risk of stent thrombosis restenosis and history of recent STEMI patient wasfound to have subacute stent thrombosis in the distal left circumflex with patent stent in the OM branch. Stents in the LAD and RCA were patent. It showed mildly reduced left ventricular systolic function with mildly elevated left ventricular end-diastolic pressure. IVUS was performed which showed stent mal-apposition proximally with no evidence of edge dissection. This was treated with a noncompliant balloon to high pressure with resolution of thrombus and excellent angiographic appearance.  Patient was discharged with plan of continuing patient on Brilinta high-dose statin therapy beta-blocker therapy along with aspirin.  Sublingual nitroglycerin and cardiology follow-up. In emergency room vitals trend shows: Vitals:   07/29/23 1930 07/29/23 2000 07/29/23 2145 07/29/23 2215  BP: (!) 117/53 112/77 115/63 113/69  Pulse: 78 92 76 87  Temp:      Resp: (!) 22 19 (!) 21 (!) 21  Height:      Weight:      SpO2: 96% 97% 97% 96%  TempSrc:      BMI (Calculated):      Initial EKG shows sinus tachycardia at 101 with a PR of 141 QTc 428 no ST changes. Repeat EKG showed sinus rhythm at 85 with a PR of 133 QTc 476 open with no ST changes. EDMD discussed case with on-call cardiology who recommended treatment as an  NSTEMI and discussion with STEMI MD if troponin rises or clinically patient's condition worsens. Labs are notable for : -Metabolic panel shows glucose of 339 sodium 130 normal kidney function normal LFTs initial troponin of 817, repeat of 789. -CBC shows leukocytosis of 12.6 anemia with a hemoglobin of 11.4 MCV of 72.9 RDW of 17.2 and platelets of  431.  In the ED pt received: Medications  nitroGLYCERIN 50 mg in dextrose 5 % 250 mL (0.2 mg/mL) infusion (0 mcg/min Intravenous Paused 07/29/23 2119)  heparin ADULT infusion 100 units/mL (25000 units/243mL) (1,250 Units/hr Intravenous New Bag/Given 07/29/23 1923)  fentaNYL (SUBLIMAZE) injection 25 mcg (has no administration in time range)  sodium chloride flush (NS) 0.9 % injection 3 mL (3 mLs Intravenous Given 07/29/23 2309)  acetaminophen (TYLENOL) tablet 650 mg (has no administration in time range)    Or  acetaminophen (TYLENOL) suppository 650 mg (has no administration in time range)  HYDROcodone-acetaminophen (NORCO/VICODIN) 5-325 MG per tablet 1 tablet (has no administration in time range)  lactated ringers infusion ( Intravenous New Bag/Given 07/29/23 2307)  aspirin EC tablet 81 mg (81 mg Oral Given 07/29/23 2307)  nicotine (NICODERM CQ - dosed in mg/24 hours) patch 21 mg (21 mg Transdermal Patient Refused/Not Given 07/29/23 2309)  insulin aspart (novoLOG) injection 0-9 Units (has no administration in time range)  fentaNYL (SUBLIMAZE) injection 50 mcg (50 mcg Intravenous Given 07/29/23 1711)  fentaNYL (SUBLIMAZE) injection 50 mcg (50 mcg Intravenous Given 07/29/23 1827)  ondansetron (ZOFRAN) injection 4 mg (4 mg Intravenous Given 07/29/23 1724)  iohexol (OMNIPAQUE) 350 MG/ML injection 100 mL (100 mLs Intravenous Contrast Given 07/29/23 1753)  metoCLOPramide (REGLAN) injection 10 mg (10 mg Intravenous Given 07/29/23 1847)  nitroGLYCERIN (NITROSTAT) SL tablet 0.4 mg (0.4 mg Sublingual Given 07/29/23 1931)  heparin bolus via infusion 4,000 Units (4,000 Units Intravenous Bolus from Bag 07/29/23 1924)  nitroGLYCERIN (NITROSTAT) SL tablet 0.4 mg (0.4 mg Sublingual Given 07/30/23 0030)     Review of Systems  Cardiovascular:  Positive for chest pain. Negative for leg swelling.  All other systems reviewed and are negative.  Past Medical History:  Diagnosis Date   Acute ST elevation  myocardial infarction (STEMI) involving other coronary artery of inferior wall (HCC) 07/21/2023   Barrett esophagus    Diabetes mellitus without complication (HCC)    Gastroparesis    Migraines    MRSA (methicillin resistant staph aureus) culture positive    2009   Obesity    ST elevation myocardial infarction (STEMI) (HCC) 05/02/2021   Type 2 diabetes mellitus (HCC) 09/06/2016   Last Assessment & Plan:    Ordered new glucometer and test strips  Last Assessment & Plan:    Ordered new glucometer and test strips     Past Surgical History:  Procedure Laterality Date   CARDIAC CATHETERIZATION     CESAREAN SECTION     CORONARY STENT INTERVENTION N/A 05/02/2021   Procedure: CORONARY STENT INTERVENTION;  Surgeon: Yvonne Kendall, MD;  Location: [facility] INVASIVE CV LAB;  Service: Cardiovascular;  Laterality: N/A;   CORONARY ULTRASOUND/IVUS N/A 05/02/2021   Procedure: Intravascular Ultrasound/IVUS;  Surgeon: Yvonne Kendall, MD;  Location: [facility] INVASIVE CV LAB;  Service: Cardiovascular;  Laterality: N/A;   CORONARY ULTRASOUND/IVUS N/A 07/23/2023   Procedure: Coronary Ultrasound/IVUS;  Surgeon: Iran Ouch, MD;  Location: [facility] INVASIVE CV LAB;  Service: Cardiovascular;  Laterality: N/A;   CORONARY/GRAFT ACUTE MI REVASCULARIZATION N/A 07/20/2023   Procedure: Coronary/Graft Acute MI Revascularization;  Surgeon: Iran Ouch, MD;  Location:  [facility] INVASIVE CV LAB;  Service: Cardiovascular;  Laterality: N/A;   CORONARY/GRAFT ACUTE MI REVASCULARIZATION N/A 07/23/2023   Procedure: Coronary/Graft Acute MI Revascularization;  Surgeon: Iran Ouch, MD;  Location: [facility] INV[facility]IVE CV LAB;  Service: Cardiovascular;  Laterality: N/A;   ESOPHAGOGASTRODUODENOSCOPY N/A 07/28/2019   Procedure: ESOPHAGOGASTRODUODENOSCOPY (EGD);  Surgeon: Toney Reil, MD;  Location: [facility] ENDOSCOPY;  Service: Gastroenterology;  Laterality: N/A;   LEFT HEART CATH AND CORONARY ANGIOGRAPHY N/A 05/02/2021    Procedure: LEFT HEART CATH AND CORONARY ANGIOGRAPHY;  Surgeon: Yvonne Kendall, MD;  Location: [facility] INVASIVE CV LAB;  Service: Cardiovascular;  Laterality: N/A;   LEFT HEART CATH AND CORONARY ANGIOGRAPHY N/A 07/20/2023   Procedure: LEFT HEART CATH AND CORONARY ANGIOGRAPHY;  Surgeon: Iran Ouch, MD;  Location: [facility] INVAS[facility] CV LAB;  Service: Cardiovascular;  Laterality: N/A;   LEFT HEART CATH AND CORONARY ANGIOGRAPHY N/A 07/23/2023   Procedure: LEFT HEART CATH AND CORONARY ANGIOGRAPHY;  Surgeon: Iran Ouch, MD;  Location: [facility] INVASIVE CV LAB;  Service: Cardiovascular;  Laterality: N/A;    reports that she has been smoking cigarettes. She has never used smokeless tobacco. She reports current alcohol use. She reports current drug use. Drug: Marijuana.  Allergies  Allergen Reactions   Quinolones Other (See Comments)    Family History  Problem Relation Age of Onset   Diabetes Mother    Cancer Mother    Heart failure Mother    Hypertension Mother    Heart attack Father    Diabetes Father    Hypertension Father     Prior to Admission medications   Medication Sig Start Date End Date Taking? Authorizing Provider  aspirin EC 81 MG EC tablet Take 1 tablet (81 mg total) by mouth daily. Swallow whole. 05/04/21   Delfino Lovett, MD  atorvastatin (LIPITOR) 80 MG tablet Take 1 tablet (80 mg total) by mouth daily. 07/22/23 10/20/23  Arnetha Courser, MD  escitalopram (LEXAPRO) 10 MG tablet Take 1 tablet (10 mg total) by mouth daily. 07/22/23 10/20/23  Arnetha Courser, MD  insulin aspart (NOVOLOG) 100 UNIT/ML injection Inject 0-9 Units into the skin 3 (three) times daily with meals. 07/16/19   Katha Hamming, MD  metoprolol succinate (TOPROL-XL) 100 MG 24 hr tablet Take 1 tablet (100 mg total) by mouth daily. Take with or immediately following a meal. PLEASE SCHEDULE OFFICE VISIT FOR FURTHER REFILLS. THANK YOU! 07/22/23 10/20/23  Arnetha Courser, MD  pantoprazole (PROTONIX) 40 MG tablet Take 1  tablet (40 mg total) by mouth daily. PLEASE SCHEDULE OFFICE VISIT FOR FURTHER REFILLS. THANK YOU! 07/22/23 10/20/23  Arnetha Courser, MD  promethazine (PHENERGAN) 25 MG suppository Place 1 suppository (25 mg total) rectally every 8 (eight) hours as needed for refractory nausea / vomiting. 06/29/21   Shaune Pollack, MD  ticagrelor (BRILINTA) 90 MG TABS tablet Take 1 tablet (90 mg total) by mouth 2 (two) times daily. 07/22/23   Arnetha Courser, MD     Vitals:   07/29/23 1930 07/29/23 2000 07/29/23 2145 07/29/23 2215  BP: (!) 117/53 112/77 115/63 113/69  Pulse: 78 92 76 87  Resp: (!) 22 19 (!) 21 (!) 21  Temp:      TempSrc:      SpO2: 96% 97% 97% 96%  Weight:      Height:       Physical Exam Vitals and nursing note reviewed.  Constitutional:      General: She is not in acute distress. HENT:     Head: Normocephalic and  atraumatic.     Right Ear: Hearing normal.     Left Ear: Hearing normal.     Nose: Nose normal. No nasal deformity.     Mouth/Throat:     Lips: Pink.     Tongue: No lesions.     Pharynx: Oropharynx is clear.  Eyes:     General: Lids are normal.     Extraocular Movements: Extraocular movements intact.  Cardiovascular:     Rate and Rhythm: Normal rate and regular rhythm.     Heart sounds: Normal heart sounds.  Pulmonary:     Effort: Pulmonary effort is normal.     Breath sounds: Normal breath sounds.  Chest:    Abdominal:     General: Bowel sounds are normal. There is no distension.     Palpations: Abdomen is soft. There is no mass.     Tenderness: There is no abdominal tenderness.  Musculoskeletal:     Right lower leg: No edema.     Left lower leg: No edema.  Skin:    General: Skin is warm.  Neurological:     General: No focal deficit present.     Mental Status: She is alert and oriented to person, place, and time.     Cranial Nerves: Cranial nerves 2-12 are intact.  Psychiatric:        Attention and Perception: Attention normal.        Mood and Affect:  Mood normal.        Speech: Speech normal.        Behavior: Behavior normal. Behavior is cooperative.      Labs on Admission: I have personally reviewed following labs and imaging studies Results for orders placed or performed during the hospital encounter of 07/29/23 (from the past 24 hour(s))  Troponin I (High Sensitivity)     Status: Abnormal   Collection Time: 07/29/23  4:40 PM  Result Value Ref Range   Troponin I (High Sensitivity) 817 (HH) <18 ng/L  Basic metabolic panel     Status: Abnormal   Collection Time: 07/29/23  4:40 PM  Result Value Ref Range   Sodium 130 (L) 135 - 145 mmol/L   Potassium 4.0 3.5 - 5.1 mmol/L   Chloride 97 (L) 98 - 111 mmol/L   CO2 23 22 - 32 mmol/L   Glucose, Bld 339 (H) 70 - 99 mg/dL   BUN 11 6 - 20 mg/dL   Creatinine, Ser 1.61 0.44 - 1.00 mg/dL   Calcium 8.7 (L) 8.9 - 10.3 mg/dL   GFR, Estimated >09 >60 mL/min   Anion gap 10 5 - 15  CBC with Differential     Status: Abnormal   Collection Time: 07/29/23  4:40 PM  Result Value Ref Range   WBC 12.6 (H) 4.0 - 10.5 K/uL   RBC 5.04 3.87 - 5.11 MIL/uL   Hemoglobin 11.4 (L) 12.0 - 15.0 g/dL   HCT 45.4 09.8 - 11.9 %   MCV 72.6 (L) 80.0 - 100.0 fL   MCH 22.6 (L) 26.0 - 34.0 pg   MCHC 31.1 30.0 - 36.0 g/dL   RDW 14.7 (H) 82.9 - 56.2 %   Platelets 431 (H) 150 - 400 K/uL   nRBC 0.0 0.0 - 0.2 %   Neutrophils Relative % 61 %   Neutro Abs 7.8 (H) 1.7 - 7.7 K/uL   Lymphocytes Relative 26 %   Lymphs Abs 3.3 0.7 - 4.0 K/uL   Monocytes Relative 8 %   Monocytes Absolute  1.0 0.1 - 1.0 K/uL   Eosinophils Relative 3 %   Eosinophils Absolute 0.4 0.0 - 0.5 K/uL   Basophils Relative 1 %   Basophils Absolute 0.1 0.0 - 0.1 K/uL   Immature Granulocytes 1 %   Abs Immature Granulocytes 0.07 0.00 - 0.07 K/uL  hCG, quantitative, pregnancy     Status: None   Collection Time: 07/29/23  4:40 PM  Result Value Ref Range   hCG, Beta Chain, Quant, S 1 <5 mIU/mL  Hepatic function panel     Status: None   Collection  Time: 07/29/23  4:40 PM  Result Value Ref Range   Total Protein 7.3 6.5 - 8.1 g/dL   Albumin 3.5 3.5 - 5.0 g/dL   AST 19 15 - 41 U/L   ALT 20 0 - 44 U/L   Alkaline Phosphatase 85 38 - 126 U/L   Total Bilirubin 0.4 0.3 - 1.2 mg/dL   Bilirubin, Direct 0.1 0.0 - 0.2 mg/dL   Indirect Bilirubin 0.3 0.3 - 0.9 mg/dL  APTT     Status: None   Collection Time: 07/29/23  4:40 PM  Result Value Ref Range   aPTT 31 24 - 36 seconds  Protime-INR     Status: None   Collection Time: 07/29/23  4:40 PM  Result Value Ref Range   Prothrombin Time 13.4 11.4 - 15.2 seconds   INR 1.0 0.8 - 1.2  Troponin I (High Sensitivity)     Status: Abnormal   Collection Time: 07/29/23  6:33 PM  Result Value Ref Range   Troponin I (High Sensitivity) 789 (HH) <18 ng/L  T4, free     Status: None   Collection Time: 07/29/23  6:33 PM  Result Value Ref Range   Free T4 0.91 0.61 - 1.12 ng/dL  TSH     Status: Abnormal   Collection Time: 07/29/23  6:33 PM  Result Value Ref Range   TSH 4.787 (H) 0.350 - 4.500 uIU/mL  Troponin I (High Sensitivity)     Status: Abnormal   Collection Time: 07/30/23 12:09 AM  Result Value Ref Range   Troponin I (High Sensitivity) 622 (HH) <18 ng/L  Glucose, capillary     Status: Abnormal   Collection Time: 07/30/23  1:08 AM  Result Value Ref Range   Glucose-Capillary 236 (H) 70 - 99 mg/dL    CBC: Recent Labs  Lab 07/23/23 2022 07/24/23 0230 07/29/23 1640  WBC 12.0* 10.4 12.6*  NEUTROABS 9.1*  --  7.8*  HGB 11.8* 10.3* 11.4*  HCT 38.1 34.3* 36.6  MCV 74.1* 74.6* 72.6*  PLT 394 356 431*   Basic Metabolic Panel: Recent Labs  Lab 07/23/23 2022 07/24/23 0230 07/29/23 1640  NA 132* 131* 130*  K 5.0 3.9 4.0  CL 101 102 97*  CO2 22 20* 23  GLUCOSE 326* 409* 339*  BUN 16 16 11   CREATININE 0.61  0.57 0.52 0.57  CALCIUM 8.4* 8.0* 8.7*   GFR: Estimated Creatinine Clearance: 121.3 mL/min (by C-G formula based on SCr of 0.57 mg/dL). Liver Function Tests: Recent Labs  Lab  07/29/23 1640  AST 19  ALT 20  ALKPHOS 85  BILITOT 0.4  PROT 7.3  ALBUMIN 3.5   No results for input(s): "LIPASE", "AMYLASE" in the last 168 hours. No results for input(s): "AMMONIA" in the last 168 hours. Coagulation Profile: Recent Labs  Lab 07/29/23 1640  INR 1.0   Cardiac Enzymes: No results for input(s): "CKTOTAL", "CKMB", "CKMBINDEX", "TROPONINI" in the last 168  hours. BNP (last 3 results) No results for input(s): "PROBNP" in the last 8760 hours. HbA1C: No results for input(s): "HGBA1C" in the last 72 hours. CBG: Recent Labs  Lab 07/23/23 2017 07/24/23 0830 07/24/23 1128 07/30/23 0108  GLUCAP 297* 232* 222* 236*   Lipid Profile: No results for input(s): "CHOL", "HDL", "LDLCALC", "TRIG", "CHOLHDL", "LDLDIRECT" in the last 72 hours. Thyroid Function Tests: Recent Labs    07/29/23 1833  TSH 4.787*  FREET4 0.91   Anemia Panel: No results for input(s): "VITAMINB12", "FOLATE", "FERRITIN", "TIBC", "IRON", "RETICCTPCT" in the last 72 hours. Urinalysis    Component Value Date/Time   COLORURINE YELLOW (A) 11/21/2021 1400   APPEARANCEUR HAZY (A) 11/21/2021 1400   LABSPEC 1.036 (H) 11/21/2021 1400   PHURINE 8.0 11/21/2021 1400   GLUCOSEU >=500 (A) 11/21/2021 1400   HGBUR NEGATIVE 11/21/2021 1400   BILIRUBINUR NEGATIVE 11/21/2021 1400   KETONESUR 80 (A) 11/21/2021 1400   PROTEINUR NEGATIVE 11/21/2021 1400   NITRITE NEGATIVE 11/21/2021 1400   LEUKOCYTESUR NEGATIVE 11/21/2021 1400      Unresulted Labs (From admission, onward)     Start     Ordered   07/30/23 0501  HIV Antibody (routine testing w rflx)  (HIV Antibody (Routine testing w reflex) panel)  Once,   R        07/29/23 2326   07/30/23 0500  CBC  Tomorrow morning,   R        07/29/23 1902   07/30/23 0500  Comprehensive metabolic panel  Tomorrow morning,   R        07/29/23 2158   07/30/23 0130  Heparin level (unfractionated)  Once-Timed,   URGENT        07/29/23 1926   07/30/23 0119  Gamma GT   Add-on,   AD        07/30/23 0118            Medications  nitroGLYCERIN 50 mg in dextrose 5 % 250 mL (0.2 mg/mL) infusion (0 mcg/min Intravenous Paused 07/29/23 2119)  heparin ADULT infusion 100 units/mL (25000 units/278mL) (1,250 Units/hr Intravenous New Bag/Given 07/29/23 1923)  fentaNYL (SUBLIMAZE) injection 25 mcg (has no administration in time range)  sodium chloride flush (NS) 0.9 % injection 3 mL (3 mLs Intravenous Given 07/29/23 2309)  acetaminophen (TYLENOL) tablet 650 mg (has no administration in time range)    Or  acetaminophen (TYLENOL) suppository 650 mg (has no administration in time range)  HYDROcodone-acetaminophen (NORCO/VICODIN) 5-325 MG per tablet 1 tablet (has no administration in time range)  lactated ringers infusion ( Intravenous New Bag/Given 07/29/23 2307)  aspirin EC tablet 81 mg (81 mg Oral Given 07/29/23 2307)  nicotine (NICODERM CQ - dosed in mg/24 hours) patch 21 mg (21 mg Transdermal Patient Refused/Not Given 07/29/23 2309)  insulin aspart (novoLOG) injection 0-9 Units (has no administration in time range)  fentaNYL (SUBLIMAZE) injection 50 mcg (50 mcg Intravenous Given 07/29/23 1711)  fentaNYL (SUBLIMAZE) injection 50 mcg (50 mcg Intravenous Given 07/29/23 1827)  ondansetron (ZOFRAN) injection 4 mg (4 mg Intravenous Given 07/29/23 1724)  iohexol (OMNIPAQUE) 350 MG/ML injection 100 mL (100 mLs Intravenous Contrast Given 07/29/23 1753)  metoCLOPramide (REGLAN) injection 10 mg (10 mg Intravenous Given 07/29/23 1847)  nitroGLYCERIN (NITROSTAT) SL tablet 0.4 mg (0.4 mg Sublingual Given 07/29/23 1931)  heparin bolus via infusion 4,000 Units (4,000 Units Intravenous Bolus from Bag 07/29/23 1924)  nitroGLYCERIN (NITROSTAT) SL tablet 0.4 mg (0.4 mg Sublingual Given 07/30/23 0030)    Radiological Exams on  Admission: CT Angio Chest/Abd/Pel for Dissection W and/or Wo Contrast  Result Date: 07/29/2023 CLINICAL DATA:  Severe chest pain EXAM: CT ANGIOGRAPHY CHEST,  ABDOMEN AND PELVIS TECHNIQUE: Non-contrast CT of the chest was initially obtained. Multidetector CT imaging through the chest, abdomen and pelvis was performed using the standard protocol during bolus administration of intravenous contrast. Multiplanar reconstructed images and MIPs were obtained and reviewed to evaluate the vascular anatomy. RADIATION DOSE REDUCTION: This exam was performed according to the departmental dose-optimization program which includes automated exposure control, adjustment of the mA and/or kV according to patient size and/or use of iterative reconstruction technique. CONTRAST:  OMNIPAQUE IOHEXOL 350 MG/ML SOLN COMPARISON:  Chest CT 02/19/2023, CT abdomen pelvis 12/30/2021 FINDINGS: CTA CHEST FINDINGS Cardiovascular: Non contrasted images of the chest demonstrate no acute intramural hematoma. Negative for aortic aneurysm or dissection. Cardiac size is normal. Coronary calcifications and stent. No pericardial effusion Mediastinum/Nodes: Midline trachea. No thyroid mass. No suspicious lymph nodes. Esophagus within normal limits. Increased fluid density structure in the right cardio phrenic fat, measuring 2.8 by 1.9 cm. Lungs/Pleura: Lungs are clear. No pleural effusion or pneumothorax. Musculoskeletal: No chest wall abnormality. No acute or significant osseous findings. Review of the MIP images confirms the above findings. CTA ABDOMEN AND PELVIS FINDINGS VASCULAR Aorta: Normal caliber aorta without aneurysm, dissection, vasculitis or significant stenosis. Mild atherosclerosis Celiac: Patent without evidence of aneurysm, dissection, vasculitis or significant stenosis. SMA: Patent without evidence of aneurysm, dissection, vasculitis or significant stenosis. Renals: Both renal arteries are patent without evidence of aneurysm, dissection, vasculitis, fibromuscular dysplasia or significant stenosis. IMA: Patent without evidence of aneurysm, dissection, vasculitis or significant stenosis.  Inflow: Patent without evidence of aneurysm, dissection, vasculitis or significant stenosis. Veins: Suboptimally evaluated. Review of the MIP images confirms the above findings. NON-VASCULAR Hepatobiliary: Small gallstones. No focal hepatic abnormality or biliary dilatation. Pancreas: Unremarkable. No pancreatic ductal dilatation or surrounding inflammatory changes. Spleen: Normal in size without focal abnormality. Adrenals/Urinary Tract: Adrenal glands are unremarkable. Kidneys are normal, without renal calculi, focal lesion, or hydronephrosis. Bladder is unremarkable. Stomach/Bowel: Stomach is within normal limits. Appendix appears normal. No evidence of bowel wall thickening, distention, or inflammatory changes. Lymphatic: No suspicious lymph nodes. Reproductive: Uterus and bilateral adnexa are unremarkable. Other: Negative for pelvic effusion or free air Musculoskeletal: No acute osseous abnormality Review of the MIP images confirms the above findings. IMPRESSION: 1. Negative for aortic aneurysm or dissection. 2. No CT evidence for acute intrathoracic, abdominal, or pelvic abnormality. 3. Cholelithiasis. 4. Increased fluid density structure in the right cardio phrenic fat, measuring 2.8 x 1.9 cm, possible enlarging pericardial cyst. Attention on follow-up chest CT. Electronically Signed   By: Jasmine Pang M.D.   On: 07/29/2023 21:33     Data Reviewed: Relevant notes from primary care and specialist visits, past discharge summaries as available in EHR, including Care Everywhere. Prior diagnostic testing as pertinent to current admission diagnoses Updated medications and problem lists for reconciliation ED course, including vitals, labs, imaging, treatment and response to treatment Triage notes, nursing and pharmacy notes and ED provider's notes Notable results as noted in HPI  Assessment and Plan: * Non-ST elevated myocardial infarction [facility]) Patient continued on heparin gtt, nitroglycerin chest  pain.  Patient states the chest pain is currently 3 out of 10 she looks uncomfortable.  Will admit to stepdown unit as blood pressures have trended down and may need VP support measures.   Uncontrolled type 2 diabetes mellitus with hyperglycemia (HCC) Glycemic protocol.  Carbohydrate  consistent cardiac diet but current. NPO except for meds.   Essential hypertension Will currently hold patient's metoprolol succinate 100 mg daily as patient's blood pressures are soft and she is on a nitroglycerin drip. Vitals:   07/29/23 1641 07/29/23 1644 07/29/23 1930 07/29/23 2000  BP: (!) 158/135 (!) 158/135 (!) 117/53 112/77     Tobacco abuse Nicotine patch.    Prognosis: Stable.  DVT prophylaxis:  Heparin gtt. Consults:  Cardiology: Bridgette christopher.  Advance Care Planning:    Code Status: Full Code   Family Communication:  Spouse at bedside.   Disposition Plan:  TBD.  Severity of Illness: The appropriate patient status for this patient is INPATIENT. Inpatient status is judged to be reasonable and necessary in order to provide the required intensity of service to ensure the patient's safety. The patient's presenting symptoms, physical exam findings, and initial radiographic and laboratory data in the context of their chronic comorbidities is felt to place them at high risk for further clinical deterioration. Furthermore, it is not anticipated that the patient will be medically stable for discharge from the hospital within 2 midnights of admission.   * I certify that at the point of admission it is my clinical judgment that the patient will require inpatient hospital care spanning beyond 2 midnights from the point of admission due to high intensity of service, high risk for further deterioration and high frequency of surveillance required.*  Author: Gertha Calkin, MD 07/30/2023 1:19 AM  For on call review [email or web address].

## 2023-07-29 NOTE — ED Notes (Signed)
Dr. Hassie Bruce given EKG, pt writhing in bed around in pain stating that this is how her last cardiac event has felt.

## 2023-07-29 NOTE — Assessment & Plan Note (Signed)
-  Nicotine patch 

## 2023-07-29 NOTE — ED Notes (Signed)
Dr. Hassie Bruce in at bedside

## 2023-07-29 NOTE — ED Provider Notes (Incomplete)
[facility] Provider Note    Event Date/Time   First MD Initiated Contact with Patient 07/29/23 1643     (approximate)   History   Chest Pain   HPI  Karen Ruiz is a 42 year old female with history of T2DM, gastroparesis, cocaine abuse, CAD presenting to the emergency department for evaluation of chest pain.  Shortly prior to presentation, patient had onset of severe chest pain described as a pressure in the center of her chest radiating down her left arm.  Received 324 of aspirin with EMS as well as sublingual nitroglycerin without significant change.  Acutely uncomfortable on my exam, let ability to provide history.  I did review her multiple recent admissions.  On 10/19, she presented with inferolateral ST elevation and had an emergent cardiac catheterization demonstrating severe stenosis in the distal left circumflex at the bifurcation of the OM branch and she had 2 stents placed.  She was discharged on 10/21, but presented back with chest pain and was found to have recurrent inferolateral ST elevation that was thought to be due to subacute stent thrombosis.  She was chest pain-free after the procedure and discharged on 07/24/2023.       Physical Exam   Triage Vital Signs: ED Triage Vitals  Encounter Vitals Group     BP 07/29/23 1641 (!) 158/135     Systolic BP Percentile --      Diastolic BP Percentile --      Pulse Rate 07/29/23 1641 (!) 102     Resp 07/29/23 1641 (!) 29     Temp 07/29/23 1641 98.2 F (36.8 C)     Temp Source 07/29/23 1641 Oral     SpO2 07/29/23 1641 100 %     Weight 07/29/23 1639 230 lb (104.3 kg)     Height 07/29/23 1639 6' (1.829 m)     Head Circumference --      Peak Flow --      Pain Score 07/29/23 1639 10     Pain Loc --      Pain Education --      Exclude from Growth Chart --     Most recent vital signs: Vitals:   07/29/23 1930 07/29/23 2000  BP: (!) 117/53 112/77  Pulse: 78 92  Resp: (!) 22 19  Temp:     SpO2: 96% 97%     General: Awake, interactive, appears uncomfortable CV:  Mild tachycardia with regular rhythm, normal peripheral perfusion  Resp:  Tachypneic with somewhat labored respirations in the setting of acute pain, significantly improved on reevaluation Abd:  Soft nontender, nondistended  Neuro:  Symmetric facial movement, fluid speech   ED Results / Procedures / Treatments   Labs (all labs ordered are listed, but only abnormal results are displayed) Labs Reviewed  BASIC METABOLIC PANEL - Abnormal; Notable for the following components:      Result Value   Sodium 130 (*)    Chloride 97 (*)    Glucose, Bld 339 (*)    Calcium 8.7 (*)    All other components within normal limits  CBC WITH DIFFERENTIAL/PLATELET - Abnormal; Notable for the following components:   WBC 12.6 (*)    Hemoglobin 11.4 (*)    MCV 72.6 (*)    MCH 22.6 (*)    RDW 17.2 (*)    Platelets 431 (*)    Neutro Abs 7.8 (*)    All other components within normal limits  TROPONIN I (HIGH SENSITIVITY) -  Abnormal; Notable for the following components:   Troponin I (High Sensitivity) 817 (*)    All other components within normal limits  TROPONIN I (HIGH SENSITIVITY) - Abnormal; Notable for the following components:   Troponin I (High Sensitivity) 789 (*)    All other components within normal limits  HCG, QUANTITATIVE, PREGNANCY  HEPATIC FUNCTION PANEL  APTT  PROTIME-INR  CBC  HEPARIN LEVEL (UNFRACTIONATED)  POC URINE PREG, ED     EKG EKG independently reviewed interpreted by myself (ER attending) demonstrates:  EKG from 1637 demonstrate sinus tachycardia at a rate of 101, PR 141, QRS 91, QTc 428, no acute ST changes, previously noted ST elevation in inferior leads resolved. EKG from 1849 redemonstrate sinus tachycardia at a rate of 97, PR 137, QRS 104, QTc 450, remains without acute ST changes EKG at 1941 demonstrate sinus rhythm at a rate of 85, PR 133, QRS 116, QTc 476, remains without acute ST  changes  RADIOLOGY Imaging independently reviewed and interpreted by myself demonstrates:  CTA without visible dissection on my review  PROCEDURES:  Critical Care performed: Yes, see critical care procedure note(s)  CRITICAL CARE Performed by: Trinna Post   Total critical care time: 45 minutes  Critical care time was exclusive of separately billable procedures and treating other patients.  Critical care was necessary to treat or prevent imminent or life-threatening deterioration.  Critical care was time spent personally by me on the following activities: development of treatment plan with patient and/or surrogate as well as nursing, discussions with consultants, evaluation of patient's response to treatment, examination of patient, obtaining history from patient or surrogate, ordering and performing treatments and interventions, ordering and review of laboratory studies, ordering and review of radiographic studies, pulse oximetry and re-evaluation of patient's condition.   Procedures   MEDICATIONS ORDERED IN ED: Medications  nitroGLYCERIN 50 mg in dextrose 5 % 250 mL (0.2 mg/mL) infusion (5 mcg/min Intravenous New Bag/Given 07/29/23 2000)  heparin ADULT infusion 100 units/mL (25000 units/232mL) (1,250 Units/hr Intravenous New Bag/Given 07/29/23 1923)  fentaNYL (SUBLIMAZE) injection 50 mcg (50 mcg Intravenous Given 07/29/23 1711)  fentaNYL (SUBLIMAZE) injection 50 mcg (50 mcg Intravenous Given 07/29/23 1827)  ondansetron (ZOFRAN) injection 4 mg (4 mg Intravenous Given 07/29/23 1724)  iohexol (OMNIPAQUE) 350 MG/ML injection 100 mL (100 mLs Intravenous Contrast Given 07/29/23 1753)  metoCLOPramide (REGLAN) injection 10 mg (10 mg Intravenous Given 07/29/23 1847)  nitroGLYCERIN (NITROSTAT) SL tablet 0.4 mg (0.4 mg Sublingual Given 07/29/23 1931)  heparin bolus via infusion 4,000 Units (4,000 Units Intravenous Bolus from Bag 07/29/23 1924)     IMPRESSION / MDM / ASSESSMENT AND PLAN /  ED COURSE  I reviewed the triage vital signs and the nursing notes.  Differential diagnosis includes, but is not limited to, ACS due to stent thrombosis, acute occlusion, unstable angina, pericarditis, pneumothorax, dissection  Patient's presentation is most consistent with acute presentation with potential threat to life or bodily function.  42 year old female presenting with acute onset of chest pain, recent complicated history including multiple STEMI's.  Mild tachycardia on presentation, uncomfortable with significant pain.  EKG fortunately without acute ischemic changes.  Given fentanyl with significant improvement in her pain.  Labs with elevated troponin at 817, similar to presentation 6 days ago.  Case was discussed with Dr. Jodelle Red with cardiology.  She recommended evaluating patient's repeat troponin.  If this was relatively flat, recommended management as an NSTEMI.  Did recommend repeat EKGs and if patient developed dynamic EKG changes or her  troponin was going up, recommended discussion with STEMI doctor for possible intervention.  Repeat troponin remained stable to downtrending at 789.  Patient with 1 episode of recurrent pain that improved with fentanyl.  Heparin drip and nitro drip ordered.  Discussed recommendation of admission for further evaluation and patient is comfortable with this plan.  Will reach out to hospitalist team.  Case discussed with Dr. Allena Katz with hospitalist team.  She evaluated the patient and did recommend discussion with ICU to ensure they felt that she was appropriate for stepdown.  Reviewed with NP Ouma who did confirm that patient could be admitted to stepdown.  Dr. Allena Katz will evaluate the patient for anticipated admission.      FINAL CLINICAL IMPRESSION(S) / ED DIAGNOSES   Final diagnoses:  NSTEMI (non-ST elevated myocardial infarction) (HCC)     Rx / DC Orders   ED Discharge Orders     None        Note:  This document was  prepared using Dragon voice recognition software and may include unintentional dictation errors.   Trinna Post, MD 07/30/23 Jorje Guild    Trinna Post, MD 07/30/23 4540

## 2023-07-29 NOTE — ED Triage Notes (Signed)
Pt arrived via EMS from home. Pt was walking to the store and began to have severe chest pain that radiates down the left arm. EMS gave 4 81mg  of asprin and 1 sublingual nitroglycerin with no relief. Pt had a STEMI MI last week and two stents were placed. Pt states that this pain is the same as last week when she had the STEMI.

## 2023-07-29 NOTE — Progress Notes (Addendum)
PHARMACY - ANTICOAGULATION CONSULT NOTE  Pharmacy Consult for heparin drip Indication: chest pain/ACS  Allergies  Allergen Reactions   Quinolones Other (See Comments)    Patient Measurements: Height: 6' (182.9 cm) Weight: 104.3 kg (230 lb) IBW/kg (Calculated) : 73.1 Heparin Dosing Weight: 95.3 kg  Vital Signs: Temp: 98.2 F (36.8 C) (10/28 1644) Temp Source: Oral (10/28 1641) BP: 158/135 (10/28 1644) Pulse Rate: 104 (10/28 1644)  Labs: Recent Labs    07/29/23 1640  HGB 11.4*  HCT 36.6  PLT 431*  CREATININE 0.57  TROPONINIHS 817*    Estimated Creatinine Clearance: 121.3 mL/min (by C-G formula based on SCr of 0.57 mg/dL).   Medical History: Past Medical History:  Diagnosis Date   Barrett esophagus    Diabetes mellitus without complication (HCC)    Gastroparesis    Migraines    MRSA (methicillin resistant staph aureus) culture positive    2009   Obesity     Medications:  (Not in a hospital admission)  Scheduled:   heparin  4,000 Units Intravenous Once   nitroGLYCERIN  0.4 mg Sublingual Once   Infusions:   heparin     nitroGLYCERIN      Assessment: 42 yo F to start heparin drip for ACS/STEMI.  HX DM2, gastroparesis, cocaine abuse. Admitted w/ CP, recent STEMI + stents, recently discharged 07/24/23. On Ticagrelor PTA Hgb 11.4  Plt 431   INR 1.0  aPTT 31   Goal of Therapy:  Heparin level 0.3-0.7 units/ml Monitor platelets by anticoagulation protocol: Yes   Plan:  Give 4000 units bolus x 1 Start heparin infusion at 1250 units/hr Check anti-Xa level in 6 hours and daily while on heparin Continue to monitor H&H and platelets  Bari Mantis PharmD Clinical Pharmacist 07/29/2023

## 2023-07-29 NOTE — Assessment & Plan Note (Signed)
Will currently hold patient's metoprolol succinate 100 mg daily as patient's blood pressures are soft and she is on a nitroglycerin drip. Vitals:   07/29/23 1641 07/29/23 1644 07/29/23 1930 07/29/23 2000  BP: (!) 158/135 (!) 158/135 (!) 117/53 112/77

## 2023-07-29 NOTE — Assessment & Plan Note (Addendum)
Glycemic protocol.  Carbohydrate consistent cardiac diet but current. NPO except for meds.

## 2023-07-29 NOTE — ED Notes (Signed)
Visitor of the family has come out to the staff and complained that the doctor is not in the room and is angry. This RN entered the room and has assured that the doctor will be in as soon as possible. Pt continues to scream, and rock in the bed and yelling at this RN that she's having another heart attack and is demanding to see the doctor.

## 2023-07-29 NOTE — Assessment & Plan Note (Signed)
Patient continued on heparin gtt, nitroglycerin chest pain.  Patient states the chest pain is currently 3 out of 10 she looks uncomfortable.  Will admit to stepdown unit as blood pressures have trended down and may need VP support measures.

## 2023-07-30 ENCOUNTER — Other Ambulatory Visit (HOSPITAL_COMMUNITY): Payer: Self-pay

## 2023-07-30 ENCOUNTER — Inpatient Hospital Stay: Payer: Medicaid Other

## 2023-07-30 DIAGNOSIS — I214 Non-ST elevation (NSTEMI) myocardial infarction: Secondary | ICD-10-CM | POA: Diagnosis not present

## 2023-07-30 LAB — GLUCOSE, CAPILLARY
Glucose-Capillary: 236 mg/dL — ABNORMAL HIGH (ref 70–99)
Glucose-Capillary: 223 mg/dL — ABNORMAL HIGH (ref 70–99)
Glucose-Capillary: 191 mg/dL — ABNORMAL HIGH (ref 70–99)
Glucose-Capillary: 213 mg/dL — ABNORMAL HIGH (ref 70–99)
Glucose-Capillary: 277 mg/dL — ABNORMAL HIGH (ref 70–99)
Glucose-Capillary: 218 mg/dL — ABNORMAL HIGH (ref 70–99)
Glucose-Capillary: 198 mg/dL — ABNORMAL HIGH (ref 70–99)

## 2023-07-30 LAB — CBC
HCT: 33.2 % — ABNORMAL LOW (ref 36.0–46.0)
Hemoglobin: 10.1 g/dL — ABNORMAL LOW (ref 12.0–15.0)
MCH: 22.2 pg — ABNORMAL LOW (ref 26.0–34.0)
MCHC: 30.4 g/dL (ref 30.0–36.0)
MCV: 73.1 fL — ABNORMAL LOW (ref 80.0–100.0)
Platelets: 349 10*3/uL (ref 150–400)
RBC: 4.54 MIL/uL (ref 3.87–5.11)
RDW: 17.1 % — ABNORMAL HIGH (ref 11.5–15.5)
WBC: 11.2 10*3/uL — ABNORMAL HIGH (ref 4.0–10.5)
nRBC: 0 % (ref 0.0–0.2)

## 2023-07-30 LAB — PLATELET FUNCTION ASSAY: Collagen / Epinephrine: 130 s (ref 0–193)

## 2023-07-30 LAB — URINE DRUG SCREEN, QUALITATIVE (ARMC ONLY)
Amphetamines, Ur Screen: NOT DETECTED
Barbiturates, Ur Screen: NOT DETECTED
Benzodiazepine, Ur Scrn: NOT DETECTED
Cannabinoid 50 Ng, Ur ~~LOC~~: POSITIVE — AB
Cocaine Metabolite,Ur ~~LOC~~: NOT DETECTED
MDMA (Ecstasy)Ur Screen: NOT DETECTED
Methadone Scn, Ur: NOT DETECTED
Opiate, Ur Screen: NOT DETECTED
Phencyclidine (PCP) Ur S: NOT DETECTED
Tricyclic, Ur Screen: NOT DETECTED

## 2023-07-30 LAB — COMPREHENSIVE METABOLIC PANEL
ALT: 18 U/L (ref 0–44)
AST: 18 U/L (ref 15–41)
Albumin: 3 g/dL — ABNORMAL LOW (ref 3.5–5.0)
Alkaline Phosphatase: 69 U/L (ref 38–126)
Anion gap: 8 (ref 5–15)
BUN: 11 mg/dL (ref 6–20)
CO2: 24 mmol/L (ref 22–32)
Calcium: 8.1 mg/dL — ABNORMAL LOW (ref 8.9–10.3)
Chloride: 100 mmol/L (ref 98–111)
Creatinine, Ser: 0.49 mg/dL (ref 0.44–1.00)
GFR, Estimated: >60 mL/min (ref >60)
Glucose, Bld: 250 mg/dL — ABNORMAL HIGH (ref 70–99)
Potassium: 3.7 mmol/L (ref 3.5–5.1)
Sodium: 132 mmol/L — ABNORMAL LOW (ref 135–145)
Total Bilirubin: 0.5 mg/dL (ref 0.3–1.2)
Total Protein: 6.2 g/dL — ABNORMAL LOW (ref 6.5–8.1)

## 2023-07-30 LAB — HEPARIN LEVEL (UNFRACTIONATED)
Heparin Unfractionated: 0.22 [IU]/mL — ABNORMAL LOW (ref 0.30–0.70)
Heparin Unfractionated: 0.34 [IU]/mL (ref 0.30–0.70)
Heparin Unfractionated: 0.24 [IU]/mL — ABNORMAL LOW (ref 0.30–0.70)
Heparin Unfractionated: 0.46 [IU]/mL (ref 0.30–0.70)

## 2023-07-30 LAB — TROPONIN I (HIGH SENSITIVITY)
Troponin I (High Sensitivity): 622 ng/L (ref <18)
Troponin I (High Sensitivity): 655 ng/L (ref <18)

## 2023-07-30 LAB — FOLATE: Folate: 10.5 ng/mL (ref >5.9)

## 2023-07-30 LAB — GAMMA GT: GGT: 31 U/L (ref 7–50)

## 2023-07-30 LAB — IRON AND TIBC
Iron: 17 ug/dL — ABNORMAL LOW (ref 28–170)
Saturation Ratios: 4 % — ABNORMAL LOW (ref 10.4–31.8)
TIBC: 384 ug/dL (ref 250–450)
UIBC: 367 ug/dL

## 2023-07-30 LAB — VITAMIN B12: Vitamin B-12: 200 pg/mL (ref 180–914)

## 2023-07-30 LAB — HIV ANTIBODY (ROUTINE TESTING W REFLEX): HIV Screen 4th Generation wRfx: NONREACTIVE

## 2023-07-30 LAB — OSMOLALITY: Osmolality: 285 mosm/kg (ref 275–295)

## 2023-07-30 LAB — VITAMIN D 25 HYDROXY (VIT D DEFICIENCY, FRACTURES): Vit D, 25-Hydroxy: 39.07 ng/mL (ref 30–100)

## 2023-07-30 MED ORDER — HEPARIN BOLUS VIA INFUSION
1400.0000 [IU] | Freq: Once | INTRAVENOUS | Status: AC
  Administered 2023-07-30: 1400 [IU] via INTRAVENOUS
  Filled 2023-07-30: qty 1400

## 2023-07-30 MED ORDER — PRASUGREL HCL 10 MG PO TABS
10.0000 mg | ORAL_TABLET | Freq: Every day | ORAL | Status: DC
  Administered 2023-07-31: 10 mg via ORAL
  Filled 2023-07-30: qty 1

## 2023-07-30 MED ORDER — TICAGRELOR 90 MG PO TABS: 90.0000 mg | ORAL_TABLET | Freq: Two times a day (BID) | ORAL | Status: DC

## 2023-07-30 MED ORDER — ATORVASTATIN CALCIUM 80 MG PO TABS
80.0000 mg | ORAL_TABLET | Freq: Every day | ORAL | Status: DC
  Administered 2023-07-30 – 2023-07-31 (×2): 80 mg via ORAL
  Filled 2023-07-30 (×2): qty 4

## 2023-07-30 MED ORDER — PRASUGREL HCL 10 MG PO TABS
60.0000 mg | ORAL_TABLET | Freq: Once | ORAL | Status: AC
  Administered 2023-07-30: 60 mg via ORAL
  Filled 2023-07-30: qty 6

## 2023-07-30 MED ORDER — INSULIN ASPART 100 UNIT/ML IJ SOLN
0.0000 [IU] | INTRAMUSCULAR | Status: DC
  Administered 2023-07-30 (×2): 2 [IU] via SUBCUTANEOUS
  Administered 2023-07-30: 5 [IU] via SUBCUTANEOUS
  Administered 2023-07-30 – 2023-07-31 (×5): 3 [IU] via SUBCUTANEOUS
  Filled 2023-07-30 (×8): qty 1

## 2023-07-30 MED ORDER — CHLORHEXIDINE GLUCONATE CLOTH 2 % EX PADS
6.0000 | MEDICATED_PAD | Freq: Every day | CUTANEOUS | Status: DC
Start: 2023-07-30 — End: 2023-08-01
  Administered 2023-07-30 – 2023-07-31 (×2): 6 via TOPICAL

## 2023-07-30 MED ORDER — ONDANSETRON HCL 4 MG/2ML IJ SOLN
4.0000 mg | Freq: Once | INTRAMUSCULAR | Status: AC
  Administered 2023-07-30: 4 mg via INTRAVENOUS
  Filled 2023-07-30: qty 2

## 2023-07-30 MED ORDER — DILTIAZEM HCL 30 MG PO TABS
30.0000 mg | ORAL_TABLET | Freq: Four times a day (QID) | ORAL | Status: DC
  Administered 2023-07-30 (×3): 30 mg via ORAL
  Filled 2023-07-30 (×3): qty 1

## 2023-07-30 MED ORDER — ORAL CARE MOUTH RINSE: 15.0000 mL | OROMUCOSAL | Status: DC | PRN

## 2023-07-30 MED ORDER — INFLUENZA VIRUS VACC SPLIT PF (FLUZONE) 0.5 ML IM SUSY: 0.5000 mL | PREFILLED_SYRINGE | INTRAMUSCULAR | Status: DC

## 2023-07-30 MED ORDER — NITROGLYCERIN 0.4 MG SL SUBL
0.4000 mg | SUBLINGUAL_TABLET | Freq: Once | SUBLINGUAL | Status: AC
  Administered 2023-07-30: 0.4 mg via SUBLINGUAL

## 2023-07-30 NOTE — Progress Notes (Signed)
[facility] Progress Note  Patient: Karen Ruiz    MWN:[id number]  DOA: 07/29/2023     Date of Service: the patient was seen and examined on 07/30/2023  Chief Complaint  Patient presents with   Chest Pain   Brief hospital course: eather RICK MOSHE is a 42 y.o. female with medical history significant for diabetes mellitus II poorly controlled A1c of 11 in October 2024, and diabetic gastroparesis, history of cocaine abuse, migraine, coronary artery disease s/p MI in 2022 s/p PCI and DES in the RCA and another stent in the proximal/mid LAD with [facility] cardiology was admitted on July 22, 2023 with complaints of chest pain and found to have STEMI in the inferior lateral leads with a code STEMI activated and patient emergently taken to Cath Lab on 07/20/2023 patient had left heart cath showing: 1.  Patent LAD and RCA stents with mild in-stent restenosis.  Chronically occluded first and second diagonal.  80 to 90% hazy stenosis in the distal left circumflex at the bifurcation of an OM branch seems to be the culprit for inferior/inferolateral ST elevation myocardial infarction. 2.  Mildly to moderately reduced LV systolic function with moderately elevated left ventricular end-diastolic pressure. 3.  Successful complex bifurcation angioplasty utilizing the reverse mini crush technique to the distal left circumflex/OM bifurcation.  Initially, a stent was placed in the distal left circumflex but there was loss of flow in the OM branch and thus I elected to treat given that the patient was highly symptomatic. Patient was discharged on the 19th with continuation of aspirin and Brilinta for 12 months.  Patient was also found to have reduced ejection fraction heart failure with grade 1 diastolic dysfunction attributed to her recent STEMI with plans to continue beta-blocker and ARB therapy.  Education and guidance on lifestyle modification and tobacco cessation. Patient returned to the emergency  room on 23 July 2023 again with midsternal chest pain associated with nausea radiation to the left arm patient was found to have an NSTEMI in the anterior leads patient again was taken emergently to the Cath Lab because of the high risk of stent thrombosis restenosis and history of recent STEMI patient wasfound to have subacute stent thrombosis in the distal left circumflex with patent stent in the OM branch. Stents in the LAD and RCA were patent. It showed mildly reduced left ventricular systolic function with mildly elevated left ventricular end-diastolic pressure. IVUS was performed which showed stent mal-apposition proximally with no evidence of edge dissection. This was treated with a noncompliant balloon to high pressure with resolution of thrombus and excellent angiographic appearance.  Patient was discharged with plan of continuing patient on Brilinta high-dose statin therapy beta-blocker therapy along with aspirin.  Sublingual nitroglycerin and cardiology follow-up. In emergency room vitals trend shows:   Assessment and Plan: Non-ST elevated myocardial infarction  S/p nitroglycerin IV infusion, chest pain resolved, nitro infusion was stopped Continue heparin IV infusion Discontinued Brilinta, platelet function assay was ordered Started Cardizem 30 mg Q6 hourly for vasospasm and held Toprol-XL. Continue aspirin 81 mg p.o. daily and Effient loaded with 60 mg, followed by 10 mg p.o. daily Continue Lipitor 80 mg p.o. daily Cardiology consult appreciated     Uncontrolled type 2 diabetes mellitus with hyperglycemia  Glycemic protocol.  Carbohydrate consistent cardiac diet but current.     Essential hypertension Cardizem 30 mg p.o. every 6 hourly S/p nitro drip BP well-controlled now, continue to monitor  Polysubstance abuse UDS positive for [facility] Drug abuse abstinence  counseling done.  Nicotine dependence, patient smokes cigarettes.  Smoking cessation counseling done.   Body mass  index is 33.31 kg/m.  Interventions:   Diet: CLD  DVT Prophylaxis: Therapeutic Anticoagulation with heparin IV infusion    Advance goals of care discussion: Full code  Family Communication: family was not present at bedside, at the time of interview.  The pt provided permission to discuss medical plan with the family. Opportunity was given to ask question and all questions were answered satisfactorily.   Disposition:  Pt is from Home, admitted with NSTEMI, still hep gtt, which precludes a safe discharge. Discharge to Home, when cleared by cardiology.  Subjective: No significant events overnight, currently patient is chest pain-free, denied any palpitations, no shortness of breath.  Physical Exam: General: NAD, lying comfortably Appear in no distress, affect appropriate Eyes: PERRLA ENT: Oral Mucosa Clear, moist  Neck: no JVD,  Cardiovascular: S1 and S2 Present, no Murmur,  Respiratory: good respiratory effort, Bilateral Air entry equal and Decreased, no Crackles, no wheezes Abdomen: Bowel Sound present, Soft and no tenderness,  Skin: no rashes Extremities: no Pedal edema, no calf tenderness Neurologic: without any new focal findings Gait not checked due to patient safety concerns  Vitals:   07/30/23 1400 07/30/23 1418 07/30/23 1500 07/30/23 1600  BP: 118/75  115/68 110/63  Pulse: 90 81 84 84  Resp: 18 19 16 18   Temp:    97.8 F (36.6 C)  TempSrc:    Axillary  SpO2: 99% 98% 99% 99%  Weight:      Height:        Intake/Output Summary (Last 24 hours) at 07/30/2023 1707 Last data filed at 07/30/2023 1000 Gross per 24 hour  Intake 635.33 ml  Output 300 ml  Net 335.33 ml   Filed Weights   07/29/23 1639 07/30/23 0142  Weight: 104.3 kg 111.4 kg    Data Reviewed: I have personally reviewed and interpreted daily labs, tele strips, imagings as discussed above. I reviewed all nursing notes, pharmacy notes, vitals, pertinent old records I have discussed plan of care as  described above with RN and patient/family.  CBC: Recent Labs  Lab 07/23/23 2022 07/24/23 0230 07/29/23 1640 07/30/23 0209  WBC 12.0* 10.4 12.6* 11.2*  NEUTROABS 9.1*  --  7.8*  --   HGB 11.8* 10.3* 11.4* 10.1*  HCT 38.1 34.3* 36.6 33.2*  MCV 74.1* 74.6* 72.6* 73.1*  PLT 394 356 431* 349   Basic Metabolic Panel: Recent Labs  Lab 07/23/23 2022 07/24/23 0230 07/29/23 1640 07/30/23 0209  NA 132* 131* 130* 132*  K 5.0 3.9 4.0 3.7  CL 101 102 97* 100  CO2 22 20* 23 24  GLUCOSE 326* 409* 339* 250*  BUN 16 16 11 11   CREATININE 0.61  0.57 0.52 0.57 0.49  CALCIUM 8.4* 8.0* 8.7* 8.1*    Studies: US ABDOMEN LIMITED RUQ (LIVER/GB)  Result Date: 07/30/2023 CLINICAL DATA:  42 year old female with history of chest pain. EXAM: ULTRASOUND ABDOMEN LIMITED RIGHT UPPER QUADRANT COMPARISON:  Abdominal ultrasound 08/26/2011. FINDINGS: Gallbladder: Gallbladder is not distended. There are some tiny echogenic foci lying dependently with posterior acoustic shadowing, compatible with gallstones, measuring up to 6 mm. Gallbladder wall thickness is normal at 1.8 mm. No pericholecystic fluid. No sonographic Murphy's sign. Common bile duct: Diameter: 2.4 mm Liver: No focal lesion identified. Diffusely increased parenchymal echogenicity. Portal vein is patent on color Doppler imaging with normal direction of blood flow towards the liver. Other: None. IMPRESSION: 1. No acute  findings to account for the patient's symptoms. 2. Cholelithiasis without evidence of acute cholecystitis. 3. Diffusely increased hepatic echogenicity, indicative of a background of hepatic steatosis. Electronically Signed   By: Trudie Reed M.D.   On: 07/30/2023 05:20   CT Angio Chest/Abd/Pel for Dissection W and/or Wo Contrast  Result Date: 07/29/2023 CLINICAL DATA:  Severe chest pain EXAM: CT ANGIOGRAPHY CHEST, ABDOMEN AND PELVIS TECHNIQUE: Non-contrast CT of the chest was initially obtained. Multidetector CT imaging through the  chest, abdomen and pelvis was performed using the standard protocol during bolus administration of intravenous contrast. Multiplanar reconstructed images and MIPs were obtained and reviewed to evaluate the vascular anatomy. RADIATION DOSE REDUCTION: This exam was performed according to the departmental dose-optimization program which includes automated exposure control, adjustment of the mA and/or kV according to patient size and/or use of iterative reconstruction technique. CONTRAST:  OMNIPAQUE IOHEXOL 350 MG/ML SOLN COMPARISON:  Chest CT 02/19/2023, CT abdomen pelvis 12/30/2021 FINDINGS: CTA CHEST FINDINGS Cardiovascular: Non contrasted images of the chest demonstrate no acute intramural hematoma. Negative for aortic aneurysm or dissection. Cardiac size is normal. Coronary calcifications and stent. No pericardial effusion Mediastinum/Nodes: Midline trachea. No thyroid mass. No suspicious lymph nodes. Esophagus within normal limits. Increased fluid density structure in the right cardio phrenic fat, measuring 2.8 by 1.9 cm. Lungs/Pleura: Lungs are clear. No pleural effusion or pneumothorax. Musculoskeletal: No chest wall abnormality. No acute or significant osseous findings. Review of the MIP images confirms the above findings. CTA ABDOMEN AND PELVIS FINDINGS VASCULAR Aorta: Normal caliber aorta without aneurysm, dissection, vasculitis or significant stenosis. Mild atherosclerosis Celiac: Patent without evidence of aneurysm, dissection, vasculitis or significant stenosis. SMA: Patent without evidence of aneurysm, dissection, vasculitis or significant stenosis. Renals: Both renal arteries are patent without evidence of aneurysm, dissection, vasculitis, fibromuscular dysplasia or significant stenosis. IMA: Patent without evidence of aneurysm, dissection, vasculitis or significant stenosis. Inflow: Patent without evidence of aneurysm, dissection, vasculitis or significant stenosis. Veins: Suboptimally evaluated.  Review of the MIP images confirms the above findings. NON-VASCULAR Hepatobiliary: Small gallstones. No focal hepatic abnormality or biliary dilatation. Pancreas: Unremarkable. No pancreatic ductal dilatation or surrounding inflammatory changes. Spleen: Normal in size without focal abnormality. Adrenals/Urinary Tract: Adrenal glands are unremarkable. Kidneys are normal, without renal calculi, focal lesion, or hydronephrosis. Bladder is unremarkable. Stomach/Bowel: Stomach is within normal limits. Appendix appears normal. No evidence of bowel wall thickening, distention, or inflammatory changes. Lymphatic: No suspicious lymph nodes. Reproductive: Uterus and bilateral adnexa are unremarkable. Other: Negative for pelvic effusion or free air Musculoskeletal: No acute osseous abnormality Review of the MIP images confirms the above findings. IMPRESSION: 1. Negative for aortic aneurysm or dissection. 2. No CT evidence for acute intrathoracic, abdominal, or pelvic abnormality. 3. Cholelithiasis. 4. Increased fluid density structure in the right cardio phrenic fat, measuring 2.8 x 1.9 cm, possible enlarging pericardial cyst. Attention on follow-up chest CT. Electronically Signed   By: Jasmine Pang M.D.   On: 07/29/2023 21:33    Scheduled Meds:  aspirin EC  81 mg Oral Daily   atorvastatin  80 mg Oral Daily   Chlorhexidine Gluconate Cloth  6 each Topical Daily   diltiazem  30 mg Oral Q6H   [START ON 07/31/2023] influenza vac split trivalent PF  0.5 mL Intramuscular Tomorrow-1000   insulin aspart  0-9 Units Subcutaneous Q4H   nicotine  21 mg Transdermal Daily   [START ON 07/31/2023] prasugrel  10 mg Oral Daily   sodium chloride flush  3 mL Intravenous Q12H  Continuous Infusions:  heparin 1,600 Units/hr (07/30/23 1629)   lactated ringers 40 mL/hr at 07/29/23 2307   PRN Meds: acetaminophen **OR** acetaminophen, fentaNYL (SUBLIMAZE) injection, HYDROcodone-acetaminophen, mouth rinse  Time spent: 55  minutes  Author: Gillis Santa. MD [facility] 07/30/2023 5:07 PM  To reach On-call, see care teams to locate the attending and reach out to them via [email or web address]. If 7PM-7AM, please contact night-coverage If you still have difficulty reaching the attending provider, please page the [facility] (Director on Call) for [facility] on amion for assistance.

## 2023-07-30 NOTE — Progress Notes (Signed)
0115 Dr Jerilee Hoh notified that the nitroglycerin has not reileved her chest pain/ That the only thing that has helped with her chest pain is the IV fentanyl. Patient arrived from the ED to the ICU not on the Nitrogylcerin drip...BP 106/70. Nitroglycerin drip not started due to patients BP and chest pain resolution with the IV Fentanyl. Dr Helyn Numbers is aware.   [phone] Dr Jerilee Hoh made aware of Pts troponin of 655 and that the patient is pain free. No new orders at this time.

## 2023-07-30 NOTE — Progress Notes (Signed)
PHARMACY - ANTICOAGULATION CONSULT NOTE  Pharmacy Consult for heparin drip Indication: chest pain/ACS  Allergies  Allergen Reactions   Quinolones Other (See Comments)   Patient Measurements: Height: 6' (182.9 cm) Weight: 111.4 kg (245 lb 9.5 oz) IBW/kg (Calculated) : 73.1 Heparin Dosing Weight: 95.3 kg  Vital Signs: Temp: 98.5 F (36.9 C) (10/29 0800) BP: 118/75 (10/29 1400) Pulse Rate: 81 (10/29 1418)  Labs: Recent Labs    07/29/23 1640 07/29/23 1833 07/30/23 0009 07/30/23 0209 07/30/23 0831 07/30/23 1419  HGB 11.4*  --   --  10.1*  --   --   HCT 36.6  --   --  33.2*  --   --   PLT 431*  --   --  349  --   --   APTT 31  --   --   --   --   --   LABPROT 13.4  --   --   --   --   --   INR 1.0  --   --   --   --   --   HEPARINUNFRC  --   --   --  0.22* 0.34 0.24*  CREATININE 0.57  --   --  0.49  --   --   TROPONINIHS 817* 789* 622* 655*  --   --    Estimated Creatinine Clearance: 125.2 mL/min (by C-G formula based on SCr of 0.49 mg/dL).  Medical History: Past Medical History:  Diagnosis Date   Acute ST elevation myocardial infarction (STEMI) involving other coronary artery of inferior wall (HCC) 07/21/2023   Barrett esophagus    Diabetes mellitus without complication (HCC)    Gastroparesis    Migraines    MRSA (methicillin resistant staph aureus) culture positive    2009   Obesity    ST elevation myocardial infarction (STEMI) (HCC) 05/02/2021   Type 2 diabetes mellitus (HCC) 09/06/2016   Last Assessment & Plan:    Ordered new glucometer and test strips  Last Assessment & Plan:    Ordered new glucometer and test strips     Medications:  Facility-Administered Medications Prior to Admission  Medication Dose Route Frequency Provider Last Rate Last Admin   nitroGLYCERIN (NITROSTAT) SL tablet 0.4 mg  0.4 mg Sublingual Q5 min PRN Furth, Cadence H, PA-C   0.4 mg at 11/21/21 1025   Medications Prior to Admission  Medication Sig Dispense Refill Last Dose    aspirin EC 81 MG EC tablet Take 1 tablet (81 mg total) by mouth daily. Swallow whole. 30 tablet 11    atorvastatin (LIPITOR) 80 MG tablet Take 1 tablet (80 mg total) by mouth daily. 90 tablet 1    escitalopram (LEXAPRO) 10 MG tablet Take 1 tablet (10 mg total) by mouth daily. 30 tablet 2    insulin aspart (NOVOLOG) 100 UNIT/ML injection Inject 0-9 Units into the skin 3 (three) times daily with meals. 10 mL 11    metoprolol succinate (TOPROL-XL) 100 MG 24 hr tablet Take 1 tablet (100 mg total) by mouth daily. Take with or immediately following a meal. PLEASE SCHEDULE OFFICE VISIT FOR FURTHER REFILLS. THANK YOU! 30 tablet 2    pantoprazole (PROTONIX) 40 MG tablet Take 1 tablet (40 mg total) by mouth daily. PLEASE SCHEDULE OFFICE VISIT FOR FURTHER REFILLS. THANK YOU! 30 tablet 1    promethazine (PHENERGAN) 25 MG suppository Place 1 suppository (25 mg total) rectally every 8 (eight) hours as needed for refractory nausea / vomiting. 12 each  0    ticagrelor (BRILINTA) 90 MG TABS tablet Take 1 tablet (90 mg total) by mouth 2 (two) times daily. 60 tablet 11    Scheduled:   aspirin EC  81 mg Oral Daily   atorvastatin  80 mg Oral Daily   Chlorhexidine Gluconate Cloth  6 each Topical Daily   diltiazem  30 mg Oral Q6H   [START ON 07/31/2023] influenza vac split trivalent PF  0.5 mL Intramuscular Tomorrow-1000   insulin aspart  0-9 Units Subcutaneous Q4H   nicotine  21 mg Transdermal Daily   [START ON 07/31/2023] prasugrel  10 mg Oral Daily   sodium chloride flush  3 mL Intravenous Q12H   Infusions:   heparin 1,450 Units/hr (07/30/23 1139)   lactated ringers 40 mL/hr at 07/29/23 2307   Assessment: 42 yo F to start heparin drip for ACS/STEMI.  HX DM2, gastroparesis, cocaine abuse. Admitted w/ CP, recent STEMI + stents, recently discharged 07/24/23. On Ticagrelor PTA Hgb 11.4  Plt 431   INR 1.0  aPTT 31  Goal of Therapy:  Heparin level 0.3-0.7 units/ml Monitor platelets by anticoagulation protocol:  Yes  10/29 0209 HL 0.22, subtherapeutic 10/29 0831 HL 0.34, therapeutic x 1  10/29 1419 HL 0.24, subtherapeutic    Plan:  HL 0.24 - subtherapeutic  Give 1400 unit bolus x 1  Increase heparin infusion to 1600 units/hr  Check HL 6 hours after rate change  CBC daily while on heparin  Littie Deeds, PharmD Pharmacy Resident  07/30/2023 3:26 PM

## 2023-07-30 NOTE — Progress Notes (Signed)
PHARMACY - ANTICOAGULATION CONSULT NOTE  Pharmacy Consult for heparin drip Indication: chest pain/ACS  Allergies  Allergen Reactions   Quinolones Other (See Comments)   Patient Measurements: Height: 6' (182.9 cm) Weight: 111.4 kg (245 lb 9.5 oz) IBW/kg (Calculated) : 73.1 Heparin Dosing Weight: 95.3 kg  Vital Signs: Temp: 98.5 F (36.9 C) (10/29 0800) Temp Source: Oral (10/29 0107) BP: 117/78 (10/29 0930) Pulse Rate: 69 (10/29 0900)  Labs: Recent Labs    07/29/23 1640 07/29/23 1833 07/30/23 0009 07/30/23 0209 07/30/23 0831  HGB 11.4*  --   --  10.1*  --   HCT 36.6  --   --  33.2*  --   PLT 431*  --   --  349  --   APTT 31  --   --   --   --   LABPROT 13.4  --   --   --   --   INR 1.0  --   --   --   --   HEPARINUNFRC  --   --   --  0.22* 0.34  CREATININE 0.57  --   --  0.49  --   TROPONINIHS 817* 789* 622* 655*  --    Estimated Creatinine Clearance: 125.2 mL/min (by C-G formula based on SCr of 0.49 mg/dL).  Medical History: Past Medical History:  Diagnosis Date   Acute ST elevation myocardial infarction (STEMI) involving other coronary artery of inferior wall (HCC) 07/21/2023   Barrett esophagus    Diabetes mellitus without complication (HCC)    Gastroparesis    Migraines    MRSA (methicillin resistant staph aureus) culture positive    2009   Obesity    ST elevation myocardial infarction (STEMI) (HCC) 05/02/2021   Type 2 diabetes mellitus (HCC) 09/06/2016   Last Assessment & Plan:    Ordered new glucometer and test strips  Last Assessment & Plan:    Ordered new glucometer and test strips     Medications:  Facility-Administered Medications Prior to Admission  Medication Dose Route Frequency Provider Last Rate Last Admin   nitroGLYCERIN (NITROSTAT) SL tablet 0.4 mg  0.4 mg Sublingual Q5 min PRN Furth, Cadence H, PA-C   0.4 mg at 11/21/21 1025   Medications Prior to Admission  Medication Sig Dispense Refill Last Dose   aspirin EC 81 MG EC tablet Take 1  tablet (81 mg total) by mouth daily. Swallow whole. 30 tablet 11    atorvastatin (LIPITOR) 80 MG tablet Take 1 tablet (80 mg total) by mouth daily. 90 tablet 1    escitalopram (LEXAPRO) 10 MG tablet Take 1 tablet (10 mg total) by mouth daily. 30 tablet 2    insulin aspart (NOVOLOG) 100 UNIT/ML injection Inject 0-9 Units into the skin 3 (three) times daily with meals. 10 mL 11    metoprolol succinate (TOPROL-XL) 100 MG 24 hr tablet Take 1 tablet (100 mg total) by mouth daily. Take with or immediately following a meal. PLEASE SCHEDULE OFFICE VISIT FOR FURTHER REFILLS. THANK YOU! 30 tablet 2    pantoprazole (PROTONIX) 40 MG tablet Take 1 tablet (40 mg total) by mouth daily. PLEASE SCHEDULE OFFICE VISIT FOR FURTHER REFILLS. THANK YOU! 30 tablet 1    promethazine (PHENERGAN) 25 MG suppository Place 1 suppository (25 mg total) rectally every 8 (eight) hours as needed for refractory nausea / vomiting. 12 each 0    ticagrelor (BRILINTA) 90 MG TABS tablet Take 1 tablet (90 mg total) by mouth 2 (two) times daily.  60 tablet 11    Scheduled:   aspirin EC  81 mg Oral Daily   atorvastatin  80 mg Oral Daily   Chlorhexidine Gluconate Cloth  6 each Topical Daily   diltiazem  30 mg Oral Q6H   [START ON 07/31/2023] influenza vac split trivalent PF  0.5 mL Intramuscular Tomorrow-1000   insulin aspart  0-9 Units Subcutaneous Q4H   nicotine  21 mg Transdermal Daily   [START ON 07/31/2023] prasugrel  10 mg Oral Daily   sodium chloride flush  3 mL Intravenous Q12H   Infusions:   heparin 1,450 Units/hr (07/30/23 0304)   lactated ringers 40 mL/hr at 07/29/23 2307   nitroGLYCERIN Stopped (07/29/23 2119)   Assessment: 42 yo F to start heparin drip for ACS/STEMI.  HX DM2, gastroparesis, cocaine abuse. Admitted w/ CP, recent STEMI + stents, recently discharged 07/24/23. On Ticagrelor PTA Hgb 11.4  Plt 431   INR 1.0  aPTT 31  Goal of Therapy:  Heparin level 0.3-0.7 units/ml Monitor platelets by anticoagulation  protocol: Yes  10/29 0209 HL 0.22, subtherapeutic 10/29 0831 HL 0.34, therapeutic x 1    Plan:  HL therapeutic at 0.34 - continue heparin infusion at 1450 units/hr Recheck HL at 1430 to confirm rate  CBC daily while on heparin  Littie Deeds, PharmD Pharmacy Resident  07/30/2023 10:31 AM

## 2023-07-30 NOTE — Consult Note (Signed)
Cardiology Consultation   Patient ID: NAKHIA HAMBURGER MRN: [id number]; DOB: Aug 31, 1981  Admit date: 07/29/2023 Date of Consult: 07/30/2023  PCP:  Dan Humphreys, MD   [facility] Providers Cardiologist:  None      [facility]  Patient Profile:   Karen Ruiz is a 42 y.o. female with a hx of coronary artery disease with recent.  She elevation myocardial infarction, diabetes, gastroparesis, hypertension, hyperlipidemia, tobacco use, previous cocaine use, who is being seen 07/30/2023 for the evaluation of NSTEMI at the request of Dr. Rosalia Hammers.  History of Present Illness:   Ms. Beardmore has an extensive cardiac history with previous myocardial infarction in 2022.  She was treated with drug-eluting stent placement to the RCA at that time.  She had been following with [facility] cardiology and underwent stenting of the proximal/mid LAD.  She was hospitalized last in June of this year with elevated troponin thought to be due to myocarditis.  Repeat cardiac catheterization showed patent stents.  She was hospitalized in August with a lateral ST elevated myocardial infarction.  Cardiac catheterization showed an occluded diagonal branch which was felt to be the culprit.  She was admitted to [facility] 07/20/23 with an inferior lateral LDL elevation myocardial infarction.  Emergent cardiac catheterization showed severe stenosis in the distal left circumflex at the bifurcation of OM branch reduction of the distal left circumflex and OM branch.  The initial plan was to place a stent in the main artery but there was loss of flow in the OM branch necessitating stent placement using a reverse and mini crush technique.  Echocardiogram revealed normal LV systolic function.  Labs showed an LDL of 165 in spite of being on statins.  She reports poor tolerance to oral medications overall due to gastroparesis.  In addition LP(a) came back elevated at 166.  She was discharged home on 07/22/2023.  She  presented back to [facility] on 07/23/2023 for the evaluation of chest pain that started 15 to 30 minutes prior to arrival.  The pain was similar to a recent myocardial infarction.  EKG showed the same EKG changes at the inferior and inferior ST elevation.  She was given heparin and emergent cardiac catheterization was recommended.  It showed nonocclusive thrombus in the distal left circumflex stent with patent stent to the OM branch.  There was evidence of thrombus.  IVUS was performed which showed stent malabsorption proximally with no evidence of edge dissection.  This was treated with noncompliant balloon to high pressure with resolution of thrombus and excellent angiographic appearance.  The patient was given a bolus of Aggrastat and was recommended to continue on dual antiplatelet therapy with aspirin and try to get more.  Given her history of severe gastroparesis she is concerned about absorption of oral medications including the antiplatelet medications.  Especially with her previously elevated LP(a) it was she was noted to be at risk for stent thrombosis and was to be started on a PCSK9 inhibitor before hospital discharge.  Her urine drug screen was also positive for amphetamine.  It was thought to be one of the causing factors other than vasoconstriction.  She was considered stable for discharge on 07/24/2023.  She presented back to the [facility] emergency department 10/29/2024Via EMS from home.  She was walking in the store and began to have severe chest pain that radiated down her left arm.  EMS arrived and that given her for 81 mg aspirin and 1 sublingual nitroglycerin with no relief. She continues  to have complaints of chest pain that she rated as severe and described as pressure in the center of her chest radiating down her left arm.  She was acutely uncomfortable on exam in the emergency department.  Had some nausea but denied any associated shortness of breath.  States that she has been compliant with her  current medications.  Also denies any recent drug use. Blood pressure was noted to be mostly controlled at 158/135, pulse 102, respirations 29, temperature 98.2.  Pertinent labs revealed a sodium of 130, chloride 97, blood glucose of 339, calcium 8.7, hemoglobin 11.4, high-sensitivity troponin of 817 and 789.  Medications administered in the emergency department with nitroglycerin drip, heparin bolus and heparin infusion, fentanyl 50 mcg IVP x 2 doses, Zofran 4 mg IVP, Reglan 10 mg IVP, and Nitrostat 0.4 mg sublingual.  Imaging: CTA chest/abdomen/pelvis revealed no evidence of aortic aneurysm or dissection, no CT evidence of acute intrathoracic abdominal pelvic abnormality, increased fluid density structure in the right cardiophrenic fat possible enlargement pericardial cyst follow-up with chest CT; ultrasound of right upper quadrant of the abdomen showed no acute findings to account for patient's symptoms, cholelithiasis without evidence of acute cholecystitis, diffusely increased hepatic echogenicity indicative of a background of hepatic steatosis.   Past Medical History:  Diagnosis Date   Acute ST elevation myocardial infarction (STEMI) involving other coronary artery of inferior wall (HCC) 07/21/2023   Barrett esophagus    Diabetes mellitus without complication (HCC)    Gastroparesis    Migraines    MRSA (methicillin resistant staph aureus) culture positive    2009   Obesity    ST elevation myocardial infarction (STEMI) (HCC) 05/02/2021   Type 2 diabetes mellitus (HCC) 09/06/2016   Last Assessment & Plan:    Ordered new glucometer and test strips  Last Assessment & Plan:    Ordered new glucometer and test strips      Past Surgical History:  Procedure Laterality Date   CARDIAC CATHETERIZATION     CESAREAN SECTION     CORONARY STENT INTERVENTION N/A 05/02/2021   Procedure: CORONARY STENT INTERVENTION;  Surgeon: Yvonne Kendall, MD;  Location: [facility] INVASIVE CV LAB;  Service:  Cardiovascular;  Laterality: N/A;   CORONARY ULTRASOUND/IVUS N/A 05/02/2021   Procedure: Intravascular Ultrasound/IVUS;  Surgeon: Yvonne Kendall, MD;  Location: [facility] INVASIVE CV LAB;  Service: Cardiovascular;  Laterality: N/A;   CORONARY ULTRASOUND/IVUS N/A 07/23/2023   Procedure: Coronary Ultrasound/IVUS;  Surgeon: Iran Ouch, MD;  Location: [facility] INVASIVE CV LAB;  Service: Cardiovascular;  Laterality: N/A;   CORONARY/GRAFT ACUTE MI REVASCULARIZATION N/A 07/20/2023   Procedure: Coronary/Graft Acute MI Revascularization;  Surgeon: Iran Ouch, MD;  Location: [facility] INVASIVE CV LAB;  Service: Cardiovascular;  Laterality: N/A;   CORONARY/GRAFT ACUTE MI REVASCULARIZATION N/A 07/23/2023   Procedure: Coronary/Graft Acute MI Revascularization;  Surgeon: Iran Ouch, MD;  Location: [facility] INVASIVE CV LAB;  Service: Cardiovascular;  Laterality: N/A;   ESOPHAGOGASTRODUODENOSCOPY N/A 07/28/2019   Procedure: ESOPHAGOGASTRODUODENOSCOPY (EGD);  Surgeon: Toney Reil, MD;  Location: [facility] ENDOSCOPY;  Service: Gastroenterology;  Laterality: N/A;   LEFT HEART CATH AND CORONARY ANGIOGRAPHY N/A 05/02/2021   Procedure: LEFT HEART CATH AND CORONARY ANGIOGRAPHY;  Surgeon: Yvonne Kendall, MD;  Location: [facility] INVASIVE CV LAB;  Service: Cardiovascular;  Laterality: N/A;   LEFT HEART CATH AND CORONARY ANGIOGRAPHY N/A 07/20/2023   Procedure: LEFT HEART CATH AND CORONARY ANGIOGRAPHY;  Surgeon: Iran Ouch, MD;  Location: [facility] INVASIVE CV LAB;  Service: Cardiovascular;  Laterality: N/A;  LEFT HEART CATH AND CORONARY ANGIOGRAPHY N/A 07/23/2023   Procedure: LEFT HEART CATH AND CORONARY ANGIOGRAPHY;  Surgeon: Iran Ouch, MD;  Location: [facility] INVASIVE CV LAB;  Service: Cardiovascular;  Laterality: N/A;     Home Medications:  Prior to Admission medications   Medication Sig Start Date End Date Taking? Authorizing Provider  aspirin EC 81 MG EC tablet Take 1 tablet (81 mg total) by mouth  daily. Swallow whole. 05/04/21   Delfino Lovett, MD  atorvastatin (LIPITOR) 80 MG tablet Take 1 tablet (80 mg total) by mouth daily. 07/22/23 10/20/23  Arnetha Courser, MD  escitalopram (LEXAPRO) 10 MG tablet Take 1 tablet (10 mg total) by mouth daily. 07/22/23 10/20/23  Arnetha Courser, MD  insulin aspart (NOVOLOG) 100 UNIT/ML injection Inject 0-9 Units into the skin 3 (three) times daily with meals. 07/16/19   Katha Hamming, MD  metoprolol succinate (TOPROL-XL) 100 MG 24 hr tablet Take 1 tablet (100 mg total) by mouth daily. Take with or immediately following a meal. PLEASE SCHEDULE OFFICE VISIT FOR FURTHER REFILLS. THANK YOU! 07/22/23 10/20/23  Arnetha Courser, MD  pantoprazole (PROTONIX) 40 MG tablet Take 1 tablet (40 mg total) by mouth daily. PLEASE SCHEDULE OFFICE VISIT FOR FURTHER REFILLS. THANK YOU! 07/22/23 10/20/23  Arnetha Courser, MD  promethazine (PHENERGAN) 25 MG suppository Place 1 suppository (25 mg total) rectally every 8 (eight) hours as needed for refractory nausea / vomiting. 06/29/21   Shaune Pollack, MD  ticagrelor (BRILINTA) 90 MG TABS tablet Take 1 tablet (90 mg total) by mouth 2 (two) times daily. 07/22/23   Arnetha Courser, MD    Inpatient Medications: Scheduled Meds:  aspirin EC  81 mg Oral Daily   atorvastatin  80 mg Oral Daily   Chlorhexidine Gluconate Cloth  6 each Topical Daily   diltiazem  30 mg Oral Q6H   [START ON 07/31/2023] influenza vac split trivalent PF  0.5 mL Intramuscular Tomorrow-1000   insulin aspart  0-9 Units Subcutaneous Q4H   nicotine  21 mg Transdermal Daily   [START ON 07/31/2023] prasugrel  10 mg Oral Daily   sodium chloride flush  3 mL Intravenous Q12H   Continuous Infusions:  heparin 1,450 Units/hr (07/30/23 0304)   lactated ringers 40 mL/hr at 07/29/23 2307   PRN Meds: acetaminophen **OR** acetaminophen, fentaNYL (SUBLIMAZE) injection, HYDROcodone-acetaminophen, mouth rinse  Allergies:    Allergies  Allergen Reactions   Quinolones Other (See  Comments)    Social History:   Social History   Socioeconomic History   Marital status: Single    Spouse name: Not on file   Number of children: Not on file   Years of education: Not on file   Highest education level: Not on file  Occupational History   Not on file  Tobacco Use   Smoking status: Some Days    Current packs/day: 0.50    Types: Cigarettes   Smokeless tobacco: Never  Vaping Use   Vaping status: Never Used  Substance and Sexual Activity   Alcohol use: Yes   Drug use: Yes    Types: Marijuana   Sexual activity: Not on file  Other Topics Concern   Not on file  Social History Narrative   Not on file   Social Determinants of Health   Financial Resource Strain: Low Risk  (05/09/2023)   Received from [facility]   Overall Financial Resource Strain (CARDIA)    Difficulty of Paying Living Expenses: Not very hard  Food Insecurity: No Food Insecurity (07/30/2023)  Hunger Vital Sign    Worried About Running Out of Food in the Last Year: Never true    Ran Out of Food in the Last Year: Never true  Transportation Needs: No Transportation Needs (07/30/2023)   PRAPARE - Administrator, Civil Service (Medical): No    Lack of Transportation (Non-Medical): No  Physical Activity: Not on file  Stress: Not on file  Social Connections: Not on file  Intimate Partner Violence: Not At Risk (07/30/2023)   Humiliation, Afraid, Rape, and Kick questionnaire    Fear of Current or Ex-Partner: No    Emotionally Abused: No    Physically Abused: No    Sexually Abused: No    Family History:    Family History  Problem Relation Age of Onset   Diabetes Mother    Cancer Mother    Heart failure Mother    Hypertension Mother    Heart attack Father    Diabetes Father    Hypertension Father      ROS:  Please see the history of present illness.  Review of Systems  Cardiovascular:  Positive for chest pain.  Gastrointestinal:  Positive for nausea.    All other ROS  reviewed and negative.     Physical Exam/Data:   Vitals:   07/30/23 0800 07/30/23 0900 07/30/23 0930 07/30/23 1000  BP: 119/77 120/89 117/78 116/69  Pulse: 79 69  82  Resp: 14 19  (!) 21  Temp: 98.5 F (36.9 C)     TempSrc:      SpO2: 99% 95%  98%  Weight:      Height:        Intake/Output Summary (Last 24 hours) at 07/30/2023 1054 Last data filed at 07/30/2023 1000 Gross per 24 hour  Intake 435.33 ml  Output 300 ml  Net 135.33 ml      07/30/2023    1:42 AM 07/29/2023    4:39 PM 07/23/2023    8:25 PM  Last 3 Weights  Weight (lbs) 245 lb 9.5 oz 230 lb 238 lb 1.6 oz  Weight (kg) 111.4 kg 104.327 kg 108 kg     Body mass index is 33.31 kg/m.  General:  Well nourished, well developed, in no acute distress HEENT: normal Neck: no JVD Vascular: No carotid bruits; Distal pulses 2+ bilaterally Cardiac:  normal S1, S2; RRR; no murmur  Lungs:  clear to auscultation bilaterally, no wheezing, rhonchi or rales  Abd: soft, nontender, no hepatomegaly  Ext: no edema Musculoskeletal:  No deformities, BUE and BLE strength normal and equal Skin: warm and dry  Neuro:  CNs 2-12 intact, no focal abnormalities noted Psych:  Normal affect   EKG:  The EKG was personally reviewed and demonstrates: Sinus tach with a rate of 101, old lateral infarct noted Telemetry:  Telemetry was personally reviewed and demonstrates: Sinus rhythm  Relevant CV Studies: [facility] 07/23/23   Mid LAD lesion is 30% stenosed.   Prox RCA to Mid RCA lesion is 30% stenosed.   1st Diag lesion is 100% stenosed.   2nd Diag lesion is 100% stenosed.   RPDA lesion is 50% stenosed.   Dist Cx lesion is 50% stenosed.   Non-stenotic Ost LAD to Mid LAD lesion was previously treated.   Non-stenotic 3rd Mrg lesion was previously treated.   Balloon angioplasty was performed using a BALLN [place] TREK NEO RX 3.0X12.   Post intervention, there is a 0% residual stenosis.   There is mild left ventricular systolic dysfunction.  LV end  diastolic pressure is mildly elevated.   The left ventricular ejection fraction is 45-50% by visual estimate.   1.  Subacute stent thrombosis in the distal left circumflex with patent stent in the OM branch.  IVUS showed evidence of stent mall apposition proximally.  No evidence of edge dissection.  Patent stents in the LAD and RCA. 2.  Mildly reduced LV systolic function with mildly elevated left ventricular end-diastolic pressure. 3.  Successful balloon angioplasty to the proximal segment of the left circumflex stent using a 3.0 x 12 mm noncompliant balloon to 16 atm.   Recommendations: Continue dual antiplatelet therapy indefinitely. Aggressive treatment of risk factors and consider a PCSK9 inhibitor given elevated LDL and LP(a).  TTE 07/21/23 1. Left ventricular ejection fraction, by estimation, is 55 to 60%. The  left ventricle has normal function. The left ventricle has no regional  wall motion abnormalities. Left ventricular diastolic parameters were  normal. The average left ventricular  global longitudinal strain is -16.9 %. The global longitudinal strain is  normal.   2. Right ventricular systolic function is normal. The right ventricular  size is normal.   3. The mitral valve is normal in structure. No evidence of mitral valve  regurgitation.   4. The aortic valve was not well visualized. Aortic valve regurgitation  is not visualized.   5. The inferior vena cava is normal in size with greater than 50%  respiratory variability, suggesting right atrial pressure of 3 mmHg.   LHC 07/20/23   Prox RCA to Mid RCA lesion is 30% stenosed.   RPDA lesion is 50% stenosed.   Dist Cx lesion is 90% stenosed.   3rd Mrg lesion is 70% stenosed.   Mid LAD lesion is 30% stenosed.   1st Diag lesion is 100% stenosed.   2nd Diag lesion is 100% stenosed.   Previously placed Ost LAD to Mid LAD stent of unknown type is  widely patent.   A drug-eluting stent was successfully placed using a STENT  ONYX FRONTIER 2.5X18.   A drug-eluting stent was successfully placed using a STENT ONYX FRONTIER 2.0X15.   Post intervention, there is a 0% residual stenosis.   Post intervention, there is a 0% residual stenosis.   There is mild to moderate left ventricular systolic dysfunction.   LV end diastolic pressure is moderately elevated.   The left ventricular ejection fraction is 35-45% by visual estimate.   1.  Patent LAD and RCA stents with mild in-stent restenosis.  Chronically occluded first and second diagonal.  80 to 90% hazy stenosis in the distal left circumflex at the bifurcation of an OM branch seems to be the culprit for inferior/inferolateral ST elevation myocardial infarction. 2.  Mildly to moderately reduced LV systolic function with moderately elevated left ventricular end-diastolic pressure. 3.  Successful complex bifurcation angioplasty utilizing the reverse mini crush technique to the distal left circumflex/OM bifurcation.  Initially, a stent was placed in the distal left circumflex but there was loss of flow in the OM branch and thus I elected to treat given that the patient was highly symptomatic.   Recommendations: Dual antiplatelet therapy for at least 12 months and preferably indefinitely. Aggrastat infusion for 12 hours given risk of stent thrombosis in the setting of vomiting. Nitroglycerin drip for pain control. Check urine drug screen. Aggressive treatment of risk factors and smoking cessation.    Laboratory Data:  High Sensitivity Troponin:   Recent Labs  Lab 07/23/23 2022 07/29/23 1640 07/29/23 1833 07/30/23  0009 07/30/23 0209  TROPONINIHS 811* 817* 789* 622* 655*     Chemistry Recent Labs  Lab 07/24/23 0230 07/29/23 1640 07/30/23 0209  NA 131* 130* 132*  K 3.9 4.0 3.7  CL 102 97* 100  CO2 20* 23 24  GLUCOSE 409* 339* 250*  BUN 16 11 11   CREATININE 0.52 0.57 0.49  CALCIUM 8.0* 8.7* 8.1*  GFRNONAA >60 >60 >60  ANIONGAP 9 10 8     Recent Labs  Lab  07/29/23 1640 07/30/23 0209  PROT 7.3 6.2*  ALBUMIN 3.5 3.0*  AST 19 18  ALT 20 18  ALKPHOS 85 69  BILITOT 0.4 0.5   Lipids No results for input(s): "CHOL", "TRIG", "HDL", "LABVLDL", "LDLCALC", "CHOLHDL" in the last 168 hours.  Hematology Recent Labs  Lab 07/24/23 0230 07/29/23 1640 07/30/23 0209  WBC 10.4 12.6* 11.2*  RBC 4.60 5.04 4.54  HGB 10.3* 11.4* 10.1*  HCT 34.3* 36.6 33.2*  MCV 74.6* 72.6* 73.1*  MCH 22.4* 22.6* 22.2*  MCHC 30.0 31.1 30.4  RDW 16.5* 17.2* 17.1*  PLT 356 431* 349   Thyroid  Recent Labs  Lab 07/29/23 1833  TSH 4.787*  FREET4 0.91    BNPNo results for input(s): "BNP", "PROBNP" in the last 168 hours.  DDimer No results for input(s): "DDIMER" in the last 168 hours.   Radiology/Studies:  US ABDOMEN LIMITED RUQ (LIVER/GB)  Result Date: 07/30/2023 CLINICAL DATA:  42 year old female with history of chest pain. EXAM: ULTRASOUND ABDOMEN LIMITED RIGHT UPPER QUADRANT COMPARISON:  Abdominal ultrasound 08/26/2011. FINDINGS: Gallbladder: Gallbladder is not distended. There are some tiny echogenic foci lying dependently with posterior acoustic shadowing, compatible with gallstones, measuring up to 6 mm. Gallbladder wall thickness is normal at 1.8 mm. No pericholecystic fluid. No sonographic Murphy's sign. Common bile duct: Diameter: 2.4 mm Liver: No focal lesion identified. Diffusely increased parenchymal echogenicity. Portal vein is patent on color Doppler imaging with normal direction of blood flow towards the liver. Other: None. IMPRESSION: 1. No acute findings to account for the patient's symptoms. 2. Cholelithiasis without evidence of acute cholecystitis. 3. Diffusely increased hepatic echogenicity, indicative of a background of hepatic steatosis. Electronically Signed   By: Trudie Reed M.D.   On: 07/30/2023 05:20   CT Angio Chest/Abd/Pel for Dissection W and/or Wo Contrast  Result Date: 07/29/2023 CLINICAL DATA:  Severe chest pain EXAM: CT  ANGIOGRAPHY CHEST, ABDOMEN AND PELVIS TECHNIQUE: Non-contrast CT of the chest was initially obtained. Multidetector CT imaging through the chest, abdomen and pelvis was performed using the standard protocol during bolus administration of intravenous contrast. Multiplanar reconstructed images and MIPs were obtained and reviewed to evaluate the vascular anatomy. RADIATION DOSE REDUCTION: This exam was performed according to the departmental dose-optimization program which includes automated exposure control, adjustment of the mA and/or kV according to patient size and/or use of iterative reconstruction technique. CONTRAST:  OMNIPAQUE IOHEXOL 350 MG/ML SOLN COMPARISON:  Chest CT 02/19/2023, CT abdomen pelvis 12/30/2021 FINDINGS: CTA CHEST FINDINGS Cardiovascular: Non contrasted images of the chest demonstrate no acute intramural hematoma. Negative for aortic aneurysm or dissection. Cardiac size is normal. Coronary calcifications and stent. No pericardial effusion Mediastinum/Nodes: Midline trachea. No thyroid mass. No suspicious lymph nodes. Esophagus within normal limits. Increased fluid density structure in the right cardio phrenic fat, measuring 2.8 by 1.9 cm. Lungs/Pleura: Lungs are clear. No pleural effusion or pneumothorax. Musculoskeletal: No chest wall abnormality. No acute or significant osseous findings. Review of the MIP images confirms the above findings. CTA ABDOMEN AND  PELVIS FINDINGS VASCULAR Aorta: Normal caliber aorta without aneurysm, dissection, vasculitis or significant stenosis. Mild atherosclerosis Celiac: Patent without evidence of aneurysm, dissection, vasculitis or significant stenosis. SMA: Patent without evidence of aneurysm, dissection, vasculitis or significant stenosis. Renals: Both renal arteries are patent without evidence of aneurysm, dissection, vasculitis, fibromuscular dysplasia or significant stenosis. IMA: Patent without evidence of aneurysm, dissection, vasculitis or  significant stenosis. Inflow: Patent without evidence of aneurysm, dissection, vasculitis or significant stenosis. Veins: Suboptimally evaluated. Review of the MIP images confirms the above findings. NON-VASCULAR Hepatobiliary: Small gallstones. No focal hepatic abnormality or biliary dilatation. Pancreas: Unremarkable. No pancreatic ductal dilatation or surrounding inflammatory changes. Spleen: Normal in size without focal abnormality. Adrenals/Urinary Tract: Adrenal glands are unremarkable. Kidneys are normal, without renal calculi, focal lesion, or hydronephrosis. Bladder is unremarkable. Stomach/Bowel: Stomach is within normal limits. Appendix appears normal. No evidence of bowel wall thickening, distention, or inflammatory changes. Lymphatic: No suspicious lymph nodes. Reproductive: Uterus and bilateral adnexa are unremarkable. Other: Negative for pelvic effusion or free air Musculoskeletal: No acute osseous abnormality Review of the MIP images confirms the above findings. IMPRESSION: 1. Negative for aortic aneurysm or dissection. 2. No CT evidence for acute intrathoracic, abdominal, or pelvic abnormality. 3. Cholelithiasis. 4. Increased fluid density structure in the right cardio phrenic fat, measuring 2.8 x 1.9 cm, possible enlarging pericardial cyst. Attention on follow-up chest CT. Electronically Signed   By: Jasmine Pang M.D.   On: 07/29/2023 21:33     Assessment and Plan:   NSTEMI/CAD with recent PCI/DES -Presented with severe chest pain similar to prior events -High-sensitivity troponins trended 817, 789, 622, 655 -States that she has not missed any of her DAPT -EKG with previous changes but no acute changes noted -Started on heparin infusion that has been continued -Nitroglycerin drip has been weaned off -Pain-free on exam this morning -Platelet function assay ordered -Transition from Brilinta to prasugrel -Depending on symptoms later on this afternoon will determine if further testing  is needed -Patient can have clear liquid diet for the time being until decision is made -Continue with aspirin, prasugrel, statin therapy -Started on diltiazem 30 mg every 6 hours for vasospasm, PTA Toprol-XL on hold -LVEF on echocardiogram 55-60% -Have pharmacy check on PCSK9 inhibitor therapy -Continue with telemetry monitoring -EKG as needed for pain or changes  Hypertension -blood pressure 119/77 -continued on diltiazem 30 mg every 6 hours -vital signs per unit protocol  Hyperlipidemia -Continued on atorvastatin 80 mg daily -With elevated LP(a) recommend starting PCSK9 inhibitor -LDL 165  Uncontrolled type 2 diabetes -Continued on insulin therapy -Continued management per IM  Polysubstance abuse -Nicotine patch for smoking -Total cessation is recommended for all illicit drug use -UDS pending   Risk Assessment/Risk Scores:     TIMI Risk Score for Unstable Angina or Non-ST Elevation MI:   The patient's TIMI risk score is 5, which indicates a 26% risk of all cause mortality, new or recurrent myocardial infarction or need for urgent revascularization in the next 14 days.          For questions or updates, please contact [facility] Please consult [email or web address] for contact info under    Signed, Carlen Rebuck, NP  07/30/2023 10:54 AM

## 2023-07-30 NOTE — Progress Notes (Signed)
PHARMACY - ANTICOAGULATION CONSULT NOTE  Pharmacy Consult for heparin drip Indication: chest pain/ACS  Allergies  Allergen Reactions   Quinolones Other (See Comments)    Patient Measurements: Height: 6' (182.9 cm) Weight: 111.4 kg (245 lb 9.5 oz) IBW/kg (Calculated) : 73.1 Heparin Dosing Weight: 95.3 kg  Vital Signs: Temp: 97.9 F (36.6 C) (10/29 0107) Temp Source: Oral (10/29 0107) BP: 106/70 (10/29 0130) Pulse Rate: 84 (10/29 0130)  Labs: Recent Labs    07/29/23 1640 07/29/23 1833 07/30/23 0009 07/30/23 0209  HGB 11.4*  --   --  10.1*  HCT 36.6  --   --  33.2*  PLT 431*  --   --  349  APTT 31  --   --   --   LABPROT 13.4  --   --   --   INR 1.0  --   --   --   HEPARINUNFRC  --   --   --  0.22*  CREATININE 0.57  --   --   --   TROPONINIHS 817* 789* 622*  --     Estimated Creatinine Clearance: 125.2 mL/min (by C-G formula based on SCr of 0.57 mg/dL).   Medical History: Past Medical History:  Diagnosis Date   Acute ST elevation myocardial infarction (STEMI) involving other coronary artery of inferior wall (HCC) 07/21/2023   Barrett esophagus    Diabetes mellitus without complication (HCC)    Gastroparesis    Migraines    MRSA (methicillin resistant staph aureus) culture positive    2009   Obesity    ST elevation myocardial infarction (STEMI) (HCC) 05/02/2021   Type 2 diabetes mellitus (HCC) 09/06/2016   Last Assessment & Plan:    Ordered new glucometer and test strips  Last Assessment & Plan:    Ordered new glucometer and test strips      Medications:  Facility-Administered Medications Prior to Admission  Medication Dose Route Frequency Provider Last Rate Last Admin   nitroGLYCERIN (NITROSTAT) SL tablet 0.4 mg  0.4 mg Sublingual Q5 min PRN Furth, Cadence H, PA-C   0.4 mg at 11/21/21 1025   Medications Prior to Admission  Medication Sig Dispense Refill Last Dose   aspirin EC 81 MG EC tablet Take 1 tablet (81 mg total) by mouth daily. Swallow whole.  30 tablet 11    atorvastatin (LIPITOR) 80 MG tablet Take 1 tablet (80 mg total) by mouth daily. 90 tablet 1    escitalopram (LEXAPRO) 10 MG tablet Take 1 tablet (10 mg total) by mouth daily. 30 tablet 2    insulin aspart (NOVOLOG) 100 UNIT/ML injection Inject 0-9 Units into the skin 3 (three) times daily with meals. 10 mL 11    metoprolol succinate (TOPROL-XL) 100 MG 24 hr tablet Take 1 tablet (100 mg total) by mouth daily. Take with or immediately following a meal. PLEASE SCHEDULE OFFICE VISIT FOR FURTHER REFILLS. THANK YOU! 30 tablet 2    pantoprazole (PROTONIX) 40 MG tablet Take 1 tablet (40 mg total) by mouth daily. PLEASE SCHEDULE OFFICE VISIT FOR FURTHER REFILLS. THANK YOU! 30 tablet 1    promethazine (PHENERGAN) 25 MG suppository Place 1 suppository (25 mg total) rectally every 8 (eight) hours as needed for refractory nausea / vomiting. 12 each 0    ticagrelor (BRILINTA) 90 MG TABS tablet Take 1 tablet (90 mg total) by mouth 2 (two) times daily. 60 tablet 11    Scheduled:   aspirin EC  81 mg Oral Daily   [START  ON 07/31/2023] influenza vac split trivalent PF  0.5 mL Intramuscular Tomorrow-1000   nicotine  21 mg Transdermal Daily   sodium chloride flush  3 mL Intravenous Q12H   Infusions:   heparin 1,250 Units/hr (07/29/23 1923)   lactated ringers 40 mL/hr at 07/29/23 2307   nitroGLYCERIN Stopped (07/29/23 2119)    Assessment: 42 yo F to start heparin drip for ACS/STEMI.  HX DM2, gastroparesis, cocaine abuse. Admitted w/ CP, recent STEMI + stents, recently discharged 07/24/23. On Ticagrelor PTA Hgb 11.4  Plt 431   INR 1.0  aPTT 31   Goal of Therapy:  Heparin level 0.3-0.7 units/ml Monitor platelets by anticoagulation protocol: Yes  10/29 0209 HL 0.22, subtherapeutic   Plan:  Bolus 1400 units x 1 Increase heparin infusion to 1450 units/hr Recheck HL in 6 hr after rate change CBC daily while on heparin  Otelia Sergeant, PharmD, [facility] 07/30/2023 2:40 AM

## 2023-07-30 NOTE — Progress Notes (Addendum)
8413 Complains of nausea. Given prn. In the same moment demands food. Hateful attitude. States she has not eaten for 2 days, that she is diabetic and is nauseated. Argumentitive. States she has never been NPO for any cardiac cath. or procedure.  She is diabetic and must eat. 0800 Ordered a clear liquid diet per Dr. Okey Dupre. Patient is satisfied 1000-1600 Patient resting in bed. Up to the toilet un assisted. Denies pain. 1600 Complains of stomach cramps and requested prn. Given prn Hydrocodon. 1630 Patient asleep. 1800 Eating dinner continues to deny chest pain.

## 2023-07-30 NOTE — Progress Notes (Signed)
Patient requesting something to eat stating she has not ate in 3 days. Patient informed of the order of NPO. That she will start spiraling soon due to her gastroparesis. That she will start vomiting and her chest pain will start back up. Reached out to Dr Para March given okay to give Pt gingerale and one time dose of zofran. Diet to be changed by Primary attending if they think appropriate.

## 2023-07-30 NOTE — Inpatient Diabetes Management (Addendum)
Inpatient Diabetes Program Recommendations  AACE/ADA: New Consensus Statement on Inpatient Glycemic Control (2015)  Target Ranges:  Prepandial:   less than 140 mg/dL      Peak postprandial:   less than 180 mg/dL (1-2 hours)      Critically ill patients:  140 - 180 mg/dL    Latest Reference Range & Units 02/20/23 01:08 07/21/23 07:05  Hemoglobin A1C 4.8 - 5.6 % 12.2 (H) 11.0 (H)  269 mg/dl  (H): Data is abnormally high  Latest Reference Range & Units 07/30/23 01:08 07/30/23 03:31  Glucose-Capillary 70 - 99 mg/dL 213 (H) 086 (H)  3 units Novolog   (H): Data is abnormally high   Admit with: CP/ NSTEMI  History: DM, Gastroparesis, Cocaine Abuse  Home DM Meds: Novolog 0-9 units TID with meals (NOT taking)        Pt states she is taking Lantus 36 units at bedtime + Januvia BID  Current Orders: Novolog 0-9 units Q4 hours (PRN only)    MD- Please change Novolog SSI to scheduled Q4 hour coverage (currently ordered only as prn coverage)  May also consider starting weight based basal insulin: Semglee 15 units Daily (~0.15 units/kg)   Addendum 11:30am--Met w/ pt at bedside.  Pt A&O and able to hold meaningful conversation.  Pt told me she sees an ENDO with [facility]--states last ENDO visit was several months ago, however, I do NOT see any notes from any [facility] ENDO visits.  Pt was admitted to [facility] June 2024 and it looks like she was discharged on Januvia 100 mg daily + Lantus 36 units at bedtime.  It appears pt does not have regular PCP?  Pt states she has traditional CBG meter at home and checks QAM.  Told me her last A1c was high but can't remember the value.    Spoke with patient about her current A1c of 11%.  Explained what an A1c is and what it measures.  Reminded patient that her goal A1c is 7% or less per ADA standards to prevent both acute and long-term complications.  Explained to patient the extreme importance of good glucose control at home especially to help prevent further  cardiac events.  Encouraged patient to check her CBGs at least bid at home (fasting and another check within the day) and to record all CBGs in a logbook for her PCP or Endocrinologist to review.    --Will follow patient during hospitalization--  Ambrose Finland RN, MSN, CDCES Diabetes Coordinator Inpatient Glycemic Control Team Team Pager: [phone] (8a-5p)

## 2023-07-30 NOTE — Progress Notes (Signed)
PHARMACY - ANTICOAGULATION CONSULT NOTE  Pharmacy Consult for heparin drip Indication: chest pain/ACS  Allergies  Allergen Reactions   Quinolones Other (See Comments)   Patient Measurements: Height: 6' (182.9 cm) Weight: 111.4 kg (245 lb 9.5 oz) IBW/kg (Calculated) : 73.1 Heparin Dosing Weight: 95.3 kg  Vital Signs: Temp: 98 F (36.7 C) (10/29 2000) Temp Source: Oral (10/29 2000) BP: 126/76 (10/29 2200) Pulse Rate: 82 (10/29 2200)  Labs: Recent Labs    07/29/23 1640 07/29/23 1833 07/30/23 0009 07/30/23 0209 07/30/23 0209 07/30/23 0831 07/30/23 1419 07/30/23 2129  HGB 11.4*  --   --  10.1*  --   --   --   --   HCT 36.6  --   --  33.2*  --   --   --   --   PLT 431*  --   --  349  --   --   --   --   APTT 31  --   --   --   --   --   --   --   LABPROT 13.4  --   --   --   --   --   --   --   INR 1.0  --   --   --   --   --   --   --   HEPARINUNFRC  --   --   --  0.22*   < > 0.34 0.24* 0.46  CREATININE 0.57  --   --  0.49  --   --   --   --   TROPONINIHS 817* 789* 622* 655*  --   --   --   --    < > = values in this interval not displayed.   Estimated Creatinine Clearance: 125.2 mL/min (by C-G formula based on SCr of 0.49 mg/dL).  Medical History: Past Medical History:  Diagnosis Date   Acute ST elevation myocardial infarction (STEMI) involving other coronary artery of inferior wall (HCC) 07/21/2023   Barrett esophagus    Diabetes mellitus without complication (HCC)    Gastroparesis    Migraines    MRSA (methicillin resistant staph aureus) culture positive    2009   Obesity    ST elevation myocardial infarction (STEMI) (HCC) 05/02/2021   Type 2 diabetes mellitus (HCC) 09/06/2016   Last Assessment & Plan:    Ordered new glucometer and test strips  Last Assessment & Plan:    Ordered new glucometer and test strips     Medications:  Facility-Administered Medications Prior to Admission  Medication Dose Route Frequency Provider Last Rate Last Admin    nitroGLYCERIN (NITROSTAT) SL tablet 0.4 mg  0.4 mg Sublingual Q5 min PRN Furth, Cadence H, PA-C   0.4 mg at 11/21/21 1025   Medications Prior to Admission  Medication Sig Dispense Refill Last Dose   aspirin EC 81 MG EC tablet Take 1 tablet (81 mg total) by mouth daily. Swallow whole. 30 tablet 11    atorvastatin (LIPITOR) 80 MG tablet Take 1 tablet (80 mg total) by mouth daily. 90 tablet 1    escitalopram (LEXAPRO) 10 MG tablet Take 1 tablet (10 mg total) by mouth daily. 30 tablet 2    insulin aspart (NOVOLOG) 100 UNIT/ML injection Inject 0-9 Units into the skin 3 (three) times daily with meals. 10 mL 11    metoprolol succinate (TOPROL-XL) 100 MG 24 hr tablet Take 1 tablet (100 mg total) by mouth daily. Take with or  immediately following a meal. PLEASE SCHEDULE OFFICE VISIT FOR FURTHER REFILLS. THANK YOU! 30 tablet 2    pantoprazole (PROTONIX) 40 MG tablet Take 1 tablet (40 mg total) by mouth daily. PLEASE SCHEDULE OFFICE VISIT FOR FURTHER REFILLS. THANK YOU! 30 tablet 1    promethazine (PHENERGAN) 25 MG suppository Place 1 suppository (25 mg total) rectally every 8 (eight) hours as needed for refractory nausea / vomiting. 12 each 0    ticagrelor (BRILINTA) 90 MG TABS tablet Take 1 tablet (90 mg total) by mouth 2 (two) times daily. 60 tablet 11    Scheduled:   aspirin EC  81 mg Oral Daily   atorvastatin  80 mg Oral Daily   Chlorhexidine Gluconate Cloth  6 each Topical Daily   diltiazem  30 mg Oral Q6H   [START ON 07/31/2023] influenza vac split trivalent PF  0.5 mL Intramuscular Tomorrow-1000   insulin aspart  0-9 Units Subcutaneous Q4H   nicotine  21 mg Transdermal Daily   [START ON 07/31/2023] prasugrel  10 mg Oral Daily   sodium chloride flush  3 mL Intravenous Q12H   Infusions:   heparin 1,600 Units/hr (07/30/23 1629)   Assessment: 42 yo F to start heparin drip for ACS/STEMI.  HX DM2, gastroparesis, cocaine abuse. Admitted w/ CP, recent STEMI + stents, recently discharged  07/24/23. On Ticagrelor PTA Hgb 11.4  Plt 431   INR 1.0  aPTT 31  Goal of Therapy:  Heparin level 0.3-0.7 units/ml Monitor platelets by anticoagulation protocol: Yes  10/29 0209 HL 0.22, subtherapeutic 10/29 0831 HL 0.34, therapeutic x 1  10/29 1419 HL 0.24, subtherapeutic 10/29 2129 HL 0.46, therapeutic x 1   Plan:  Continue heparin infusion at 1600 units/hr  Check HL w/ AM labs to confirm  CBC daily while on heparin  Otelia Sergeant, PharmD, [facility] 07/30/2023 10:25 PM

## 2023-07-31 ENCOUNTER — Other Ambulatory Visit: Payer: Self-pay

## 2023-07-31 ENCOUNTER — Encounter: Payer: Self-pay | Admitting: Internal Medicine

## 2023-07-31 ENCOUNTER — Telehealth (HOSPITAL_COMMUNITY): Payer: Self-pay | Admitting: Pharmacy Technician

## 2023-07-31 ENCOUNTER — Other Ambulatory Visit (HOSPITAL_COMMUNITY): Payer: Self-pay

## 2023-07-31 DIAGNOSIS — Z720 Tobacco use: Secondary | ICD-10-CM | POA: Diagnosis not present

## 2023-07-31 DIAGNOSIS — E1165 Type 2 diabetes mellitus with hyperglycemia: Secondary | ICD-10-CM | POA: Diagnosis not present

## 2023-07-31 DIAGNOSIS — E782 Mixed hyperlipidemia: Secondary | ICD-10-CM

## 2023-07-31 DIAGNOSIS — I10 Essential (primary) hypertension: Secondary | ICD-10-CM | POA: Diagnosis not present

## 2023-07-31 DIAGNOSIS — I214 Non-ST elevation (NSTEMI) myocardial infarction: Secondary | ICD-10-CM | POA: Diagnosis not present

## 2023-07-31 LAB — CBC
HCT: 34.5 % — ABNORMAL LOW (ref 36.0–46.0)
Hemoglobin: 10.4 g/dL — ABNORMAL LOW (ref 12.0–15.0)
MCH: 22.2 pg — ABNORMAL LOW (ref 26.0–34.0)
MCHC: 30.1 g/dL (ref 30.0–36.0)
MCV: 73.6 fL — ABNORMAL LOW (ref 80.0–100.0)
Platelets: 333 10*3/uL (ref 150–400)
RBC: 4.69 MIL/uL (ref 3.87–5.11)
RDW: 17.2 % — ABNORMAL HIGH (ref 11.5–15.5)
WBC: 8.6 10*3/uL (ref 4.0–10.5)
nRBC: 0 % (ref 0.0–0.2)

## 2023-07-31 LAB — BASIC METABOLIC PANEL
Anion gap: 8 (ref 5–15)
BUN: 10 mg/dL (ref 6–20)
CO2: 24 mmol/L (ref 22–32)
Calcium: 8.3 mg/dL — ABNORMAL LOW (ref 8.9–10.3)
Chloride: 101 mmol/L (ref 98–111)
Creatinine, Ser: 0.51 mg/dL (ref 0.44–1.00)
GFR, Estimated: >60 mL/min (ref >60)
Glucose, Bld: 229 mg/dL — ABNORMAL HIGH (ref 70–99)
Potassium: 3.5 mmol/L (ref 3.5–5.1)
Sodium: 133 mmol/L — ABNORMAL LOW (ref 135–145)

## 2023-07-31 LAB — GLUCOSE, CAPILLARY
Glucose-Capillary: 226 mg/dL — ABNORMAL HIGH (ref 70–99)
Glucose-Capillary: 201 mg/dL — ABNORMAL HIGH (ref 70–99)
Glucose-Capillary: 190 mg/dL — ABNORMAL HIGH (ref 70–99)
Glucose-Capillary: 223 mg/dL — ABNORMAL HIGH (ref 70–99)

## 2023-07-31 LAB — PLATELET FUNCTION ASSAY: Collagen / Epinephrine: 128 s (ref 0–193)

## 2023-07-31 LAB — PHOSPHORUS: Phosphorus: 4.1 mg/dL (ref 2.5–4.6)

## 2023-07-31 LAB — HEPARIN LEVEL (UNFRACTIONATED): Heparin Unfractionated: 0.4 [IU]/mL (ref 0.30–0.70)

## 2023-07-31 LAB — MAGNESIUM: Magnesium: 1.8 mg/dL (ref 1.7–2.4)

## 2023-07-31 MED ORDER — PRASUGREL HCL 10 MG PO TABS: 10.0000 mg | ORAL_TABLET | Freq: Every day | ORAL | 1 refills | Status: AC

## 2023-07-31 MED ORDER — DILTIAZEM HCL ER COATED BEADS 120 MG PO CP24: 120.0000 mg | ORAL_CAPSULE | Freq: Every day | ORAL | 1 refills | Status: AC

## 2023-07-31 MED ORDER — ENOXAPARIN SODIUM 60 MG/0.6ML IJ SOSY: 0.5000 mg/kg | PREFILLED_SYRINGE | INTRAMUSCULAR | Status: DC

## 2023-07-31 MED ORDER — DILTIAZEM HCL ER COATED BEADS 120 MG PO CP24
120.0000 mg | ORAL_CAPSULE | Freq: Every day | ORAL | Status: DC
  Administered 2023-07-31: 120 mg via ORAL
  Filled 2023-07-31: qty 1

## 2023-07-31 MED ORDER — INSULIN GLARGINE-YFGN 100 UNIT/ML ~~LOC~~ SOLN
8.0000 [IU] | Freq: Every day | SUBCUTANEOUS | Status: DC
Start: 2023-07-31 — End: 2023-08-01
  Administered 2023-07-31: 8 [IU] via SUBCUTANEOUS
  Filled 2023-07-31: qty 0.08

## 2023-07-31 MED ORDER — METOCLOPRAMIDE HCL 5 MG/ML IJ SOLN
10.0000 mg | Freq: Three times a day (TID) | INTRAMUSCULAR | Status: DC
  Administered 2023-07-31 (×2): 10 mg via INTRAVENOUS
  Filled 2023-07-31 (×2): qty 2

## 2023-07-31 MED ORDER — PANTOPRAZOLE SODIUM 40 MG PO TBEC
40.0000 mg | DELAYED_RELEASE_TABLET | Freq: Every day | ORAL | Status: DC
  Administered 2023-07-31: 40 mg via ORAL
  Filled 2023-07-31: qty 1

## 2023-07-31 MED ORDER — FENTANYL CITRATE PF 50 MCG/ML IJ SOSY
25.0000 ug | PREFILLED_SYRINGE | INTRAMUSCULAR | Status: DC | PRN
  Administered 2023-07-31 (×2): 25 ug via INTRAVENOUS
  Filled 2023-07-31 (×2): qty 1

## 2023-07-31 MED ORDER — ESCITALOPRAM OXALATE 10 MG PO TABS
10.0000 mg | ORAL_TABLET | Freq: Every day | ORAL | Status: DC
  Administered 2023-07-31: 10 mg via ORAL
  Filled 2023-07-31: qty 1

## 2023-07-31 MED ORDER — REPATHA SURECLICK 140 MG/ML ~~LOC~~ SOAJ: 140.0000 mg | SUBCUTANEOUS | 2 refills | Status: AC

## 2023-07-31 NOTE — Progress Notes (Signed)
   Patient Name: Karen Ruiz Date of Encounter: 07/31/2023 [facility] Cardiologist: None  Followed by [facility] cardiology  Interval Summary  .    Patient seen on a.m. rounds.  Denies any chest pain or shortness of breath.  No further events noted overnight.  Has tolerated diltiazem for coronary spasms without incident.  Vital Signs .    Vitals:   07/31/23 0500 07/31/23 0600 07/31/23 0654 07/31/23 0700  BP: (!) 104/59 125/73 136/87 126/84  Pulse: 74 70 85 77  Resp: 18 17 18 14   Temp:      TempSrc:      SpO2: 97% 96% 99% 99%  Weight:      Height:        Intake/Output Summary (Last 24 hours) at 07/31/2023 0755 Last data filed at 07/31/2023 0323 Gross per 24 hour  Intake 941.13 ml  Output 1800 ml  Net -858.87 ml      07/31/2023    4:09 AM 07/30/2023    1:42 AM 07/29/2023    4:39 PM  Last 3 Weights  Weight (lbs) 236 lb 15.9 oz 245 lb 9.5 oz 230 lb  Weight (kg) 107.5 kg 111.4 kg 104.327 kg      Telemetry/ECG    Sinus rhythm- Personally Reviewed  Physical Exam .   GEN: No acute distress.   Neck: No JVD Cardiac: RRR, no murmurs, rubs, or gallops.  Respiratory: Clear to auscultation bilaterally. GI: Soft, nontender, non-distended  MS: No edema  Assessment & Plan .     NSTEMI/CAD with recent PCI/DES -Presented with severe chest pain similar to prior events -High-sensitivity troponins trended peaking at 817 -EKG with previous changes but no acute changes noted -Started on heparin infusion with 48-hour timeframe at 715 this evening -Nitroglycerin drip but weaned off -Continues to remain chest pain-free on exam -Transition from Brilinta to prasugrel platelet function assay resulted at 130 which causes concerns for her not being compliant with her Brilinta therapy -Continued on aspirin, prasugrel, and statin therapy -Diltiazem consolidated to long-acting 120 mg daily will remain off of Toprol-XL at this time due to soft blood  pressures -Echocardiogram revealed LVEF of 55 to 60% -Continue with telemetry monitoring -EKG as needed for pain or changes -No further indication for continued ischemic workup at this time  Hypertension -Blood pressure 130/83 -Diltiazem consolidated to 120 mg daily -Vital signs per unit protocol  Hyperlipidemia -Continued on atorvastatin 80 mg daily -Last LDL was 165 -Patient advised that she follows up with primary cardiologist to [facility] discuss starting PCSK9 inhibitor therapy for elevated LP(a)  Uncontrolled type 2 diabetes -Continued on insulin therapy -Continue management per IM  Polysubstance abuse -Nicotine patch ordered for smoking as she declined -Total cessation is recommended for all illicit drug use his urine drug screen was positive for marijuana -Cessation is still recommended for smoking as well For questions or updates, please contact [facility] Please consult [email or web address] for contact info under        Signed, Logun Colavito, NP

## 2023-07-31 NOTE — Discharge Summary (Signed)
Physician Discharge Summary  Karen Ruiz Karen Ruiz:[id number] DOB: 06-22-1981 DOA: 07/29/2023  PCP: Dan Humphreys, MD  Admit date: 07/29/2023 Discharge date: 07/31/2023  Admitted From: Home Disposition:  Home  Recommendations for Outpatient Follow-up:  Follow up with PCP in 1-2 weeks Follow-up with cardiology at [facility] as directed Follow-up with endocrinology to discuss insulin titration and lowering of hemoglobin A1c  Home Health: No Equipment/Devices: None  Discharge Condition: Stable CODE STATUS: Full Diet recommendation: Heart healthy/carb modified  Brief/Interim Summary: 42 year old woman with history of CAD status post recent inferolateral STEMI status post PCI to distal LCx/OM branches complicated by subacute stent thrombosis, uncontrolled diabetes mellitus, hypertension, hyperlipidemia, and polysubstance abuse admitted for recurrent chest pain.  Patient underwent cardiac catheterization earlier this month.  Feeling well after hospital discharge.  Discharged from [facility] on 10/23.  Developed severe pains at the grocery store 1 day prior to admission.  Reminiscent of previous MIs.  Directed to the emergency room.  Seen by cardiology.  Chest pain resolved after nitroglycerin infusion.  Suspect chest pain driven by possible GI causes versus substance use.  Patient does report compliance with her medications.` Ischemic evaluation not pursued during this admission.    Discharge Diagnoses:  Principal Problem:   Non-ST elevated myocardial infarction [facility]) Active Problems:   Uncontrolled type 2 diabetes mellitus with hyperglycemia (HCC)   Essential hypertension   Tobacco abuse   Coronary stent thrombosis   NSTEMI (non-ST elevated myocardial infarction) (HCC)   Mixed hyperlipidemia  Angina with elevated troponin NSTEMI ruled out S/p nitroglycerin IV infusion, chest pain resolved, nitro infusion was stopped Completed 48 hours of heparin infusion.  Discontinue Brilinta.  Start  prasugrel at time of discharge.  Discontinue metoprolol.  Transition to diltiazem extended release 120 mg daily.  No chest pain at time of discharge.  Continue aspirin, Lipitor, Effient.  Repatha prescribed on DC.      Discharge Instructions  Discharge Instructions     Diet - low sodium heart healthy   Complete by: As directed    Increase activity slowly   Complete by: As directed       Allergies as of 07/31/2023       Reactions   Quinolones Other (See Comments)        Medication List     STOP taking these medications    metoprolol succinate 100 MG 24 hr tablet Commonly known as: TOPROL-XL   ticagrelor 90 MG Tabs tablet Commonly known as: BRILINTA       TAKE these medications    aspirin EC 81 MG tablet Take 1 tablet (81 mg total) by mouth daily. Swallow whole.   atorvastatin 80 MG tablet Commonly known as: LIPITOR Take 1 tablet (80 mg total) by mouth daily.   diltiazem 120 MG 24 hr capsule Commonly known as: CARDIZEM CD Take 1 capsule (120 mg total) by mouth daily. Start taking on: August 01, 2023   escitalopram 10 MG tablet Commonly known as: LEXAPRO Take 1 tablet (10 mg total) by mouth daily.   insulin aspart 100 UNIT/ML injection Commonly known as: novoLOG Inject 0-9 Units into the skin 3 (three) times daily with meals.   pantoprazole 40 MG tablet Commonly known as: PROTONIX Take 1 tablet (40 mg total) by mouth daily. PLEASE SCHEDULE OFFICE VISIT FOR FURTHER REFILLS. THANK YOU!   prasugrel 10 MG Tabs tablet Commonly known as: EFFIENT Take 1 tablet (10 mg total) by mouth daily. Start taking on: August 01, 2023   promethazine 25 MG  suppository Commonly known as: PHENERGAN Place 1 suppository (25 mg total) rectally every 8 (eight) hours as needed for refractory nausea / vomiting.   Repatha SureClick 140 MG/ML Soaj Generic drug: Evolocumab Inject 140 mg into the skin every 14 (fourteen) days.        Follow-up Information     Dan Humphreys, MD. Schedule an appointment as soon as possible for a visit in 1 week(s).   Specialty: Family Medicine Contact information: [address] [place] [address] [phone]                Allergies  Allergen Reactions   Quinolones Other (See Comments)    Consultations: Cardiology   Procedures/Studies: US ABDOMEN LIMITED RUQ (LIVER/GB)  Result Date: 07/30/2023 CLINICAL DATA:  42 year old female with history of chest pain. EXAM: ULTRASOUND ABDOMEN LIMITED RIGHT UPPER QUADRANT COMPARISON:  Abdominal ultrasound 08/26/2011. FINDINGS: Gallbladder: Gallbladder is not distended. There are some tiny echogenic foci lying dependently with posterior acoustic shadowing, compatible with gallstones, measuring up to 6 mm. Gallbladder wall thickness is normal at 1.8 mm. No pericholecystic fluid. No sonographic Murphy's sign. Common bile duct: Diameter: 2.4 mm Liver: No focal lesion identified. Diffusely increased parenchymal echogenicity. Portal vein is patent on color Doppler imaging with normal direction of blood flow towards the liver. Other: None. IMPRESSION: 1. No acute findings to account for the patient's symptoms. 2. Cholelithiasis without evidence of acute cholecystitis. 3. Diffusely increased hepatic echogenicity, indicative of a background of hepatic steatosis. Electronically Signed   By: Trudie Reed M.D.   On: 07/30/2023 05:20   CT Angio Chest/Abd/Pel for Dissection W and/or Wo Contrast  Result Date: 07/29/2023 CLINICAL DATA:  Severe chest pain EXAM: CT ANGIOGRAPHY CHEST, ABDOMEN AND PELVIS TECHNIQUE: Non-contrast CT of the chest was initially obtained. Multidetector CT imaging through the chest, abdomen and pelvis was performed using the standard protocol during bolus administration of intravenous contrast. Multiplanar reconstructed images and MIPs were obtained and reviewed to evaluate the vascular anatomy. RADIATION DOSE REDUCTION: This exam was performed according to  the departmental dose-optimization program which includes automated exposure control, adjustment of the mA and/or kV according to patient size and/or use of iterative reconstruction technique. CONTRAST:  OMNIPAQUE IOHEXOL 350 MG/ML SOLN COMPARISON:  Chest CT 02/19/2023, CT abdomen pelvis 12/30/2021 FINDINGS: CTA CHEST FINDINGS Cardiovascular: Non contrasted images of the chest demonstrate no acute intramural hematoma. Negative for aortic aneurysm or dissection. Cardiac size is normal. Coronary calcifications and stent. No pericardial effusion Mediastinum/Nodes: Midline trachea. No thyroid mass. No suspicious lymph nodes. Esophagus within normal limits. Increased fluid density structure in the right cardio phrenic fat, measuring 2.8 by 1.9 cm. Lungs/Pleura: Lungs are clear. No pleural effusion or pneumothorax. Musculoskeletal: No chest wall abnormality. No acute or significant osseous findings. Review of the MIP images confirms the above findings. CTA ABDOMEN AND PELVIS FINDINGS VASCULAR Aorta: Normal caliber aorta without aneurysm, dissection, vasculitis or significant stenosis. Mild atherosclerosis Celiac: Patent without evidence of aneurysm, dissection, vasculitis or significant stenosis. SMA: Patent without evidence of aneurysm, dissection, vasculitis or significant stenosis. Renals: Both renal arteries are patent without evidence of aneurysm, dissection, vasculitis, fibromuscular dysplasia or significant stenosis. IMA: Patent without evidence of aneurysm, dissection, vasculitis or significant stenosis. Inflow: Patent without evidence of aneurysm, dissection, vasculitis or significant stenosis. Veins: Suboptimally evaluated. Review of the MIP images confirms the above findings. NON-VASCULAR Hepatobiliary: Small gallstones. No focal hepatic abnormality or biliary dilatation. Pancreas: Unremarkable. No pancreatic ductal dilatation or surrounding inflammatory changes.  Spleen: Normal in size without focal  abnormality. Adrenals/Urinary Tract: Adrenal glands are unremarkable. Kidneys are normal, without renal calculi, focal lesion, or hydronephrosis. Bladder is unremarkable. Stomach/Bowel: Stomach is within normal limits. Appendix appears normal. No evidence of bowel wall thickening, distention, or inflammatory changes. Lymphatic: No suspicious lymph nodes. Reproductive: Uterus and bilateral adnexa are unremarkable. Other: Negative for pelvic effusion or free air Musculoskeletal: No acute osseous abnormality Review of the MIP images confirms the above findings. IMPRESSION: 1. Negative for aortic aneurysm or dissection. 2. No CT evidence for acute intrathoracic, abdominal, or pelvic abnormality. 3. Cholelithiasis. 4. Increased fluid density structure in the right cardio phrenic fat, measuring 2.8 x 1.9 cm, possible enlarging pericardial cyst. Attention on follow-up chest CT. Electronically Signed   By: Jasmine Pang M.D.   On: 07/29/2023 21:33   CARDIAC CATHETERIZATION  Result Date: 07/23/2023   Mid LAD lesion is 30% stenosed.   Prox RCA to Mid RCA lesion is 30% stenosed.   1st Diag lesion is 100% stenosed.   2nd Diag lesion is 100% stenosed.   RPDA lesion is 50% stenosed.   Dist Cx lesion is 50% stenosed.   Non-stenotic Ost LAD to Mid LAD lesion was previously treated.   Non-stenotic 3rd Mrg lesion was previously treated.   Balloon angioplasty was performed using a BALLN  TREK NEO RX 3.0X12.   Post intervention, there is a 0% residual stenosis.   There is mild left ventricular systolic dysfunction.   LV end diastolic pressure is mildly elevated.   The left ventricular ejection fraction is 45-50% by visual estimate. 1.  Subacute stent thrombosis in the distal left circumflex with patent stent in the OM branch.  IVUS showed evidence of stent mall apposition proximally.  No evidence of edge dissection.  Patent stents in the LAD and RCA. 2.  Mildly reduced LV systolic function with mildly elevated left ventricular  end-diastolic pressure. 3.  Successful balloon angioplasty to the proximal segment of the left circumflex stent using a 3.0 x 12 mm noncompliant balloon to 16 atm. Recommendations: Continue dual antiplatelet therapy indefinitely. Aggressive treatment of risk factors and consider a PCSK9 inhibitor given elevated LDL and LP(a).   ECHOCARDIOGRAM COMPLETE  Result Date: 07/21/2023    ECHOCARDIOGRAM REPORT   Patient Name:   MADELYNN SHOSTAK Date of Exam: 07/21/2023 Medical Rec #:  [id number]            Height:       66.0 in Accession #:    [id number]           Weight:       240.0 lb Date of Birth:  10-27-1980            BSA:          2.161 m Patient Age:    42 years             BP:           149/110 mmHg Patient Gender: F                    HR:           79 bpm. Exam Location:  [facility] Procedure: 2D Echo, Cardiac Doppler, Color Doppler and Strain Analysis Indications:     Acute myocardial infarction, unspecified I21.9  History:         Patient has prior history of Echocardiogram examinations. Risk  Factors:Diabetes.  Sonographer:     Neysa Bonito Roar Referring Phys:  [id number] QIONGEXB A ARIDA Diagnosing Phys: Debbe Odea MD  Sonographer Comments: Global longitudinal strain was attempted. IMPRESSIONS  1. Left ventricular ejection fraction, by estimation, is 55 to 60%. The left ventricle has normal function. The left ventricle has no regional wall motion abnormalities. Left ventricular diastolic parameters were normal. The average left ventricular global longitudinal strain is -16.9 %. The global longitudinal strain is normal.  2. Right ventricular systolic function is normal. The right ventricular size is normal.  3. The mitral valve is normal in structure. No evidence of mitral valve regurgitation.  4. The aortic valve was not well visualized. Aortic valve regurgitation is not visualized.  5. The inferior vena cava is normal in size with greater than 50% respiratory variability, suggesting right atrial  pressure of 3 mmHg. FINDINGS  Left Ventricle: Left ventricular ejection fraction, by estimation, is 55 to 60%. The left ventricle has normal function. The left ventricle has no regional wall motion abnormalities. The average left ventricular global longitudinal strain is -16.9 %. The global longitudinal strain is normal. The left ventricular internal cavity size was normal in size. There is no left ventricular hypertrophy. Left ventricular diastolic parameters were normal. Right Ventricle: The right ventricular size is normal. No increase in right ventricular wall thickness. Right ventricular systolic function is normal. Left Atrium: Left atrial size was normal in size. Right Atrium: Right atrial size was normal in size. Pericardium: There is no evidence of pericardial effusion. Mitral Valve: The mitral valve is normal in structure. No evidence of mitral valve regurgitation. MV peak gradient, 2.8 mmHg. The mean mitral valve gradient is 1.0 mmHg. Tricuspid Valve: The tricuspid valve is normal in structure. Tricuspid valve regurgitation is not demonstrated. Aortic Valve: The aortic valve was not well visualized. Aortic valve regurgitation is not visualized. Aortic valve mean gradient measures 5.0 mmHg. Aortic valve peak gradient measures 8.2 mmHg. Aortic valve area, by VTI measures 2.98 cm. Pulmonic Valve: The pulmonic valve was not well visualized. Pulmonic valve regurgitation is not visualized. Aorta: The aortic root was not well visualized. Venous: The inferior vena cava is normal in size with greater than 50% respiratory variability, suggesting right atrial pressure of 3 mmHg. IAS/Shunts: No atrial level shunt detected by color flow Doppler.  LEFT VENTRICLE PLAX 2D LVIDd:         5.00 cm   Diastology LVIDs:         3.60 cm   LV e' medial:    8.81 cm/s LV PW:         1.00 cm   LV E/e' medial:  8.7 LV IVS:        1.00 cm   LV e' lateral:   12.20 cm/s LVOT diam:     2.00 cm   LV E/e' lateral: 6.3 LV SV:         81  LV SV Index:   38        2D Longitudinal Strain LVOT Area:     3.14 cm  2D Strain GLS Avg:     -16.9 %  RIGHT VENTRICLE RV Basal diam:  2.80 cm RV Mid diam:    3.00 cm RV S prime:     10.80 cm/s TAPSE (M-mode): 2.1 cm LEFT ATRIUM             Index        RIGHT ATRIUM  Index LA diam:        3.70 cm 1.71 cm/m   RA Area:     12.70 cm LA Vol (A2C):   67.9 ml 31.42 ml/m  RA Volume:   27.80 ml  12.86 ml/m LA Vol (A4C):   52.2 ml 24.16 ml/m LA Biplane Vol: 60.0 ml 27.77 ml/m  AORTIC VALVE                     PULMONIC VALVE AV Area (Vmax):    2.83 cm      PV Vmax:        0.96 m/s AV Area (Vmean):   2.47 cm      PV Peak grad:   3.7 mmHg AV Area (VTI):     2.98 cm      RVOT Peak grad: 3 mmHg AV Vmax:           143.00 cm/s AV Vmean:          104.000 cm/s AV VTI:            0.272 m AV Peak Grad:      8.2 mmHg AV Mean Grad:      5.0 mmHg LVOT Vmax:         129.00 cm/s LVOT Vmean:        81.800 cm/s LVOT VTI:          0.258 m LVOT/AV VTI ratio: 0.95 MITRAL VALVE MV Area (PHT): 4.39 cm    SHUNTS MV Area VTI:   3.27 cm    Systemic VTI:  0.26 m MV Peak grad:  2.8 mmHg    Systemic Diam: 2.00 cm MV Mean grad:  1.0 mmHg MV Vmax:       0.84 m/s MV Vmean:      57.0 cm/s MV Decel Time: 173 msec MV E velocity: 77.00 cm/s MV A velocity: 59.70 cm/s MV E/A ratio:  1.29 MV A Prime:    10.9 cm/s Debbe Odea MD Electronically signed by Debbe Odea MD Signature Date/Time: 07/21/2023/3:01:09 PM    Final    CARDIAC CATHETERIZATION  Result Date: 07/20/2023   Prox RCA to Mid RCA lesion is 30% stenosed.   RPDA lesion is 50% stenosed.   Dist Cx lesion is 90% stenosed.   3rd Mrg lesion is 70% stenosed.   Mid LAD lesion is 30% stenosed.   1st Diag lesion is 100% stenosed.   2nd Diag lesion is 100% stenosed.   Previously placed Ost LAD to Mid LAD stent of unknown type is  widely patent.   A drug-eluting stent was successfully placed using a STENT ONYX FRONTIER 2.5X18.   A drug-eluting stent was successfully placed  using a STENT ONYX FRONTIER 2.0X15.   Post intervention, there is a 0% residual stenosis.   Post intervention, there is a 0% residual stenosis.   There is mild to moderate left ventricular systolic dysfunction.   LV end diastolic pressure is moderately elevated.   The left ventricular ejection fraction is 35-45% by visual estimate. 1.  Patent LAD and RCA stents with mild in-stent restenosis.  Chronically occluded first and second diagonal.  80 to 90% hazy stenosis in the distal left circumflex at the bifurcation of an OM branch seems to be the culprit for inferior/inferolateral ST elevation myocardial infarction. 2.  Mildly to moderately reduced LV systolic function with moderately elevated left ventricular end-diastolic pressure. 3.  Successful complex bifurcation angioplasty utilizing the reverse mini crush technique to the distal  left circumflex/OM bifurcation.  Initially, a stent was placed in the distal left circumflex but there was loss of flow in the OM branch and thus I elected to treat given that the patient was highly symptomatic. Recommendations: Dual antiplatelet therapy for at least 12 months and preferably indefinitely. Aggrastat infusion for 12 hours given risk of stent thrombosis in the setting of vomiting. Nitroglycerin drip for pain control. Check urine drug screen. Aggressive treatment of risk factors and smoking cessation.      Subjective: Seen and examined on the day of discharge.  Stable no distress.  Appropriate for discharge home.  Discharge Exam: Vitals:   07/31/23 0900 07/31/23 1201  BP: 130/83 119/80  Pulse: 88 79  Resp: 19   Temp:  98 F (36.7 C)  SpO2: 100% 99%   Vitals:   07/31/23 0700 07/31/23 0800 07/31/23 0900 07/31/23 1201  BP: 126/84 121/77 130/83 119/80  Pulse: 77 74 88 79  Resp: 14 (!) 21 19   Temp:  98 F (36.7 C)  98 F (36.7 C)  TempSrc:  Oral  Oral  SpO2: 99% 96% 100% 99%  Weight:      Height:        General: Pt is alert, awake, not in acute  distress Cardiovascular: RRR, S1/S2 +, no rubs, no gallops Respiratory: CTA bilaterally, no wheezing, no rhonchi Abdominal: Soft, NT, ND, bowel sounds + Extremities: no edema, no cyanosis    The results of significant diagnostics from this hospitalization (including imaging, microbiology, ancillary and laboratory) are listed below for reference.     Microbiology: No results found for this or any previous visit (from the past 240 hour(s)).   Labs: BNP (last 3 results) Recent Labs    02/19/23 1450  BNP 13.5   Basic Metabolic Panel: Recent Labs  Lab 07/29/23 1640 07/30/23 0209 07/31/23 0441  NA 130* 132* 133*  K 4.0 3.7 3.5  CL 97* 100 101  CO2 23 24 24   GLUCOSE 339* 250* 229*  BUN 11 11 10   CREATININE 0.57 0.49 0.51  CALCIUM 8.7* 8.1* 8.3*  MG  --   --  1.8  PHOS  --   --  4.1   Liver Function Tests: Recent Labs  Lab 07/29/23 1640 07/30/23 0209  AST 19 18  ALT 20 18  ALKPHOS 85 69  BILITOT 0.4 0.5  PROT 7.3 6.2*  ALBUMIN 3.5 3.0*   No results for input(s): "LIPASE", "AMYLASE" in the last 168 hours. No results for input(s): "AMMONIA" in the last 168 hours. CBC: Recent Labs  Lab 07/29/23 1640 07/30/23 0209 07/31/23 0441  WBC 12.6* 11.2* 8.6  NEUTROABS 7.8*  --   --   HGB 11.4* 10.1* 10.4*  HCT 36.6 33.2* 34.5*  MCV 72.6* 73.1* 73.6*  PLT 431* 349 333   Cardiac Enzymes: No results for input(s): "CKTOTAL", "CKMB", "CKMBINDEX", "TROPONINI" in the last 168 hours. BNP: Invalid input(s): "POCBNP" CBG: Recent Labs  Lab 07/30/23 1944 07/30/23 2325 07/31/23 0358 07/31/23 0747 07/31/23 1140  GLUCAP 218* 198* 226* 201* 190*   D-Dimer No results for input(s): "DDIMER" in the last 72 hours. Hgb A1c No results for input(s): "HGBA1C" in the last 72 hours. Lipid Profile No results for input(s): "CHOL", "HDL", "LDLCALC", "TRIG", "CHOLHDL", "LDLDIRECT" in the last 72 hours. Thyroid function studies Recent Labs    07/29/23 1833  TSH 4.787*   Anemia  work up Recent Labs    07/30/23 0209  VITAMINB12 200  FOLATE 10.5  TIBC  384  IRON 17*   Urinalysis    Component Value Date/Time   COLORURINE YELLOW (A) 11/21/2021 1400   APPEARANCEUR HAZY (A) 11/21/2021 1400   LABSPEC 1.036 (H) 11/21/2021 1400   PHURINE 8.0 11/21/2021 1400   GLUCOSEU >=500 (A) 11/21/2021 1400   HGBUR NEGATIVE 11/21/2021 1400   BILIRUBINUR NEGATIVE 11/21/2021 1400   KETONESUR 80 (A) 11/21/2021 1400   PROTEINUR NEGATIVE 11/21/2021 1400   NITRITE NEGATIVE 11/21/2021 1400   LEUKOCYTESUR NEGATIVE 11/21/2021 1400   Sepsis Labs Recent Labs  Lab 07/29/23 1640 07/30/23 0209 07/31/23 0441  WBC 12.6* 11.2* 8.6   Microbiology No results found for this or any previous visit (from the past 240 hour(s)).   Time coordinating discharge: Over 30 minutes  SIGNED:   Tresa Moore, MD  [facility] 07/31/2023, 4:18 PM Pager   If 7PM-7AM, please contact night-coverage

## 2023-07-31 NOTE — TOC Benefit Eligibility Note (Signed)
Patient Product/process development scientist completed.    The patient is insured through [facility] MEDICAID.     Ran test claim for Repatha Sureclick 140 mg/ml Soaj and Requires Prior Authorization   This test claim was processed through [employer]- copay amounts may vary at other pharmacies due to [employer], or as the patient moves through the different stages of their insurance plan.     Roland Earl, CPHT Pharmacy Technician III Certified Patient Advocate [facility] Patient Advocate Team Direct Number: [phone]  Fax: [phone]

## 2023-07-31 NOTE — Progress Notes (Signed)
Transition of Care [facility]) - Inpatient Brief Assessment   Patient Details  Name: Karen Ruiz MRN: [id number] Date of Birth: November 29, 1980  [facility] CM/SW Contact:    Darolyn Rua, LCSW Phone Number: 07/31/2023, 4:06 PM   Clinical Narrative:  PCP: Lisabeth Register Insurance: [place] Prepaid Health Plan [facility]   Polysubstance abuse Smoking cessation recommended Marijuana cessation recommended Tox screen on second admission with amphetamine positive, cessation recommended    No TOC discharge planning needs at this time. Please consult TOC should discharge planning needs arise.   Transition of Care Asessment: Insurance and Status: Insurance coverage has been reviewed Patient has primary care physician: Yes Home environment has been reviewed: home Prior level of function:: independent Prior/Current Home Services: No current home services Social Determinants of Health Reivew: SDOH reviewed no interventions necessary Readmission risk has been reviewed: Yes Transition of care needs: no transition of care needs at this time

## 2023-07-31 NOTE — Telephone Encounter (Signed)
Pharmacy Patient Advocate Encounter   Received notification that prior authorization for Repatha SureClick 140MG /ML auto-injectors is required/requested.   Insurance verification completed.   The patient is insured through [facility] .   Per test claim: PA required; PA submitted to above mentioned insurance via CoverMyMeds Key/confirmation #/EOC [facility] Status is pending

## 2023-07-31 NOTE — Progress Notes (Signed)
PHARMACY - ANTICOAGULATION CONSULT NOTE  Pharmacy Consult for heparin drip Indication: chest pain/ACS  Allergies  Allergen Reactions   Quinolones Other (See Comments)   Patient Measurements: Height: 6' (182.9 cm) Weight: 107.5 kg (236 lb 15.9 oz) IBW/kg (Calculated) : 73.1 Heparin Dosing Weight: 95.3 kg  Vital Signs: Temp: 98.5 F (36.9 C) (10/30 0200) Temp Source: Oral (10/30 0200) BP: 125/73 (10/30 0600) Pulse Rate: 70 (10/30 0600)  Labs: Recent Labs    07/29/23 1640 07/29/23 1833 07/30/23 0009 07/30/23 0209 07/30/23 0831 07/30/23 1419 07/30/23 2129 07/31/23 0441  HGB 11.4*  --   --  10.1*  --   --   --  10.4*  HCT 36.6  --   --  33.2*  --   --   --  34.5*  PLT 431*  --   --  349  --   --   --  333  APTT 31  --   --   --   --   --   --   --   LABPROT 13.4  --   --   --   --   --   --   --   INR 1.0  --   --   --   --   --   --   --   HEPARINUNFRC  --   --   --  0.22*   < > 0.24* 0.46 0.40  CREATININE 0.57  --   --  0.49  --   --   --  0.51  TROPONINIHS 817* 789* 622* 655*  --   --   --   --    < > = values in this interval not displayed.   Estimated Creatinine Clearance: 123.1 mL/min (by C-G formula based on SCr of 0.51 mg/dL).  Medical History: Past Medical History:  Diagnosis Date   Acute ST elevation myocardial infarction (STEMI) involving other coronary artery of inferior wall (HCC) 07/21/2023   Barrett esophagus    Diabetes mellitus without complication (HCC)    Gastroparesis    Migraines    MRSA (methicillin resistant staph aureus) culture positive    2009   Obesity    ST elevation myocardial infarction (STEMI) (HCC) 05/02/2021   Type 2 diabetes mellitus (HCC) 09/06/2016   Last Assessment & Plan:    Ordered new glucometer and test strips  Last Assessment & Plan:    Ordered new glucometer and test strips     Medications:  Facility-Administered Medications Prior to Admission  Medication Dose Route Frequency Provider Last Rate Last Admin    nitroGLYCERIN (NITROSTAT) SL tablet 0.4 mg  0.4 mg Sublingual Q5 min PRN Furth, Cadence H, PA-C   0.4 mg at 11/21/21 1025   Medications Prior to Admission  Medication Sig Dispense Refill Last Dose   aspirin EC 81 MG EC tablet Take 1 tablet (81 mg total) by mouth daily. Swallow whole. 30 tablet 11    atorvastatin (LIPITOR) 80 MG tablet Take 1 tablet (80 mg total) by mouth daily. 90 tablet 1    escitalopram (LEXAPRO) 10 MG tablet Take 1 tablet (10 mg total) by mouth daily. 30 tablet 2    insulin aspart (NOVOLOG) 100 UNIT/ML injection Inject 0-9 Units into the skin 3 (three) times daily with meals. 10 mL 11    metoprolol succinate (TOPROL-XL) 100 MG 24 hr tablet Take 1 tablet (100 mg total) by mouth daily. Take with or immediately following a meal. PLEASE SCHEDULE OFFICE VISIT  FOR FURTHER REFILLS. THANK YOU! 30 tablet 2    pantoprazole (PROTONIX) 40 MG tablet Take 1 tablet (40 mg total) by mouth daily. PLEASE SCHEDULE OFFICE VISIT FOR FURTHER REFILLS. THANK YOU! 30 tablet 1    promethazine (PHENERGAN) 25 MG suppository Place 1 suppository (25 mg total) rectally every 8 (eight) hours as needed for refractory nausea / vomiting. 12 each 0    ticagrelor (BRILINTA) 90 MG TABS tablet Take 1 tablet (90 mg total) by mouth 2 (two) times daily. 60 tablet 11    Scheduled:   aspirin EC  81 mg Oral Daily   atorvastatin  80 mg Oral Daily   Chlorhexidine Gluconate Cloth  6 each Topical Daily   diltiazem  30 mg Oral Q6H   influenza vac split trivalent PF  0.5 mL Intramuscular Tomorrow-1000   insulin aspart  0-9 Units Subcutaneous Q4H   nicotine  21 mg Transdermal Daily   prasugrel  10 mg Oral Daily   sodium chloride flush  3 mL Intravenous Q12H   Infusions:   heparin 1,600 Units/hr (07/31/23 0400)   Assessment: 42 yo F to start heparin drip for ACS/STEMI.  HX DM2, gastroparesis, cocaine abuse. Admitted w/ CP, recent STEMI + stents, recently discharged 07/24/23. On Ticagrelor PTA Hgb 11.4  Plt 431   INR  1.0  aPTT 31  Goal of Therapy:  Heparin level 0.3-0.7 units/ml Monitor platelets by anticoagulation protocol: Yes  10/29 0209 HL 0.22, subtherapeutic 10/29 0831 HL 0.34, therapeutic x 1  10/29 1419 HL 0.24, subtherapeutic 10/29 2129 HL 0.46, therapeutic x 1 10/30 0441 HL 0.40, therapeutic x 2   Plan:  Continue heparin infusion at 1600 units/hr  Recheck HL daily w/ AM labs while therapeutic  CBC daily while on heparin  Otelia Sergeant, PharmD, [facility] 07/31/2023 6:38 AM

## 2023-08-01 ENCOUNTER — Other Ambulatory Visit: Payer: Self-pay

## 2023-08-01 NOTE — Telephone Encounter (Signed)
Pharmacy Patient Advocate Encounter  Received notification from [facility] that Prior Authorization for Repatha SureClick 140MG /ML auto-injectors has been DENIED.  Full denial letter will be uploaded to the media tab. See denial reason below. Per your health plan's criteria, this drug is covered if you meet the following: (1) Submission of both of the following: (A) Lab test prior to starting treatment (baseline low-density lipoprotein labs). (B) Lab test after previous treatment (low-density lipoprotein labs showing inadequate control on statin and ezetimibe combination). The information provided does not show that you meet the criteria listed above.  PA #/Case ID/Reference #: [id number]

## 2023-08-10 ENCOUNTER — Emergency Department: Payer: Medicaid Other

## 2023-08-10 ENCOUNTER — Observation Stay
Admission: EM | Admit: 2023-08-10 | Discharge: 2023-08-11 | Discharge status: Against Medical Advice | Payer: Medicaid Other | Attending: Hospitalist | Admitting: Hospitalist

## 2023-08-10 ENCOUNTER — Other Ambulatory Visit: Payer: Self-pay

## 2023-08-10 DIAGNOSIS — R0789 Other chest pain: Secondary | ICD-10-CM

## 2023-08-10 DIAGNOSIS — K3184 Gastroparesis: Secondary | ICD-10-CM | POA: Diagnosis not present

## 2023-08-10 DIAGNOSIS — E1165 Type 2 diabetes mellitus with hyperglycemia: Secondary | ICD-10-CM | POA: Diagnosis not present

## 2023-08-10 DIAGNOSIS — I5022 Chronic systolic (congestive) heart failure: Secondary | ICD-10-CM | POA: Diagnosis not present

## 2023-08-10 DIAGNOSIS — F1910 Other psychoactive substance abuse, uncomplicated: Secondary | ICD-10-CM | POA: Insufficient documentation

## 2023-08-10 DIAGNOSIS — R079 Chest pain, unspecified: Principal | ICD-10-CM

## 2023-08-10 DIAGNOSIS — I5032 Chronic diastolic (congestive) heart failure: Secondary | ICD-10-CM | POA: Diagnosis not present

## 2023-08-10 DIAGNOSIS — I110 Hypertensive heart disease with heart failure: Secondary | ICD-10-CM | POA: Diagnosis not present

## 2023-08-10 DIAGNOSIS — R109 Unspecified abdominal pain: Secondary | ICD-10-CM

## 2023-08-10 DIAGNOSIS — R451 Restlessness and agitation: Secondary | ICD-10-CM

## 2023-08-10 DIAGNOSIS — D72829 Elevated white blood cell count, unspecified: Secondary | ICD-10-CM

## 2023-08-10 DIAGNOSIS — I25119 Atherosclerotic heart disease of native coronary artery with unspecified angina pectoris: Secondary | ICD-10-CM

## 2023-08-10 DIAGNOSIS — D72825 Bandemia: Secondary | ICD-10-CM

## 2023-08-10 DIAGNOSIS — I2510 Atherosclerotic heart disease of native coronary artery without angina pectoris: Secondary | ICD-10-CM | POA: Insufficient documentation

## 2023-08-10 DIAGNOSIS — I10 Essential (primary) hypertension: Secondary | ICD-10-CM | POA: Diagnosis present

## 2023-08-10 LAB — CBC
HCT: 41.8 % (ref 36.0–46.0)
Hemoglobin: 11.9 g/dL — ABNORMAL LOW (ref 12.0–15.0)
MCH: 22.8 pg — ABNORMAL LOW (ref 26.0–34.0)
MCHC: 28.5 g/dL — ABNORMAL LOW (ref 30.0–36.0)
MCV: 80.1 fL (ref 80.0–100.0)
Platelets: 473 10*3/uL — ABNORMAL HIGH (ref 150–400)
RBC: 5.22 MIL/uL — ABNORMAL HIGH (ref 3.87–5.11)
RDW: 17.5 % — ABNORMAL HIGH (ref 11.5–15.5)
WBC: 14.6 10*3/uL — ABNORMAL HIGH (ref 4.0–10.5)
nRBC: 0 % (ref 0.0–0.2)

## 2023-08-10 LAB — CBG MONITORING, ED
Glucose-Capillary: 321 mg/dL — ABNORMAL HIGH (ref 70–99)
Glucose-Capillary: 266 mg/dL — ABNORMAL HIGH (ref 70–99)

## 2023-08-10 LAB — LACTIC ACID, PLASMA: Lactic Acid, Venous: 1.6 mmol/L (ref 0.5–1.9)

## 2023-08-10 LAB — BASIC METABOLIC PANEL
Anion gap: 14 (ref 5–15)
BUN: 15 mg/dL (ref 6–20)
CO2: 16 mmol/L — ABNORMAL LOW (ref 22–32)
Calcium: 8.6 mg/dL — ABNORMAL LOW (ref 8.9–10.3)
Chloride: 101 mmol/L (ref 98–111)
Creatinine, Ser: 0.55 mg/dL (ref 0.44–1.00)
GFR, Estimated: >60 mL/min (ref >60)
Glucose, Bld: 360 mg/dL — ABNORMAL HIGH (ref 70–99)
Potassium: 3.8 mmol/L (ref 3.5–5.1)
Sodium: 131 mmol/L — ABNORMAL LOW (ref 135–145)

## 2023-08-10 LAB — TROPONIN I (HIGH SENSITIVITY)
Troponin I (High Sensitivity): 21 ng/L — ABNORMAL HIGH (ref <18)
Troponin I (High Sensitivity): 24 ng/L — ABNORMAL HIGH (ref <18)
Troponin I (High Sensitivity): 27 ng/L — ABNORMAL HIGH (ref <18)

## 2023-08-10 LAB — GLUCOSE, CAPILLARY
Glucose-Capillary: 283 mg/dL — ABNORMAL HIGH (ref 70–99)
Glucose-Capillary: 183 mg/dL — ABNORMAL HIGH (ref 70–99)

## 2023-08-10 LAB — BRAIN NATRIURETIC PEPTIDE: B Natriuretic Peptide: 280.9 pg/mL — ABNORMAL HIGH (ref 0.0–100.0)

## 2023-08-10 LAB — HCG, QUANTITATIVE, PREGNANCY: hCG, Beta Chain, Quant, S: 6 m[IU]/mL — ABNORMAL HIGH (ref <5)

## 2023-08-10 LAB — CK: Total CK: 38 U/L (ref 38–234)

## 2023-08-10 MED ORDER — LACTATED RINGERS IV BOLUS
500.0000 mL | Freq: Once | INTRAVENOUS | Status: AC
  Administered 2023-08-10: 500 mL via INTRAVENOUS

## 2023-08-10 MED ORDER — MORPHINE SULFATE (PF) 4 MG/ML IV SOLN
6.0000 mg | Freq: Once | INTRAVENOUS | Status: AC
  Administered 2023-08-10: 6 mg via INTRAVENOUS
  Filled 2023-08-10: qty 2

## 2023-08-10 MED ORDER — ATORVASTATIN CALCIUM 20 MG PO TABS
80.0000 mg | ORAL_TABLET | Freq: Every day | ORAL | Status: DC
  Administered 2023-08-11: 80 mg via ORAL
  Filled 2023-08-10: qty 4

## 2023-08-10 MED ORDER — MORPHINE SULFATE (PF) 2 MG/ML IV SOLN: 2.0000 mg | Freq: Once | INTRAVENOUS | Status: DC

## 2023-08-10 MED ORDER — DILTIAZEM HCL ER COATED BEADS 120 MG PO CP24
120.0000 mg | ORAL_CAPSULE | Freq: Every day | ORAL | Status: DC
  Administered 2023-08-11: 120 mg via ORAL
  Filled 2023-08-10: qty 1

## 2023-08-10 MED ORDER — ASPIRIN 81 MG PO CHEW
324.0000 mg | CHEWABLE_TABLET | Freq: Once | ORAL | Status: AC
  Administered 2023-08-10: 324 mg via ORAL
  Filled 2023-08-10: qty 4

## 2023-08-10 MED ORDER — LORAZEPAM 2 MG/ML IJ SOLN
2.0000 mg | Freq: Once | INTRAMUSCULAR | Status: AC
  Administered 2023-08-10: 2 mg via INTRAVENOUS
  Filled 2023-08-10: qty 1

## 2023-08-10 MED ORDER — IOHEXOL 350 MG/ML SOLN
100.0000 mL | Freq: Once | INTRAVENOUS | Status: AC | PRN
  Administered 2023-08-10: 100 mL via INTRAVENOUS

## 2023-08-10 MED ORDER — NITROGLYCERIN 2 % TD OINT
1.0000 [in_us] | TOPICAL_OINTMENT | TRANSDERMAL | Status: DC
  Filled 2023-08-10: qty 1

## 2023-08-10 MED ORDER — LORAZEPAM 2 MG/ML IJ SOLN
1.0000 mg | Freq: Four times a day (QID) | INTRAMUSCULAR | Status: DC | PRN
  Administered 2023-08-10: 1 mg via INTRAVENOUS
  Filled 2023-08-10: qty 1

## 2023-08-10 MED ORDER — ASPIRIN 81 MG PO TBEC
81.0000 mg | DELAYED_RELEASE_TABLET | Freq: Every day | ORAL | Status: DC
  Administered 2023-08-11: 81 mg via ORAL
  Filled 2023-08-10: qty 1

## 2023-08-10 MED ORDER — PRASUGREL HCL 10 MG PO TABS
10.0000 mg | ORAL_TABLET | Freq: Every day | ORAL | Status: DC
  Administered 2023-08-11: 10 mg via ORAL
  Filled 2023-08-10: qty 1

## 2023-08-10 MED ORDER — MIDAZOLAM HCL 2 MG/2ML IJ SOLN
1.0000 mg | Freq: Once | INTRAMUSCULAR | Status: AC
  Administered 2023-08-10: 1 mg via INTRAVENOUS
  Filled 2023-08-10: qty 2

## 2023-08-10 MED ORDER — ENOXAPARIN SODIUM 60 MG/0.6ML IJ SOSY
0.5000 mg/kg | PREFILLED_SYRINGE | INTRAMUSCULAR | Status: DC
  Administered 2023-08-10: 55 mg via SUBCUTANEOUS
  Filled 2023-08-10: qty 0.6

## 2023-08-10 MED ORDER — METOCLOPRAMIDE HCL 5 MG/ML IJ SOLN
5.0000 mg | Freq: Four times a day (QID) | INTRAMUSCULAR | Status: DC
  Administered 2023-08-10 – 2023-08-11 (×4): 5 mg via INTRAVENOUS
  Filled 2023-08-10 (×4): qty 2

## 2023-08-10 MED ORDER — ONDANSETRON HCL 4 MG/2ML IJ SOLN: 4.0000 mg | Freq: Three times a day (TID) | INTRAMUSCULAR | Status: DC | PRN

## 2023-08-10 MED ORDER — ENOXAPARIN SODIUM 40 MG/0.4ML IJ SOSY: 40.0000 mg | PREFILLED_SYRINGE | INTRAMUSCULAR | Status: DC

## 2023-08-10 MED ORDER — PANTOPRAZOLE SODIUM 40 MG PO TBEC
40.0000 mg | DELAYED_RELEASE_TABLET | Freq: Every day | ORAL | Status: DC
  Administered 2023-08-10 – 2023-08-11 (×2): 40 mg via ORAL
  Filled 2023-08-10 (×2): qty 1

## 2023-08-10 MED ORDER — MORPHINE SULFATE (PF) 4 MG/ML IV SOLN: 4.0000 mg | Freq: Once | INTRAVENOUS | Status: DC

## 2023-08-10 MED ORDER — INSULIN ASPART 100 UNIT/ML IJ SOLN
5.0000 [IU] | Freq: Once | INTRAMUSCULAR | Status: AC
  Administered 2023-08-10: 5 [IU] via INTRAVENOUS
  Filled 2023-08-10: qty 1

## 2023-08-10 MED ORDER — ACETAMINOPHEN 325 MG PO TABS
650.0000 mg | ORAL_TABLET | Freq: Four times a day (QID) | ORAL | Status: DC | PRN
Start: 2023-08-10 — End: 2023-08-12
  Administered 2023-08-11: 650 mg via ORAL
  Filled 2023-08-10 (×2): qty 2

## 2023-08-10 MED ORDER — HYDROMORPHONE HCL 1 MG/ML IJ SOLN
0.5000 mg | Freq: Four times a day (QID) | INTRAMUSCULAR | Status: DC | PRN
  Administered 2023-08-11: 0.5 mg via INTRAVENOUS
  Filled 2023-08-10: qty 0.5

## 2023-08-10 MED ORDER — SODIUM CHLORIDE 0.9% FLUSH
3.0000 mL | Freq: Two times a day (BID) | INTRAVENOUS | Status: DC
  Administered 2023-08-10 – 2023-08-11 (×3): 3 mL via INTRAVENOUS

## 2023-08-10 MED ORDER — ESCITALOPRAM OXALATE 10 MG PO TABS
10.0000 mg | ORAL_TABLET | Freq: Every day | ORAL | Status: DC
  Administered 2023-08-11: 10 mg via ORAL
  Filled 2023-08-10: qty 1

## 2023-08-10 MED ORDER — INSULIN ASPART 100 UNIT/ML IJ SOLN
0.0000 [IU] | Freq: Three times a day (TID) | INTRAMUSCULAR | Status: DC
  Administered 2023-08-10: 11 [IU] via SUBCUTANEOUS
  Administered 2023-08-11 (×2): 4 [IU] via SUBCUTANEOUS
  Filled 2023-08-10 (×3): qty 1

## 2023-08-10 MED ORDER — ACETAMINOPHEN 650 MG RE SUPP: 650.0000 mg | Freq: Four times a day (QID) | RECTAL | Status: DC | PRN

## 2023-08-10 NOTE — ED Notes (Addendum)
Agitated, restless, moaning again. Calmer now after morphine

## 2023-08-10 NOTE — Assessment & Plan Note (Addendum)
Patient is endorsing nausea, vomiting and abdominal pain, stating this feels similar to her previous gastroparesis flares.  Per chart review, patient has had gastroparesis for several years now with normal EGD in 2020.  -Telemetry for QTc monitoring - 500 cc bolus - Scheduled Reglan 5 mg QID

## 2023-08-10 NOTE — ED Notes (Signed)
Pt verbalizes, "tubes tied, no sexual activity since last visit to hospital, no change of pregnancy", EDP verbalizes proceed with CT, CT notified.

## 2023-08-10 NOTE — Plan of Care (Signed)

## 2023-08-10 NOTE — ED Notes (Signed)
Pt did not get urine sample when up to b/r. Delay in obtaining urine.

## 2023-08-10 NOTE — ED Provider Notes (Signed)
[facility] Provider Note   Event Date/Time   First MD Initiated Contact with Patient 08/10/23 1037     (approximate)  History   Chest Pain  EM caveat: Severity of presentation, pain or perhaps anxiety  HPI  Karen Ruiz is a 42 y.o. female  PMH inferolateral STEMI status post PCI to distal LCx/OM branches complicated by subacute stent thrombosis, uncontrolled diabetes mellitus, hypertension, hyperlipidemia, and polysubstance abuse   Patient advised that sometime in about 7 AM she started having increasing worsening pain in her chest and abdomen.  She cannot describe it well.  Of note, she is quite hysterical, yelling screaming rolling about in the bed making it somewhat hard to actually obtain a good succinct history.    She denies nausea.  No vomiting.  No diarrhea.  Reports a severe pain kind of everywhere reporting it is in her anterior chest across her upper and full abdomen, she describes that everything hurts.  She is requesting fentanyl or Dilaudid, but she reports it worked before  Physical Exam   Triage Vital Signs: ED Triage Vitals  Encounter Vitals Group     BP 08/10/23 0951 (!) 147/119     Systolic BP Percentile --      Diastolic BP Percentile --      Pulse Rate 08/10/23 0942 88     Resp 08/10/23 0942 (!) 30     Temp 08/10/23 0942 97.8 F (36.6 C)     Temp Source 08/10/23 0942 Axillary     SpO2 08/10/23 0942 96 %     Weight --      Height 08/10/23 0943 6' (1.829 m)     Head Circumference --      Peak Flow --      Pain Score 08/10/23 0943 10     Pain Loc --      Pain Education --      Exclude from Growth Chart --     Most recent vital signs: Vitals:   08/10/23 1415 08/10/23 1430  BP: (!) 144/90 131/81  Pulse: (!) 110 96  Resp:  20  Temp:    SpO2: 96% 95%     General: Awake, somewhat thrashing about sitting up sitting down, moving about reports pain everywhere.  Appears in some type of anxiety or painful distress but  does not appear to have evidence of acute medical decompensation such as respiratory failure shock findings, poor perfusion, altered mental status CV:  Good peripheral perfusion.  Normal tones and rate Resp:  Normal effort.  Clear bilaterally but yelling out frequently Abd:  No distention.  Soft no obvious focal tenderness but reports pain everywhere to palpation of body chest abdomen Other:  Is all extremities well able to sit herself up without difficulty lays down and rolls side-to-side.  No facial droop.   ED Results / Procedures / Treatments   Labs (all labs ordered are listed, but only abnormal results are displayed) Labs Reviewed  BASIC METABOLIC PANEL - Abnormal; Notable for the following components:      Result Value   Sodium 131 (*)    CO2 16 (*)    Glucose, Bld 360 (*)    Calcium 8.6 (*)    All other components within normal limits  CBC - Abnormal; Notable for the following components:   WBC 14.6 (*)    RBC 5.22 (*)    Hemoglobin 11.9 (*)    MCH 22.8 (*)    MCHC 28.5 (*)  RDW 17.5 (*)    Platelets 473 (*)    All other components within normal limits  BRAIN NATRIURETIC PEPTIDE - Abnormal; Notable for the following components:   B Natriuretic Peptide 280.9 (*)    All other components within normal limits  HCG, QUANTITATIVE, PREGNANCY - Abnormal; Notable for the following components:   hCG, Beta Chain, Quant, S 6 (*)    All other components within normal limits  CBG MONITORING, ED - Abnormal; Notable for the following components:   Glucose-Capillary 321 (*)    All other components within normal limits  CBG MONITORING, ED - Abnormal; Notable for the following components:   Glucose-Capillary 266 (*)    All other components within normal limits  TROPONIN I (HIGH SENSITIVITY) - Abnormal; Notable for the following components:   Troponin I (High Sensitivity) 21 (*)    All other components within normal limits  TROPONIN I (HIGH SENSITIVITY) - Abnormal; Notable for the  following components:   Troponin I (High Sensitivity) 24 (*)    All other components within normal limits  URINE DRUG SCREEN, QUALITATIVE ([facility] ONLY)  CK  LACTIC ACID, PLASMA  CBG MONITORING, ED  POC URINE PREG, ED  CBG MONITORING, ED  CBG MONITORING, ED  CBG MONITORING, ED  CBG MONITORING, ED  CBG MONITORING, ED  CBG MONITORING, ED   Labs notable for elevated troponin minimally, much improved from previous admission.  Leukocytosis white count of 14,000.  Mild anemia.  Patient glucose also 360 with reduced CO2 but normal anion gap.  No clear evidence for DKA at this time.  I will give a dose of insulin.  Will continue to follow her glucose.  She is also also very anxious questionably hyperventilating possibly driving down her CO2.  EKG  And interpreted by me at 950 heart rate 110 QRS 90 QTc 470 Incomplete right bundle branch block.  Artifact.  No STEMI.  Mild nonspecific T wave abnormality suspect likely artifactual.  No obvious frank ischemia     RADIOLOGY CT Angio Chest/Abd/Pel for Dissection W and/or Wo Contrast  Result Date: 08/10/2023 CLINICAL DATA:  Acute aortic syndrome. Diffuse anterior chest and mid abdominal pain. Recent myocardial infarction with cardiac stent placement. EXAM: CT ANGIOGRAPHY CHEST, ABDOMEN AND PELVIS TECHNIQUE: Non-contrast CT of the chest was initially obtained. Multidetector CT imaging through the chest, abdomen and pelvis was performed using the standard protocol during bolus administration of intravenous contrast. Multiplanar reconstructed images and MIPs were obtained and reviewed to evaluate the vascular anatomy. RADIATION DOSE REDUCTION: This exam was performed according to the departmental dose-optimization program which includes automated exposure control, adjustment of the mA and/or kV according to patient size and/or use of iterative reconstruction technique. CONTRAST:  OMNIPAQUE IOHEXOL 350 MG/ML SOLN COMPARISON:  07/29/2023 FINDINGS: CTA  CHEST FINDINGS Cardiovascular: Pre contrast imaging shows no hyperdense crescent in the wall of the thoracic aorta to suggest the presence of an acute intramural hematoma. The heart size is normal. No substantial pericardial effusion. Coronary artery calcification is evident. No thoracic aortic aneurysm. No dissection of the thoracic aorta. Mediastinum/Nodes: No mediastinal lymphadenopathy. There is no hilar lymphadenopathy. The esophagus has normal imaging features. There is no axillary lymphadenopathy. Lungs/Pleura: No suspicious pulmonary nodule or mass. No focal airspace consolidation. No pleural effusion. Musculoskeletal: No worrisome lytic or sclerotic osseous abnormality. Review of the MIP images confirms the above findings. CTA ABDOMEN AND PELVIS FINDINGS VASCULAR Aorta: Normal caliber aorta without aneurysm, dissection, vasculitis or significant stenosis. Celiac: Patent without evidence  of aneurysm, dissection, vasculitis or significant stenosis. SMA: Patent without evidence of aneurysm, dissection, vasculitis or significant stenosis. Renals: Both renal arteries are patent without evidence of aneurysm, dissection, vasculitis, fibromuscular dysplasia or significant stenosis. IMA: Patent without evidence of aneurysm, dissection, vasculitis or significant stenosis. Inflow: Patent without evidence of aneurysm, dissection, vasculitis or significant stenosis. Veins: No obvious venous abnormality within the limitations of this arterial phase study. Review of the MIP images confirms the above findings. NON-VASCULAR Hepatobiliary: No suspicious focal abnormality within the liver parenchyma. 2.7 cm water density structure adjacent to the heart and anterior hepatic dome shows no perceptible rim. 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 Subtle layering tiny gallstones evident (143/4). No gallbladder wall thickening, or pericholecystic fluid. No intrahepatic or extrahepatic biliary dilation. Pancreas: No focal  mass lesion. No dilatation of the main duct. No intraparenchymal cyst. No peripancreatic edema. Spleen: No splenomegaly. No suspicious focal mass lesion. Adrenals/Urinary Tract: No adrenal nodule or mass. Probable scarring upper pole right kidney. Left kidney unremarkable. No evidence for hydroureter. The urinary bladder appears normal for the degree of distention. Stomach/Bowel: Stomach is unremarkable. No gastric wall thickening. No evidence of outlet obstruction. Duodenum is normally positioned as is the ligament of Treitz. No small bowel wall thickening. No small bowel dilatation. The terminal ileum is normal. The appendix is normal. No gross colonic mass. No colonic wall thickening. Lymphatic: There is no gastrohepatic or hepatoduodenal ligament lymphadenopathy. No retroperitoneal or mesenteric lymphadenopathy. No pelvic sidewall lymphadenopathy. Reproductive: There is no adnexal mass. Right ovary accentuated by immediately adjacent small bowel loops. Other: No intraperitoneal free fluid. Musculoskeletal: No worrisome lytic or sclerotic osseous abnormality. Review of the MIP images confirms the above findings. IMPRESSION: 1. No acute findings in the chest, abdomen, or pelvis. Specifically, no evidence for thoracoabdominal aortic aneurysm or dissection. 2. 2.7 cm water density structure adjacent to the heart and anterior hepatic dome shows no perceptible rim. 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 Stable. 3. Cholelithiasis. 4. Probable scarring upper pole right kidney. Electronically Signed   By: Kennith Center M.D.   On: 08/10/2023 13:47   DG Chest Portable 1 View  Result Date: 08/10/2023 CLINICAL DATA:  Chest pain. EXAM: PORTABLE CHEST 1 VIEW COMPARISON:  Feb 19, 2023. FINDINGS: The heart size and mediastinal contours are within normal limits. Both lungs are clear. The visualized skeletal structures are unremarkable. IMPRESSION: No active disease. Electronically Signed   By: Lupita Raider M.D.    On: 08/10/2023 11:25        PROCEDURES:  Critical Care performed: No  Procedures   MEDICATIONS ORDERED IN ED: Medications  sodium chloride flush (NS) 0.9 % injection 3 mL (has no administration in time range)  acetaminophen (TYLENOL) tablet 650 mg (has no administration in time range)    Or  acetaminophen (TYLENOL) suppository 650 mg (has no administration in time range)  enoxaparin (LOVENOX) injection 55 mg (has no administration in time range)  insulin aspart (novoLOG) injection 5 Units (5 Units Intravenous Given 08/10/23 1101)  LORazepam (ATIVAN) injection 2 mg (2 mg Intravenous Given 08/10/23 1101)  aspirin chewable tablet 324 mg (324 mg Oral Given 08/10/23 1158)  morphine (PF) 4 MG/ML injection 6 mg (6 mg Intravenous Given 08/10/23 1159)  iohexol (OMNIPAQUE) 350 MG/ML injection 100 mL (100 mLs Intravenous Contrast Given 08/10/23 1317)  midazolam (VERSED) injection 1 mg (1 mg Intravenous Given 08/10/23 1352)     IMPRESSION / MDM / ASSESSMENT AND PLAN / ED COURSE  I  reviewed the triage vital signs and the nursing notes.                              ----------------------------------------- 10:55 AM on 08/10/2023 ----------------------------------------- Patient seems very anxious, yelling and screaming, fidgeting about the bed, difficult to provide care and full evaluation given the degree of her either pain anxiety or combination of both thereof.  Discussed with patient we will medicate with Ativan 2 mg IV to help with calming and hopefully facilitation of further care.    Differential diagnosis includes, but is not limited to, ACS, angina, NSTEMI, recurrent gastroparesis, cholecystitis cholelithiasis pancreatitis, acute intra-abdominal causation aortic dissection aneurysm coronary artery dissection etc.    Patient's presentation is most consistent with acute complicated illness / injury requiring diagnostic workup.   The patient is on the cardiac monitor to evaluate for  evidence of arrhythmia and/or significant heart rate changes.   Clinical Course as of 08/10/23 1531  Sat Aug 10, 2023  1236 hCG noted 6.  Patient notes that she is not sexually active and has a previous tubal ligation.  Not perceived to have a high risk of pregnancy [MQ]    Clinical Course User Index [MQ] Sharyn Creamer, MD   ----------------------------------------- 11:47 AM on 08/10/2023 ----------------------------------------- Patient is resting much more comfortably at this time.  Aspirin ordered.  She is much calmer.  She has had previous workups of similar nature, none clearly revealing but given her elevated white count severity of pain reported as well as the nature of the pain being both thoracic and abdominal as she reports I have ordered CT angiography to exclude dissection and evaluate for further acute intrathoracic or abdominal processes.  ----------------------------------------- 11:50 AM on 08/10/2023 ----------------------------------------- Patient pain seems to be recurring now.  Aspirin and morphine ordered.  She reports the pain is all over.  Was resting more comfortably but now her pain seems to have escalated once again the last minute or 2.  Morphine has been ordered.  Awaiting imaging   ----------------------------------------- 3:30 PM on 08/10/2023 ----------------------------------------- Patient admitted to the hospitalist service due to intractable nature of discomfort and pain.  Thus far cardiac workup reassuring, imaging studies reassuring.  Questionable if due to gastroparesis or other recurrent condition, however she has notable cardiac risk factors with recent STEMI.  Discussed with patient provide further consultation with the hospitalist and observation anticipated  Patient comfortable plan for admission.  Consulted with patient accepted to hospitalist by Dr. Huel Cote   FINAL CLINICAL IMPRESSION(S) / ED DIAGNOSES   Final diagnoses:  Chest pain,  moderate coronary artery risk  Intractable abdominal pain     Rx / DC Orders   ED Discharge Orders     None        Note:  This document was prepared using Dragon voice recognition software and may include unintentional dictation errors.   Sharyn Creamer, MD 08/10/23 1531

## 2023-08-10 NOTE — Assessment & Plan Note (Signed)
Patient endorses cannabinoid use yesterday evening but no recent methamphetamine or cocaine use.  - UDS pending

## 2023-08-10 NOTE — ED Notes (Signed)
ED TO INPATIENT HANDOFF REPORT  ED Nurse Name and Phone #: Al Corpus, RN [phone]  S Name/Age/Gender Geroge Baseman Ruiz 42 y.o. female Room/Bed: ED13A/ED13A  Code Status   Code Status: Full Code  Home/SNF/Other Home Patient oriented to: self, place, time, and situation Is this baseline? Yes   Triage Complete: Triage complete  Chief Complaint Gastroparesis [K31.84]  Triage Note Pt in via EMS from home with c/o cp. EMS reports pt with chest and abd pain. EMS was not able to get BP well due to patient movement. Pt takes diltiazem. Pt was given of fentanyl. #20g to right AC. CBG 411, pt was given 4mg  of zofran. Pt has 1.5 in of nitropaste to right upper chest. Started at 7 am gradual.   Pt to ED via [facility]. See first nurse note. Pt thrashing herself around in the wheelchair and yelling at this RN. EKG given to Dr. Fanny Bien.   Allergies Allergies  Allergen Reactions   Quinolones Other (See Comments)    Level of Care/Admitting Diagnosis ED Disposition     ED Disposition  Admit   Condition  --   Comment  Hospital Area: [facility] [[id number]]  Level of Care: Telemetry Medical [104]  Covid Evaluation: Asymptomatic - no recent exposure (last 10 days) testing not required  Diagnosis: Gastroparesis [536.3.ICD-9-CM]  Admitting Physician: Verdene Lennert [[id number]]  Attending Physician: Verdene Lennert [[id number]]          B Medical/Surgery History Past Medical History:  Diagnosis Date   Acute ST elevation myocardial infarction (STEMI) involving other coronary artery of inferior wall (HCC) 07/21/2023   Barrett esophagus    Diabetes mellitus without complication (HCC)    Gastroparesis    Migraines    MRSA (methicillin resistant staph aureus) culture positive    2009   Obesity    ST elevation myocardial infarction (STEMI) (HCC) 05/02/2021   Type 2 diabetes mellitus (HCC) 09/06/2016   Last Assessment & Plan:    Ordered new glucometer and test  strips  Last Assessment & Plan:    Ordered new glucometer and test strips     Past Surgical History:  Procedure Laterality Date   CARDIAC CATHETERIZATION     CESAREAN SECTION     CORONARY STENT INTERVENTION N/A 05/02/2021   Procedure: CORONARY STENT INTERVENTION;  Surgeon: Yvonne Kendall, MD;  Location: [facility];  Service: Cardiovascular;  Laterality: N/A;   CORONARY ULTRASOUND/IVUS N/A 05/02/2021   Procedure: Intravascular Ultrasound/IVUS;  Surgeon: Yvonne Kendall, MD;  Location: [facility];  Service: Cardiovascular;  Laterality: N/A;   CORONARY ULTRASOUND/IVUS N/A 07/23/2023   Procedure: Coronary Ultrasound/IVUS;  Surgeon: Iran Ouch, MD;  Location: [facility];  Service: Cardiovascular;  Laterality: N/A;   CORONARY/GRAFT ACUTE MI REVASCULARIZATION N/A 07/20/2023   Procedure: Coronary/Graft Acute MI Revascularization;  Surgeon: Iran Ouch, MD;  Location: [facility];  Service: Cardiovascular;  Laterality: N/A;   CORONARY/GRAFT ACUTE MI REVASCULARIZATION N/A 07/23/2023   Procedure: Coronary/Graft Acute MI Revascularization;  Surgeon: Iran Ouch, MD;  Location: [facility];  Service: Cardiovascular;  Laterality: N/A;   ESOPHAGOGASTRODUODENOSCOPY N/A 07/28/2019   Procedure: ESOPHAGOGASTRODUODENOSCOPY (EGD);  Surgeon: Toney Reil, MD;  Location: [facility];  Service: Gastroenterology;  Laterality: N/A;   LEFT HEART CATH AND CORONARY ANGIOGRAPHY N/A 05/02/2021   Procedure: LEFT HEART CATH AND CORONARY ANGIOGRAPHY;  Surgeon: Yvonne Kendall, MD;  Location: [facility];  Service: Cardiovascular;  Laterality: N/A;  LEFT HEART CATH AND CORONARY ANGIOGRAPHY N/A 07/20/2023   Procedure: LEFT HEART CATH AND CORONARY ANGIOGRAPHY;  Surgeon: Iran Ouch, MD;  Location: [facility];  Service: Cardiovascular;  Laterality: N/A;   LEFT HEART CATH AND CORONARY ANGIOGRAPHY N/A 07/23/2023   Procedure:  LEFT HEART CATH AND CORONARY ANGIOGRAPHY;  Surgeon: Iran Ouch, MD;  Location: [facility];  Service: Cardiovascular;  Laterality: N/A;     A IV Location/Drains/Wounds Patient Lines/Drains/Airways Status     Active Line/Drains/Airways     Name Placement date Placement time Site Days   Peripheral IV 08/10/23 20 G Anterior;Proximal;Right Forearm 08/10/23  0952  Forearm  less than 1            Intake/Output Last 24 hours No intake or output data in the 24 hours ending 08/10/23 1545  Labs/Imaging Results for orders placed or performed during the hospital encounter of 08/10/23 (from the past 48 hour(s))  Basic metabolic panel     Status: Abnormal   Collection Time: 08/10/23  9:56 AM  Result Value Ref Range   Sodium 131 (L) 135 - 145 mmol/L   Potassium 3.8 3.5 - 5.1 mmol/L   Chloride 101 98 - 111 mmol/L   CO2 16 (L) 22 - 32 mmol/L   Glucose, Bld 360 (H) 70 - 99 mg/dL    Comment: Glucose reference range applies only to samples taken after fasting for at least 8 hours.   BUN 15 6 - 20 mg/dL   Creatinine, Ser 2.84 0.44 - 1.00 mg/dL   Calcium 8.6 (L) 8.9 - 10.3 mg/dL   GFR, Estimated >13 >24 mL/min    Comment: (NOTE) Calculated using the CKD-EPI Creatinine Equation (2021)    Anion gap 14 5 - 15    Comment: Performed at [facility], [address] [place], [place] [address]  CBC     Status: Abnormal   Collection Time: 08/10/23  9:56 AM  Result Value Ref Range   WBC 14.6 (H) 4.0 - 10.5 K/uL   RBC 5.22 (H) 3.87 - 5.11 MIL/uL   Hemoglobin 11.9 (L) 12.0 - 15.0 g/dL   HCT 72.5 36.6 - 44.0 %   MCV 80.1 80.0 - 100.0 fL   MCH 22.8 (L) 26.0 - 34.0 pg   MCHC 28.5 (L) 30.0 - 36.0 g/dL   RDW 34.7 (H) 42.5 - 95.6 %   Platelets 473 (H) 150 - 400 K/uL   nRBC 0.0 0.0 - 0.2 %    Comment: Performed at [facility], [address], [place], [place] [address]  Troponin I (High Sensitivity)     Status: Abnormal   Collection Time: 08/10/23  9:56 AM   Result Value Ref Range   Troponin I (High Sensitivity) 21 (H) <18 ng/L    Comment: (NOTE) Elevated high sensitivity troponin I (hsTnI) values and significant  changes across serial measurements may suggest ACS but many other  chronic and acute conditions are known to elevate hsTnI results.  Refer to the "Links" section for chest pain algorithms and additional  guidance. Performed at [facility], [address] [place], [place] [address]   POC CBG, ED     Status: Abnormal   Collection Time: 08/10/23 11:37 AM  Result Value Ref Range   Glucose-Capillary 321 (H) 70 - 99 mg/dL    Comment: Glucose reference range applies only to samples taken after fasting for at least 8 hours.  Brain natriuretic peptide  Status: Abnormal   Collection Time: 08/10/23 11:40 AM  Result Value Ref Range   B Natriuretic Peptide 280.9 (H) 0.0 - 100.0 pg/mL    Comment: Performed at [facility], [address] [place], [place] [address]  Troponin I (High Sensitivity)     Status: Abnormal   Collection Time: 08/10/23 11:40 AM  Result Value Ref Range   Troponin I (High Sensitivity) 24 (H) <18 ng/L    Comment: (NOTE) Elevated high sensitivity troponin I (hsTnI) values and significant  changes across serial measurements may suggest ACS but many other  chronic and acute conditions are known to elevate hsTnI results.  Refer to the "Links" section for chest pain algorithms and additional  guidance. Performed at [facility], [address] [place], [place] [address]   hCG, quantitative, pregnancy     Status: Abnormal   Collection Time: 08/10/23 11:40 AM  Result Value Ref Range   hCG, Beta Chain, Quant, S 6 (H) <5 mIU/mL    Comment:          GEST. AGE      CONC.  (mIU/mL)   <=1 WEEK        5 - 50     2 WEEKS       50 - 500     3 WEEKS       100 - 10,000     4 WEEKS     1,000 - 30,000     5 WEEKS     3,500 - 115,000   6-8 WEEKS     12,000 - 270,000    12 WEEKS     15,000 -  220,000        FEMALE AND NON-PREGNANT FEMALE:     LESS THAN 5 mIU/mL Performed at [facility], [address] [place], [place] [address]   POC CBG, ED     Status: Abnormal   Collection Time: 08/10/23  1:55 PM  Result Value Ref Range   Glucose-Capillary 266 (H) 70 - 99 mg/dL    Comment: Glucose reference range applies only to samples taken after fasting for at least 8 hours.  CK     Status: None   Collection Time: 08/10/23  2:50 PM  Result Value Ref Range   Total CK 38 38 - 234 U/L    Comment: Performed at [facility], [address] [place], [place] [address]   CT Angio Chest/Abd/Pel for Dissection W and/or Wo Contrast  Result Date: 08/10/2023 CLINICAL DATA:  Acute aortic syndrome. Diffuse anterior chest and mid abdominal pain. Recent myocardial infarction with cardiac stent placement. EXAM: CT ANGIOGRAPHY CHEST, ABDOMEN AND PELVIS TECHNIQUE: Non-contrast CT of the chest was initially obtained. Multidetector CT imaging through the chest, abdomen and pelvis was performed using the standard protocol during bolus administration of intravenous contrast. Multiplanar reconstructed images and MIPs were obtained and reviewed to evaluate the vascular anatomy. RADIATION DOSE REDUCTION: This exam was performed according to the departmental dose-optimization program which includes automated exposure control, adjustment of the mA and/or kV according to patient size and/or use of iterative reconstruction technique. CONTRAST:  OMNIPAQUE IOHEXOL 350 MG/ML SOLN COMPARISON:  07/29/2023 FINDINGS: CTA CHEST FINDINGS Cardiovascular: Pre contrast imaging shows no hyperdense crescent in the wall of the thoracic aorta to suggest the presence of an acute intramural hematoma. The heart size is normal. No substantial pericardial effusion. Coronary artery calcification is evident. No thoracic aortic aneurysm. No dissection of the thoracic aorta. Mediastinum/Nodes:  No mediastinal lymphadenopathy.  There is no hilar lymphadenopathy. The esophagus has normal imaging features. There is no axillary lymphadenopathy. Lungs/Pleura: No suspicious pulmonary nodule or mass. No focal airspace consolidation. No pleural effusion. Musculoskeletal: No worrisome lytic or sclerotic osseous abnormality. Review of the MIP images confirms the above findings. CTA ABDOMEN AND PELVIS FINDINGS VASCULAR Aorta: Normal caliber aorta without aneurysm, dissection, vasculitis or significant stenosis. Celiac: Patent without evidence of aneurysm, dissection, vasculitis or significant stenosis. SMA: Patent without evidence of aneurysm, dissection, vasculitis or significant stenosis. Renals: Both renal arteries are patent without evidence of aneurysm, dissection, vasculitis, fibromuscular dysplasia or significant stenosis. IMA: Patent without evidence of aneurysm, dissection, vasculitis or significant stenosis. Inflow: Patent without evidence of aneurysm, dissection, vasculitis or significant stenosis. Veins: No obvious venous abnormality within the limitations of this arterial phase study. Review of the MIP images confirms the above findings. NON-VASCULAR Hepatobiliary: No suspicious focal abnormality within the liver parenchyma. 2.7 cm water density structure adjacent to the heart and anterior hepatic dome shows no perceptible rim. 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 Subtle layering tiny gallstones evident (143/4). No gallbladder wall thickening, or pericholecystic fluid. No intrahepatic or extrahepatic biliary dilation. Pancreas: No focal mass lesion. No dilatation of the main duct. No intraparenchymal cyst. No peripancreatic edema. Spleen: No splenomegaly. No suspicious focal mass lesion. Adrenals/Urinary Tract: No adrenal nodule or mass. Probable scarring upper pole right kidney. Left kidney unremarkable. No evidence for hydroureter. The urinary bladder appears normal for the degree of distention. Stomach/Bowel: Stomach is  unremarkable. No gastric wall thickening. No evidence of outlet obstruction. Duodenum is normally positioned as is the ligament of Treitz. No small bowel wall thickening. No small bowel dilatation. The terminal ileum is normal. The appendix is normal. No gross colonic mass. No colonic wall thickening. Lymphatic: There is no gastrohepatic or hepatoduodenal ligament lymphadenopathy. No retroperitoneal or mesenteric lymphadenopathy. No pelvic sidewall lymphadenopathy. Reproductive: There is no adnexal mass. Right ovary accentuated by immediately adjacent small bowel loops. Other: No intraperitoneal free fluid. Musculoskeletal: No worrisome lytic or sclerotic osseous abnormality. Review of the MIP images confirms the above findings. IMPRESSION: 1. No acute findings in the chest, abdomen, or pelvis. Specifically, no evidence for thoracoabdominal aortic aneurysm or dissection. 2. 2.7 cm water density structure adjacent to the heart and anterior hepatic dome shows no perceptible rim. 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 Stable. 3. Cholelithiasis. 4. Probable scarring upper pole right kidney. Electronically Signed   By: Kennith Center M.D.   On: 08/10/2023 13:47   DG Chest Portable 1 View  Result Date: 08/10/2023 CLINICAL DATA:  Chest pain. EXAM: PORTABLE CHEST 1 VIEW COMPARISON:  Feb 19, 2023. FINDINGS: The heart size and mediastinal contours are within normal limits. Both lungs are clear. The visualized skeletal structures are unremarkable. IMPRESSION: No active disease. Electronically Signed   By: Lupita Raider M.D.   On: 08/10/2023 11:25    Pending Labs Unresulted Labs (From admission, onward)     Start     Ordered   08/10/23 1447  Lactic acid, plasma  (Lactic Acid)  ONCE - STAT,   STAT        08/10/23 1446   08/10/23 1051  Urine Drug Screen, Qualitative ([facility] only)  Once,   URGENT        08/10/23 1050            Vitals/Pain Today's Vitals   08/10/23 1357 08/10/23 1400 08/10/23 1415  08/10/23 1430  BP:  125/64 Marland Kitchen)  144/90 131/81  Pulse:  98 (!) 110 96  Resp:    20  Temp:      TempSrc:      SpO2:  96% 96% 95%  Weight:      Height:      PainSc: Asleep   Asleep    Isolation Precautions No active isolations  Medications Medications  sodium chloride flush (NS) 0.9 % injection 3 mL (has no administration in time range)  acetaminophen (TYLENOL) tablet 650 mg (has no administration in time range)    Or  acetaminophen (TYLENOL) suppository 650 mg (has no administration in time range)  enoxaparin (LOVENOX) injection 55 mg (has no administration in time range)  metoCLOPramide (REGLAN) injection 5 mg (has no administration in time range)  lactated ringers bolus 500 mL (has no administration in time range)  insulin aspart (novoLOG) injection 0-20 Units (has no administration in time range)  insulin aspart (novoLOG) injection 5 Units (5 Units Intravenous Given 08/10/23 1101)  LORazepam (ATIVAN) injection 2 mg (2 mg Intravenous Given 08/10/23 1101)  aspirin chewable tablet 324 mg (324 mg Oral Given 08/10/23 1158)  morphine (PF) 4 MG/ML injection 6 mg (6 mg Intravenous Given 08/10/23 1159)  iohexol (OMNIPAQUE) 350 MG/ML injection 100 mL (100 mLs Intravenous Contrast Given 08/10/23 1317)  midazolam (VERSED) injection 1 mg (1 mg Intravenous Given 08/10/23 1352)    Mobility walks     Focused Assessments Cardiac Assessment Handoff:    Lab Results  Component Value Date   CKTOTAL 38 08/10/2023   No results found for: "DDIMER" Does the Patient currently have chest pain? No    R Recommendations: See Admitting Provider Note  Report given to:   Additional Notes: abd pain, h/o similar, BS high, sx c/w gastroparesis, h/o MI, has been given ASA, morphine, ativan, versed, CT w/o acute findings. Cooperative, at times agitated and restless.

## 2023-08-10 NOTE — ED Notes (Signed)
Family into room, pt calmer, "feels better".

## 2023-08-10 NOTE — Progress Notes (Signed)
PHARMACIST - PHYSICIAN COMMUNICATION  CONCERNING:  Enoxaparin (Lovenox) for DVT Prophylaxis    RECOMMENDATION: Patient was prescribed enoxaprin 40mg  q24 hours for VTE prophylaxis.   Filed Weights   08/10/23 1202  Weight: 111.1 kg (244 lb 14.9 oz)    Body mass index is 33.22 kg/m.  Estimated Creatinine Clearance: 125.1 mL/min (by C-G formula based on SCr of 0.55 mg/dL).   Based on [facility] policy patient is candidate for enoxaparin 0.5mg /kg TBW SQ every 24 hours based on BMI being >30.  DESCRIPTION: Pharmacy has adjusted enoxaparin dose per [facility] policy.  Patient is now receiving enoxaparin 55 mg every 24 hours    Merryl Hacker, PharmD Clinical Pharmacist  08/10/2023 3:28 PM

## 2023-08-10 NOTE — Assessment & Plan Note (Addendum)
History of multivessel CAD s/p multiple PCI's, recent inferolateral STEMI with LCx/OM branches (07/20/2023) complicated by subacute stent thrombosis (07/23/2023). Troponin is minimally elevated and flat. EKG is reassuring, however given high risk history, will ask Cardiology to evaluate.   - Cardiology consulted; appreciate their recommendations - Recheck troponin this evening - EKG as needed for chest pain - Hold off on heparin infusion - Continue home aspirin and prasugrel

## 2023-08-10 NOTE — ED Triage Notes (Addendum)
Pt to ED via [facility]. See first nurse note. Pt thrashing herself around in the wheelchair and yelling at this RN. EKG given to Dr. Fanny Bien.

## 2023-08-10 NOTE — H&P (Addendum)
History and Physical    Patient: Karen Ruiz:[id number] DOB: September 10, 1981 DOA: 08/10/2023 DOS: the patient was seen and examined on 08/10/2023 PCP: Dan Humphreys, MD  Patient coming from: Home  Chief Complaint:  Chief Complaint  Patient presents with   Chest Pain   HPI: Karen Ruiz is a 42 y.o. female with medical history significant of extensive CAD s/p recent inferior lateral STEMI s/p PCI to distal Lcx/OM  branches complicated by subacute stent thrombosis, uncontrolled type 2 diabetes, hypertension, hyperlipidemia, polysubstance abuse who presents to the ED due to chest pain.  History limited by patient's pain and difficulty cooperating.  [name] states that yesterday evening, she smoked some marijuana because usually this helps with her pain symptoms.  Then today, she awoke with nausea, vomiting, diffuse abdominal pain.  She had a little bit of loose stool.  Then the abdominal pain seemed to radiate upwards towards her chest.  She denies any palpitations.  She states that this feels like her typical gastroparesis flares but she was confused by the chest pain.  She states that she has been compliant with her aspirin and prasugrel with her last dose yesterday morning.  She denies any recent methamphetamine or cocaine use.  ED course: On arrival to the ED, patient was hypertensive at 147/119 with heart rate of 88.  She was saturating at 96%.  She was afebrile.  Initial workup notable for WBC of 14.6, hemoglobin of 11.9, platelets 473, sodium 131, glucose 360, bicarb 16, BUN 15, creatinine 0.55 with GFR above 60.  Troponin elevated at 21 with flat trend to 24.  BNP 280.  CT of the chest was obtained with no acute findings.  Patient given Ativan, Versed, aspirin, morphine, and insulin.  TRH contacted for admission.  Review of Systems: As mentioned in the history of present illness. All other systems reviewed and are negative.  Past Medical History:  Diagnosis Date    Acute ST elevation myocardial infarction (STEMI) involving other coronary artery of inferior wall (HCC) 07/21/2023   Barrett esophagus    Diabetes mellitus without complication (HCC)    Gastroparesis    Migraines    MRSA (methicillin resistant staph aureus) culture positive    2009   Obesity    ST elevation myocardial infarction (STEMI) (HCC) 05/02/2021   Type 2 diabetes mellitus (HCC) 09/06/2016   Last Assessment & Plan:    Ordered new glucometer and test strips  Last Assessment & Plan:    Ordered new glucometer and test strips     Past Surgical History:  Procedure Laterality Date   CARDIAC CATHETERIZATION     CESAREAN SECTION     CORONARY STENT INTERVENTION N/A 05/02/2021   Procedure: CORONARY STENT INTERVENTION;  Surgeon: Yvonne Kendall, MD;  Location: [facility] INVASIVE CV LAB;  Service: Cardiovascular;  Laterality: N/A;   CORONARY ULTRASOUND/IVUS N/A 05/02/2021   Procedure: Intravascular Ultrasound/IVUS;  Surgeon: Yvonne Kendall, MD;  Location: [facility] INV[facility]IVE CV [facility];  Service: Cardiovascular;  Laterality: N/A;   CORONARY ULTRASOUND/IVUS N/A 07/23/2023   Procedure: Coronary Ultrasound/IVUS;  Surgeon: Iran Ouch, MD;  Location: [facility] INV[facility]IVE CV [facility];  Service: Cardiovascular;  Laterality: N/A;   CORONARY/GRAFT ACUTE MI REVASCULARIZATION N/A 07/20/2023   Procedure: Coronary/Graft Acute MI Revascularization;  Surgeon: Iran Ouch, MD;  Location: [facility] INV[facility]IVE CV [facility];  Service: Cardiovascular;  Laterality: N/A;   CORONARY/GRAFT ACUTE MI REVASCULARIZATION N/A 07/23/2023   Procedure: Coronary/Graft Acute MI Revascularization;  Surgeon: Iran Ouch, MD;  Location: [facility]  INVASIVE CV LAB;  Service: Cardiovascular;  Laterality: N/A;   ESOPHAGOGASTRODUODENOSCOPY N/A 07/28/2019   Procedure: ESOPHAGOGASTRODUODENOSCOPY (EGD);  Surgeon: Toney Reil, MD;  Location: [facility] ENDOSCOPY;  Service: Gastroenterology;  Laterality: N/A;   LEFT HEART CATH AND CORONARY ANGIOGRAPHY N/A  05/02/2021   Procedure: LEFT HEART CATH AND CORONARY ANGIOGRAPHY;  Surgeon: Yvonne Kendall, MD;  Location: [facility] INVASIVE CV LAB;  Service: Cardiovascular;  Laterality: N/A;   LEFT HEART CATH AND CORONARY ANGIOGRAPHY N/A 07/20/2023   Procedure: LEFT HEART CATH AND CORONARY ANGIOGRAPHY;  Surgeon: Iran Ouch, MD;  Location: [facility] INVASIVE CV LAB;  Service: Cardiovascular;  Laterality: N/A;   LEFT HEART CATH AND CORONARY ANGIOGRAPHY N/A 07/23/2023   Procedure: LEFT HEART CATH AND CORONARY ANGIOGRAPHY;  Surgeon: Iran Ouch, MD;  Location: [facility] INVASIVE CV LAB;  Service: Cardiovascular;  Laterality: N/A;   Social History:  reports that she has been smoking cigarettes. She has never used smokeless tobacco. She reports current alcohol use. She reports current drug use. Drug: Marijuana.  Allergies  Allergen Reactions   Quinolones Other (See Comments)    Family History  Problem Relation Age of Onset   Diabetes Mother    Cancer Mother    Heart failure Mother    Hypertension Mother    Heart attack Father    Diabetes Father    Hypertension Father     Prior to Admission medications   Medication Sig Start Date End Date Taking? Authorizing Provider  aspirin EC 81 MG EC tablet Take 1 tablet (81 mg total) by mouth daily. Swallow whole. 05/04/21   Delfino Lovett, MD  atorvastatin (LIPITOR) 80 MG tablet Take 1 tablet (80 mg total) by mouth daily. 07/22/23 10/20/23  Arnetha Courser, MD  diltiazem (CARDIZEM CD) 120 MG 24 hr capsule Take 1 capsule (120 mg total) by mouth daily. 08/01/23 09/30/23  Tresa Moore, MD  escitalopram (LEXAPRO) 10 MG tablet Take 1 tablet (10 mg total) by mouth daily. 07/22/23 10/20/23  Arnetha Courser, MD  Evolocumab (REPATHA SURECLICK) 140 MG/ML SOAJ Inject 140 mg into the skin every 14 (fourteen) days. 07/31/23   Antonieta Iba, MD  insulin aspart (NOVOLOG) 100 UNIT/ML injection Inject 0-9 Units into the skin 3 (three) times daily with meals. 07/16/19   Katha Hamming, MD  pantoprazole (PROTONIX) 40 MG tablet Take 1 tablet (40 mg total) by mouth daily. PLEASE SCHEDULE OFFICE VISIT FOR FURTHER REFILLS. THANK YOU! 07/22/23 10/20/23  Arnetha Courser, MD  prasugrel (EFFIENT) 10 MG TABS tablet Take 1 tablet (10 mg total) by mouth daily. 08/01/23 09/30/23  Tresa Moore, MD  promethazine (PHENERGAN) 25 MG suppository Place 1 suppository (25 mg total) rectally every 8 (eight) hours as needed for refractory nausea / vomiting. 06/29/21   Shaune Pollack, MD    Physical Exam: Vitals:   08/10/23 1355 08/10/23 1400 08/10/23 1415 08/10/23 1430  BP:  125/64 (!) 144/90 131/81  Pulse: 100 98 (!) 110 96  Resp:    20  Temp:      TempSrc:      SpO2: 96% 96% 96% 95%  Weight:      Height:       Physical Exam Vitals and nursing note reviewed.  Constitutional:      General: She is in acute distress (Secondary to pain).     Appearance: She is obese.  HENT:     Head: Normocephalic and atraumatic.  Cardiovascular:     Rate and Rhythm: Regular rhythm. Tachycardia present.  Heart sounds: No murmur heard. Pulmonary:     Effort: Pulmonary effort is normal. Tachypnea present.     Breath sounds: Normal breath sounds.     Comments: Pulmonary examination limited by patient's groaning/crying Abdominal:     Palpations: Abdomen is soft.     Tenderness: There is abdominal tenderness (Grimacing to palpation throughout). There is no guarding.  Musculoskeletal:     Right lower leg: No edema.     Left lower leg: No edema.  Skin:    General: Skin is warm and dry.  Neurological:     Mental Status: She is alert.  Psychiatric:        Mood and Affect: Mood is anxious.        Behavior: Behavior is agitated.    Data Reviewed: CBC with WBC of 14.6, hemoglobin of 11.9, MCV 80, platelets of 473 BMP with sodium of 131, potassium 3.8, bicarb 16, anion gap 14, glucose 360, BUN 15, creat 0.55 with GFR above 60 Troponin 21 with flat trend to 24 BNP 280  Serum beta hCG  6  EKG personally reviewed.  Sinus rhythm with rate of 115.  No ST or T wave changes concerning for acute ischemia.  CT Angio Chest/Abd/Pel for Dissection W and/or Wo Contrast  Result Date: 08/10/2023 CLINICAL DATA:  Acute aortic syndrome. Diffuse anterior chest and mid abdominal pain. Recent myocardial infarction with cardiac stent placement. EXAM: CT ANGIOGRAPHY CHEST, ABDOMEN AND PELVIS TECHNIQUE: Non-contrast CT of the chest was initially obtained. Multidetector CT imaging through the chest, abdomen and pelvis was performed using the standard protocol during bolus administration of intravenous contrast. Multiplanar reconstructed images and MIPs were obtained and reviewed to evaluate the vascular anatomy. RADIATION DOSE REDUCTION: This exam was performed according to the departmental dose-optimization program which includes automated exposure control, adjustment of the mA and/or kV according to patient size and/or use of iterative reconstruction technique. CONTRAST:  OMNIPAQUE IOHEXOL 350 MG/ML SOLN COMPARISON:  07/29/2023 FINDINGS: CTA CHEST FINDINGS Cardiovascular: Pre contrast imaging shows no hyperdense crescent in the wall of the thoracic aorta to suggest the presence of an acute intramural hematoma. The heart size is normal. No substantial pericardial effusion. Coronary artery calcification is evident. No thoracic aortic aneurysm. No dissection of the thoracic aorta. Mediastinum/Nodes: No mediastinal lymphadenopathy. There is no hilar lymphadenopathy. The esophagus has normal imaging features. There is no axillary lymphadenopathy. Lungs/Pleura: No suspicious pulmonary nodule or mass. No focal airspace consolidation. No pleural effusion. Musculoskeletal: No worrisome lytic or sclerotic osseous abnormality. Review of the MIP images confirms the above findings. CTA ABDOMEN AND PELVIS FINDINGS VASCULAR Aorta: Normal caliber aorta without aneurysm, dissection, vasculitis or significant stenosis.  Celiac: Patent without evidence of aneurysm, dissection, vasculitis or significant stenosis. SMA: Patent without evidence of aneurysm, dissection, vasculitis or significant stenosis. Renals: Both renal arteries are patent without evidence of aneurysm, dissection, vasculitis, fibromuscular dysplasia or significant stenosis. IMA: Patent without evidence of aneurysm, dissection, vasculitis or significant stenosis. Inflow: Patent without evidence of aneurysm, dissection, vasculitis or significant stenosis. Veins: No obvious venous abnormality within the limitations of this arterial phase study. Review of the MIP images confirms the above findings. NON-VASCULAR Hepatobiliary: No suspicious focal abnormality within the liver parenchyma. 2.7 cm water density structure adjacent to the heart and anterior hepatic dome shows no perceptible rim. 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 Subtle layering tiny gallstones evident (143/4). No gallbladder wall thickening, or pericholecystic fluid. No intrahepatic or extrahepatic biliary dilation. Pancreas: No focal mass lesion.  No dilatation of the main duct. No intraparenchymal cyst. No peripancreatic edema. Spleen: No splenomegaly. No suspicious focal mass lesion. Adrenals/Urinary Tract: No adrenal nodule or mass. Probable scarring upper pole right kidney. Left kidney unremarkable. No evidence for hydroureter. The urinary bladder appears normal for the degree of distention. Stomach/Bowel: Stomach is unremarkable. No gastric wall thickening. No evidence of outlet obstruction. Duodenum is normally positioned as is the ligament of Treitz. No small bowel wall thickening. No small bowel dilatation. The terminal ileum is normal. The appendix is normal. No gross colonic mass. No colonic wall thickening. Lymphatic: There is no gastrohepatic or hepatoduodenal ligament lymphadenopathy. No retroperitoneal or mesenteric lymphadenopathy. No pelvic sidewall lymphadenopathy. Reproductive:  There is no adnexal mass. Right ovary accentuated by immediately adjacent small bowel loops. Other: No intraperitoneal free fluid. Musculoskeletal: No worrisome lytic or sclerotic osseous abnormality. Review of the MIP images confirms the above findings. IMPRESSION: 1. No acute findings in the chest, abdomen, or pelvis. Specifically, no evidence for thoracoabdominal aortic aneurysm or dissection. 2. 2.7 cm water density structure adjacent to the heart and anterior hepatic dome shows no perceptible rim. 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 Stable. 3. Cholelithiasis. 4. Probable scarring upper pole right kidney. Electronically Signed   By: Kennith Center M.D.   On: 08/10/2023 13:47   DG Chest Portable 1 View  Result Date: 08/10/2023 CLINICAL DATA:  Chest pain. EXAM: PORTABLE CHEST 1 VIEW COMPARISON:  Feb 19, 2023. FINDINGS: The heart size and mediastinal contours are within normal limits. Both lungs are clear. The visualized skeletal structures are unremarkable. IMPRESSION: No active disease. Electronically Signed   By: Lupita Raider M.D.   On: 08/10/2023 11:25    Results are pending, will review when available.  Assessment and Plan:  * Gastroparesis Patient is endorsing nausea, vomiting and abdominal pain, stating this feels similar to her previous gastroparesis flares.  Per chart review, patient has had gastroparesis for several years now with normal EGD in 2020.  -Telemetry for QTc monitoring - 500 cc bolus - Scheduled Reglan 5 mg QID  Chest pain History of multivessel CAD s/p multiple PCI's, recent inferolateral STEMI with LCx/OM branches (07/20/2023) complicated by subacute stent thrombosis (07/23/2023). Troponin is minimally elevated and flat. EKG is reassuring, however given high risk history, will ask Cardiology to evaluate.   - Cardiology consulted; appreciate their recommendations - Recheck troponin this evening - EKG as needed for chest pain - Hold off on heparin  infusion - Continue home aspirin and prasugrel  Agitation Patient is significantly agitated in the setting of acute pain, however agitation seems out of proportion to pain.  Similar presentations in the past.  There may be a psychiatric component versus drug-induced mood disorder component given history of cocaine and amphetamine use.  - Ativan as needed for agitation - UDS pending - May need psychiatry consultation  Uncontrolled type 2 diabetes mellitus with hyperglycemia (HCC) History of uncontrolled type 2 diabetes with last A1c of 11% 2 weeks prior.  Glucose elevated, however this is likely her normal given elevated A1c.  - SSI, resistant - Hold home antiglycemic agents  Chronic HFrEF (heart failure with reduced ejection fraction) (HCC) History of HFrEF with recovery of EF on most recent echocardiogram approximately 2 weeks ago with LVEF of 55-60%.  Normal diastolic parameters at that time.  She appears euvolemic at this time despite BNP being elevated.  - Daily weights - Continue home regimen  Leukocytosis Potentially reactive in the setting of vomiting earlier.  Will continue to trend.  - Repeat CBC in the a.m.  Polysubstance abuse (HCC) Patient endorses cannabinoid use yesterday evening but no recent methamphetamine or cocaine use.  - UDS pending  Essential hypertension - Continue home regimen  Advance Care Planning:   Code Status: Full Code  Consults: Cardiology  Family Communication: Patient's husband at bedside, however asleep.  Will update when he awakes  Severity of Illness: The appropriate patient status for this patient is OBSERVATION. Observation status is judged to be reasonable and necessary in order to provide the required intensity of service to ensure the patient's safety. The patient's presenting symptoms, physical exam findings, and initial radiographic and laboratory data in the context of their medical condition is felt to place them at decreased risk  for further clinical deterioration. Furthermore, it is anticipated that the patient will be medically stable for discharge from the hospital within 2 midnights of admission.   Author: Verdene Lennert, MD 08/10/2023 3:48 PM  For on call review [email or web address].

## 2023-08-10 NOTE — ED Notes (Addendum)
Back to room from CT by CT tech. Intermittent moaning continues. Up to b/r per CT tech.

## 2023-08-10 NOTE — ED Notes (Signed)
Attempted report, called.

## 2023-08-10 NOTE — Assessment & Plan Note (Signed)
History of HFrEF with recovery of EF on most recent echocardiogram approximately 2 weeks ago with LVEF of 55-60%.  Normal diastolic parameters at that time.  She appears euvolemic at this time despite BNP being elevated.  - Daily weights - Continue home regimen

## 2023-08-10 NOTE — Progress Notes (Signed)
  Progress Note   Contacted by patient's RN Eboni stating, "patient is complaining of 10/10 abd pain. PT crying in bed on all 4 extremities crawling around hollering in bed. Patient only has tylenol ordered." Concern expressed regarding patient being aggressive.   Recommended giving PRN ativan for agitation, but patient refused.   Discussed with patient's husband Mr. Kious via telephone. I explained that primary suspicion is gastroparesis given otherwise normal work up. Discussed that opioids act as as anti-motility agents and can exacerbate gastroparesis symptoms. He expressed concern over how distressed patient is and that she is not receiving pain treatment. Discussed the importance of working on agitation, as it will ultimately make it difficult to treat patient if she cannot talk with/to medical providers.   # Severe Abdominal Pain suspected to be 2/2 Gastroparesis As stated in H&P, pain is out of proportion to examination. CTA chest/abdomen/pelvis have ruled out dissection and mesenteric ischemia. Patient is receiving IV reglan for prokinetic effects. It would be best to avoid anti-motility agents.   - For pain with agitation, trial of Ativan first. If ineffective, can trial low dose Dilaudid.  - Strongly emphasized to husband that ultimately patient needs to get her diabetes under control  Author: Verdene Lennert, MD 08/10/2023 8:34 PM  For on call review [email or web address].

## 2023-08-10 NOTE — Assessment & Plan Note (Signed)
Patient is significantly agitated in the setting of acute pain, however agitation seems out of proportion to pain.  Similar presentations in the past.  There may be a psychiatric component versus drug-induced mood disorder component given history of cocaine and amphetamine use.  - Ativan as needed for agitation - UDS pending - May need psychiatry consultation

## 2023-08-10 NOTE — ED Triage Notes (Signed)
Pt in via EMS from home with c/o cp. EMS reports pt with chest and abd pain. EMS was not able to get BP well due to patient movement. Pt takes diltiazem. Pt was given of fentanyl. #20g to right AC. CBG 411, pt was given 4mg  of zofran. Pt has 1.5 in of nitropaste to right upper chest. Started at 7 am gradual.

## 2023-08-10 NOTE — ED Notes (Signed)
Floor 2C (212) notified of pending arrival.

## 2023-08-10 NOTE — Consult Note (Addendum)
Cardiology Consultation   Patient ID: PATRYCIA MOUNTZ MRN: [id number]; DOB: [birth year]  Admit date: 08/10/2023 Date of Consult: 08/10/2023  PCP:  Dan Humphreys, MD   [facility] Providers Cardiologist: [facility] cardiology Click here to update MD or APP on Care Team, Refresh:1}     Patient Profile:   Karen Ruiz is a 42 y.o. female with a hx of CAD, who is being seen 08/10/2023 for the evaluation of chest pain at the request of Dr. Huel Cote.  History of Present Illness:   Karen Ruiz is a 42 year old female with history of CAD/PCI x3 (to RCA, LAD, Lcx-OM ), diabetes, hypertension, current smoker x 20+ years, gastroparesis, chronic abdominal pain presenting with abdominal and chest pain.  States having abdominal discomfort beginning earlier today associated with nausea and later vomiting.  Symptoms progressed to chest discomfort prompting her to come to the ED.  Recently had stent placed to distal left circumflex/OM branch last month 07/2023.  Still smokes.  Endorsed taking medications although she has a history of noncompliance.  Did not start Repatha due to insurance issues.  In the ED on admission, EKG showed sinus rhythm, troponins were 21, 24.  Patient in significant abdominal discomfort writhing in bed due to pain during my exam.    Past Medical History:  Diagnosis Date   Acute ST elevation myocardial infarction (STEMI) involving other coronary artery of inferior wall (HCC) 07/21/2023   Barrett esophagus    Diabetes mellitus without complication (HCC)    Gastroparesis    Migraines    MRSA (methicillin resistant staph aureus) culture positive    2009   Obesity    ST elevation myocardial infarction (STEMI) (HCC) 05/02/2021   Type 2 diabetes mellitus (HCC) 09/06/2016   Last Assessment & Plan:    Ordered new glucometer and test strips  Last Assessment & Plan:    Ordered new glucometer and test strips      Past Surgical History:  Procedure  Laterality Date   CARDIAC CATHETERIZATION     CESAREAN SECTION     CORONARY STENT INTERVENTION N/A 05/02/2021   Procedure: CORONARY STENT INTERVENTION;  Surgeon: Yvonne Kendall, MD;  Location: [facility] INVASIVE CV LAB;  Service: Cardiovascular;  Laterality: N/A;   CORONARY ULTRASOUND/IVUS N/A 05/02/2021   Procedure: Intravascular Ultrasound/IVUS;  Surgeon: Yvonne Kendall, MD;  Location: [facility] INVASIVE CV LAB;  Service: Cardiovascular;  Laterality: N/A;   CORONARY ULTRASOUND/IVUS N/A 07/23/2023   Procedure: Coronary Ultrasound/IVUS;  Surgeon: Iran Ouch, MD;  Location: [facility] INVASIVE CV LAB;  Service: Cardiovascular;  Laterality: N/A;   CORONARY/GRAFT ACUTE MI REVASCULARIZATION N/A 07/20/2023   Procedure: Coronary/Graft Acute MI Revascularization;  Surgeon: Iran Ouch, MD;  Location: [facility] INVASIVE CV LAB;  Service: Cardiovascular;  Laterality: N/A;   CORONARY/GRAFT ACUTE MI REVASCULARIZATION N/A 07/23/2023   Procedure: Coronary/Graft Acute MI Revascularization;  Surgeon: Iran Ouch, MD;  Location: [facility] INVASIVE CV LAB;  Service: Cardiovascular;  Laterality: N/A;   ESOPHAGOGASTRODUODENOSCOPY N/A 07/28/2019   Procedure: ESOPHAGOGASTRODUODENOSCOPY (EGD);  Surgeon: Toney Reil, MD;  Location: [facility];  Service: Gastroenterology;  Laterality: N/A;   LEFT HEART CATH AND CORONARY ANGIOGRAPHY N/A 05/02/2021   Procedure: LEFT HEART CATH AND CORONARY ANGIOGRAPHY;  Surgeon: Yvonne Kendall, MD;  Location: [facility] INV[facility] CV [facility];  Service: Cardiovascular;  Laterality: N/A;   LEFT HEART CATH AND CORONARY ANGIOGRAPHY N/A 07/20/2023   Procedure: LEFT HEART CATH AND CORONARY ANGIOGRAPHY;  Surgeon: Iran Ouch, MD;  Location: [facility] INVASIVE CV [facility];  Service: Cardiovascular;  Laterality: N/A;   LEFT HEART CATH AND CORONARY ANGIOGRAPHY N/A 07/23/2023   Procedure: LEFT HEART CATH AND CORONARY ANGIOGRAPHY;  Surgeon: Iran Ouch, MD;  Location: [facility] INVASIVE CV LAB;  Service:  Cardiovascular;  Laterality: N/A;     Home Medications:  Prior to Admission medications   Medication Sig Start Date End Date Taking? Authorizing Provider  aspirin EC 81 MG EC tablet Take 1 tablet (81 mg total) by mouth daily. Swallow whole. 05/04/21   Delfino Lovett, MD  atorvastatin (LIPITOR) 80 MG tablet Take 1 tablet (80 mg total) by mouth daily. 07/22/23 10/20/23  Arnetha Courser, MD  diltiazem (CARDIZEM CD) 120 MG 24 hr capsule Take 1 capsule (120 mg total) by mouth daily. 08/01/23 09/30/23  Tresa Moore, MD  escitalopram (LEXAPRO) 10 MG tablet Take 1 tablet (10 mg total) by mouth daily. 07/22/23 10/20/23  Arnetha Courser, MD  Evolocumab (REPATHA SURECLICK) 140 MG/ML SOAJ Inject 140 mg into the skin every 14 (fourteen) days. 07/31/23   Antonieta Iba, MD  insulin aspart (NOVOLOG) 100 UNIT/ML injection Inject 0-9 Units into the skin 3 (three) times daily with meals. 07/16/19   Katha Hamming, MD  pantoprazole (PROTONIX) 40 MG tablet Take 1 tablet (40 mg total) by mouth daily. PLEASE SCHEDULE OFFICE VISIT FOR FURTHER REFILLS. THANK YOU! 07/22/23 10/20/23  Arnetha Courser, MD  prasugrel (EFFIENT) 10 MG TABS tablet Take 1 tablet (10 mg total) by mouth daily. 08/01/23 09/30/23  Tresa Moore, MD  promethazine (PHENERGAN) 25 MG suppository Place 1 suppository (25 mg total) rectally every 8 (eight) hours as needed for refractory nausea / vomiting. 06/29/21   Shaune Pollack, MD    Inpatient Medications: Scheduled Meds:  enoxaparin (LOVENOX) injection  0.5 mg/kg Subcutaneous Q24H   insulin aspart  0-20 Units Subcutaneous TID WC   metoCLOPramide (REGLAN) injection  5 mg Intravenous Q6H   sodium chloride flush  3 mL Intravenous Q12H   Continuous Infusions:  PRN Meds: acetaminophen **OR** acetaminophen, nitroGLYCERIN  Allergies:    Allergies  Allergen Reactions   Quinolones Other (See Comments)    Social History:   Social History   Socioeconomic History   Marital status:  Single    Spouse name: Not on file   Number of children: Not on file   Years of education: Not on file   Highest education level: Not on file  Occupational History   Not on file  Tobacco Use   Smoking status: Some Days    Current packs/day: 0.50    Types: Cigarettes   Smokeless tobacco: Never  Vaping Use   Vaping status: Never Used  Substance and Sexual Activity   Alcohol use: Yes   Drug use: Yes    Types: Marijuana   Sexual activity: Not on file  Other Topics Concern   Not on file  Social History Narrative   Not on file   Social Determinants of Health   Financial Resource Strain: Low Risk  (05/09/2023)   Received from [facility]   Overall Financial Resource Strain (CARDIA)    Difficulty of Paying Living Expenses: Not very hard  Food Insecurity: No Food Insecurity (07/30/2023)   Hunger Vital Sign    Worried About Running Out of Food in the Last Year: Never true    Ran Out of Food in the Last Year: Never true  Transportation Needs: No Transportation Needs (07/30/2023)   PRAPARE - Administrator, Civil Service (Medical): No  Lack of Transportation (Non-Medical): No  Physical Activity: Not on file  Stress: Not on file  Social Connections: Not on file  Intimate Partner Violence: Not At Risk (07/30/2023)   Humiliation, Afraid, Rape, and Kick questionnaire    Fear of Current or Ex-Partner: No    Emotionally Abused: No    Physically Abused: No    Sexually Abused: No    Family History:    Family History  Problem Relation Age of Onset   Diabetes Mother    Cancer Mother    Heart failure Mother    Hypertension Mother    Heart attack Father    Diabetes Father    Hypertension Father      ROS:  Please see the history of present illness.   All other ROS reviewed and negative.     Physical Exam/Data:   Vitals:   08/10/23 1355 08/10/23 1400 08/10/23 1415 08/10/23 1430  BP:  125/64 (!) 144/90 131/81  Pulse: 100 98 (!) 110 96  Resp:    20  Temp:       TempSrc:      SpO2: 96% 96% 96% 95%  Weight:      Height:       No intake or output data in the 24 hours ending 08/10/23 1607    08/10/2023   12:02 PM 07/31/2023    4:09 AM 07/30/2023    1:42 AM  Last 3 Weights  Weight (lbs) 244 lb 14.9 oz 236 lb 15.9 oz 245 lb 9.5 oz  Weight (kg) 111.1 kg 107.5 kg 111.4 kg     Body mass index is 33.22 kg/m.  General: Severe abdominal distress HEENT: normal Neck: no JVD Vascular: No carotid bruits; Distal pulses 2+ bilaterally Cardiac:  normal S1, S2; RRR; no murmur  Lungs:  clear to auscultation bilaterally, no wheezing, rhonchi or rales  Abd: soft, nontender, no hepatomegaly  Ext: no edema Musculoskeletal:  No deformities, BUE and BLE strength normal and equal Skin: warm and dry  Neuro:  CNs 2-12 intact, no focal abnormalities noted Psych:  Normal affect   EKG:  The EKG was personally reviewed and demonstrates: Sinus rhythm Telemetry:  Telemetry was personally reviewed and demonstrates: Sinus rhythm  Relevant CV Studies:   LHC 07/23/2023 1.  Subacute stent thrombosis in the distal left circumflex with patent stent in the OM branch.  IVUS showed evidence of stent mall apposition proximally.  No evidence of edge dissection.  Patent stents in the LAD and RCA. 2.  Mildly reduced LV systolic function with mildly elevated left ventricular end-diastolic pressure. 3.  Successful balloon angioplasty to the proximal segment of the left circumflex stent using a 3.0 x 12 mm noncompliant balloon to 16 atm.  Echo 07/21/2023  1. Left ventricular ejection fraction, by estimation, is 55 to 60%. The  left ventricle has normal function. The left ventricle has no regional  wall motion abnormalities. Left ventricular diastolic parameters were  normal. The average left ventricular  global longitudinal strain is -16.9 %. The global longitudinal strain is  normal.   2. Right ventricular systolic function is normal. The right ventricular  size is normal.    3. The mitral valve is normal in structure. No evidence of mitral valve  regurgitation.   4. The aortic valve was not well visualized. Aortic valve regurgitation  is not visualized.   5. The inferior vena cava is normal in size with greater than 50%  respiratory variability, suggesting right atrial pressure of 3  mmHg.   Laboratory Data:  High Sensitivity Troponin:   Recent Labs  Lab 07/29/23 1833 07/30/23 0009 07/30/23 0209 08/10/23 0956 08/10/23 1140  TROPONINIHS 789* 622* 655* 21* 24*     Chemistry Recent Labs  Lab 08/10/23 0956  NA 131*  K 3.8  CL 101  CO2 16*  GLUCOSE 360*  BUN 15  CREATININE 0.55  CALCIUM 8.6*  GFRNONAA >60  ANIONGAP 14    No results for input(s): "PROT", "ALBUMIN", "AST", "ALT", "ALKPHOS", "BILITOT" in the last 168 hours. Lipids No results for input(s): "CHOL", "TRIG", "HDL", "LABVLDL", "LDLCALC", "CHOLHDL" in the last 168 hours.  Hematology Recent Labs  Lab 08/10/23 0956  WBC 14.6*  RBC 5.22*  HGB 11.9*  HCT 41.8  MCV 80.1  MCH 22.8*  MCHC 28.5*  RDW 17.5*  PLT 473*   Thyroid No results for input(s): "TSH", "FREET4" in the last 168 hours.  BNP Recent Labs  Lab 08/10/23 1140  BNP 280.9*    DDimer No results for input(s): "DDIMER" in the last 168 hours.   Radiology/Studies:  CT Angio Chest/Abd/Pel for Dissection W and/or Wo Contrast  Result Date: 08/10/2023 CLINICAL DATA:  Acute aortic syndrome. Diffuse anterior chest and mid abdominal pain. Recent myocardial infarction with cardiac stent placement. EXAM: CT ANGIOGRAPHY CHEST, ABDOMEN AND PELVIS TECHNIQUE: Non-contrast CT of the chest was initially obtained. Multidetector CT imaging through the chest, abdomen and pelvis was performed using the standard protocol during bolus administration of intravenous contrast. Multiplanar reconstructed images and MIPs were obtained and reviewed to evaluate the vascular anatomy. RADIATION DOSE REDUCTION: This exam was performed according to the  departmental dose-optimization program which includes automated exposure control, adjustment of the mA and/or kV according to patient size and/or use of iterative reconstruction technique. CONTRAST:  OMNIPAQUE IOHEXOL 350 MG/ML SOLN COMPARISON:  07/29/2023 FINDINGS: CTA CHEST FINDINGS Cardiovascular: Pre contrast imaging shows no hyperdense crescent in the wall of the thoracic aorta to suggest the presence of an acute intramural hematoma. The heart size is normal. No substantial pericardial effusion. Coronary artery calcification is evident. No thoracic aortic aneurysm. No dissection of the thoracic aorta. Mediastinum/Nodes: No mediastinal lymphadenopathy. There is no hilar lymphadenopathy. The esophagus has normal imaging features. There is no axillary lymphadenopathy. Lungs/Pleura: No suspicious pulmonary nodule or mass. No focal airspace consolidation. No pleural effusion. Musculoskeletal: No worrisome lytic or sclerotic osseous abnormality. Review of the MIP images confirms the above findings. CTA ABDOMEN AND PELVIS FINDINGS VASCULAR Aorta: Normal caliber aorta without aneurysm, dissection, vasculitis or significant stenosis. Celiac: Patent without evidence of aneurysm, dissection, vasculitis or significant stenosis. SMA: Patent without evidence of aneurysm, dissection, vasculitis or significant stenosis. Renals: Both renal arteries are patent without evidence of aneurysm, dissection, vasculitis, fibromuscular dysplasia or significant stenosis. IMA: Patent without evidence of aneurysm, dissection, vasculitis or significant stenosis. Inflow: Patent without evidence of aneurysm, dissection, vasculitis or significant stenosis. Veins: No obvious venous abnormality within the limitations of this arterial phase study. Review of the MIP images confirms the above findings. NON-VASCULAR Hepatobiliary: No suspicious focal abnormality within the liver parenchyma. 2.7 cm water density structure adjacent to the heart  and anterior hepatic dome shows no perceptible rim. 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 Subtle layering tiny gallstones evident (143/4). No gallbladder wall thickening, or pericholecystic fluid. No intrahepatic or extrahepatic biliary dilation. Pancreas: No focal mass lesion. No dilatation of the main duct. No intraparenchymal cyst. No peripancreatic edema. Spleen: No splenomegaly. No suspicious focal mass lesion. Adrenals/Urinary Tract: No  adrenal nodule or mass. Probable scarring upper pole right kidney. Left kidney unremarkable. No evidence for hydroureter. The urinary bladder appears normal for the degree of distention. Stomach/Bowel: Stomach is unremarkable. No gastric wall thickening. No evidence of outlet obstruction. Duodenum is normally positioned as is the ligament of Treitz. No small bowel wall thickening. No small bowel dilatation. The terminal ileum is normal. The appendix is normal. No gross colonic mass. No colonic wall thickening. Lymphatic: There is no gastrohepatic or hepatoduodenal ligament lymphadenopathy. No retroperitoneal or mesenteric lymphadenopathy. No pelvic sidewall lymphadenopathy. Reproductive: There is no adnexal mass. Right ovary accentuated by immediately adjacent small bowel loops. Other: No intraperitoneal free fluid. Musculoskeletal: No worrisome lytic or sclerotic osseous abnormality. Review of the MIP images confirms the above findings. IMPRESSION: 1. No acute findings in the chest, abdomen, or pelvis. Specifically, no evidence for thoracoabdominal aortic aneurysm or dissection. 2. 2.7 cm water density structure adjacent to the heart and anterior hepatic dome shows no perceptible rim. 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 Stable. 3. Cholelithiasis. 4. Probable scarring upper pole right kidney. Electronically Signed   By: Kennith Center M.D.   On: 08/10/2023 13:47   DG Chest Portable 1 View  Result Date: 08/10/2023 CLINICAL DATA:  Chest pain. EXAM:  PORTABLE CHEST 1 VIEW COMPARISON:  Feb 19, 2023. FINDINGS: The heart size and mediastinal contours are within normal limits. Both lungs are clear. The visualized skeletal structures are unremarkable. IMPRESSION: No active disease. Electronically Signed   By: Lupita Raider M.D.   On: 08/10/2023 11:25     Assessment and Plan:   Chest pain.  History of CAD/PCI x 3. -Troponins minimally elevated at 21, 24. -Her symptoms are largely abdominal. -Left heart cath last month with patent stents. -No plans for ischemic workup. -Restart home medications aspirin, Effient, Lipitor 80, Cardizem when able to tolerate p.o. -Repatha on discharge. -Echo last month with normal EF.  2.  Polysubstance abuse, still smokes -UDS pending -Smoking cessation strongly advised  3.  Abdominal discomfort, history of gastroparesis -Workup and management as per medicine team.   Signed, Debbe Odea, MD  08/10/2023 4:07 PM

## 2023-08-10 NOTE — ED Notes (Signed)
EDP out of room, orders received. Pt restless rolling over and over in stretcher, musically moaning, agitated, currently prone for comfort, covering self with blanket, no respiratory distress.

## 2023-08-10 NOTE — Assessment & Plan Note (Addendum)
History of uncontrolled type 2 diabetes with last A1c of 11% 2 weeks prior.  Glucose elevated, however this is likely her normal given elevated A1c.  - SSI, resistant - Hold home antiglycemic agents

## 2023-08-10 NOTE — Assessment & Plan Note (Signed)
-   Continue home regimen 

## 2023-08-10 NOTE — Assessment & Plan Note (Signed)
Potentially reactive in the setting of vomiting earlier.  Will continue to trend.  - Repeat CBC in the a.m.

## 2023-08-11 ENCOUNTER — Encounter: Payer: Self-pay | Admitting: Internal Medicine

## 2023-08-11 DIAGNOSIS — K3184 Gastroparesis: Secondary | ICD-10-CM

## 2023-08-11 DIAGNOSIS — R109 Unspecified abdominal pain: Secondary | ICD-10-CM | POA: Diagnosis present

## 2023-08-11 DIAGNOSIS — F1910 Other psychoactive substance abuse, uncomplicated: Secondary | ICD-10-CM | POA: Diagnosis not present

## 2023-08-11 DIAGNOSIS — I2510 Atherosclerotic heart disease of native coronary artery without angina pectoris: Secondary | ICD-10-CM | POA: Diagnosis not present

## 2023-08-11 LAB — GLUCOSE, CAPILLARY
Glucose-Capillary: 161 mg/dL — ABNORMAL HIGH (ref 70–99)
Glucose-Capillary: 152 mg/dL — ABNORMAL HIGH (ref 70–99)

## 2023-08-11 LAB — CBC WITH DIFFERENTIAL/PLATELET
Abs Immature Granulocytes: 0.03 10*3/uL (ref 0.00–0.07)
Basophils Absolute: 0 10*3/uL (ref 0.0–0.1)
Basophils Relative: 0 %
Eosinophils Absolute: 0.1 10*3/uL (ref 0.0–0.5)
Eosinophils Relative: 1 %
HCT: 31.3 % — ABNORMAL LOW (ref 36.0–46.0)
Hemoglobin: 9.8 g/dL — ABNORMAL LOW (ref 12.0–15.0)
Immature Granulocytes: 0 %
Lymphocytes Relative: 25 %
Lymphs Abs: 2.4 10*3/uL (ref 0.7–4.0)
MCH: 22.9 pg — ABNORMAL LOW (ref 26.0–34.0)
MCHC: 31.3 g/dL (ref 30.0–36.0)
MCV: 73.1 fL — ABNORMAL LOW (ref 80.0–100.0)
Monocytes Absolute: 0.8 10*3/uL (ref 0.1–1.0)
Monocytes Relative: 8 %
Neutro Abs: 6.3 10*3/uL (ref 1.7–7.7)
Neutrophils Relative %: 66 %
Platelets: 362 10*3/uL (ref 150–400)
RBC: 4.28 MIL/uL (ref 3.87–5.11)
RDW: 17.3 % — ABNORMAL HIGH (ref 11.5–15.5)
WBC: 9.5 10*3/uL (ref 4.0–10.5)
nRBC: 0 % (ref 0.0–0.2)

## 2023-08-11 LAB — URINE DRUG SCREEN, QUALITATIVE (ARMC ONLY)
Amphetamines, Ur Screen: NOT DETECTED
Barbiturates, Ur Screen: NOT DETECTED
Benzodiazepine, Ur Scrn: POSITIVE — AB
Cannabinoid 50 Ng, Ur ~~LOC~~: POSITIVE — AB
Cocaine Metabolite,Ur ~~LOC~~: NOT DETECTED
MDMA (Ecstasy)Ur Screen: NOT DETECTED
Methadone Scn, Ur: NOT DETECTED
Opiate, Ur Screen: POSITIVE — AB
Phencyclidine (PCP) Ur S: NOT DETECTED
Tricyclic, Ur Screen: NOT DETECTED

## 2023-08-11 LAB — COMPREHENSIVE METABOLIC PANEL
ALT: 17 U/L (ref 0–44)
AST: 18 U/L (ref 15–41)
Albumin: 3.1 g/dL — ABNORMAL LOW (ref 3.5–5.0)
Alkaline Phosphatase: 67 U/L (ref 38–126)
Anion gap: 10 (ref 5–15)
BUN: 15 mg/dL (ref 6–20)
CO2: 22 mmol/L (ref 22–32)
Calcium: 8.5 mg/dL — ABNORMAL LOW (ref 8.9–10.3)
Chloride: 104 mmol/L (ref 98–111)
Creatinine, Ser: 0.48 mg/dL (ref 0.44–1.00)
GFR, Estimated: >60 mL/min (ref >60)
Glucose, Bld: 155 mg/dL — ABNORMAL HIGH (ref 70–99)
Potassium: 3.4 mmol/L — ABNORMAL LOW (ref 3.5–5.1)
Sodium: 136 mmol/L (ref 135–145)
Total Bilirubin: 0.4 mg/dL (ref <1.2)
Total Protein: 6 g/dL — ABNORMAL LOW (ref 6.5–8.1)

## 2023-08-11 MED ORDER — BUTALBITAL-APAP-CAFFEINE 50-325-40 MG PO TABS
2.0000 | ORAL_TABLET | Freq: Once | ORAL | Status: AC
  Administered 2023-08-11: 2 via ORAL
  Filled 2023-08-11: qty 2

## 2023-08-11 MED ORDER — POTASSIUM CHLORIDE CRYS ER 20 MEQ PO TBCR
40.0000 meq | EXTENDED_RELEASE_TABLET | Freq: Once | ORAL | Status: AC
  Administered 2023-08-11: 40 meq via ORAL
  Filled 2023-08-11: qty 2

## 2023-08-11 MED ORDER — HYDROCODONE-ACETAMINOPHEN 5-325 MG PO TABS: 1.0000 | ORAL_TABLET | ORAL | Status: DC | PRN

## 2023-08-11 NOTE — Progress Notes (Addendum)
Complaining of 10/10 abd pain. Pt crying. On all 4 extremities crawling around hollering in bed. Only tylenol ordered. MD notified.   Patient became agitated because stronger pain medications were not ordered to treat her 10/10 pain.  Patient and husband demanding explanation from MD only. MD called husband via phone and explained patient condition and addressed concerns.  See new orders.

## 2023-08-11 NOTE — Progress Notes (Signed)
PROGRESS NOTE    Karen Ruiz  ZOX:[id number] DOB: 1981-09-26 DOA: 08/10/2023 PCP: Dan Humphreys, MD  [id number]/212A-AA  LOS: 0 days   Brief hospital course:   Assessment & Plan: Karen Ruiz is a 42 y.o. female with medical history significant of extensive CAD s/p recent inferior lateral STEMI s/p PCI to distal Lcx/OM  branches complicated by subacute stent thrombosis, uncontrolled type 2 diabetes, hypertension, hyperlipidemia, polysubstance abuse who presents to the ED due to chest pain.   History limited by patient's pain and difficulty cooperating.  Karen Ruiz states that yesterday evening, she smoked some marijuana because usually this helps with her pain symptoms.  Then today, she awoke with nausea, vomiting, diffuse abdominal pain.   * N/V and abdominal pain Hx of gastroparesis Patient is endorsing nausea, vomiting and abdominal pain, stating this feels similar to her previous gastroparesis flares.  Per chart review, patient has had gastroparesis for several years now with normal EGD in 2020.  However, pt also had marijuana use which can contribute to N/V. --No N/V, and pt said she could eat, however, pt said her abdominal pain would come unexpectedly which then would cause N/V.   Plan: - Scheduled Reglan 5 mg QID --cont clear liquid diet --consult to dietician --Norco PRN for pain (no IV dilaudid)  Chest pain, ruled out ACS History of multivessel CAD s/p multiple PCI's, recent inferolateral STEMI with LCx/OM branches (07/20/2023) complicated by subacute stent thrombosis (07/23/2023). Troponin is minimally elevated and flat. EKG is reassuring, however given high risk history, will ask Cardiology to evaluate.  - Cardiology consulted; no plan for ischemic workup --cont ASA, Effient and Lipitor --pt asks for Repatha due to her consistent use of lipitor.  Prior authorization may be needed for Repatha. Follows up with [facility] cardiology.   Agitation Patient is  significantly agitated in the setting of acute pain, however agitation seems out of proportion to pain.  Similar presentations in the past.  There may be a psychiatric component versus drug-induced mood disorder component given history of cocaine and amphetamine use. --UDS pos for cannabinoid - Ativan as needed for agitation  Uncontrolled type 2 diabetes mellitus with hyperglycemia (HCC) History of uncontrolled type 2 diabetes with last A1c of 11% 2 weeks prior.  Has been >=10 for the past 4 years. - SSI, resistant - Hold home antiglycemic agents  Chronic HFrEF (heart failure with reduced ejection fraction) (HCC) History of HFrEF with recovery of EF on most recent echocardiogram approximately 2 weeks ago with LVEF of 55-60%.  Normal diastolic parameters at that time.  She appears euvolemic at this time despite BNP being elevated.  Leukocytosis Potentially reactive in the setting of vomiting earlier.  Normalized without abx.  Polysubstance abuse (HCC) Patient endorses cannabinoid use yesterday evening but no recent methamphetamine or cocaine use.  Essential hypertension - Continue cardizem   DVT prophylaxis: Lovenox SQ Code Status: Full code  Family Communication: husband updated at bedside today Level of care: Med-Surg Dispo:   The patient is from: home Anticipated d/c is to: home Anticipated d/c date is: tomorrow   Subjective and Interval History:  Pt reported headache.  No N/V, and pt said she could eat, however, pt said her abdominal pain would come unexpectedly which then would cause N/V.     Objective: Vitals:   08/10/23 1700 08/10/23 2054 08/11/23 0441 08/11/23 0744  BP: 119/67 (!) 101/59 119/66 108/71  Pulse: 97 100 97 92  Resp:  18 18 18   Temp: 98  F (36.7 C) 98.2 F (36.8 C) 98 F (36.7 C) 98.2 F (36.8 C)  TempSrc: Oral Oral Oral Oral  SpO2: 95% 92% 95% 95%  Weight:      Height:       No intake or output data in the 24 hours ending 08/11/23 1940 Filed  Weights   08/10/23 1202  Weight: 111.1 kg    Examination:   Constitutional: NAD, AAOx3 HEENT: conjunctivae and lids normal, EOMI CV: No cyanosis.   RESP: normal respiratory effort, on RA Neuro: II - XII grossly intact.   Psych: grouchy mood and affect.     Data Reviewed: I have personally reviewed labs and imaging studies  Time spent: 50 minutes  Darlin Priestly, MD [facility] If 7PM-7AM, please contact night-coverage 08/11/2023, 7:40 PM

## 2023-08-11 NOTE — Plan of Care (Signed)

## 2023-08-11 NOTE — Progress Notes (Signed)
   Patient Name: Karen Ruiz [id number] Date of Encounter: 08/11/2023 [facility] Cardiologist: None   Interval Summary  .    Patient seen on a.m. rounds.  Denies any chest pain or shortness of breath.  Continues to have abdominal discomfort that has improved since admission.  No overnight events have been recorded.  Vital Signs .    Vitals:   08/10/23 1700 08/10/23 2054 08/11/23 0441 08/11/23 0744  BP: 119/67 (!) 101/59 119/66 108/71  Pulse: 97 100 97 92  Resp:  18 18 18   Temp: 98 F (36.7 C) 98.2 F (36.8 C) 98 F (36.7 C) 98.2 F (36.8 C)  TempSrc: Oral Oral Oral Oral  SpO2: 95% 92% 95% 95%  Weight:      Height:       No intake or output data in the 24 hours ending 08/11/23 0909    08/10/2023   12:02 PM 07/31/2023    4:09 AM 07/30/2023    1:42 AM  Last 3 Weights  Weight (lbs) 244 lb 14.9 oz 236 lb 15.9 oz 245 lb 9.5 oz  Weight (kg) 111.1 kg 107.5 kg 111.4 kg      Telemetry/ECG    Currently not on telemetry but has been reordered- Personally Reviewed  Physical Exam .   GEN: No acute distress.   Neck: No JVD Cardiac: RRR, no murmurs, rubs, or gallops.  Respiratory: Clear to auscultation bilaterally. GI: Soft, nontender, non-distended  MS: No edema  Assessment & Plan .     Chest pain/CAD/PCI x 3 -Presented with abdominal and chest pain -Last heart catheterization completed 07/2023 revealed patent stents -No current plans for ischemic workup -High-sensitivity troponins trended 21 and 24 flat -Complaints are largely abdominal -Continued on aspirin, Effient, atorvastatin, and diltiazem -Repatha on discharge -Continue on telemetry monitoring -EKG as needed for pain or changes  Hypokalemia -Mild hypokalemia noted on labs -Serum potassium 3.4 -Supplementation given -Daily BMP -Recommend keeping potassium closer to 4 -Monitor/trend/replete electrolytes as needed  Polysubstance abuse -Continues to smoke with total cessation cessation  recommended -Prior history of amphetamine and marijuana use -UDS pending  Abdominal discomfort with history of gastroparesis -CT negative for dissection or mesenteric ischemia -Continued management and workup per IM team  Hypertension -Blood pressure 108/71 -Continued on diltiazem -Vital signs per unit protocol  Hyperlipidemia -Continue atorvastatin 80 mg daily Elevated LP(a) previously been recommended started on PCSK9 inhibitor- -LDL 165  Type 2 diabetes -Continued management per IM For questions or updates, please contact [facility] Please consult [email or web address] for contact info under        Signed, Trenda Corliss, NP

## 2023-08-11 NOTE — Progress Notes (Signed)
                                                  Against Medical Advice Patient at this time expresses desire to leave the Hospital immediately, patient has been warned that this is not Medically advisable at this time, and can result in Medical complications like Death and Disability, patient understands and accepts the risks involved and assumes full responsibilty of this decision.  This patient has also been advised that if they feel the need for further medical assistance to return to any available ER or dial [phone].  Informed by Nursing staff that this patient has left care and has signed the form  Against Medical Advice on 08/11/2023 at 1930 hrs.  Julias Mould Lamin Geradine Girt, MSN, APRN, AGACNP-BC [facility] [place] Pager: [phone]. Check Amion for Availability

## 2023-08-11 NOTE — Progress Notes (Signed)
Pt left unit/hospital AMA. MD aware

## 2023-08-11 NOTE — Progress Notes (Signed)
Patient requested to leave AMA PIV removed and tolerated well.
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
## 2023-08-12 NOTE — Discharge Summary (Signed)
Physician [facility] Discharge Summary   Karen Ruiz  female DOB: September 12, 1981  NWG:[id number]  PCP: Dan Humphreys, MD  Admit date: 08/10/2023 Discharge date: 08/11/2023  CODE STATUS: Full code   Hospital Course:  For full details, please see H&P, progress notes, consult notes and ancillary notes.  Briefly,  Karen Ruiz is a 42 y.o. female with medical history significant of extensive CAD s/p recent inferior lateral STEMI s/p PCI to distal Lcx/OM branches complicated by subacute stent thrombosis, uncontrolled type 2 diabetes, hypertension, hyperlipidemia, polysubstance abuse who presented to the ED due to pain.   Karen Ruiz states that the day PTA, she smoked some marijuana because usually this helps with her pain symptoms.  Then on the day of presentation, she awoke with nausea, vomiting, diffuse abdominal pain.  Pt was admitted for presumed gastroparesis.  Cardio consulted who ruled out ACS.  Pt left the hospital after day shift ended, so pt couldn't be discharged properly, and therefore it was an [facility] discharge.   * N/V and abdominal pain Hx of gastroparesis Patient reported nausea, vomiting and abdominal pain, stating this feels similar to her previous gastroparesis flares.  Per chart review, patient has had gastroparesis for several years now with normal EGD in 2020.  However, pt also had marijuana use which can contribute to N/V. --pt said her abdominal pain would come unexpectedly which then would cause N/V.   -Scheduled Reglan 5 mg QID.  Pt was on clear liquid diet prior to leaving AMA.  There was plan for consulting dietician for teaching of gastroparesis diet in the setting of DM2, however pt left AMA prior to being seen by dietician.   Chest pain, ruled out ACS History of multivessel CAD s/p multiple PCI's, recent inferolateral STEMI with LCx/OM branches (07/20/2023) complicated by subacute stent thrombosis (07/23/2023). Troponin is minimally elevated and  flat. EKG was reassuring, however given high risk history, Cardiology consulted; no plan for ischemic workup --cont ASA, Effient and Lipitor --pt asks for Repatha due to her inconsistent use of lipitor.  Prior authorization may be needed for Repatha. Follows up with [facility] cardiology.    Agitation Patient is significantly agitated in the setting of acute pain, however agitation seems out of proportion to pain.  Similar presentations in the past.  There may be a psychiatric component versus drug-induced mood disorder component given history of cocaine and amphetamine use. --UDS pos for cannabinoid -received Ativan as needed for agitation   Uncontrolled type 2 diabetes mellitus with hyperglycemia (HCC) History of uncontrolled type 2 diabetes with last A1c of 11% 2 weeks prior.  A1c has been >=10 for the past 4 years.  Hx of Chronic HFrEF (heart failure with reduced ejection fraction) (HCC) History of HFrEF with recovery of EF on most recent echocardiogram approximately 2 weeks ago with LVEF of 55-60%.  Normal diastolic parameters at that time.  She appears euvolemic at this time despite BNP being elevated.   Leukocytosis Potentially reactive in the setting of vomiting earlier.  Normalized without abx.   Polysubstance abuse (HCC) Patient endorses cannabinoid use but no recent methamphetamine or cocaine use.   Essential hypertension - Continue cardizem   Discharge Diagnoses:  Principal Problem:   Gastroparesis Active Problems:   Chest pain   Agitation   Chronic HFrEF (heart failure with reduced ejection fraction) (HCC)   Uncontrolled type 2 diabetes mellitus with hyperglycemia (HCC)   Leukocytosis   Essential hypertension   Polysubstance abuse (HCC)   Coronary artery disease  involving native coronary artery of native heart   Abdominal pain   30 Day Unplanned Readmission Risk Score    Flowsheet Row ED to Hosp-Admission (Discharged) from 08/10/2023 in [facility]  GENERAL SURGERY  30 Day Unplanned Readmission Risk Score (%) 28.94 Filed at 08/11/2023 2000       This score is the patient's risk of an unplanned readmission within 30 days of being discharged (0 -100%). The score is based on dignosis, age, lab data, medications, orders, and past utilization.   Low:  0-14.9   Medium: 15-21.9   High: 22-29.9   Extreme: 30 and above         Discharge Instructions:   Allergies  Allergen Reactions   Quinolones Other (See Comments)     The results of significant diagnostics from this hospitalization (including imaging, microbiology, ancillary and laboratory) are listed below for reference.   Consultations:   Procedures/Studies: CT Angio Chest/Abd/Pel for Dissection W and/or Wo Contrast  Result Date: 08/10/2023 CLINICAL DATA:  Acute aortic syndrome. Diffuse anterior chest and mid abdominal pain. Recent myocardial infarction with cardiac stent placement. EXAM: CT ANGIOGRAPHY CHEST, ABDOMEN AND PELVIS TECHNIQUE: Non-contrast CT of the chest was initially obtained. Multidetector CT imaging through the chest, abdomen and pelvis was performed using the standard protocol during bolus administration of intravenous contrast. Multiplanar reconstructed images and MIPs were obtained and reviewed to evaluate the vascular anatomy. RADIATION DOSE REDUCTION: This exam was performed according to the departmental dose-optimization program which includes automated exposure control, adjustment of the mA and/or kV according to patient size and/or use of iterative reconstruction technique. CONTRAST:  OMNIPAQUE IOHEXOL 350 MG/ML SOLN COMPARISON:  07/29/2023 FINDINGS: CTA CHEST FINDINGS Cardiovascular: Pre contrast imaging shows no hyperdense crescent in the wall of the thoracic aorta to suggest the presence of an acute intramural hematoma. The heart size is normal. No substantial pericardial effusion. Coronary artery calcification is evident. No thoracic aortic aneurysm. No  dissection of the thoracic aorta. Mediastinum/Nodes: No mediastinal lymphadenopathy. There is no hilar lymphadenopathy. The esophagus has normal imaging features. There is no axillary lymphadenopathy. Lungs/Pleura: No suspicious pulmonary nodule or mass. No focal airspace consolidation. No pleural effusion. Musculoskeletal: No worrisome lytic or sclerotic osseous abnormality. Review of the MIP images confirms the above findings. CTA ABDOMEN AND PELVIS FINDINGS VASCULAR Aorta: Normal caliber aorta without aneurysm, dissection, vasculitis or significant stenosis. Celiac: Patent without evidence of aneurysm, dissection, vasculitis or significant stenosis. SMA: Patent without evidence of aneurysm, dissection, vasculitis or significant stenosis. Renals: Both renal arteries are patent without evidence of aneurysm, dissection, vasculitis, fibromuscular dysplasia or significant stenosis. IMA: Patent without evidence of aneurysm, dissection, vasculitis or significant stenosis. Inflow: Patent without evidence of aneurysm, dissection, vasculitis or significant stenosis. Veins: No obvious venous abnormality within the limitations of this arterial phase study. Review of the MIP images confirms the above findings. NON-VASCULAR Hepatobiliary: No suspicious focal abnormality within the liver parenchyma. 2.7 cm water density structure adjacent to the heart and anterior hepatic dome shows no perceptible rim. 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 Subtle layering tiny gallstones evident (143/4). No gallbladder wall thickening, or pericholecystic fluid. No intrahepatic or extrahepatic biliary dilation. Pancreas: No focal mass lesion. No dilatation of the main duct. No intraparenchymal cyst. No peripancreatic edema. Spleen: No splenomegaly. No suspicious focal mass lesion. Adrenals/Urinary Tract: No adrenal nodule or mass. Probable scarring upper pole right kidney. Left kidney unremarkable. No evidence for hydroureter. The  urinary bladder appears normal for the degree of  distention. Stomach/Bowel: Stomach is unremarkable. No gastric wall thickening. No evidence of outlet obstruction. Duodenum is normally positioned as is the ligament of Treitz. No small bowel wall thickening. No small bowel dilatation. The terminal ileum is normal. The appendix is normal. No gross colonic mass. No colonic wall thickening. Lymphatic: There is no gastrohepatic or hepatoduodenal ligament lymphadenopathy. No retroperitoneal or mesenteric lymphadenopathy. No pelvic sidewall lymphadenopathy. Reproductive: There is no adnexal mass. Right ovary accentuated by immediately adjacent small bowel loops. Other: No intraperitoneal free fluid. Musculoskeletal: No worrisome lytic or sclerotic osseous abnormality. Review of the MIP images confirms the above findings. IMPRESSION: 1. No acute findings in the chest, abdomen, or pelvis. Specifically, no evidence for thoracoabdominal aortic aneurysm or dissection. 2. 2.7 cm water density structure adjacent to the heart and anterior hepatic dome shows no perceptible rim. 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 Stable. 3. Cholelithiasis. 4. Probable scarring upper pole right kidney. Electronically Signed   By: Kennith Center M.D.   On: 08/10/2023 13:47   DG Chest Portable 1 View  Result Date: 08/10/2023 CLINICAL DATA:  Chest pain. EXAM: PORTABLE CHEST 1 VIEW COMPARISON:  Feb 19, 2023. FINDINGS: The heart size and mediastinal contours are within normal limits. Both lungs are clear. The visualized skeletal structures are unremarkable. IMPRESSION: No active disease. Electronically Signed   By: Lupita Raider M.D.   On: 08/10/2023 11:25   US ABDOMEN LIMITED RUQ (LIVER/GB)  Result Date: 07/30/2023 CLINICAL DATA:  42 year old female with history of chest pain. EXAM: ULTRASOUND ABDOMEN LIMITED RIGHT UPPER QUADRANT COMPARISON:  Abdominal ultrasound 08/26/2011. FINDINGS: Gallbladder: Gallbladder is not distended.  There are some tiny echogenic foci lying dependently with posterior acoustic shadowing, compatible with gallstones, measuring up to 6 mm. Gallbladder wall thickness is normal at 1.8 mm. No pericholecystic fluid. No sonographic Murphy's sign. Common bile duct: Diameter: 2.4 mm Liver: No focal lesion identified. Diffusely increased parenchymal echogenicity. Portal vein is patent on color Doppler imaging with normal direction of blood flow towards the liver. Other: None. IMPRESSION: 1. No acute findings to account for the patient's symptoms. 2. Cholelithiasis without evidence of acute cholecystitis. 3. Diffusely increased hepatic echogenicity, indicative of a background of hepatic steatosis. Electronically Signed   By: Trudie Reed M.D.   On: 07/30/2023 05:20   CT Angio Chest/Abd/Pel for Dissection W and/or Wo Contrast  Result Date: 07/29/2023 CLINICAL DATA:  Severe chest pain EXAM: CT ANGIOGRAPHY CHEST, ABDOMEN AND PELVIS TECHNIQUE: Non-contrast CT of the chest was initially obtained. Multidetector CT imaging through the chest, abdomen and pelvis was performed using the standard protocol during bolus administration of intravenous contrast. Multiplanar reconstructed images and MIPs were obtained and reviewed to evaluate the vascular anatomy. RADIATION DOSE REDUCTION: This exam was performed according to the departmental dose-optimization program which includes automated exposure control, adjustment of the mA and/or kV according to patient size and/or use of iterative reconstruction technique. CONTRAST:  OMNIPAQUE IOHEXOL 350 MG/ML SOLN COMPARISON:  Chest CT 02/19/2023, CT abdomen pelvis 12/30/2021 FINDINGS: CTA CHEST FINDINGS Cardiovascular: Non contrasted images of the chest demonstrate no acute intramural hematoma. Negative for aortic aneurysm or dissection. Cardiac size is normal. Coronary calcifications and stent. No pericardial effusion Mediastinum/Nodes: Midline trachea. No thyroid mass. No  suspicious lymph nodes. Esophagus within normal limits. Increased fluid density structure in the right cardio phrenic fat, measuring 2.8 by 1.9 cm. Lungs/Pleura: Lungs are clear. No pleural effusion or pneumothorax. Musculoskeletal: No chest wall abnormality. No acute or significant osseous findings. Review  of the MIP images confirms the above findings. CTA ABDOMEN AND PELVIS FINDINGS VASCULAR Aorta: Normal caliber aorta without aneurysm, dissection, vasculitis or significant stenosis. Mild atherosclerosis Celiac: Patent without evidence of aneurysm, dissection, vasculitis or significant stenosis. SMA: Patent without evidence of aneurysm, dissection, vasculitis or significant stenosis. Renals: Both renal arteries are patent without evidence of aneurysm, dissection, vasculitis, fibromuscular dysplasia or significant stenosis. IMA: Patent without evidence of aneurysm, dissection, vasculitis or significant stenosis. Inflow: Patent without evidence of aneurysm, dissection, vasculitis or significant stenosis. Veins: Suboptimally evaluated. Review of the MIP images confirms the above findings. NON-VASCULAR Hepatobiliary: Small gallstones. No focal hepatic abnormality or biliary dilatation. Pancreas: Unremarkable. No pancreatic ductal dilatation or surrounding inflammatory changes. Spleen: Normal in size without focal abnormality. Adrenals/Urinary Tract: Adrenal glands are unremarkable. Kidneys are normal, without renal calculi, focal lesion, or hydronephrosis. Bladder is unremarkable. Stomach/Bowel: Stomach is within normal limits. Appendix appears normal. No evidence of bowel wall thickening, distention, or inflammatory changes. Lymphatic: No suspicious lymph nodes. Reproductive: Uterus and bilateral adnexa are unremarkable. Other: Negative for pelvic effusion or free air Musculoskeletal: No acute osseous abnormality Review of the MIP images confirms the above findings. IMPRESSION: 1. Negative for aortic aneurysm or  dissection. 2. No CT evidence for acute intrathoracic, abdominal, or pelvic abnormality. 3. Cholelithiasis. 4. Increased fluid density structure in the right cardio phrenic fat, measuring 2.8 x 1.9 cm, possible enlarging pericardial cyst. Attention on follow-up chest CT. Electronically Signed   By: Jasmine Pang M.D.   On: 07/29/2023 21:33   CARDIAC CATHETERIZATION  Result Date: 07/23/2023   Mid LAD lesion is 30% stenosed.   Prox RCA to Mid RCA lesion is 30% stenosed.   1st Diag lesion is 100% stenosed.   2nd Diag lesion is 100% stenosed.   RPDA lesion is 50% stenosed.   Dist Cx lesion is 50% stenosed.   Non-stenotic Ost LAD to Mid LAD lesion was previously treated.   Non-stenotic 3rd Mrg lesion was previously treated.   Balloon angioplasty was performed using a BALLN [place] TREK NEO RX 3.0X12.   Post intervention, there is a 0% residual stenosis.   There is mild left ventricular systolic dysfunction.   LV end diastolic pressure is mildly elevated.   The left ventricular ejection fraction is 45-50% by visual estimate. 1.  Subacute stent thrombosis in the distal left circumflex with patent stent in the OM branch.  IVUS showed evidence of stent mall apposition proximally.  No evidence of edge dissection.  Patent stents in the LAD and RCA. 2.  Mildly reduced LV systolic function with mildly elevated left ventricular end-diastolic pressure. 3.  Successful balloon angioplasty to the proximal segment of the left circumflex stent using a 3.0 x 12 mm noncompliant balloon to 16 atm. Recommendations: Continue dual antiplatelet therapy indefinitely. Aggressive treatment of risk factors and consider a PCSK9 inhibitor given elevated LDL and LP(a).   ECHOCARDIOGRAM COMPLETE  Result Date: 07/21/2023    ECHOCARDIOGRAM REPORT   Patient Name:   SHEETAL FROELICH Date of Exam: 07/21/2023 Medical Rec #:  [id number]            Height:       66.0 in Accession #:    [id number]           Weight:       240.0 lb Date of Birth:   07/26/1981            BSA:          2.161 m Patient  Age:    42 years             BP:           149/110 mmHg Patient Gender: F                    HR:           79 bpm. Exam Location:  [facility] Procedure: 2D Echo, Cardiac Doppler, Color Doppler and Strain Analysis Indications:     Acute myocardial infarction, unspecified I21.9  History:         Patient has prior history of Echocardiogram examinations. Risk                  Factors:Diabetes.  Sonographer:     Neysa Bonito Roar Referring Phys:  [id number] RUEAVWUJ A ARIDA Diagnosing Phys: Debbe Odea MD  Sonographer Comments: Global longitudinal strain was attempted. IMPRESSIONS  1. Left ventricular ejection fraction, by estimation, is 55 to 60%. The left ventricle has normal function. The left ventricle has no regional wall motion abnormalities. Left ventricular diastolic parameters were normal. The average left ventricular global longitudinal strain is -16.9 %. The global longitudinal strain is normal.  2. Right ventricular systolic function is normal. The right ventricular size is normal.  3. The mitral valve is normal in structure. No evidence of mitral valve regurgitation.  4. The aortic valve was not well visualized. Aortic valve regurgitation is not visualized.  5. The inferior vena cava is normal in size with greater than 50% respiratory variability, suggesting right atrial pressure of 3 mmHg. FINDINGS  Left Ventricle: Left ventricular ejection fraction, by estimation, is 55 to 60%. The left ventricle has normal function. The left ventricle has no regional wall motion abnormalities. The average left ventricular global longitudinal strain is -16.9 %. The global longitudinal strain is normal. The left ventricular internal cavity size was normal in size. There is no left ventricular hypertrophy. Left ventricular diastolic parameters were normal. Right Ventricle: The right ventricular size is normal. No increase in right ventricular wall thickness. Right ventricular systolic  function is normal. Left Atrium: Left atrial size was normal in size. Right Atrium: Right atrial size was normal in size. Pericardium: There is no evidence of pericardial effusion. Mitral Valve: The mitral valve is normal in structure. No evidence of mitral valve regurgitation. MV peak gradient, 2.8 mmHg. The mean mitral valve gradient is 1.0 mmHg. Tricuspid Valve: The tricuspid valve is normal in structure. Tricuspid valve regurgitation is not demonstrated. Aortic Valve: The aortic valve was not well visualized. Aortic valve regurgitation is not visualized. Aortic valve mean gradient measures 5.0 mmHg. Aortic valve peak gradient measures 8.2 mmHg. Aortic valve area, by VTI measures 2.98 cm. Pulmonic Valve: The pulmonic valve was not well visualized. Pulmonic valve regurgitation is not visualized. Aorta: The aortic root was not well visualized. Venous: The inferior vena cava is normal in size with greater than 50% respiratory variability, suggesting right atrial pressure of 3 mmHg. IAS/Shunts: No atrial level shunt detected by color flow Doppler.  LEFT VENTRICLE PLAX 2D LVIDd:         5.00 cm   Diastology LVIDs:         3.60 cm   LV e' medial:    8.81 cm/s LV PW:         1.00 cm   LV E/e' medial:  8.7 LV IVS:        1.00 cm   LV e' lateral:   12.20 cm/s  LVOT diam:     2.00 cm   LV E/e' lateral: 6.3 LV SV:         81 LV SV Index:   38        2D Longitudinal Strain LVOT Area:     3.14 cm  2D Strain GLS Avg:     -16.9 %  RIGHT VENTRICLE RV Basal diam:  2.80 cm RV Mid diam:    3.00 cm RV S prime:     10.80 cm/s TAPSE (M-mode): 2.1 cm LEFT ATRIUM             Index        RIGHT ATRIUM           Index LA diam:        3.70 cm 1.71 cm/m   RA Area:     12.70 cm LA Vol (A2C):   67.9 ml 31.42 ml/m  RA Volume:   27.80 ml  12.86 ml/m LA Vol (A4C):   52.2 ml 24.16 ml/m LA Biplane Vol: 60.0 ml 27.77 ml/m  AORTIC VALVE                     PULMONIC VALVE AV Area (Vmax):    2.83 cm      PV Vmax:        0.96 m/s AV Area  (Vmean):   2.47 cm      PV Peak grad:   3.7 mmHg AV Area (VTI):     2.98 cm      RVOT Peak grad: 3 mmHg AV Vmax:           143.00 cm/s AV Vmean:          104.000 cm/s AV VTI:            0.272 m AV Peak Grad:      8.2 mmHg AV Mean Grad:      5.0 mmHg LVOT Vmax:         129.00 cm/s LVOT Vmean:        81.800 cm/s LVOT VTI:          0.258 m LVOT/AV VTI ratio: 0.95 MITRAL VALVE MV Area (PHT): 4.39 cm    SHUNTS MV Area VTI:   3.27 cm    Systemic VTI:  0.26 m MV Peak grad:  2.8 mmHg    Systemic Diam: 2.00 cm MV Mean grad:  1.0 mmHg MV Vmax:       0.84 m/s MV Vmean:      57.0 cm/s MV Decel Time: 173 msec MV E velocity: 77.00 cm/s MV A velocity: 59.70 cm/s MV E/A ratio:  1.29 MV A Prime:    10.9 cm/s Debbe Odea MD Electronically signed by Debbe Odea MD Signature Date/Time: 07/21/2023/3:01:09 PM    Final    CARDIAC CATHETERIZATION  Result Date: 07/20/2023   Prox RCA to Mid RCA lesion is 30% stenosed.   RPDA lesion is 50% stenosed.   Dist Cx lesion is 90% stenosed.   3rd Mrg lesion is 70% stenosed.   Mid LAD lesion is 30% stenosed.   1st Diag lesion is 100% stenosed.   2nd Diag lesion is 100% stenosed.   Previously placed Ost LAD to Mid LAD stent of unknown type is  widely patent.   A drug-eluting stent was successfully placed using a STENT ONYX FRONTIER 2.5X18.   A drug-eluting stent was successfully placed using a STENT ONYX FRONTIER 2.0X15.   Post intervention, there is  a 0% residual stenosis.   Post intervention, there is a 0% residual stenosis.   There is mild to moderate left ventricular systolic dysfunction.   LV end diastolic pressure is moderately elevated.   The left ventricular ejection fraction is 35-45% by visual estimate. 1.  Patent LAD and RCA stents with mild in-stent restenosis.  Chronically occluded first and second diagonal.  80 to 90% hazy stenosis in the distal left circumflex at the bifurcation of an OM branch seems to be the culprit for inferior/inferolateral ST elevation  myocardial infarction. 2.  Mildly to moderately reduced LV systolic function with moderately elevated left ventricular end-diastolic pressure. 3.  Successful complex bifurcation angioplasty utilizing the reverse mini crush technique to the distal left circumflex/OM bifurcation.  Initially, a stent was placed in the distal left circumflex but there was loss of flow in the OM branch and thus I elected to treat given that the patient was highly symptomatic. Recommendations: Dual antiplatelet therapy for at least 12 months and preferably indefinitely. Aggrastat infusion for 12 hours given risk of stent thrombosis in the setting of vomiting. Nitroglycerin drip for pain control. Check urine drug screen. Aggressive treatment of risk factors and smoking cessation.      Labs: BNP (last 3 results) Recent Labs    02/19/23 1450 08/10/23 1140  BNP 13.5 280.9*   Basic Metabolic Panel: Recent Labs  Lab 08/10/23 0956 08/11/23 0709  NA 131* 136  K 3.8 3.4*  CL 101 104  CO2 16* 22  GLUCOSE 360* 155*  BUN 15 15  CREATININE 0.55 0.48  CALCIUM 8.6* 8.5*   Liver Function Tests: Recent Labs  Lab 08/11/23 0709  AST 18  ALT 17  ALKPHOS 67  BILITOT 0.4  PROT 6.0*  ALBUMIN 3.1*   No results for input(s): "LIPASE", "AMYLASE" in the last 168 hours. No results for input(s): "AMMONIA" in the last 168 hours. CBC: Recent Labs  Lab 08/10/23 0956 08/11/23 0709  WBC 14.6* 9.5  NEUTROABS  --  6.3  HGB 11.9* 9.8*  HCT 41.8 31.3*  MCV 80.1 73.1*  PLT 473* 362   Cardiac Enzymes: Recent Labs  Lab 08/10/23 1450  CKTOTAL 38   BNP: Invalid input(s): "POCBNP" CBG: Recent Labs  Lab 08/10/23 1355 08/10/23 1658 08/10/23 2139 08/11/23 0746 08/11/23 1151  GLUCAP 266* 283* 183* 161* 152*   D-Dimer No results for input(s): "DDIMER" in the last 72 hours. Hgb A1c No results for input(s): "HGBA1C" in the last 72 hours. Lipid Profile No results for input(s): "CHOL", "HDL", "LDLCALC", "TRIG",  "CHOLHDL", "LDLDIRECT" in the last 72 hours. Thyroid function studies No results for input(s): "TSH", "T4TOTAL", "T3FREE", "THYROIDAB" in the last 72 hours.  Invalid input(s): "FREET3" Anemia work up No results for input(s): "VITAMINB12", "FOLATE", "FERRITIN", "TIBC", "IRON", "RETICCTPCT" in the last 72 hours. Urinalysis    Component Value Date/Time   COLORURINE YELLOW (A) 11/21/2021 1400   APPEARANCEUR HAZY (A) 11/21/2021 1400   LABSPEC 1.036 (H) 11/21/2021 1400   PHURINE 8.0 11/21/2021 1400   GLUCOSEU >=500 (A) 11/21/2021 1400   HGBUR NEGATIVE 11/21/2021 1400   BILIRUBINUR NEGATIVE 11/21/2021 1400   KETONESUR 80 (A) 11/21/2021 1400   PROTEINUR NEGATIVE 11/21/2021 1400   NITRITE NEGATIVE 11/21/2021 1400   LEUKOCYTESUR NEGATIVE 11/21/2021 1400   Sepsis Labs Recent Labs  Lab 08/10/23 0956 08/11/23 0709  WBC 14.6* 9.5   Microbiology No results found for this or any previous visit (from the past 240 hour(s)).   No charge note.  Darlin Priestly, MD  [facility] 08/12/2023, 7:41 AM

## 2023-08-26 ENCOUNTER — Telehealth: Payer: Self-pay | Admitting: Pharmacist

## 2023-08-26 NOTE — Telephone Encounter (Signed)
-----   Message from Winslow West sent at 08/26/2023  8:42 AM EST ----- She has history of poor follow-up and adherence to medications.  The reason I wanted her to be on Repatha is due to her known history of severe gastroparesis with poor absorption of medications, severely elevated LDL and elevated LP(a). I would not prescribe anything yet unless she is following up with our office.  Her cardiologist is at [facility].  Thanks. ----- Message ----- From: Olene Floss, RPH-CPP Sent: 08/16/2023  12:47 PM EST To: Iran Ouch, MD  This pt has been seen at [facility] and [facility] multiple times for chest pain. You sent in Repatha, but it was denied bc she hast tried zetia first. It doesn't appear she is taking her atorvastatin. She does not have a f/u apt scheduled. I dont think we will win appeals if we can't prove patient is even taking statin, let alone bypassing zetia. I really think patient needs to be schedule in clinic and try to get her to take both atorvastatin and zetia before we can get Repatha approved. That is, if she will show up. Thoughts?   Karen Ruiz

## 2023-08-26 NOTE — Telephone Encounter (Signed)
Appeals not pursed due to patient not being actively followed by our practice.

## 2023-09-19 ENCOUNTER — Emergency Department
Admission: EM | Admit: 2023-09-19 | Discharge: 2023-09-19 | Disposition: A | Discharge status: Home / Self Care | Payer: Medicaid Other | Attending: Emergency Medicine | Admitting: Emergency Medicine

## 2023-09-19 ENCOUNTER — Emergency Department: Payer: Medicaid Other

## 2023-09-19 ENCOUNTER — Other Ambulatory Visit: Payer: Self-pay

## 2023-09-19 DIAGNOSIS — D72829 Elevated white blood cell count, unspecified: Secondary | ICD-10-CM | POA: Insufficient documentation

## 2023-09-19 DIAGNOSIS — K3184 Gastroparesis: Secondary | ICD-10-CM | POA: Insufficient documentation

## 2023-09-19 DIAGNOSIS — E119 Type 2 diabetes mellitus without complications: Secondary | ICD-10-CM | POA: Insufficient documentation

## 2023-09-19 DIAGNOSIS — I2510 Atherosclerotic heart disease of native coronary artery without angina pectoris: Secondary | ICD-10-CM | POA: Insufficient documentation

## 2023-09-19 DIAGNOSIS — I10 Essential (primary) hypertension: Secondary | ICD-10-CM | POA: Diagnosis not present

## 2023-09-19 DIAGNOSIS — R109 Unspecified abdominal pain: Secondary | ICD-10-CM | POA: Diagnosis present

## 2023-09-19 LAB — TROPONIN I (HIGH SENSITIVITY)
Troponin I (High Sensitivity): 18 ng/L — ABNORMAL HIGH (ref <18)
Troponin I (High Sensitivity): 14 ng/L (ref <18)

## 2023-09-19 LAB — CBC
HCT: 38.1 % (ref 36.0–46.0)
Hemoglobin: 11.4 g/dL — ABNORMAL LOW (ref 12.0–15.0)
MCH: 22.3 pg — ABNORMAL LOW (ref 26.0–34.0)
MCHC: 29.9 g/dL — ABNORMAL LOW (ref 30.0–36.0)
MCV: 74.4 fL — ABNORMAL LOW (ref 80.0–100.0)
Platelets: 404 10*3/uL — ABNORMAL HIGH (ref 150–400)
RBC: 5.12 MIL/uL — ABNORMAL HIGH (ref 3.87–5.11)
RDW: 17.2 % — ABNORMAL HIGH (ref 11.5–15.5)
WBC: 14.1 10*3/uL — ABNORMAL HIGH (ref 4.0–10.5)
nRBC: 0 % (ref 0.0–0.2)

## 2023-09-19 LAB — COMPREHENSIVE METABOLIC PANEL
ALT: 21 U/L (ref 0–44)
AST: 23 U/L (ref 15–41)
Albumin: 3.7 g/dL (ref 3.5–5.0)
Alkaline Phosphatase: 88 U/L (ref 38–126)
Anion gap: 10 (ref 5–15)
BUN: 15 mg/dL (ref 6–20)
CO2: 23 mmol/L (ref 22–32)
Calcium: 8.8 mg/dL — ABNORMAL LOW (ref 8.9–10.3)
Chloride: 101 mmol/L (ref 98–111)
Creatinine, Ser: 0.56 mg/dL (ref 0.44–1.00)
GFR, Estimated: >60 mL/min (ref >60)
Glucose, Bld: 228 mg/dL — ABNORMAL HIGH (ref 70–99)
Potassium: 3.9 mmol/L (ref 3.5–5.1)
Sodium: 134 mmol/L — ABNORMAL LOW (ref 135–145)
Total Bilirubin: 0.6 mg/dL (ref <1.2)
Total Protein: 7.6 g/dL (ref 6.5–8.1)

## 2023-09-19 LAB — HCG, QUANTITATIVE, PREGNANCY: hCG, Beta Chain, Quant, S: 1 m[IU]/mL (ref <5)

## 2023-09-19 LAB — LIPASE, BLOOD: Lipase: 24 U/L (ref 11–51)

## 2023-09-19 MED ORDER — HYDROMORPHONE HCL 1 MG/ML IJ SOLN
1.0000 mg | Freq: Once | INTRAMUSCULAR | Status: AC
  Administered 2023-09-19: 1 mg via INTRAVENOUS
  Filled 2023-09-19: qty 1

## 2023-09-19 MED ORDER — ONDANSETRON HCL 4 MG/2ML IJ SOLN
4.0000 mg | Freq: Once | INTRAMUSCULAR | Status: AC
  Administered 2023-09-19: 4 mg via INTRAVENOUS
  Filled 2023-09-19: qty 2

## 2023-09-19 MED ORDER — METOCLOPRAMIDE HCL 5 MG/ML IJ SOLN
10.0000 mg | Freq: Once | INTRAMUSCULAR | Status: AC
  Administered 2023-09-19: 10 mg via INTRAVENOUS
  Filled 2023-09-19: qty 2

## 2023-09-19 NOTE — ED Provider Notes (Signed)
[facility] Provider Note    Event Date/Time   First MD Initiated Contact with Patient 09/19/23 1009     (approximate)   History   Chest Pain and Abdominal Pain   HPI  Karen Ruiz is a 42 y.o. female  CAD s/p recent inferior lateral STEMI s/p PCI to distal Lcx/OM branches complicated by subacute stent thrombosis, uncontrolled type 2 diabetes, hypertension, hyperlipidemia, polysubstance abuse who comes in with chest pain, abdominal pain.  This is her to typical gastroperesis causing nausea and vomiting which then causes her to have her chest pain.  She reports being compliant with all her medications.  Denies any falls and hitting her head.  Patient had prior gastroparesis flare and has been on Reglan.  She was admitted on 09/19/2023 for stent placement  Physical Exam   Triage Vital Signs: ED Triage Vitals [09/19/23 0952]  Encounter Vitals Group     BP (!) 142/100     Systolic BP Percentile      Diastolic BP Percentile      Pulse Rate (!) 101     Resp (!) 26     Temp 98.7 F (37.1 C)     Temp Source Oral     SpO2 100 %     Weight      Height      Head Circumference      Peak Flow      Pain Score      Pain Loc      Pain Education      Exclude from Growth Chart     Most recent vital signs: Vitals:   09/19/23 0952  BP: (!) 142/100  Pulse: (!) 101  Resp: (!) 26  Temp: 98.7 F (37.1 C)  SpO2: 100%     General: Awake, no distress.  CV:  Good peripheral perfusion.  Resp:  Normal effort.  Abd:  No distention.  Soft and nontender Other:  Patient is moaning   ED Results / Procedures / Treatments   Labs (all labs ordered are listed, but only abnormal results are displayed) Labs Reviewed  BASIC METABOLIC PANEL  CBC  POC URINE PREG, ED  TROPONIN I (HIGH SENSITIVITY)     EKG  My interpretation of EKG:  Normal sinus rate of 100 without any ST elevation or T wave inversions, normal intervals  RADIOLOGY I have reviewed  the xray personally and interpreted and no pneumonia  PROCEDURES:  Critical Care performed: No  .1-3 Lead EKG Interpretation  Performed by: Concha Se, MD Authorized by: Concha Se, MD     Interpretation: normal     ECG rate:  70   ECG rate assessment: normal     Rhythm: sinus rhythm     Ectopy: none     Conduction: normal      MEDICATIONS ORDERED IN ED: Medications  ondansetron (ZOFRAN) injection 4 mg (4 mg Intravenous Given 09/19/23 1002)  HYDROmorphone (DILAUDID) injection 1 mg (1 mg Intravenous Given 09/19/23 1030)  metoCLOPramide (REGLAN) injection 10 mg (10 mg Intravenous Given 09/19/23 1030)     IMPRESSION / MDM / ASSESSMENT AND PLAN / ED COURSE  I reviewed the triage vital signs and the nursing notes.   Patient's presentation is most consistent with acute presentation with potential threat to life or bodily function.   Patient comes in with concerns for nausea vomiting abdominal pain that seems consistent with her known gastroparesis.  Her abdomen is soft and nontender.  Will get cardiac markers given her frequent presentations and recent NSTEMI requiring stent placement.  She has had multiple CT dissections previously that have been negative.  We will give 1 dose of IV Dilaudid, IV Reglan while awaiting workup  11:26 AM  Reevaluated patient abdomen soft nontender discussed CT imaging but of opted to hold off given she reports this has been a chronic issue for her and symptoms feel similar to her prior.  She reports feeling much better now.  She is requesting opioid prescription outpatient.  Discussed that this is not indicated for chronic pain that she is to follow-up with her primary care doctor versus pain clinic.  She expressed understanding.  Lipase normal.  CBC shows elevated white count most likely reactive from her nausea, vomiting.  Her initial troponin is 18 which is the lowest that has been most recently.  Will get a repeat.  Her CMP shows elevated  glucose but no signs of DKA.  12:58 PM repeat evaluation patient remains asymptomatic.  Her repeat troponin is downtrending.  Doubt ACS at this time.  Patient repeat abdominal exam remains soft and nontender she feels comfortable with discharge home.  She declines needing new Reglan    The patient is on the cardiac monitor to evaluate for evidence of arrhythmia and/or significant heart rate changes.      FINAL CLINICAL IMPRESSION(S) / ED DIAGNOSES   Final diagnoses:  Gastroparesis     Rx / DC Orders   ED Discharge Orders     None        Note:  This document was prepared using Dragon voice recognition software and may include unintentional dictation errors.   Concha Se, MD 09/19/23 [phone]

## 2023-09-19 NOTE — ED Notes (Signed)
When this RN entered the room to administer medications the patient asked "what are you giving me?" This RN replied "Reglan for nausea and Hydromorphone for pain." Patient replied "No I can't have those together, they make me too jittery." Husband replied "We've never even heard of that medication". When this RN stated "Oh, I was unaware you couldn't have Dilaudid. I'll make the ED doctor aware." Patient then replied "oh no, just give it to me. Give it to me now."

## 2023-09-19 NOTE — ED Notes (Signed)
 Patient discharged from ED by provider. Discharge instructions reviewed with patient and all questions answered. Patient ambulatory from ED in NAD.

## 2023-09-19 NOTE — ED Triage Notes (Signed)
Pt here with cp that started this morning. Pt states pain is centered and radiates to her back. Pt also having generalized abd pain. Pt moaning and thrashing around in wheelchair despite being told that ED staff needs to complete an EKG. Pt denies NVD. Pt has hx of a cardiac stent x1 month ago.

## 2023-09-19 NOTE — Discharge Instructions (Signed)
Return to the ER if develop fevers worsening symptoms or any other concerns but at this time you are stable for discharge home

## 2023-09-26 ENCOUNTER — Emergency Department: Payer: Medicaid Other

## 2023-09-26 ENCOUNTER — Emergency Department
Admission: EM | Admit: 2023-09-26 | Discharge: 2023-09-26 | Disposition: A | Discharge status: Home / Self Care | Payer: Medicaid Other | Attending: Emergency Medicine | Admitting: Emergency Medicine

## 2023-09-26 ENCOUNTER — Other Ambulatory Visit: Payer: Self-pay

## 2023-09-26 DIAGNOSIS — R7989 Other specified abnormal findings of blood chemistry: Secondary | ICD-10-CM | POA: Diagnosis not present

## 2023-09-26 DIAGNOSIS — I2510 Atherosclerotic heart disease of native coronary artery without angina pectoris: Secondary | ICD-10-CM | POA: Insufficient documentation

## 2023-09-26 DIAGNOSIS — R109 Unspecified abdominal pain: Secondary | ICD-10-CM | POA: Diagnosis present

## 2023-09-26 DIAGNOSIS — R112 Nausea with vomiting, unspecified: Secondary | ICD-10-CM | POA: Diagnosis not present

## 2023-09-26 DIAGNOSIS — R1084 Generalized abdominal pain: Secondary | ICD-10-CM | POA: Diagnosis not present

## 2023-09-26 LAB — COMPREHENSIVE METABOLIC PANEL WITH GFR
ALT: 22 U/L (ref 0–44)
AST: 22 U/L (ref 15–41)
Albumin: 4.1 g/dL (ref 3.5–5.0)
Alkaline Phosphatase: 90 U/L (ref 38–126)
Anion gap: 14 (ref 5–15)
BUN: 13 mg/dL (ref 6–20)
CO2: 21 mmol/L — ABNORMAL LOW (ref 22–32)
Calcium: 9.1 mg/dL (ref 8.9–10.3)
Chloride: 98 mmol/L (ref 98–111)
Creatinine, Ser: 0.47 mg/dL (ref 0.44–1.00)
GFR, Estimated: >60 mL/min
Glucose, Bld: 268 mg/dL — ABNORMAL HIGH (ref 70–99)
Potassium: 3.6 mmol/L (ref 3.5–5.1)
Sodium: 133 mmol/L — ABNORMAL LOW (ref 135–145)
Total Bilirubin: 0.8 mg/dL
Total Protein: 8.2 g/dL — ABNORMAL HIGH (ref 6.5–8.1)

## 2023-09-26 LAB — CBC
HCT: 36.6 % (ref 36.0–46.0)
Hemoglobin: 11.2 g/dL — ABNORMAL LOW (ref 12.0–15.0)
MCH: 22.4 pg — ABNORMAL LOW (ref 26.0–34.0)
MCHC: 30.6 g/dL (ref 30.0–36.0)
MCV: 73.3 fL — ABNORMAL LOW (ref 80.0–100.0)
Platelets: 427 10*3/uL — ABNORMAL HIGH (ref 150–400)
RBC: 4.99 MIL/uL (ref 3.87–5.11)
RDW: 17.2 % — ABNORMAL HIGH (ref 11.5–15.5)
WBC: 16.3 10*3/uL — ABNORMAL HIGH (ref 4.0–10.5)
nRBC: 0 % (ref 0.0–0.2)

## 2023-09-26 LAB — TROPONIN I (HIGH SENSITIVITY)
Troponin I (High Sensitivity): 23 ng/L — ABNORMAL HIGH
Troponin I (High Sensitivity): 21 ng/L — ABNORMAL HIGH (ref <18)

## 2023-09-26 LAB — LIPASE, BLOOD: Lipase: 21 U/L (ref 11–51)

## 2023-09-26 MED ORDER — SODIUM CHLORIDE 0.9 % IV BOLUS
500.0000 mL | Freq: Once | INTRAVENOUS | Status: AC
  Administered 2023-09-26: 500 mL via INTRAVENOUS

## 2023-09-26 MED ORDER — KETOROLAC TROMETHAMINE 30 MG/ML IJ SOLN
15.0000 mg | Freq: Once | INTRAMUSCULAR | Status: AC
  Administered 2023-09-26: 15 mg via INTRAVENOUS
  Filled 2023-09-26: qty 1

## 2023-09-26 MED ORDER — DROPERIDOL 2.5 MG/ML IJ SOLN
2.5000 mg | Freq: Once | INTRAMUSCULAR | Status: AC
  Administered 2023-09-26: 2.5 mg via INTRAVENOUS
  Filled 2023-09-26: qty 2

## 2023-09-26 MED ORDER — DIPHENHYDRAMINE HCL 50 MG/ML IJ SOLN
INTRAMUSCULAR | Status: AC
  Administered 2023-09-26: 25 mg via INTRAVENOUS
  Filled 2023-09-26: qty 1

## 2023-09-26 MED ORDER — DIPHENHYDRAMINE HCL 50 MG/ML IJ SOLN
25.0000 mg | INTRAMUSCULAR | Status: AC
  Filled 2023-09-26: qty 1

## 2023-09-26 MED ORDER — MELOXICAM 15 MG PO TABS: 15.0000 mg | ORAL_TABLET | Freq: Every day | ORAL | 0 refills | Status: AC

## 2023-09-26 MED ORDER — PROMETHAZINE HCL 25 MG PO TABS: 25.0000 mg | ORAL_TABLET | Freq: Four times a day (QID) | ORAL | 0 refills | Status: AC | PRN

## 2023-09-26 MED ORDER — PROMETHAZINE HCL 25 MG RE SUPP: 25.0000 mg | Freq: Three times a day (TID) | RECTAL | 0 refills | Status: AC | PRN

## 2023-09-26 NOTE — ED Notes (Signed)
FIRST NURSE NOTE:  Pt arrived via [facility] from home with reports of chest pain and abdominal pain, hx of stents, p 110  Elevated BP Hx of gastroparesis N/V Takes blood thinner EMS unable to get IV 100 mcg Fentanyl IM 324 ASA

## 2023-09-26 NOTE — ED Notes (Signed)
2nd troponin sent

## 2023-09-26 NOTE — ED Notes (Signed)
Pt hx of abd discomfort pt arrives via ems from home c/c abd pain unresolved by home medication pt reports ems attempts PIV initiation x3 unsuccessful and admin of Fentanyl order for pain given IM per pt report pt states Fentanyl admin is resolving pain in abd states pain is a 3/10 @ this time 20g piv initiated to RAC all medications ordered admin pt provided pillow and blanket for comfort call light in place pt placed on cardiac monitor lights off for less stimulated environment pt speaking in full clear sentences denies further needs @ this time denies CP denies SOB appears in nad

## 2023-09-26 NOTE — ED Triage Notes (Signed)
Pt moaning and yelling in triage. Pt reports chest pain and abd pain "all day that just keeps getting worse and worse".

## 2023-09-26 NOTE — ED Provider Notes (Signed)
[facility] Provider Note    Event Date/Time   First MD Initiated Contact with Patient 09/26/23 [phone]     (approximate)   History   Abdominal Pain and Chest Pain   HPI Karen Ruiz is a 42 y.o. female with a complicated medical history that includes CAD/ACS including a reoccluded stent that required catheterization about 2 months ago, history of recurrent abdominal pain thought to be due to diabetic gastroparesis, as well as borderline personality disorder and a history of polysubstance abuse although she says she has been clean for quite a while.  She presents tonight for abdominal pain that she says feels like the abdominal pain she has had in the past.  It is all over her abdomen, not located in 1 place.  It is caused numerous episodes of nausea and vomiting.  She said that it started more than a day ago and she thought she could be tough and wait it out but the pain made her "miss my kids' Christmas".  She states that because she has been "vomiting and screaming", she thinks that it has made her throat and chest is sore.  The pain is better now than it was when she first came in when she was retching and yelling upon arrival, but she said that she can still feel it.  She denies any pain in her chest other than except as previously described, not like when she was having a heart attack.  No recent fever.     Physical Exam   Triage Vital Signs: ED Triage Vitals  Encounter Vitals Group     BP 09/26/23 0140 (!) 166/109     Systolic BP Percentile --      Diastolic BP Percentile --      Pulse Rate 09/26/23 0140 (!) 120     Resp 09/26/23 0140 20     Temp 09/26/23 0140 98.3 F (36.8 C)     Temp Source 09/26/23 0140 Oral     SpO2 09/26/23 0140 97 %     Weight 09/26/23 0136 108.9 kg (240 lb)     Height 09/26/23 0136 1.676 m (5\' 6" )     Head Circumference --      Peak Flow --      Pain Score 09/26/23 0136 10     Pain Loc --      Pain Education --       Exclude from Growth Chart --     Most recent vital signs: Vitals:   09/26/23 0644 09/26/23 0645  BP: 124/66 124/66  Pulse: 96 95  Resp: 16 15  Temp: 98.1 F (36.7 C)   SpO2: 96% 94%    General: Awake, no obvious distress at this time. CV:  Good peripheral perfusion.  Tachycardia initially, now improved but still slightly tachycardic. Resp:  Normal effort. Speaking easily and comfortably, no accessory muscle usage nor intercostal retractions.   Abd:  No distention.  Obese.  No tenderness elicited with palpation.  The patient says that it hurts on the inside but not when I push on it.   ED Results / Procedures / Treatments   Labs (all labs ordered are listed, but only abnormal results are displayed) Labs Reviewed  COMPREHENSIVE METABOLIC PANEL - Abnormal; Notable for the following components:      Result Value   Sodium 133 (*)    CO2 21 (*)    Glucose, Bld 268 (*)    Total Protein 8.2 (*)  All other components within normal limits  CBC - Abnormal; Notable for the following components:   WBC 16.3 (*)    Hemoglobin 11.2 (*)    MCV 73.3 (*)    MCH 22.4 (*)    RDW 17.2 (*)    Platelets 427 (*)    All other components within normal limits  TROPONIN I (HIGH SENSITIVITY) - Abnormal; Notable for the following components:   Troponin I (High Sensitivity) 23 (*)    All other components within normal limits  LIPASE, BLOOD  URINALYSIS, ROUTINE W REFLEX MICROSCOPIC  POC URINE PREG, ED  TROPONIN I (HIGH SENSITIVITY)     EKG  ED ECG REPORT I, Loleta Rose, the attending physician, personally viewed and interpreted this ECG.  Date: 09/26/2023 EKG Time: 1:39 AM Rate: 116 Rhythm: Sinus tachycardia QRS Axis: normal Intervals: normal ST/T Wave abnormalities: Non-specific ST segment / T-wave changes, but no clear evidence of acute ischemia. Narrative Interpretation: no definitive evidence of acute ischemia; does not meet STEMI criteria.    RADIOLOGY I viewed and  interpreted the patient's 1-view CXR.  No pneumonia nor pneumothorax.  Radiologist confirms normal study.   PROCEDURES:  Critical Care performed: No  .1-3 Lead EKG Interpretation  Performed by: Loleta Rose, MD Authorized by: Loleta Rose, MD     Interpretation: abnormal     ECG rate:  100   ECG rate assessment: tachycardic     Rhythm: sinus tachycardia     Ectopy: none     Conduction: normal       IMPRESSION / MDM / ASSESSMENT AND PLAN / ED COURSE  I reviewed the triage vital signs and the nursing notes.                              Differential diagnosis includes, but is not limited to, cyclic vomiting, diabetic gastroparesis, cannabinoid hyperemesis syndrome, ACS, PE, viral illness, pancreatitis, biliary colic.  Patient's presentation is most consistent with acute presentation with potential threat to life or bodily function.  Labs/studies ordered: EKG, high-sensitivity troponin x 2, comprehensive metabolic panel, lipase, CBC, 1 view chest x-ray  Interventions/Medications given:  Medications  droperidol (INAPSINE) 2.5 MG/ML injection 2.5 mg (2.5 mg Intravenous Given 09/26/23 0625)  ketorolac (TORADOL) 30 MG/ML injection 15 mg (15 mg Intravenous Given 09/26/23 0626)  diphenhydrAMINE (BENADRYL) injection 25 mg (25 mg Intravenous Given 09/26/23 0626)  sodium chloride 0.9 % bolus 500 mL (500 mLs Intravenous New Bag/Given 09/26/23 0626)    (Note:  hospital course my include additional interventions and/or labs/studies not listed above.)   When the patient arrived, she was heaving and screaming in a manner that is most consistent with cyclic vomiting syndrome or cannabinoid hyperemesis syndrome.  However, while she was waiting room, she feels sleep, she seems much more comfortable at this time without any treatment.  I verified in the medical record that her history of heart disease is accurate and I read over the catheterization note written by Dr. Kirke Corin on 07/23/2023  indicating extensive multivessel disease.  However, the pain she is describing tonight is in her abdomen and more consistent with her history of gastroparesis.  However, I think it is unlikely that she has gastroparesis or an SBO/ileus at this time given that she has no elicitable tenderness to palpation of the abdomen currently and she is much more comfortable than she was before.  EKG is unremarkable but her high-sensitivity troponin is up slightly 23.  This may be due to her rapid rate that she was experiencing earlier.  Similarly I think her leukocytosis of 16 is reactive and is also consistent with prior values including on a prior hospitalization.  CMP is reassuring.  I recommended that we give her some medicine to help settle her stomach and make her feel better while checking another troponin.  She told me that "what ever cocktail you all gave me in the past didn't work but pain medicine works."  I told her that I think the best plan is to try droperidol, Toradol, and Benadryl, and she was willing to give this a try (doses all listed above.  I am also giving her a small fluid bolus of 500 mL given her tachycardia upon arrival.  At this point I doubt that she would benefit from emergent imaging given no tenderness to palpation of the abdomen, but I will reassess after her medications and after the repeat high-sensitivity troponin.  The patient is on the cardiac monitor to evaluate for evidence of arrhythmia and/or significant heart rate changes.   Clinical Course as of 09/26/23 0716  Thu Sep 26, 2023  0704 Transferred ED care to Dr. Vicente Males who will reassess after the second troponin to determine the appropriate disposition. [CF]    Clinical Course User Index [CF] Loleta Rose, MD     FINAL CLINICAL IMPRESSION(S) / ED DIAGNOSES   Final diagnoses:  Generalized abdominal pain  Elevated troponin level  Nausea and vomiting, unspecified vomiting type     Rx / DC Orders   ED Discharge  Orders     None        Note:  This document was prepared using Dragon voice recognition software and may include unintentional dictation errors.   Loleta Rose, MD 09/26/23 [phone]

## 2023-09-26 NOTE — ED Provider Notes (Signed)
Emergency department handoff note  Care of this patient was signed out to me at the end of the previous provider shift.  All pertinent patient information was conveyed and all questions were answered.  Patient pending repeat of patient's troponin which is downtrending.  The patient has been reexamined and is ready to be discharged.  All diagnostic results have been reviewed and discussed with the patient/family.  Care plan has been outlined and the patient/family understands all current diagnoses, results, and treatment plans.  There are no new complaints, changes, or physical findings at this time.  All questions have been addressed and answered.  All medications, if any, that were given while in the emergency department or any that are being prescribed have been reviewed with the patient/family.  All side effects and adverse reactions have been explained.  Patient was instructed to, and agrees to follow-up with their primary care physician as well as return to the emergency department if any new or worsening symptoms develop.   Merwyn Katos, MD 09/26/23 [phone]

## 2023-09-26 NOTE — ED Notes (Signed)
Pt resting quietly in subwait in recliner

## 2023-11-06 ENCOUNTER — Other Ambulatory Visit: Payer: Self-pay

## 2023-11-06 ENCOUNTER — Emergency Department: Payer: Medicaid Other

## 2023-11-06 ENCOUNTER — Observation Stay
Admission: EM | Admit: 2023-11-06 | Discharge: 2023-11-06 | Disposition: A | Discharge status: Home / Self Care | Payer: Medicaid Other | Attending: Internal Medicine | Admitting: Internal Medicine

## 2023-11-06 ENCOUNTER — Inpatient Hospital Stay: Payer: Medicaid Other

## 2023-11-06 DIAGNOSIS — F1210 Cannabis abuse, uncomplicated: Secondary | ICD-10-CM | POA: Insufficient documentation

## 2023-11-06 DIAGNOSIS — R0602 Shortness of breath: Secondary | ICD-10-CM | POA: Insufficient documentation

## 2023-11-06 DIAGNOSIS — Z7984 Long term (current) use of oral hypoglycemic drugs: Secondary | ICD-10-CM | POA: Diagnosis not present

## 2023-11-06 DIAGNOSIS — R112 Nausea with vomiting, unspecified: Secondary | ICD-10-CM | POA: Diagnosis present

## 2023-11-06 DIAGNOSIS — I2510 Atherosclerotic heart disease of native coronary artery without angina pectoris: Secondary | ICD-10-CM | POA: Diagnosis not present

## 2023-11-06 DIAGNOSIS — E1143 Type 2 diabetes mellitus with diabetic autonomic (poly)neuropathy: Secondary | ICD-10-CM | POA: Diagnosis not present

## 2023-11-06 DIAGNOSIS — E785 Hyperlipidemia, unspecified: Secondary | ICD-10-CM | POA: Diagnosis present

## 2023-11-06 DIAGNOSIS — Z794 Long term (current) use of insulin: Secondary | ICD-10-CM | POA: Insufficient documentation

## 2023-11-06 DIAGNOSIS — Z79899 Other long term (current) drug therapy: Secondary | ICD-10-CM | POA: Insufficient documentation

## 2023-11-06 DIAGNOSIS — E782 Mixed hyperlipidemia: Secondary | ICD-10-CM | POA: Diagnosis present

## 2023-11-06 DIAGNOSIS — I10 Essential (primary) hypertension: Secondary | ICD-10-CM | POA: Diagnosis present

## 2023-11-06 DIAGNOSIS — E1165 Type 2 diabetes mellitus with hyperglycemia: Secondary | ICD-10-CM | POA: Diagnosis present

## 2023-11-06 DIAGNOSIS — R079 Chest pain, unspecified: Secondary | ICD-10-CM | POA: Diagnosis not present

## 2023-11-06 DIAGNOSIS — Z955 Presence of coronary angioplasty implant and graft: Secondary | ICD-10-CM | POA: Diagnosis not present

## 2023-11-06 DIAGNOSIS — Z7982 Long term (current) use of aspirin: Secondary | ICD-10-CM | POA: Insufficient documentation

## 2023-11-06 DIAGNOSIS — F1910 Other psychoactive substance abuse, uncomplicated: Secondary | ICD-10-CM | POA: Diagnosis present

## 2023-11-06 DIAGNOSIS — R109 Unspecified abdominal pain: Secondary | ICD-10-CM

## 2023-11-06 DIAGNOSIS — K3184 Gastroparesis: Principal | ICD-10-CM | POA: Diagnosis present

## 2023-11-06 DIAGNOSIS — R1084 Generalized abdominal pain: Secondary | ICD-10-CM | POA: Diagnosis present

## 2023-11-06 DIAGNOSIS — Z720 Tobacco use: Secondary | ICD-10-CM | POA: Diagnosis present

## 2023-11-06 LAB — URINE DRUG SCREEN, QUALITATIVE (ARMC ONLY)
Amphetamines, Ur Screen: NOT DETECTED
Barbiturates, Ur Screen: NOT DETECTED
Benzodiazepine, Ur Scrn: NOT DETECTED
Cannabinoid 50 Ng, Ur ~~LOC~~: POSITIVE — AB
Cocaine Metabolite,Ur ~~LOC~~: NOT DETECTED
MDMA (Ecstasy)Ur Screen: NOT DETECTED
Methadone Scn, Ur: NOT DETECTED
Opiate, Ur Screen: NOT DETECTED
Phencyclidine (PCP) Ur S: NOT DETECTED
Tricyclic, Ur Screen: NOT DETECTED

## 2023-11-06 LAB — COMPREHENSIVE METABOLIC PANEL
ALT: 25 U/L (ref 0–44)
AST: 21 U/L (ref 15–41)
Albumin: 3.6 g/dL (ref 3.5–5.0)
Alkaline Phosphatase: 76 U/L (ref 38–126)
Anion gap: 10 (ref 5–15)
BUN: 16 mg/dL (ref 6–20)
CO2: 20 mmol/L — ABNORMAL LOW (ref 22–32)
Calcium: 8.2 mg/dL — ABNORMAL LOW (ref 8.9–10.3)
Chloride: 106 mmol/L (ref 98–111)
Creatinine, Ser: 0.57 mg/dL (ref 0.44–1.00)
GFR, Estimated: >60 mL/min (ref >60)
Glucose, Bld: 284 mg/dL — ABNORMAL HIGH (ref 70–99)
Potassium: 3.5 mmol/L (ref 3.5–5.1)
Sodium: 136 mmol/L (ref 135–145)
Total Bilirubin: <0.2 mg/dL (ref 0.0–1.2)
Total Protein: 7.3 g/dL (ref 6.5–8.1)

## 2023-11-06 LAB — LIPASE, BLOOD: Lipase: 23 U/L (ref 11–51)

## 2023-11-06 LAB — URINALYSIS, ROUTINE W REFLEX MICROSCOPIC
Bacteria, UA: NONE SEEN
Bilirubin Urine: NEGATIVE
Glucose, UA: 150 mg/dL — AB
Hgb urine dipstick: NEGATIVE
Ketones, ur: NEGATIVE mg/dL
Nitrite: NEGATIVE
Protein, ur: 30 mg/dL — AB
Specific Gravity, Urine: 1.033 — ABNORMAL HIGH (ref 1.005–1.030)
pH: 5 (ref 5.0–8.0)

## 2023-11-06 LAB — TROPONIN I (HIGH SENSITIVITY)
Troponin I (High Sensitivity): 11 ng/L (ref <18)
Troponin I (High Sensitivity): 11 ng/L (ref <18)

## 2023-11-06 LAB — PREGNANCY, URINE: Preg Test, Ur: NEGATIVE

## 2023-11-06 LAB — CBC
HCT: 38.1 % (ref 36.0–46.0)
Hemoglobin: 11.7 g/dL — ABNORMAL LOW (ref 12.0–15.0)
MCH: 22.2 pg — ABNORMAL LOW (ref 26.0–34.0)
MCHC: 30.7 g/dL (ref 30.0–36.0)
MCV: 72.2 fL — ABNORMAL LOW (ref 80.0–100.0)
Platelets: 293 10*3/uL (ref 150–400)
RBC: 5.28 MIL/uL — ABNORMAL HIGH (ref 3.87–5.11)
RDW: 17.2 % — ABNORMAL HIGH (ref 11.5–15.5)
WBC: 7 10*3/uL (ref 4.0–10.5)
nRBC: 0 % (ref 0.0–0.2)

## 2023-11-06 LAB — CBG MONITORING, ED: Glucose-Capillary: 298 mg/dL — ABNORMAL HIGH (ref 70–99)

## 2023-11-06 MED ORDER — ASPIRIN 81 MG PO TBEC
81.0000 mg | DELAYED_RELEASE_TABLET | Freq: Every day | ORAL | Status: DC
Start: 2023-11-07 — End: 2023-11-07

## 2023-11-06 MED ORDER — MORPHINE SULFATE (PF) 4 MG/ML IV SOLN
4.0000 mg | INTRAVENOUS | Status: DC | PRN
  Administered 2023-11-06: 4 mg via INTRAVENOUS
  Filled 2023-11-06: qty 1

## 2023-11-06 MED ORDER — KETOROLAC TROMETHAMINE 15 MG/ML IJ SOLN: 15.0000 mg | Freq: Four times a day (QID) | INTRAMUSCULAR | Status: DC | PRN

## 2023-11-06 MED ORDER — MORPHINE SULFATE (PF) 4 MG/ML IV SOLN: 4.0000 mg | INTRAVENOUS | Status: DC | PRN

## 2023-11-06 MED ORDER — NITROGLYCERIN 0.4 MG SL SUBL: 0.4000 mg | SUBLINGUAL_TABLET | SUBLINGUAL | Status: DC | PRN

## 2023-11-06 MED ORDER — ATORVASTATIN CALCIUM 20 MG PO TABS
80.0000 mg | ORAL_TABLET | Freq: Every day | ORAL | Status: DC
Start: 2023-11-07 — End: 2023-11-07

## 2023-11-06 MED ORDER — DIPHENHYDRAMINE HCL 50 MG/ML IJ SOLN
25.0000 mg | Freq: Once | INTRAMUSCULAR | Status: AC
  Administered 2023-11-06: 25 mg via INTRAVENOUS
  Filled 2023-11-06: qty 1

## 2023-11-06 MED ORDER — ESCITALOPRAM OXALATE 10 MG PO TABS
10.0000 mg | ORAL_TABLET | Freq: Every day | ORAL | Status: DC
Start: 2023-11-07 — End: 2023-11-07

## 2023-11-06 MED ORDER — EZETIMIBE 10 MG PO TABS: 10.0000 mg | ORAL_TABLET | Freq: Every day | ORAL | Status: DC

## 2023-11-06 MED ORDER — ONDANSETRON HCL 4 MG/2ML IJ SOLN
4.0000 mg | Freq: Four times a day (QID) | INTRAMUSCULAR | Status: DC | PRN
  Administered 2023-11-06: 4 mg via INTRAVENOUS
  Filled 2023-11-06: qty 2

## 2023-11-06 MED ORDER — PANTOPRAZOLE SODIUM 40 MG PO TBEC
40.0000 mg | DELAYED_RELEASE_TABLET | Freq: Every day | ORAL | Status: DC
Start: 2023-11-07 — End: 2023-11-07

## 2023-11-06 MED ORDER — ACETAMINOPHEN 650 MG RE SUPP: 650.0000 mg | Freq: Four times a day (QID) | RECTAL | Status: DC | PRN

## 2023-11-06 MED ORDER — KETOROLAC TROMETHAMINE 15 MG/ML IJ SOLN
15.0000 mg | Freq: Once | INTRAMUSCULAR | Status: AC
  Administered 2023-11-06: 15 mg via INTRAMUSCULAR
  Filled 2023-11-06: qty 1

## 2023-11-06 MED ORDER — INSULIN ASPART 100 UNIT/ML IJ SOLN
0.0000 [IU] | Freq: Three times a day (TID) | INTRAMUSCULAR | Status: DC
  Administered 2023-11-06: 11 [IU] via SUBCUTANEOUS
  Filled 2023-11-06: qty 1

## 2023-11-06 MED ORDER — PROCHLORPERAZINE MALEATE 10 MG PO TABS
10.0000 mg | ORAL_TABLET | Freq: Once | ORAL | Status: AC
  Administered 2023-11-06: 10 mg via ORAL
  Filled 2023-11-06: qty 1

## 2023-11-06 MED ORDER — SODIUM CHLORIDE 0.9 % IV BOLUS (SEPSIS)
1000.0000 mL | Freq: Once | INTRAVENOUS | Status: AC
Start: 2023-11-06 — End: 2023-11-06
  Administered 2023-11-06: 1000 mL via INTRAVENOUS

## 2023-11-06 MED ORDER — ONDANSETRON HCL 4 MG PO TABS: 4.0000 mg | ORAL_TABLET | Freq: Four times a day (QID) | ORAL | Status: DC | PRN

## 2023-11-06 MED ORDER — DILTIAZEM HCL ER COATED BEADS 120 MG PO CP24
120.0000 mg | ORAL_CAPSULE | Freq: Every day | ORAL | Status: DC
  Filled 2023-11-06: qty 1

## 2023-11-06 MED ORDER — NICOTINE 21 MG/24HR TD PT24: 21.0000 mg | MEDICATED_PATCH | Freq: Every day | TRANSDERMAL | Status: DC | PRN

## 2023-11-06 MED ORDER — INSULIN ASPART 100 UNIT/ML IJ SOLN: 0.0000 [IU] | Freq: Every day | INTRAMUSCULAR | Status: DC

## 2023-11-06 MED ORDER — IOHEXOL 300 MG/ML  SOLN
100.0000 mL | Freq: Once | INTRAMUSCULAR | Status: AC | PRN
  Administered 2023-11-06: 100 mL via INTRAVENOUS

## 2023-11-06 MED ORDER — HYDRALAZINE HCL 20 MG/ML IJ SOLN: 5.0000 mg | Freq: Four times a day (QID) | INTRAMUSCULAR | Status: DC | PRN

## 2023-11-06 MED ORDER — DROPERIDOL 2.5 MG/ML IJ SOLN
5.0000 mg | Freq: Once | INTRAMUSCULAR | Status: AC
Start: 2023-11-06 — End: 2023-11-06
  Administered 2023-11-06: 5 mg via INTRAVENOUS
  Filled 2023-11-06: qty 2

## 2023-11-06 MED ORDER — LIDOCAINE 5 % EX PTCH: 1.0000 | MEDICATED_PATCH | CUTANEOUS | Status: DC | PRN

## 2023-11-06 MED ORDER — LACTATED RINGERS IV SOLN: INTRAVENOUS | Status: DC

## 2023-11-06 MED ORDER — HALOPERIDOL LACTATE 5 MG/ML IJ SOLN: 5.0000 mg | Freq: Four times a day (QID) | INTRAMUSCULAR | Status: DC | PRN

## 2023-11-06 MED ORDER — HEPARIN SODIUM (PORCINE) 5000 UNIT/ML IJ SOLN: 5000.0000 [IU] | Freq: Three times a day (TID) | INTRAMUSCULAR | Status: DC

## 2023-11-06 MED ORDER — ACETAMINOPHEN 325 MG PO TABS
650.0000 mg | ORAL_TABLET | Freq: Four times a day (QID) | ORAL | Status: DC | PRN
Start: 2023-11-06 — End: 2023-11-11

## 2023-11-06 NOTE — H&P (Signed)
 History and Physical   Karen Ruiz FMW:[id number] DOB: August 21, 1981 DOA: 11/06/2023  PCP: Clora Marlee RODES, MD  Patient coming from: Home via EMS  I have personally briefly reviewed patient's old medical records in [facility] EMR.  Chief Concern: Intra, nausea, vomiting  HPI: Karen Ruiz is a 43 year old female with history of hyperlipidemia, hypertension, insulin -dependent diabetes mellitus, GERD, gastroparesis, history of THC use, who presents emergency department for chief concerns of intractable nausea, vomiting.  Vitals in the ED showed temperature of 98, respiration rate 20, heart rate 90, blood pressure 178/97, SpO2 of 96% on room air.  Serum sodium is 136, potassium 3.5, chloride 106, bicarb 20, BUN of 16, serum creatinine 0.57, EGFR greater than 60, nonfasting blood glucose 284, WBC 7.0, hemoglobin 11.7, platelets of 293.  HS troponin is 11 and on repeat is 11.  Pregnancy screening was negative.  UDS was positive for THC.  ED treatment: Sodium chloride  1 L bolus, Toradol  50 mg IM one-time dose, droperidol  5 mg IV one-time dose, Benadryl  25 mg IV one-time dose.  Compazine  10 mg p.o. one-time dose. -------------------------------- At bedside, patient able to tell me her first and last name, age, location, current calendar year.   She reports she developed these abdominal pain last night.  She denies trauma to her person.  She reports she is not able to tolerate any p.o. intake.  She reports it feels like gastroparesis flare.  She reports her stomach hurts first and then she starts to have nausea and vomiting.  She denies fever, cough, chest pain, shortness of breath, dysuria, hematuria, blood in her stool, diarrhea, swelling of her lower extremities, syncope, loss of consciousness.  Patient reports she last smoked marijuana 2 days ago.  Social history: she lives at home with her husband. She denies tobacco use. She endorses THC smoking.  ROS: Constitutional: no  weight change, no fever ENT/Mouth: no sore throat, no rhinorrhea Eyes: no eye pain, no vision changes Cardiovascular: no chest pain, no dyspnea,  no edema, no palpitations Respiratory: no cough, no sputum, no wheezing Gastrointestinal: + nausea, + vomiting, no diarrhea, no constipation Genitourinary: no urinary incontinence, no dysuria, no hematuria Musculoskeletal: no arthralgias, no myalgias Skin: no skin lesions, no pruritus Neuro: + weakness, no loss of consciousness, no syncope Psych: no anxiety, no depression, + decrease appetite Heme/Lymph: no bruising, no bleeding  ED Course: Discussed with EDP, patient requiring hospitalization for chief concerns of intractable nausea and vomiting.  Assessment/Plan  Principal Problem:   Intractable nausea and vomiting Active Problems:   Uncontrolled type 2 diabetes mellitus with hyperglycemia (HCC)   Gastroparesis   Dyslipidemia   Tobacco abuse   Essential hypertension   Mixed hyperlipidemia   Polysubstance abuse (HCC)   Coronary artery disease involving native coronary artery of native heart   Marijuana abuse   Morbid obesity (HCC)   Assessment and Plan:  * Intractable nausea and vomiting Etiology workup in progress CT abdomen pelvis with contrast ordered on admission Differentials include gastroparesis complicated by [facility] use causing cyclic vomiting syndrome however given patient's profound endorsement of abdominal pain, will order CT abdomen pelvis without contrast to rule out intra-abdominal abnormalities/infectious etiology Status post sodium chloride  1 L bolus Continue with LR infusion at 125 mL/h Patient is status post droperidol  and this caused her to fall asleep, she woke up when she was told she needed to go home Haloperidol  5 mg IV every 6 hours as needed for intractable nausea and vomiting, 2  days ordered  Uncontrolled type 2 diabetes mellitus with hyperglycemia (HCC) Insulin  SSI with at bedtime coverage ordered Goal  inpatient blood glucose levels 140-180  Morbid obesity (HCC) This meets criteria for morbid obesity based on the presence of 1 or more chronic comorbidities. Patient has 38.74. This complicates overall care and prognosis.   Marijuana abuse Counseled patient on cessation at this may be the cause of patient's intractable nausea and vomiting Patient endorses understanding however does not endorse compliance  Tobacco abuse As needed nicotine  patch ordered for nicotine  craving  Chart reviewed.   DVT prophylaxis: heparin  5000 q8h Code Status: full code Diet: npo; advance to clear liquid diet as tolerated Family Communication: A phone call was offered, patient declined Disposition Plan: Pending clinical course Consults called: None at this time Admission status: Telemetry medical, inpatient  Past Medical History:  Diagnosis Date   Acute ST elevation myocardial infarction (STEMI) involving other coronary artery of inferior wall (HCC) 07/21/2023   Barrett esophagus    Diabetes mellitus without complication (HCC)    Gastroparesis    Migraines    MRSA (methicillin resistant staph aureus) culture positive    2009   Obesity    ST elevation myocardial infarction (STEMI) (HCC) 05/02/2021   Type 2 diabetes mellitus (HCC) 09/06/2016   Last Assessment & Plan:    Ordered new glucometer and test strips  Last Assessment & Plan:    Ordered new glucometer and test strips     Past Surgical History:  Procedure Laterality Date   CARDIAC CATHETERIZATION     CESAREAN SECTION     CORONARY STENT INTERVENTION N/A 05/02/2021   Procedure: CORONARY STENT INTERVENTION;  Surgeon: Mady Bruckner, MD;  Location: [facility] INVASIVE CV LAB;  Service: Cardiovascular;  Laterality: N/A;   CORONARY ULTRASOUND/IVUS N/A 05/02/2021   Procedure: Intravascular Ultrasound/IVUS;  Surgeon: Mady Bruckner, MD;  Location: [facility] INV[facility] CV [facility];  Service: Cardiovascular;  Laterality: N/A;   CORONARY ULTRASOUND/IVUS N/A  07/23/2023   Procedure: Coronary Ultrasound/IVUS;  Surgeon: Darron Deatrice LABOR, MD;  Location: [facility];  Service: Cardiovascular;  Laterality: N/A;   CORONARY/GRAFT ACUTE MI REVASCULARIZATION N/A 07/20/2023   Procedure: Coronary/Graft Acute MI Revascularization;  Surgeon: Darron Deatrice LABOR, MD;  Location: [facility];  Service: Cardiovascular;  Laterality: N/A;   CORONARY/GRAFT ACUTE MI REVASCULARIZATION N/A 07/23/2023   Procedure: Coronary/Graft Acute MI Revascularization;  Surgeon: Darron Deatrice LABOR, MD;  Location: [facility];  Service: Cardiovascular;  Laterality: N/A;   ESOPHAGOGASTRODUODENOSCOPY N/A 07/28/2019   Procedure: ESOPHAGOGASTRODUODENOSCOPY (EGD);  Surgeon: Unk Corinn Skiff, MD;  Location[facility] END[facility]COPY;  Service: Gastroenterology;  Laterality: N/A;   LEFT HEART CATH AND CORONARY ANGIOGRAPHY N/A 05/02/2021   Procedure: LEFT HEART CATH AND CORONARY ANGIOGRAPHY;  Surgeon: Mady Bruckner, MD;  Location: [facility];  Service: Cardiovascular;  Laterality: N/A;   LEFT HEART CATH AND CORONARY ANGIOGRAPHY N/A 07/20/2023   Procedure: LEFT HEART CATH AND CORONARY ANGIOGRAPHY;  Surgeon: Darron Deatrice LABOR, MD;  Location: [facility];  Service: Cardiovascular;  Laterality: N/A;   LEFT HEART CATH AND CORONARY ANGIOGRAPHY N/A 07/23/2023   Procedure: LEFT HEART CATH AND CORONARY ANGIOGRAPHY;  Surgeon: Darron Deatrice LABOR, MD;  Location: [facility] CV [facility];  Service: Cardiovascular;  Laterality: N/A;   Social History:  reports that she has been smoking cigarettes. She has never used smokeless tobacco. She reports current alcohol use. She reports current drug use. Drug: Marijuana.  Allergies  Allergen Reactions   Quinolones  Other (See Comments)   Family History  Problem Relation Age of Onset   Diabetes Mother    Cancer Mother    Heart failure Mother    Hypertension Mother    Heart attack Father    Diabetes Father    Hypertension Father     Family history: Family history reviewed and not pertinent.  Prior to Admission medications   Medication Sig Start Date End Date Taking? Authorizing Provider  aspirin  EC 81 MG EC tablet Take 1 tablet (81 mg total) by mouth daily. Swallow whole. 05/04/21   Maree Hue, MD  atorvastatin  (LIPITOR ) 80 MG tablet Take 1 tablet (80 mg total) by mouth daily. 07/22/23 10/20/23  Amin, Sumayya, MD  carvedilol (COREG) 6.25 MG tablet Take by mouth. 03/12/23   [provider]  diltiazem  (CARDIZEM  CD) 120 MG 24 hr capsule Take 1 capsule (120 mg total) by mouth daily. 08/01/23 09/30/23  Jhonny Calvin NOVAK, MD  escitalopram  (LEXAPRO ) 10 MG tablet Take 1 tablet (10 mg total) by mouth daily. 07/22/23 10/20/23  Amin, Sumayya, MD  Evolocumab  (REPATHA  SURECLICK) 140 MG/ML SOAJ Inject 140 mg into the skin every 14 (fourteen) days. 07/31/23   Gollan, Timothy J, MD  ezetimibe  (ZETIA ) 10 MG tablet Take by mouth. 04/02/23   [provider]  furosemide (LASIX) 40 MG tablet Take by mouth. 04/01/23 03/31/24  [provider]  insulin  aspart (NOVOLOG ) 100 UNIT/ML injection Inject 0-9 Units into the skin 3 (three) times daily with meals. 07/16/19   Konidena, Snehalatha, MD  JANUVIA 100 MG tablet Take 1 tablet by mouth daily. 03/12/23   [provider]  LANTUS  SOLOSTAR 100 UNIT/ML Solostar Pen Inject into the skin. 03/20/23   [provider]  losartan  (COZAAR ) 50 MG tablet Take 1 tablet by mouth daily. 05/10/23   [provider]  pantoprazole  (PROTONIX ) 40 MG tablet Take 1 tablet (40 mg total) by mouth daily. PLEASE SCHEDULE OFFICE VISIT FOR FURTHER REFILLS. THANK YOU! 07/22/23 10/20/23  Caleen Qualia, MD  promethazine  (PHENERGAN ) 25 MG suppository Place 1 suppository (25 mg total) rectally every 8 (eight) hours as needed for refractory nausea / vomiting. 09/26/23   Bradler, Evan K, MD  promethazine  (PHENERGAN ) 25 MG tablet Take 1 tablet (25 mg total) by mouth every 6 (six) hours as needed  for nausea or vomiting. 09/26/23   Jossie Artist POUR, MD   Physical Exam: Vitals:   11/06/23 0507 11/06/23 0510 11/06/23 0908 11/06/23 1404  BP:  (!) 178/97    Pulse: 98     Resp: 20     Temp: 98 F (36.7 C)  98 F (36.7 C) 98.6 F (37 C)  TempSrc:   Oral Oral  SpO2: 96%     Weight:      Height:       Constitutional: appears age appropriate, NAD, calm Eyes: PERRL, lids and conjunctivae normal ENMT: Mucous membranes are dry. Posterior pharynx clear of any exudate or lesions. Age-appropriate dentition. Hearing appropriate Neck: normal, supple, no masses, no thyromegaly Respiratory: clear to auscultation bilaterally, no wheezing, no crackles. Normal respiratory effort. No accessory muscle use.  Cardiovascular: Regular rate and rhythm, no murmurs / rubs / gallops. No extremity edema. 2+ pedal pulses. No carotid bruits.  Abdomen: Morbidly obese abdomen, + diffuse tenderness, no masses palpated, no hepatosplenomegaly. Bowel sounds positive.  Musculoskeletal: no clubbing / cyanosis. No joint deformity upper and lower extremities. Good ROM, no contractures, no atrophy. Normal muscle tone.  Skin: no rashes, lesions, ulcers. No  induration Neurologic: Sensation intact. Strength 5/5 in all 4.  Psychiatric: Normal judgment and insight. Alert and oriented x 3. Depressed and tearful mood.   EKG: independently reviewed, showing sinus tachycardia with rate of 103, QTc 471  Chest x-ray on Admission: I personally reviewed and I agree with radiologist reading as below.  DG Chest Portable 1 View Result Date: 11/06/2023 CLINICAL DATA:  Chest pain and shortness of breath. EXAM: PORTABLE CHEST 1 VIEW COMPARISON:  Portable chest 09/26/2023. FINDINGS: The lungs expiratory but generally clear although with limited view of the bases. The cardiomediastinal silhouette and vasculature are normal for low lung volumes. The sulci are sharp. The thoracic cage grossly intact. IMPRESSION: Expiratory chest without obvious  evidence of acute chest disease. Limited view of the bases. Electronically Signed   By: Francis Quam M.D.   On: 11/06/2023 07:53   Labs on Admission: I have personally reviewed following labs  CBC: Recent Labs  Lab 11/06/23 0607  WBC 7.0  HGB 11.7*  HCT 38.1  MCV 72.2*  PLT 293   Basic Metabolic Panel: Recent Labs  Lab 11/06/23 0607  NA 136  K 3.5  CL 106  CO2 20*  GLUCOSE 284*  BUN 16  CREATININE 0.57  CALCIUM  8.2*   GFR: Estimated Creatinine Clearance: 112.1 mL/min (by C-G formula based on SCr of 0.57 mg/dL).  Liver Function Tests: Recent Labs  Lab 11/06/23 0607  AST 21  ALT 25  ALKPHOS 76  BILITOT <0.2  PROT 7.3  ALBUMIN 3.6   Recent Labs  Lab 11/06/23 0607  LIPASE 23   Urine analysis:    Component Value Date/Time   COLORURINE YELLOW (A) 11/06/2023 0530   APPEARANCEUR HAZY (A) 11/06/2023 0530   LABSPEC 1.033 (H) 11/06/2023 0530   PHURINE 5.0 11/06/2023 0530   GLUCOSEU 150 (A) 11/06/2023 0530   HGBUR NEGATIVE 11/06/2023 0530   BILIRUBINUR NEGATIVE 11/06/2023 0530   KETONESUR NEGATIVE 11/06/2023 0530   PROTEINUR 30 (A) 11/06/2023 0530   NITRITE NEGATIVE 11/06/2023 0530   LEUKOCYTESUR MODERATE (A) 11/06/2023 0530   This document was prepared using Dragon Voice Recognition software and may include unintentional dictation errors.  Dr. Sherre [facility]  If 7PM-7AM, please contact overnight-coverage provider If 7AM-7PM, please contact day attending provider [email or web address]  11/06/2023, 3:07 PM

## 2023-11-06 NOTE — ED Provider Notes (Signed)
 [facility] Provider Note    Event Date/Time   First MD Initiated Contact with Patient 11/06/23 [phone]     (approximate)   History   Abdominal Pain   HPI  Karen Ruiz is a 43 y.o. female with history of diabetes, gastroparesis, CAD who presents to the emergency department with complaints of diffuse abdominal pain, dry heaving, chest pain, shortness of breath that started last night around 8 PM.  States she feels like this feels similar to her previous gastroparesis flares.  She has had prior C-section.  Denies other abdominal surgery.  She does report she smokes marijuana occasionally.  It is very difficult to obtain history from patient as she is moaning in pain, dry heaving, spitting. History provided by patient.    Past Medical History:  Diagnosis Date   Acute ST elevation myocardial infarction (STEMI) involving other coronary artery of inferior wall (HCC) 07/21/2023   Barrett esophagus    Diabetes mellitus without complication (HCC)    Gastroparesis    Migraines    MRSA (methicillin resistant staph aureus) culture positive    2009   Obesity    ST elevation myocardial infarction (STEMI) (HCC) 05/02/2021   Type 2 diabetes mellitus (HCC) 09/06/2016   Last Assessment & Plan:    Ordered new glucometer and test strips  Last Assessment & Plan:    Ordered new glucometer and test strips      Past Surgical History:  Procedure Laterality Date   CARDIAC CATHETERIZATION     CESAREAN SECTION     CORONARY STENT INTERVENTION N/A 05/02/2021   Procedure: CORONARY STENT INTERVENTION;  Surgeon: Mady Bruckner, MD;  Location: [facility];  Service: Cardiovascular;  Laterality: N/A;   CORONARY ULTRASOUND/IVUS N/A 05/02/2021   Procedure: Intravascular Ultrasound/IVUS;  Surgeon: Mady Bruckner, MD;  Location: [facility];  Service: Cardiovascular;  Laterality: N/A;   CORONARY ULTRASOUND/IVUS N/A 07/23/2023   Procedure: Coronary  Ultrasound/IVUS;  Surgeon: Darron Deatrice LABOR, MD;  Location: [facility];  Service: Cardiovascular;  Laterality: N/A;   CORONARY/GRAFT ACUTE MI REVASCULARIZATION N/A 07/20/2023   Procedure: Coronary/Graft Acute MI Revascularization;  Surgeon: Darron Deatrice LABOR, MD;  Location: [facility];  Service: Cardiovascular;  Laterality: N/A;   CORONARY/GRAFT ACUTE MI REVASCULARIZATION N/A 07/23/2023   Procedure: Coronary/Graft Acute MI Revascularization;  Surgeon: Darron Deatrice LABOR, MD;  Location: [facility];  Service: Cardiovascular;  Laterality: N/A;   ESOPHAGOGASTRODUODENOSCOPY N/A 07/28/2019   Procedure: ESOPHAGOGASTRODUODENOSCOPY (EGD);  Surgeon: Unk Corinn Skiff, MD;  Location: [facility] ENDOSCOPY;  Service: Gastroenterology;  Laterality: N/A;   LEFT HEART CATH AND CORONARY ANGIOGRAPHY N/A 05/02/2021   Procedure: LEFT HEART CATH AND CORONARY ANGIOGRAPHY;  Surgeon: Mady Bruckner, MD;  Location: [facility];  Service: Cardiovascular;  Laterality: N/A;   LEFT HEART CATH AND CORONARY ANGIOGRAPHY N/A 07/20/2023   Procedure: LEFT HEART CATH AND CORONARY ANGIOGRAPHY;  Surgeon: Darron Deatrice LABOR, MD;  Location: [facility];  Service: Cardiovascular;  Laterality: N/A;   LEFT HEART CATH AND CORONARY ANGIOGRAPHY N/A 07/23/2023   Procedure: LEFT HEART CATH AND CORONARY ANGIOGRAPHY;  Surgeon: Darron Deatrice LABOR, MD;  Location: [facility];  Service: Cardiovascular;  Laterality: N/A;    MEDICATIONS:  Prior to Admission medications   Medication Sig Start Date End Date Taking? Authorizing Provider  aspirin  EC 81 MG EC tablet Take 1 tablet (81 mg total) by mouth daily. Swallow whole. 05/04/21   Maree Hue,  MD  atorvastatin  (LIPITOR ) 80 MG tablet Take 1 tablet (80 mg total) by mouth daily. 07/22/23 10/20/23  Amin, Sumayya, MD  carvedilol (COREG) 6.25 MG tablet Take by mouth. 03/12/23   [provider]  diltiazem  (CARDIZEM  CD) 120 MG 24 hr capsule Take 1  capsule (120 mg total) by mouth daily. 08/01/23 09/30/23  Jhonny Calvin NOVAK, MD  escitalopram  (LEXAPRO ) 10 MG tablet Take 1 tablet (10 mg total) by mouth daily. 07/22/23 10/20/23  Amin, Sumayya, MD  Evolocumab  (REPATHA  SURECLICK) 140 MG/ML SOAJ Inject 140 mg into the skin every 14 (fourteen) days. 07/31/23   Gollan, Timothy J, MD  ezetimibe  (ZETIA ) 10 MG tablet Take by mouth. 04/02/23   [provider]  furosemide (LASIX) 40 MG tablet Take by mouth. 04/01/23 03/31/24  [provider]  insulin  aspart (NOVOLOG ) 100 UNIT/ML injection Inject 0-9 Units into the skin 3 (three) times daily with meals. 07/16/19   Konidena, Snehalatha, MD  JANUVIA 100 MG tablet Take 1 tablet by mouth daily. 03/12/23   [provider]  LANTUS  SOLOSTAR 100 UNIT/ML Solostar Pen Inject into the skin. 03/20/23   [provider]  losartan  (COZAAR ) 50 MG tablet Take 1 tablet by mouth daily. 05/10/23   [provider]  pantoprazole  (PROTONIX ) 40 MG tablet Take 1 tablet (40 mg total) by mouth daily. PLEASE SCHEDULE OFFICE VISIT FOR FURTHER REFILLS. THANK YOU! 07/22/23 10/20/23  Caleen Qualia, MD  promethazine  (PHENERGAN ) 25 MG suppository Place 1 suppository (25 mg total) rectally every 8 (eight) hours as needed for refractory nausea / vomiting. 09/26/23   Bradler, Evan K, MD  promethazine  (PHENERGAN ) 25 MG tablet Take 1 tablet (25 mg total) by mouth every 6 (six) hours as needed for nausea or vomiting. 09/26/23   Jossie Artist POUR, MD    Physical Exam   Triage Vital Signs: ED Triage Vitals  Encounter Vitals Group     BP 11/06/23 0510 (!) 178/97     Systolic BP Percentile --      Diastolic BP Percentile --      Pulse Rate 11/06/23 0507 98     Resp 11/06/23 0507 20     Temp 11/06/23 0507 98 F (36.7 C)     Temp src --      SpO2 11/06/23 0507 96 %     Weight 11/06/23 0506 240 lb (108.9 kg)     Height 11/06/23 0506 5' 6 (1.676 m)     Head Circumference --      Peak Flow --      Pain Score  11/06/23 0506 10     Pain Loc --      Pain Education --      Exclude from Growth Chart --     Most recent vital signs: Vitals:   11/06/23 0507 11/06/23 0510  BP:  (!) 178/97  Pulse: 98   Resp: 20   Temp: 98 F (36.7 C)   SpO2: 96%     CONSTITUTIONAL: Alert, responds appropriately to questions.  Obese, appears uncomfortable, dry heaving, moaning, spitting into an emesis bag HEAD: Normocephalic, atraumatic EYES: Conjunctivae clear, pupils appear equal, sclera nonicteric ENT: normal nose; moist mucous membranes NECK: Supple, normal ROM CARD: Regular and tachycardic; S1 and S2 appreciated RESP: Normal chest excursion without splinting or tachypnea; breath sounds clear and equal bilaterally; no wheezes, no rhonchi, no rales, no hypoxia or respiratory distress, speaking full sentences ABD/GI: Non-distended; soft, diffusely tender to palpation without guarding, rebound.  No peritoneal signs.  Nonsurgical abdomen. BACK: The back appears normal EXT: Normal ROM in all joints; no deformity noted, no edema SKIN: Normal color for age and race; warm; no rash on exposed skin NEURO: Moves all extremities equally, normal speech PSYCH: The patient's mood and manner are appropriate.   ED Results / Procedures / Treatments   LABS: (all labs ordered are listed, but only abnormal results are displayed) Labs Reviewed  COMPREHENSIVE METABOLIC PANEL - Abnormal; Notable for the following components:      Result Value   CO2 20 (*)    Glucose, Bld 284 (*)    Calcium  8.2 (*)    All other components within normal limits  CBC - Abnormal; Notable for the following components:   RBC 5.28 (*)    Hemoglobin 11.7 (*)    MCV 72.2 (*)    MCH 22.2 (*)    RDW 17.2 (*)    All other components within normal limits  URINALYSIS, ROUTINE W REFLEX MICROSCOPIC - Abnormal; Notable for the following components:   Color, Urine YELLOW (*)    APPearance HAZY (*)    Specific Gravity, Urine 1.033 (*)    Glucose, UA  150 (*)    Protein, ur 30 (*)    Leukocytes,Ua MODERATE (*)    All other components within normal limits  URINE DRUG SCREEN, QUALITATIVE ([facility] ONLY) - Abnormal; Notable for the following components:   Cannabinoid 50 Ng, Ur Wilkes POSITIVE (*)    All other components within normal limits  LIPASE, BLOOD  PREGNANCY, URINE  TROPONIN I (HIGH SENSITIVITY)  TROPONIN I (HIGH SENSITIVITY)     EKG:    Date: 11/06/2023 5:39 AM  Rate: 103  Rhythm: Sinus tachycardia  QRS Axis: Rightward axis  Intervals: normal  ST/T Wave abnormalities: normal  Conduction Disutrbances: none  Narrative Interpretation: Sinus tachycardia, rightward axis, no ischemia, QTc 471 ms     RADIOLOGY: My personal review and interpretation of imaging: Chest x-ray clear.  I have personally reviewed all radiology reports.   No results found.   PROCEDURES:  Critical Care performed: No     .1-3 Lead EKG Interpretation  Performed by: Camerin Ladouceur, Josette SAILOR, DO Authorized by: Odus Clasby, Josette SAILOR, DO     Interpretation: normal     ECG rate:  98   ECG rate assessment: normal     Rhythm: sinus rhythm     Ectopy: none     Conduction: normal       IMPRESSION / MDM / ASSESSMENT AND PLAN / ED COURSE  I reviewed the triage vital signs and the nursing notes.    Patient here with chest pain, shortness of breath, abdominal pain that she states feels similar to her prior gastroparesis flares.  The patient is on the cardiac monitor to evaluate for evidence of arrhythmia and/or significant heart rate changes.   DIFFERENTIAL DIAGNOSIS (includes but not limited to):   Gastroparesis, cannabinoid hyperemesis syndrome, gastritis, ACS, doubt appendicitis, colitis, bowel obstruction, perforation, PE   Patient's presentation is most consistent with acute presentation with potential threat to life or bodily function.   PLAN: Will obtain abdominal labs, cardiac labs, chest x-ray.  EKG shows no ischemic abnormality.  Patient given 5  mg of IV droperidol  and is now sleeping and no longer moaning, spitting, dry heaving.  Abdominal exam initially benign on my exam with nonsurgical abdomen.  She reports this is similar to her chronic episodes.  I do not feel she needs a CT of her abdomen pelvis at  this time unless repeat exam changes or abnormal workup in the ED.   MEDICATIONS GIVEN IN ED: Medications  sodium chloride  0.9 % bolus 1,000 mL (0 mLs Intravenous Stopped 11/06/23 0637)  droperidol  (INAPSINE ) 2.5 MG/ML injection 5 mg (5 mg Intravenous Given 11/06/23 0552)     ED COURSE: Patient's labs show stable mild anemia.  Normal LFTs, lipase.  Glucose of 284 with bicarb of 20 but normal anion gap and no ketonuria.  Doubt DKA.  First troponin negative.  Second pending.  Chest x-ray reviewed and interpreted by myself and the radiologist and is unremarkable.  Drug screen positive for cannabinoids.  We did discuss that hyperemesis cannabinoid syndrome could be contributing to symptoms today.  Patient no longer moaning in pain or dry heaving.  Will p.o. challenge.  Signed out the oncoming ED physician to follow-up on second troponin.   CONSULTS: Pending further workup   OUTSIDE RECORDS REVIEWED: Reviewed last admissions to the hospital for NSTEMI, STEMI.       FINAL CLINICAL IMPRESSION(S) / ED DIAGNOSES   Final diagnoses:  Gastroparesis  Chest pain, unspecified type     Rx / DC Orders   ED Discharge Orders     None        Note:  This document was prepared using Dragon voice recognition software and may include unintentional dictation errors.   Grisela Mesch, Josette SAILOR, DO 11/06/23 [phone]

## 2023-11-06 NOTE — ED Notes (Signed)
Pt is unhappy with care due to having to be in a hall. She wants to [facility] and understands risks. She has someone with her that will provide transport. Removing IV now. Pt left department.

## 2023-11-06 NOTE — Assessment & Plan Note (Signed)
 As needed nicotine patch ordered for nicotine craving

## 2023-11-06 NOTE — ED Triage Notes (Signed)
 Pt to ED via EMS from home, pt states she has hx gastroparesis, pt developed n/v and abd pain earlier tonight.

## 2023-11-06 NOTE — Progress Notes (Signed)
    Against Medical Advice   Karen Ruiz expresses desire to leave the Hospital immediately to her care nurse. Patient has been warned that this is not medically advisable at this time, and can result in medical complications like Death and Disability. Patient understands and accepts the risks involved and assumes full responsibilty of this decision.  This patient has also been advised that if they feel the need for further medical assistance to return to any available ER or dial [phone].  Informed by Nursing staff that this patient has left care and has signed the form  Against Medical Advice on 11/06/2023 at 1938.

## 2023-11-06 NOTE — Assessment & Plan Note (Addendum)
 Etiology workup in progress CT abdomen pelvis with contrast ordered on admission Differentials include gastroparesis complicated by [facility] use causing cyclic vomiting syndrome however given patient's profound endorsement of abdominal pain, will order CT abdomen pelvis without contrast to rule out intra-abdominal abnormalities/infectious etiology Status post sodium chloride  1 L bolus Continue with LR infusion at 125 mL/h Patient is status post droperidol  and this caused her to fall asleep, she woke up when she was told she needed to go home Haloperidol  5 mg IV every 6 hours as needed for intractable nausea and vomiting, 2 days ordered

## 2023-11-06 NOTE — Assessment & Plan Note (Signed)
This meets criteria for morbid obesity based on the presence of 1 or more chronic comorbidities. Patient has 38.74. This complicates overall care and prognosis.

## 2023-11-06 NOTE — ED Notes (Signed)
 Pt vomited within minutes.

## 2023-11-06 NOTE — Assessment & Plan Note (Addendum)
 -  Insulin SSI with at bedtime coverage ordered ?- Goal inpatient blood glucose levels 140-180 ?

## 2023-11-06 NOTE — ED Provider Notes (Addendum)
.-----------------------------------------   7:14 AM on 11/06/2023 -----------------------------------------  Blood pressure (!) 178/97, pulse 98, temperature 98 F (36.7 C), resp. rate 20, height 5' 6 (1.676 m), weight 108.9 kg, last menstrual period 10/07/2023, SpO2 96%.  Assuming care from Dr. Neomi.  In short, Karen Ruiz is a 43 y.o. female with a chief complaint of Abdominal Pain .  Refer to the original H&P for additional details.  The current plan of care is to follow up labs, reassess.  Independent review of labs and imaging, on my independent interpretation, chest x-ray without focal consolidation, Trope x 2 is negative, electrolytes not severely deranged, UA not consistent with UTI, pregnancy test is negative.  LFTs are not elevated, lipase is not elevated, drug screen is positive for cannabinoids.  On reassessment patient tolerated p.o., is ambulatory.  She was all set for discharge when she started complaining again of symptoms recurring.  States that she would like to try some p.o. medications.  Will give her p.o. Compazine  and a shot of IM Toradol .  Shared decision making done with patient and husband, will also place a referral for new primary care doctor that she can follow-up with.  Did look up and the [facility] does have a functional GI and GI motility clinic, will give her the number to call to set up a follow-up appointment.  Was planning on discharge but patient was unable to tolerate the p.o. meds.  Will plan to have her admitted for further management of her symptoms.  Consulted hospitalist who is agreeable with plan for admission will evaluate the patient.  She is admitted.  Clinical Course as of 11/06/23 0846  Wed Nov 06, 2023  0702 Signed out pending 2nd trop, follow up labs. Gastroparesis history, given droperidol  with sxs improvement. [TT]  [id number] Patient tolerated p.o. [TT]  9193 DG Chest Portable 1 View IMPRESSION: Expiratory chest without obvious evidence of  acute chest disease. Limited view of the bases.   [TT]  [id number] On reassessment patient states she feels a little bit jittery after the droperidol , will give her dose of IV Benadryl .  Has reglan  at home. Discussed referring her to a new primary care doctor since she was requesting for it.  Also discussed laboratory imaging workup done so far. [TT]    Clinical Course User Index [TT] Waymond Lorelle Cummins, MD      Waymond Lorelle Cummins, MD 11/06/23 1031    Waymond Lorelle Cummins, MD 11/06/23 [phone]

## 2023-11-06 NOTE — Hospital Course (Signed)
 Ms. Karen Ruiz is a 43 year old female with history of hyperlipidemia, hypertension, insulin -dependent diabetes mellitus, GERD, gastroparesis, history of THC use, who presents emergency department for chief concerns of intractable nausea, vomiting.  Vitals in the ED showed temperature of 98, respiration rate 20, heart rate 90, blood pressure 178/97, SpO2 of 96% on room air.  Serum sodium is 136, potassium 3.5, chloride 106, bicarb 20, BUN of 16, serum creatinine 0.57, EGFR greater than 60, nonfasting blood glucose 284, WBC 7.0, hemoglobin 11.7, platelets of 293.  HS troponin is 11 and on repeat is 11.  Pregnancy screening was negative.  UDS was positive for THC.  ED treatment: Sodium chloride  1 L bolus, Toradol  50 mg IM one-time dose, droperidol  5 mg IV one-time dose, Benadryl  25 mg IV one-time dose.  Compazine  10 mg p.o. one-time dose.

## 2023-11-06 NOTE — Assessment & Plan Note (Signed)
 Counseled patient on cessation at this may be the cause of patient's intractable nausea and vomiting Patient endorses understanding however does not endorse compliance

## 2023-11-06 NOTE — Discharge Instructions (Signed)
 Please avoid taking marijuana since this might contribute to your gastroparesis as well as nausea vomiting and abdominal pain.  Please take your medications as prescribed.  Please return if you have persistent or worsening symptoms, fever, dehydration, lightheadedness, chest pain, shortness of breath, or if you have any additional concerns.

## 2023-11-30 ENCOUNTER — Other Ambulatory Visit: Payer: Self-pay

## 2023-11-30 ENCOUNTER — Emergency Department
Admission: EM | Admit: 2023-11-30 | Discharge: 2023-11-30 | Disposition: A | Discharge status: Home / Self Care | Attending: Emergency Medicine | Admitting: Emergency Medicine

## 2023-11-30 DIAGNOSIS — R109 Unspecified abdominal pain: Secondary | ICD-10-CM | POA: Insufficient documentation

## 2023-11-30 DIAGNOSIS — E1143 Type 2 diabetes mellitus with diabetic autonomic (poly)neuropathy: Secondary | ICD-10-CM | POA: Diagnosis not present

## 2023-11-30 LAB — CBC
HCT: 37.9 % (ref 36.0–46.0)
Hemoglobin: 11.9 g/dL — ABNORMAL LOW (ref 12.0–15.0)
MCH: 22.6 pg — ABNORMAL LOW (ref 26.0–34.0)
MCHC: 31.4 g/dL (ref 30.0–36.0)
MCV: 71.9 fL — ABNORMAL LOW (ref 80.0–100.0)
Platelets: 371 10*3/uL (ref 150–400)
RBC: 5.27 MIL/uL — ABNORMAL HIGH (ref 3.87–5.11)
RDW: 18.6 % — ABNORMAL HIGH (ref 11.5–15.5)
WBC: 10.4 10*3/uL (ref 4.0–10.5)
nRBC: 0 % (ref 0.0–0.2)

## 2023-11-30 LAB — COMPREHENSIVE METABOLIC PANEL
ALT: 35 U/L (ref 0–44)
AST: 26 U/L (ref 15–41)
Albumin: 3.8 g/dL (ref 3.5–5.0)
Alkaline Phosphatase: 80 U/L (ref 38–126)
Anion gap: 10 (ref 5–15)
BUN: 14 mg/dL (ref 6–20)
CO2: 20 mmol/L — ABNORMAL LOW (ref 22–32)
Calcium: 9.1 mg/dL (ref 8.9–10.3)
Chloride: 104 mmol/L (ref 98–111)
Creatinine, Ser: 0.48 mg/dL (ref 0.44–1.00)
GFR, Estimated: >60 mL/min (ref >60)
Glucose, Bld: 303 mg/dL — ABNORMAL HIGH (ref 70–99)
Potassium: 4 mmol/L (ref 3.5–5.1)
Sodium: 134 mmol/L — ABNORMAL LOW (ref 135–145)
Total Bilirubin: 0.6 mg/dL (ref 0.0–1.2)
Total Protein: 7.3 g/dL (ref 6.5–8.1)

## 2023-11-30 LAB — LIPASE, BLOOD: Lipase: 23 U/L (ref 11–51)

## 2023-11-30 LAB — HCG, QUANTITATIVE, PREGNANCY: hCG, Beta Chain, Quant, S: 2 m[IU]/mL (ref <5)

## 2023-11-30 MED ORDER — PROMETHAZINE HCL 25 MG/ML IJ SOLN
25.0000 mg | Freq: Once | INTRAMUSCULAR | Status: DC
  Filled 2023-11-30: qty 1

## 2023-11-30 MED ORDER — MORPHINE SULFATE (PF) 4 MG/ML IV SOLN
4.0000 mg | Freq: Once | INTRAVENOUS | Status: AC
  Administered 2023-11-30: 4 mg via INTRAVENOUS
  Filled 2023-11-30: qty 1

## 2023-11-30 MED ORDER — HYDROCODONE-ACETAMINOPHEN 5-325 MG PO TABS: 1.0000 | ORAL_TABLET | ORAL | 0 refills | Status: DC | PRN

## 2023-11-30 MED ORDER — SODIUM CHLORIDE 0.9 % IV BOLUS
500.0000 mL | Freq: Once | INTRAVENOUS | Status: AC
  Administered 2023-11-30: 500 mL via INTRAVENOUS

## 2023-11-30 NOTE — ED Triage Notes (Signed)
 Pt c/o of abd pain; states it started this morning. Has hx of gastroparesis.

## 2023-11-30 NOTE — ED Provider Notes (Signed)
 [facility] Provider Note    Event Date/Time   First MD Initiated Contact with Patient 11/30/23 1623     (approximate)   History   Abdominal Pain   HPI  Karen Ruiz is a 43 y.o. female history of hyperlipidemia hypertensive insulin-dependent diabetes gastroparesis   She woke up this morning with nausea and mid abdominal pain.  She reports "gastroparesis".  She reports a history of the same multiple times in the past.  No chest pain no difficulty breathing.  No vomiting.  Reports that this will happen frequently she was here about a month ago.  She went home and recovered.  Symptoms started to slowly increase last night.  She reports she has had the same symptoms multiple times previously.  Reports it comes in waves of severe pain and spasm in her abdomen  Physical Exam   Triage Vital Signs: ED Triage Vitals  Encounter Vitals Group     BP 11/30/23 1438 (!) 123/96     Systolic BP Percentile --      Diastolic BP Percentile --      Pulse Rate 11/30/23 1438 (!) 107     Resp 11/30/23 1438 20     Temp 11/30/23 1438 98.3 F (36.8 C)     Temp Source 11/30/23 1438 Oral     SpO2 11/30/23 1438 100 %     Weight 11/30/23 1435 239 lb 13.8 oz (108.8 kg)     Height 11/30/23 1435 5\' 6"  (1.676 m)     Head Circumference --      Peak Flow --      Pain Score 11/30/23 1435 10     Pain Loc --      Pain Education --      Exclude from Growth Chart --     Most recent vital signs: Vitals:   11/30/23 1438 11/30/23 1957  BP: (!) 123/96 136/69  Pulse: (!) 107 (!) 104  Resp: 20 17  Temp: 98.3 F (36.8 C) 98.7 F (37.1 C)  SpO2: 100% 97%     General: Awake, no distress, she has moaning reporting pain in her mid abdomen.  She is able to communicate in full clear sentences though.  She has no active emesis.  No emesis or emesis noted in the room CV:  Good peripheral perfusion.  Normal tones and rate with exception to very slight tachycardia Resp:  Normal  effort.  Clear bilateral Abd:  No distention.  Soft throughout not distended.  Bowel sounds present.  No focal abdominal discomfort, but she does report pain to palpation in the mid epigastric left upper quadrant region.  No rebound or guarding other:  Warm well-perfused extremities   ED Results / Procedures / Treatments   Labs (all labs ordered are listed, but only abnormal results are displayed) Labs Reviewed  COMPREHENSIVE METABOLIC PANEL - Abnormal; Notable for the following components:      Result Value   Sodium 134 (*)    CO2 20 (*)    Glucose, Bld 303 (*)    All other components within normal limits  CBC - Abnormal; Notable for the following components:   RBC 5.27 (*)    Hemoglobin 11.9 (*)    MCV 71.9 (*)    MCH 22.6 (*)    RDW 18.6 (*)    All other components within normal limits  LIPASE, BLOOD  URINALYSIS, ROUTINE W REFLEX MICROSCOPIC  HCG, QUANTITATIVE, PREGNANCY     EKG  Interpreted  by me at 1440 heart rate 110 QRS 90 QTc 450.  Artifact present.  No evidence of acute ischemia.   RADIOLOGY     Reviewed patient's previous CT scans similar symptomatology in the past with previous abdominal imaging.  Discussed with the patient, she reports that she is having the same symptoms recurring again.  Said attractively same symptoms multiple times.  Carries diagnosis of gastroparesis.  At this point, I do not think they be a high yield for repeat CT imaging.  Discussed with the patient and she reports very confidently that these are the symptoms she is experienced previous with problems with her diabetic gastroparesis.  PROCEDURES:  Critical Care performed: No  Procedures   MEDICATIONS ORDERED IN ED: Medications  promethazine (PHENERGAN) injection 25 mg (has no administration in time range)  sodium chloride 0.9 % bolus 500 mL (0 mLs Intravenous Stopped 11/30/23 1945)  morphine (PF) 4 MG/ML injection 4 mg (4 mg Intravenous Given 11/30/23 1656)  morphine (PF) 4 MG/ML  injection 4 mg (4 mg Intravenous Given 11/30/23 1945)     IMPRESSION / MDM / ASSESSMENT AND PLAN / ED COURSE  I reviewed the triage vital signs and the nursing notes.                              Differential diagnosis includes but is not limited to, abdominal perforation, aortic dissection, cholecystitis, appendicitis, diverticulitis, colitis, esophagitis/gastritis, kidney stone, pyelonephritis, urinary tract infection, aortic aneurysm. All are considered in decision and treatment plan. Based upon the patient's presentation and risk factors, most of her previous clinical history and her presentation today with her what she describes very recurrent symptomatology think we have best manage her symptomatically at this time.  Do not believe there be a high yield from repeat imaging.  She has no associated cardiopulmonary symptoms today no fever  Overall she appears nontoxic but reports fairly severe persistent and intractable mid abdominal pain.  She is not actively vomiting she is fully awake and alert.  Discussed with her previous medications, and after discussion as well as she reports that she does not like the way that droperidol and Haldol has made her feel in the past leading her to feel "disoriented" will trial Phenergan and morphine and reassess.  Labs reassuring, CO2 minimally reduced at 20.  Glucose 300 no evidence of DKA.  Normal white count  Patient's presentation is most consistent with acute complicated illness / injury requiring diagnostic workup.   ----------------------------------------- 7:50 PM on 11/30/2023 ----------------------------------------- Patient reports that her abdominal pain will come and go.  It was briefly alleviated by morphine but now reports that has come back.  Currently awaiting Phenergan and she has had no further emesis reports very mild nausea.  Reports pain has increased once again in the abdomen.  Give additional dose of morphine, continue to observe for  symptomatic improvement.  Ongoing care assigned to L Dougherty PA, if patient's pain continues to persist and is intractable nature would consider obtaining repeat CT to evaluate for acute gross intra-abdominal etiology though my pretest probability for this at this point remains low based on her previous clinical history and today's evaluation.  Additionally, if patient's pain and symptoms are intractable given her previous history of gastroparesis would consider potential observation for treatment and pain control based on further assessments. No urine sample yet, also awaiting urine pregnancy for some time, intead had added on HCG, follow-up.  FINAL CLINICAL IMPRESSION(S) / ED DIAGNOSES   Final diagnoses:  Recurrent abdominal pain     Rx / DC Orders   ED Discharge Orders     None        Note:  This document was prepared using Dragon voice recognition software and may include unintentional dictation errors.   Sharyn Creamer, MD 11/30/23 2025

## 2023-11-30 NOTE — ED Provider Notes (Signed)
-----------------------------------------   10:15 PM on 11/30/2023 -----------------------------------------  Blood pressure 136/69, pulse (!) 104, temperature 98.7 F (37.1 C), temperature source Oral, resp. rate 17, height 5\' 6"  (1.676 m), weight 108.8 kg, last menstrual period 10/07/2023, SpO2 97%.  Assuming care from Dr. Fanny Bien.  In short, Karen Ruiz is a 43 y.o. female with a chief complaint of Abdominal Pain .  Refer to the original H&P for additional details.  The current plan of care is to after medication administration and then discharge is improving.  ____________________________________________    ED Results / Procedures / Treatments   Labs (all labs ordered are listed, but only abnormal results are displayed) Labs Reviewed  COMPREHENSIVE METABOLIC PANEL - Abnormal; Notable for the following components:      Result Value   Sodium 134 (*)    CO2 20 (*)    Glucose, Bld 303 (*)    All other components within normal limits  CBC - Abnormal; Notable for the following components:   RBC 5.27 (*)    Hemoglobin 11.9 (*)    MCV 71.9 (*)    MCH 22.6 (*)    RDW 18.6 (*)    All other components within normal limits  LIPASE, BLOOD  URINALYSIS, ROUTINE W REFLEX MICROSCOPIC  HCG, QUANTITATIVE, PREGNANCY    No results found.   PROCEDURES:  Critical Care performed: No  Procedures   MEDICATIONS ORDERED IN ED: Medications  promethazine (PHENERGAN) injection 25 mg (has no administration in time range)  sodium chloride 0.9 % bolus 500 mL (0 mLs Intravenous Stopped 11/30/23 1945)  morphine (PF) 4 MG/ML injection 4 mg (4 mg Intravenous Given 11/30/23 1656)  morphine (PF) 4 MG/ML injection 4 mg (4 mg Intravenous Given 11/30/23 1945)     IMPRESSION / MDM / ASSESSMENT AND PLAN / ED COURSE  I reviewed the triage vital signs and the nursing notes.                             43 year old female presents for evaluation of abdominal pain with nausea and vomiting.  Patient is  tachycardic but vital signs are stable otherwise.  Differential diagnosis includes, but is not limited to, gastroparesis.  Patient's presentation is most consistent with acute complicated illness / injury requiring diagnostic workup.  Patient reports an improvement in her pain after administration of the morphine.  She never did actually get the Phenergan but denies any nausea at this time.  Offered to send some nausea medication for her to take outpatient which she declined.  Patient's diagnosis is consistent with recurrent abdominal pain. Patient will be discharged home with prescriptions for pain medication. Patient is to follow up with PCP as needed or otherwise directed. Patient is given ED precautions to return to the ED for any worsening or new symptoms.     FINAL CLINICAL IMPRESSION(S) / ED DIAGNOSES   Final diagnoses:  Recurrent abdominal pain     Rx / DC Orders   ED Discharge Orders          Ordered    HYDROcodone-acetaminophen (NORCO/VICODIN) 5-325 MG tablet  Every 4 hours PRN        11/30/23 2217             Note:  This document was prepared using Dragon voice recognition software and may include unintentional dictation errors.    Cameron Ali, PA-C 11/30/23 2218    Minna Antis, MD 11/30/23 2252

## 2023-11-30 NOTE — Discharge Instructions (Addendum)
 Please follow-up with your primary care provider as needed.  Return to the emergency department with any worsening symptoms.

## 2023-12-01 MED ORDER — OXYCODONE HCL 5 MG PO TABS: 5.0000 mg | ORAL_TABLET | Freq: Four times a day (QID) | ORAL | 0 refills | Status: AC | PRN

## 2023-12-01 NOTE — ED Provider Notes (Signed)
 12:47 AMFamily at front desk asking that prescription sent to [facility] pharmacy is not able to be filled due to insurance issues.   I discussed with family just waiting till tomorrow to pick up the prescriptions but they were very upset about this.  I discussed with them that I would need to confirm with the pharmacy that the prescriptions could be canceled at the [facility].  I called pharm and confirmed that since the [facility] [place] prescription was [facility] by [facility] (although not open) so they can't get insurance to pay for the same prescription- I changed to oxycodone 5mg  for 20 pills and canceled the hydrocodone at the pharmracy in [place]-I will then call the [facility] in [place] to ensure that this prescription also gets canceled and leave a message given they are closed at this time.     Concha Se, MD 12/01/23 [phone]

## 2023-12-16 NOTE — Discharge Summary (Signed)
   Patient expressed wish to leave against medical advice.   Physician Discharge Summary   Patient: Karen Ruiz MRN: [id number] DOB: 1981-08-22  Admit date:     11/06/2023  Discharge date: 11/06/2023  Discharge Physician: Greig SAILOR Ayjah Show   PCP: Clora Marlee RODES, MD   Discharge Diagnoses: Principal Problem:   Intractable nausea and vomiting Active Problems:   Uncontrolled type 2 diabetes mellitus with hyperglycemia (HCC)   Gastroparesis   Dyslipidemia   Tobacco abuse   Essential hypertension   Mixed hyperlipidemia   Polysubstance abuse (HCC)   Coronary artery disease involving native coronary artery of native heart   Marijuana abuse   Morbid obesity (HCC)  Resolved Problems:   * No resolved hospital problems. [facility] Course: Ms. [name] is a 43 year old female with history of hyperlipidemia, hypertension, insulin -dependent diabetes mellitus, GERD, gastroparesis, history of THC use, who presents emergency department for chief concerns of intractable nausea, vomiting.  Vitals in the ED showed temperature of 98, respiration rate 20, heart rate 90, blood pressure 178/97, SpO2 of 96% on room air.  Serum sodium is 136, potassium 3.5, chloride 106, bicarb 20, BUN of 16, serum creatinine 0.57, EGFR greater than 60, nonfasting blood glucose 284, WBC 7.0, hemoglobin 11.7, platelets of 293.  HS troponin is 11 and on repeat is 11.  Pregnancy screening was negative.  UDS was positive for THC.  ED treatment: Sodium chloride  1 L bolus, Toradol  50 mg IM one-time dose, droperidol  5 mg IV one-time dose, Benadryl  25 mg IV one-time dose.  Compazine  10 mg p.o. one-time dose.   Signed: Dr. Sherre [facility] 12/16/2023

## 2024-02-22 ENCOUNTER — Other Ambulatory Visit: Payer: Self-pay

## 2024-02-22 ENCOUNTER — Emergency Department
Admission: EM | Admit: 2024-02-22 | Discharge: 2024-02-22 | Disposition: A | Discharge status: Home / Self Care | Attending: Emergency Medicine | Admitting: Emergency Medicine

## 2024-02-22 DIAGNOSIS — R1084 Generalized abdominal pain: Secondary | ICD-10-CM

## 2024-02-22 DIAGNOSIS — E1143 Type 2 diabetes mellitus with diabetic autonomic (poly)neuropathy: Secondary | ICD-10-CM | POA: Insufficient documentation

## 2024-02-22 DIAGNOSIS — K3184 Gastroparesis: Secondary | ICD-10-CM | POA: Diagnosis not present

## 2024-02-22 DIAGNOSIS — R112 Nausea with vomiting, unspecified: Secondary | ICD-10-CM

## 2024-02-22 LAB — CBC
HCT: 42.1 % (ref 36.0–46.0)
Hemoglobin: 13.2 g/dL (ref 12.0–15.0)
MCH: 23.2 pg — ABNORMAL LOW (ref 26.0–34.0)
MCHC: 31.4 g/dL (ref 30.0–36.0)
MCV: 73.9 fL — ABNORMAL LOW (ref 80.0–100.0)
Platelets: 381 10*3/uL (ref 150–400)
RBC: 5.7 MIL/uL — ABNORMAL HIGH (ref 3.87–5.11)
RDW: 18.2 % — ABNORMAL HIGH (ref 11.5–15.5)
WBC: 16.8 10*3/uL — ABNORMAL HIGH (ref 4.0–10.5)
nRBC: 0 % (ref 0.0–0.2)

## 2024-02-22 LAB — COMPREHENSIVE METABOLIC PANEL WITH GFR
ALT: 26 U/L (ref 0–44)
AST: 31 U/L (ref 15–41)
Albumin: 4.3 g/dL (ref 3.5–5.0)
Alkaline Phosphatase: 79 U/L (ref 38–126)
Anion gap: 14 (ref 5–15)
BUN: 25 mg/dL — ABNORMAL HIGH (ref 6–20)
CO2: 19 mmol/L — ABNORMAL LOW (ref 22–32)
Calcium: 9.5 mg/dL (ref 8.9–10.3)
Chloride: 108 mmol/L (ref 98–111)
Creatinine, Ser: 0.61 mg/dL (ref 0.44–1.00)
GFR, Estimated: >60 mL/min (ref >60)
Glucose, Bld: 178 mg/dL — ABNORMAL HIGH (ref 70–99)
Potassium: 4.1 mmol/L (ref 3.5–5.1)
Sodium: 141 mmol/L (ref 135–145)
Total Bilirubin: 0.8 mg/dL (ref 0.0–1.2)
Total Protein: 8.1 g/dL (ref 6.5–8.1)

## 2024-02-22 LAB — LIPASE, BLOOD: Lipase: 23 U/L (ref 11–51)

## 2024-02-22 MED ORDER — DROPERIDOL 2.5 MG/ML IJ SOLN
2.5000 mg | Freq: Once | INTRAMUSCULAR | Status: AC
  Administered 2024-02-22: 2.5 mg via INTRAVENOUS
  Filled 2024-02-22: qty 2

## 2024-02-22 NOTE — ED Triage Notes (Signed)
 C/O abdominal pain, onset last night.  Also vomiting, and nausea.  States has history of gastroparesis.  Takes Reglan  and Protonix  at home.  Last episode similar to this was in February.  Also c/o chest pain.  Stents placed in 2024, last is November.

## 2024-02-22 NOTE — ED Notes (Signed)
 Pt wheeled out to the lobby in a wheel chair to wait for husband to pick her up.

## 2024-02-22 NOTE — ED Notes (Signed)
 Pt given ginger ale and crackers for a PO challenge per MD's request.

## 2024-02-22 NOTE — ED Notes (Signed)
 Pt was able to drink a couple ounces of ginger ale. Pt stated she is on ozempic and can not stand the thought of eating crackers.

## 2024-02-22 NOTE — ED Provider Notes (Signed)
 [facility] Provider Note   Event Date/Time   First MD Initiated Contact with Patient 02/22/24 1719     (approximate) History  Abdominal Pain  HPI Karen Ruiz is a 43 y.o. female with a past medical history of uncontrolled type 2 diabetes, gastroparesis, migraines, obesity, and chronic abdominal pain who presents complaining of abdominal pain.  Patient arrives moaning and screaming constantly.  Patient also endorses nausea and vomiting.  Patient states that she takes Reglan  and Protonix  at home but have not been working as she has been vomiting them up.  Patient also complains of burning chest pain. ROS: Patient currently denies any vision changes, tinnitus, difficulty speaking, facial droop, sore throat, chest pain, shortness of breath, diarrhea, dysuria, or weakness/numbness/paresthesias in any extremity   Physical Exam  Triage Vital Signs: ED Triage Vitals  Encounter Vitals Group     BP 02/22/24 1716 (!) 151/126     Systolic BP Percentile --      Diastolic BP Percentile --      Pulse Rate 02/22/24 1716 (!) 114     Resp 02/22/24 1716 16     Temp 02/22/24 1716 98.4 F (36.9 C)     Temp Source 02/22/24 1716 Oral     SpO2 02/22/24 1716 99 %     Weight 02/22/24 1714 239 lb 13.8 oz (108.8 kg)     Height --      Head Circumference --      Peak Flow --      Pain Score 02/22/24 1713 10     Pain Loc --      Pain Education --      Exclude from Growth Chart --    Most recent vital signs: Vitals:   02/22/24 1900 02/22/24 2000  BP: (!) 157/89 (!) 161/85  Pulse: 97 92  Resp: (!) 23 (!) 23  Temp:    SpO2: 99% 100%   General: Awake, oriented x4. CV:  Good peripheral perfusion.  Resp:  Normal effort.  Abd:  No distention.  Other:  Middle-aged obese Caucasian female moaning and screaming in stretcher in moderate distress ED Results / Procedures / Treatments  Labs (all labs ordered are listed, but only abnormal results are displayed) Labs Reviewed   COMPREHENSIVE METABOLIC PANEL WITH GFR - Abnormal; Notable for the following components:      Result Value   CO2 19 (*)    Glucose, Bld 178 (*)    BUN 25 (*)    All other components within normal limits  CBC - Abnormal; Notable for the following components:   WBC 16.8 (*)    RBC 5.70 (*)    MCV 73.9 (*)    MCH 23.2 (*)    RDW 18.2 (*)    All other components within normal limits  LIPASE, BLOOD   EKG ED ECG REPORT I, Charleen Conn, the attending physician, personally viewed and interpreted this ECG. Date: 02/22/2024 EKG Time: 1746 Rate: 108 Rhythm: Tachycardic sinus rhythm QRS Axis: normal Intervals: normal ST/T Wave abnormalities: normal Narrative Interpretation: Tachycardic sinus rhythm.  No evidence of acute ischemia PROCEDURES: Critical Care performed: No Procedures MEDICATIONS ORDERED IN ED: Medications  droperidol  (INAPSINE ) 2.5 MG/ML injection 2.5 mg (2.5 mg Intravenous Given 02/22/24 1734)   IMPRESSION / MDM / ASSESSMENT AND PLAN / ED COURSE  I reviewed the triage vital signs and the nursing notes.  The patient is on the cardiac monitor to evaluate for evidence of arrhythmia and/or significant heart rate changes. Patient's presentation is most consistent with acute presentation with potential threat to life or bodily function. Patient presents for acute nausea/vomiting The cause of the patient's symptoms is not clear, but likely related to patient's gastroenteritis and chronic abdominal pain  Given History and Exam there does not appear to be an emergent cause of the symptoms such as small bowel obstruction, coronary syndrome, bowel ischemia, DKA, pancreatitis, appendicitis, other acute abdomen or other emergent problem.  Reassessment: After treatment, the patient is resting comfortably, tolerating PO fluids, and shows no signs of dehydration.   Disposition: Discharge home with prompt primary care physician follow up in the next 48  hours. Strict return precautions discussed.   FINAL CLINICAL IMPRESSION(S) / ED DIAGNOSES   Final diagnoses:  Generalized abdominal pain  Nausea and vomiting, unspecified vomiting type  Gastroparesis   Rx / DC Orders   ED Discharge Orders     None      Note:  This document was prepared using Dragon voice recognition software and may include unintentional dictation errors.   Haward Pope K, MD 02/24/24 [phone]

## 2024-02-23 ENCOUNTER — Other Ambulatory Visit: Payer: Self-pay

## 2024-02-23 ENCOUNTER — Emergency Department
Admission: EM | Admit: 2024-02-23 | Discharge: 2024-02-24 | Disposition: A | Discharge status: Home / Self Care | Attending: Emergency Medicine | Admitting: Emergency Medicine

## 2024-02-23 ENCOUNTER — Emergency Department
Admission: EM | Admit: 2024-02-23 | Discharge: 2024-02-23 | Disposition: A | Discharge status: Home / Self Care | Attending: Emergency Medicine | Admitting: Emergency Medicine

## 2024-02-23 DIAGNOSIS — Z794 Long term (current) use of insulin: Secondary | ICD-10-CM | POA: Diagnosis not present

## 2024-02-23 DIAGNOSIS — I2510 Atherosclerotic heart disease of native coronary artery without angina pectoris: Secondary | ICD-10-CM | POA: Diagnosis not present

## 2024-02-23 DIAGNOSIS — R1084 Generalized abdominal pain: Secondary | ICD-10-CM

## 2024-02-23 DIAGNOSIS — Z7984 Long term (current) use of oral hypoglycemic drugs: Secondary | ICD-10-CM | POA: Insufficient documentation

## 2024-02-23 DIAGNOSIS — E1143 Type 2 diabetes mellitus with diabetic autonomic (poly)neuropathy: Secondary | ICD-10-CM | POA: Insufficient documentation

## 2024-02-23 DIAGNOSIS — D72829 Elevated white blood cell count, unspecified: Secondary | ICD-10-CM | POA: Insufficient documentation

## 2024-02-23 DIAGNOSIS — K3184 Gastroparesis: Secondary | ICD-10-CM | POA: Diagnosis not present

## 2024-02-23 DIAGNOSIS — Z7982 Long term (current) use of aspirin: Secondary | ICD-10-CM | POA: Diagnosis not present

## 2024-02-23 DIAGNOSIS — R1115 Cyclical vomiting syndrome unrelated to migraine: Secondary | ICD-10-CM | POA: Insufficient documentation

## 2024-02-23 DIAGNOSIS — R109 Unspecified abdominal pain: Secondary | ICD-10-CM | POA: Diagnosis present

## 2024-02-23 DIAGNOSIS — E119 Type 2 diabetes mellitus without complications: Secondary | ICD-10-CM | POA: Insufficient documentation

## 2024-02-23 LAB — COMPREHENSIVE METABOLIC PANEL WITH GFR
ALT: 23 U/L (ref 0–44)
AST: 23 U/L (ref 15–41)
Albumin: 4 g/dL (ref 3.5–5.0)
Alkaline Phosphatase: 72 U/L (ref 38–126)
Anion gap: 15 (ref 5–15)
BUN: 25 mg/dL — ABNORMAL HIGH (ref 6–20)
CO2: 22 mmol/L (ref 22–32)
Calcium: 9.8 mg/dL (ref 8.9–10.3)
Chloride: 103 mmol/L (ref 98–111)
Creatinine, Ser: 0.63 mg/dL (ref 0.44–1.00)
GFR, Estimated: >60 mL/min (ref >60)
Glucose, Bld: 166 mg/dL — ABNORMAL HIGH (ref 70–99)
Potassium: 3.3 mmol/L — ABNORMAL LOW (ref 3.5–5.1)
Sodium: 140 mmol/L (ref 135–145)
Total Bilirubin: 0.7 mg/dL (ref 0.0–1.2)
Total Protein: 7.7 g/dL (ref 6.5–8.1)

## 2024-02-23 LAB — HCG, QUANTITATIVE, PREGNANCY: hCG, Beta Chain, Quant, S: 2 m[IU]/mL (ref <5)

## 2024-02-23 LAB — TROPONIN I (HIGH SENSITIVITY)
Troponin I (High Sensitivity): 21 ng/L — ABNORMAL HIGH (ref <18)
Troponin I (High Sensitivity): 20 ng/L — ABNORMAL HIGH (ref <18)

## 2024-02-23 LAB — CBC
HCT: 40.7 % (ref 36.0–46.0)
Hemoglobin: 13 g/dL (ref 12.0–15.0)
MCH: 23.4 pg — ABNORMAL LOW (ref 26.0–34.0)
MCHC: 31.9 g/dL (ref 30.0–36.0)
MCV: 73.3 fL — ABNORMAL LOW (ref 80.0–100.0)
Platelets: 344 10*3/uL (ref 150–400)
RBC: 5.55 MIL/uL — ABNORMAL HIGH (ref 3.87–5.11)
RDW: 18.3 % — ABNORMAL HIGH (ref 11.5–15.5)
WBC: 14.9 10*3/uL — ABNORMAL HIGH (ref 4.0–10.5)
nRBC: 0 % (ref 0.0–0.2)

## 2024-02-23 LAB — MAGNESIUM: Magnesium: 1.9 mg/dL (ref 1.7–2.4)

## 2024-02-23 LAB — LIPASE, BLOOD: Lipase: 28 U/L (ref 11–51)

## 2024-02-23 MED ORDER — LORAZEPAM 2 MG/ML IJ SOLN
1.0000 mg | Freq: Once | INTRAMUSCULAR | Status: AC
  Administered 2024-02-23: 1 mg via INTRAVENOUS
  Filled 2024-02-23: qty 1

## 2024-02-23 MED ORDER — DROPERIDOL 2.5 MG/ML IJ SOLN: 2.5000 mg | Freq: Once | INTRAMUSCULAR | Status: DC

## 2024-02-23 MED ORDER — POTASSIUM CHLORIDE 20 MEQ PO PACK
40.0000 meq | PACK | Freq: Once | ORAL | Status: AC
Start: 2024-02-23 — End: 2024-02-23
  Administered 2024-02-23: 40 meq via ORAL
  Filled 2024-02-23: qty 2

## 2024-02-23 MED ORDER — SODIUM CHLORIDE 0.9 % IV BOLUS (SEPSIS)
1000.0000 mL | Freq: Once | INTRAVENOUS | Status: AC
  Administered 2024-02-23: 1000 mL via INTRAVENOUS

## 2024-02-23 MED ORDER — DICYCLOMINE HCL 20 MG PO TABS: 20.0000 mg | ORAL_TABLET | Freq: Three times a day (TID) | ORAL | 0 refills | Status: DC | PRN

## 2024-02-23 MED ORDER — HALOPERIDOL LACTATE 5 MG/ML IJ SOLN
2.0000 mg | Freq: Once | INTRAMUSCULAR | Status: AC
  Administered 2024-02-23: 2 mg via INTRAVENOUS
  Filled 2024-02-23: qty 1

## 2024-02-23 NOTE — ED Notes (Addendum)
 This RN went to discharge patient and go over discharge instructions with patient. Husband at bedside asking questions about what medications were given yesterday when patient was here for the same diagnosis.This RN explained Dr. Author Board could come back in to explain results to patient now that patient is awake. Husband asked what medications were given this visit and how they were different from the visit yesterday. This RN explained that I would need to go back into the patients chart and look that information up. This RN went into patients chart from previous visit to see what medication was given. I told the husband that patient was given droperidol  and this visit patient was given haldol , ativan , and NS bolus. Patients husband became agitated and stated, "I thought you were going to get the doctor back in here to go over the results. What kind of level of care is this?" Husband continued to raise voice at this RN and began cussing at this RN, stating "I don't give a fuck what you have to say, lady." This RN asked the husband to not cuss and raise voice. Husband continued to cuss and raise his voice. This RN walked out and notified Dr. Author Board of situation and patients husband comes out of the room into the nurses station and continues to raise voice and become more agitated. Press photographer and security notified. Dr. Author Board and security to bedside.

## 2024-02-23 NOTE — ED Provider Notes (Signed)
 [facility] Provider Note    None    (approximate)   History   Abdominal Pain   HPI  Karen Ruiz is a 43 y.o. female  with history of coronary artery disease, migraines, diabetes, gastroparesis who presents to the emergency department generalized abdominal pain that started yesterday with nausea and vomiting.  States this feels like her prior episodes of gastroparesis.  She was seen in the emergency department earlier this evening and given droperidol .  States that she felt a little bit better and was drowsy but then after the medication wore off symptoms have returned.  No diarrhea, dysuria hematuria, vaginal bleeding or discharge.  She denies chest pain or shortness of breath.  No fever.  Abdomen is nontender.  She states it "hurts on the inside".   History provided by patient and husband.    Past Medical History:  Diagnosis Date   Acute ST elevation myocardial infarction (STEMI) involving other coronary artery of inferior wall (HCC) 07/21/2023   Barrett esophagus    Diabetes mellitus without complication (HCC)    Gastroparesis    Migraines    MRSA (methicillin resistant staph aureus) culture positive    2009   Obesity    ST elevation myocardial infarction (STEMI) (HCC) 05/02/2021   Type 2 diabetes mellitus (HCC) 09/06/2016   Last Assessment & Plan:    Ordered new glucometer and test strips  Last Assessment & Plan:    Ordered new glucometer and test strips      Past Surgical History:  Procedure Laterality Date   CARDIAC CATHETERIZATION     CESAREAN SECTION     CORONARY STENT INTERVENTION N/A 05/02/2021   Procedure: CORONARY STENT INTERVENTION;  Surgeon: Sammy Crisp, MD;  Location: [facility] INVASIVE CV [facility];  Service: Cardiovascular;  Laterality: N/A;   CORONARY ULTRASOUND/IVUS N/A 05/02/2021   Procedure: Intravascular Ultrasound/IVUS;  Surgeon: Sammy Crisp, MD;  Location: [facility] INV[facility] CV [facility];  Service: Cardiovascular;  Laterality:  N/A;   CORONARY ULTRASOUND/IVUS N/A 07/23/2023   Procedure: Coronary Ultrasound/IVUS;  Surgeon: Wenona Hamilton, MD;  Location: [facility] CV [facility];  Service: Cardiovascular;  Laterality: N/A;   CORONARY/GRAFT ACUTE MI REVASCULARIZATION N/A 07/20/2023   Procedure: Coronary/Graft Acute MI Revascularization;  Surgeon: Wenona Hamilton, MD;  Location: [facility] IN[facility];  Service: Cardiovascular;  Laterality: N/A;   CORONARY/GRAFT ACUTE MI REVASCULARIZATION N/A 07/23/2023   Procedure: Coronary/Graft Acute MI Revascularization;  Surgeon: Wenona Hamilton, MD;  Location: [facility];  Service: Cardiovascular;  Laterality: N/A;   ESOPHAGOGASTRODUODENOSCOPY N/A 07/28/2019   Procedure: ESOPHAGOGASTRODUODENOSCOPY (EGD);  Surgeon: Selena Daily, MD;  Location: [facility] ENDOSCOPY;  Service: Gastroenterology;  Laterality: N/A;   LEFT HEART CATH AND CORONARY ANGIOGRAPHY N/A 05/02/2021   Procedure: LEFT HEART CATH AND CORONARY ANGIOGRAPHY;  Surgeon: Sammy Crisp, MD;  Location: [facility];  Service: Cardiovascular;  Laterality: N/A;   LEFT HEART CATH AND CORONARY ANGIOGRAPHY N/A 07/20/2023   Procedure: LEFT HEART CATH AND CORONARY ANGIOGRAPHY;  Surgeon: Wenona Hamilton, MD;  Location: [facility];  Service: Cardiovascular;  Laterality: N/A;   LEFT HEART CATH AND CORONARY ANGIOGRAPHY N/A 07/23/2023   Procedure: LEFT HEART CATH AND CORONARY ANGIOGRAPHY;  Surgeon: Wenona Hamilton, MD;  Location: [facility];  Service: Cardiovascular;  Laterality: N/A;    MEDICATIONS:  Prior to Admission medications   Medication Sig Start Date End Date Taking? Authorizing Provider  aspirin  EC 81 MG EC tablet Take 1  tablet (81 mg total) by mouth daily. Swallow whole. 05/04/21   Brenna Cam, MD  atorvastatin  (LIPITOR ) 80 MG tablet Take 1 tablet (80 mg total) by mouth daily. 07/22/23 10/20/23  Amin, Sumayya, MD  carvedilol (COREG) 6.25 MG tablet Take by mouth. 03/12/23   [provider]  diltiazem  (CARDIZEM  CD) 120 MG 24 hr capsule Take 1 capsule (120 mg total) by mouth daily. 08/01/23 09/30/23  Tiajuana Fluke, MD  escitalopram  (LEXAPRO ) 10 MG tablet Take 1 tablet (10 mg total) by mouth daily. 07/22/23 10/20/23  Amin, Sumayya, MD  Evolocumab  (REPATHA  SURECLICK) 140 MG/ML SOAJ Inject 140 mg into the skin every 14 (fourteen) days. 07/31/23   Gollan, Timothy J, MD  ezetimibe  (ZETIA ) 10 MG tablet Take by mouth. 04/02/23   [provider]  furosemide (LASIX) 40 MG tablet Take by mouth. 04/01/23 03/31/24  [provider]  insulin  aspart (NOVOLOG ) 100 UNIT/ML injection Inject 0-9 Units into the skin 3 (three) times daily with meals. 07/16/19   Konidena, Snehalatha, MD  JANUVIA 100 MG tablet Take 1 tablet by mouth daily. 03/12/23   [provider]  LANTUS  SOLOSTAR 100 UNIT/ML Solostar Pen Inject into the skin. 03/20/23   [provider]  losartan  (COZAAR ) 50 MG tablet Take 1 tablet by mouth daily. 05/10/23   [provider]  metoprolol  succinate (TOPROL -XL) 100 MG 24 hr tablet Take 100 mg by mouth daily.    [provider]  pantoprazole  (PROTONIX ) 40 MG tablet Take 1 tablet (40 mg total) by mouth daily. PLEASE SCHEDULE OFFICE VISIT FOR FURTHER REFILLS. THANK YOU! 07/22/23 10/20/23  Luna Salinas, MD  promethazine  (PHENERGAN ) 25 MG suppository Place 1 suppository (25 mg total) rectally every 8 (eight) hours as needed for refractory nausea / vomiting. 09/26/23   Bradler, Evan K, MD  promethazine  (PHENERGAN ) 25 MG tablet Take 1 tablet (25 mg total) by mouth every 6 (six) hours as needed for nausea or vomiting. 09/26/23   Charleen Conn, MD    Physical Exam   Triage Vital Signs: ED Triage Vitals  Encounter Vitals Group     BP 02/23/24 0328 (!) 127/110     Systolic BP Percentile --      Diastolic BP Percentile --      Pulse Rate 02/23/24 0328 100     Resp 02/23/24 0328 20     Temp 02/23/24 0328 98.4 F (36.9 C)     Temp  Source 02/23/24 0328 Oral     SpO2 02/23/24 0328 100 %     Weight 02/23/24 0329 225 lb (102.1 kg)     Height 02/23/24 0329 5\' 6"  (1.676 m)     Head Circumference --      Peak Flow --      Pain Score --      Pain Loc --      Pain Education --      Exclude from Growth Chart --     Most recent vital signs: Vitals:   02/23/24 0530 02/23/24 0630  BP: (!) 112/55 134/66  Pulse: (!) 109 (!) 101  Resp: (!) 23 (!) 27  Temp:    SpO2: 93% 95%    CONSTITUTIONAL: Alert, responds appropriately to questions.  Appears uncomfortable.  Arrives to the ED moaning in pain. HEAD: Normocephalic, atraumatic EYES: Conjunctivae clear, pupils appear equal, sclera nonicteric ENT: normal nose; moist mucous membranes NECK: Supple, normal ROM CARD: Regular tachycardic; S1 and S2 appreciated RESP: Normal chest excursion without splinting or tachypnea;  breath sounds clear and equal bilaterally; no wheezes, no rhonchi, no rales, no hypoxia or respiratory distress, speaking full sentences ABD/GI: Non-distended; soft, non-tender, no rebound, no guarding, no peritoneal signs BACK: The back appears normal EXT: Normal ROM in all joints; no deformity noted, no edema SKIN: Normal color for age and race; warm; no rash on exposed skin NEURO: Moves all extremities equally, normal speech PSYCH: The patient's mood and manner are appropriate.   ED Results / Procedures / Treatments   LABS: (all labs ordered are listed, but only abnormal results are displayed) Labs Reviewed  COMPREHENSIVE METABOLIC PANEL WITH GFR - Abnormal; Notable for the following components:      Result Value   Potassium 3.3 (*)    Glucose, Bld 166 (*)    BUN 25 (*)    All other components within normal limits  CBC - Abnormal; Notable for the following components:   WBC 14.9 (*)    RBC 5.55 (*)    MCV 73.3 (*)    MCH 23.4 (*)    RDW 18.3 (*)    All other components within normal limits  TROPONIN I (HIGH SENSITIVITY) - Abnormal; Notable for  the following components:   Troponin I (High Sensitivity) 21 (*)    All other components within normal limits  TROPONIN I (HIGH SENSITIVITY) - Abnormal; Notable for the following components:   Troponin I (High Sensitivity) 20 (*)    All other components within normal limits  LIPASE, BLOOD  MAGNESIUM  HCG, QUANTITATIVE, PREGNANCY     EKG:  EKG Interpretation Date/Time:  Sunday Feb 23 2024 04:46:09 EDT Ventricular Rate:  92 PR Interval:  153 QRS Duration:  103 QT Interval:  359 QTC Calculation: 445 R Axis:   116  Text Interpretation: Sinus rhythm Low voltage, precordial leads Consider right ventricular hypertrophy Confirmed by Verneda Golder [id number] on 02/23/2024 4:53:26 AM         RADIOLOGY: My personal review and interpretation of imaging:    I have personally reviewed all radiology reports.   No results found.   PROCEDURES:  Critical Care performed: No   CRITICAL CARE Performed by: Verneda Golder   Total critical care time: 0 minutes  Critical care time was exclusive of separately billable procedures and treating other patients.  Critical care was necessary to treat or prevent imminent or life-threatening deterioration.  Critical care was time spent personally by me on the following activities: development of treatment plan with patient and/or surrogate as well as nursing, discussions with consultants, evaluation of patient's response to treatment, examination of patient, obtaining history from patient or surrogate, ordering and performing treatments and interventions, ordering and review of laboratory studies, ordering and review of radiographic studies, pulse oximetry and re-evaluation of patient's condition.   Procedures    IMPRESSION / MDM / ASSESSMENT AND PLAN / ED COURSE  I reviewed the triage vital signs and the nursing notes.    Patient here with diabetes, gastroparesis followed by [place] who presents emergency room generalized abdominal pain and  vomiting.  The patient is on the cardiac monitor to evaluate for evidence of arrhythmia and/or significant heart rate changes.   DIFFERENTIAL DIAGNOSIS (includes but not limited to):   Gastroparesis, gastritis, GERD, less likely appendicitis, bowel obstruction, diverticulitis, colitis, cholecystitis, pancreatitis given benign exam.  Patient denies urinary symptoms, GU symptoms.  Doubt UTI, STI, PID.   Patient's presentation is most consistent with acute presentation with potential threat to life or bodily function.   PLAN: Will  obtain labs, urine.  Will give IV fluids, Haldol , Ativan  for symptomatic relief.  She states that she does not want to take droperidol  again.  She states it made her feel drowsy and been jittery.   MEDICATIONS GIVEN IN ED: Medications  haloperidol  lactate (HALDOL ) injection 2 mg (2 mg Intravenous Given 02/23/24 0421)  LORazepam  (ATIVAN ) injection 1 mg (1 mg Intravenous Given 02/23/24 0422)  sodium chloride  0.9 % bolus 1,000 mL (0 mLs Intravenous Stopped 02/23/24 0549)  potassium chloride  (KLOR-CON ) packet 40 mEq (40 mEq Oral Given 02/23/24 0551)     ED COURSE: EKG shows no new ischemic change.  Normal hemoglobin, electrolytes, LFTs, lipase.  Troponin x 2 minimally elevated but flat.  Patient has remained hemodynamically stable here.  She did have a brief episode of hypoxia that resolved after getting medications.  She states her pain is better and she is tolerating p.o.  She has been monitored in the ED for several hours.  I have offered to obtain urine sample but patient declines because she has no urinary symptoms.  She does state that she uses marijuana at home and we discussed that cannabinol hyperemesis syndrome could be playing a role.  She does have Reglan  for home.  Have encouraged her follow-up with her GI doctor at [facility].  Will discharge with a prescription of Bentyl as well.  We discussed that narcotic pain medications could indeed make gastroparesis worse given  they slow the gastric emptying and peristalsis.  Patient and husband verbalized understanding and are comfortable with this plan.  I feel she is safe for discharge home.   At this time, I do not feel there is any life-threatening condition present. I reviewed all nursing notes, vitals, pertinent previous records.  All lab and urine results, EKGs, imaging ordered have been independently reviewed and interpreted by myself.  I reviewed all available radiology reports from any imaging ordered this visit.  Based on my assessment, I feel the patient is safe to be discharged home without further emergent workup and can continue workup as an outpatient as needed. Discussed all findings, treatment plan as well as usual and customary return precautions.  They verbalize understanding and are comfortable with this plan.  Outpatient follow-up has been provided as needed.  All questions have been answered.    CONSULTS: Admission considered but patient symptoms well-controlled, tolerating p.o. and she is comfortable with plan for discharge home.   OUTSIDE RECORDS REVIEWED: Reviewed most recent [facility] notes.  Low bone lesion revealed going to bone       FINAL CLINICAL IMPRESSION(S) / ED DIAGNOSES   Final diagnoses:  Gastroparesis  Generalized abdominal pain     Rx / DC Orders   ED Discharge Orders          Ordered    dicyclomine (BENTYL) 20 MG tablet  Every 8 hours PRN        02/23/24 0729             Note:  This document was prepared using Dragon voice recognition software and may include unintentional dictation errors.   Trip Cavanagh, Clover Dao, DO 02/23/24 [phone]

## 2024-02-23 NOTE — ED Triage Notes (Addendum)
 Pt to ed from home via POV for abd pain. Pt has been seen twice for same in last 24 hours. Pt is alert, crying and moaning.

## 2024-02-23 NOTE — ED Triage Notes (Addendum)
 Per pt "I was here earlier today and I told them whatever they gave me wasn't going to be enough or last for the pain. I have gastroparesis and the pain is like you jumpped out of a window bad."  Pt a&o crying in triage

## 2024-02-24 ENCOUNTER — Emergency Department

## 2024-02-24 LAB — CBC WITH DIFFERENTIAL/PLATELET
Abs Immature Granulocytes: 0.03 10*3/uL (ref 0.00–0.07)
Basophils Absolute: 0 10*3/uL (ref 0.0–0.1)
Basophils Relative: 0 %
Eosinophils Absolute: 0.4 10*3/uL (ref 0.0–0.5)
Eosinophils Relative: 3 %
HCT: 39.9 % (ref 36.0–46.0)
Hemoglobin: 12.4 g/dL (ref 12.0–15.0)
Immature Granulocytes: 0 %
Lymphocytes Relative: 26 %
Lymphs Abs: 3.2 10*3/uL (ref 0.7–4.0)
MCH: 23 pg — ABNORMAL LOW (ref 26.0–34.0)
MCHC: 31.1 g/dL (ref 30.0–36.0)
MCV: 74.2 fL — ABNORMAL LOW (ref 80.0–100.0)
Monocytes Absolute: 1.1 10*3/uL — ABNORMAL HIGH (ref 0.1–1.0)
Monocytes Relative: 9 %
Neutro Abs: 7.7 10*3/uL (ref 1.7–7.7)
Neutrophils Relative %: 62 %
Platelets: 359 10*3/uL (ref 150–400)
RBC: 5.38 MIL/uL — ABNORMAL HIGH (ref 3.87–5.11)
RDW: 18.5 % — ABNORMAL HIGH (ref 11.5–15.5)
Smear Review: NORMAL
WBC: 12.5 10*3/uL — ABNORMAL HIGH (ref 4.0–10.5)
nRBC: 0 % (ref 0.0–0.2)

## 2024-02-24 LAB — LIPASE, BLOOD: Lipase: 25 U/L (ref 11–51)

## 2024-02-24 LAB — HCG, QUANTITATIVE, PREGNANCY: hCG, Beta Chain, Quant, S: 2 m[IU]/mL (ref <5)

## 2024-02-24 LAB — COMPREHENSIVE METABOLIC PANEL WITH GFR
ALT: 22 U/L (ref 0–44)
AST: 29 U/L (ref 15–41)
Albumin: 4 g/dL (ref 3.5–5.0)
Alkaline Phosphatase: 66 U/L (ref 38–126)
Anion gap: 13 (ref 5–15)
BUN: 24 mg/dL — ABNORMAL HIGH (ref 6–20)
CO2: 22 mmol/L (ref 22–32)
Calcium: 8.3 mg/dL — ABNORMAL LOW (ref 8.9–10.3)
Chloride: 102 mmol/L (ref 98–111)
Creatinine, Ser: 0.64 mg/dL (ref 0.44–1.00)
GFR, Estimated: >60 mL/min (ref >60)
Glucose, Bld: 171 mg/dL — ABNORMAL HIGH (ref 70–99)
Potassium: 4.3 mmol/L (ref 3.5–5.1)
Sodium: 137 mmol/L (ref 135–145)
Total Bilirubin: 0.7 mg/dL (ref 0.0–1.2)
Total Protein: 7.3 g/dL (ref 6.5–8.1)

## 2024-02-24 MED ORDER — DICYCLOMINE HCL 10 MG PO CAPS: 20.0000 mg | ORAL_CAPSULE | Freq: Three times a day (TID) | ORAL | 0 refills | Status: AC | PRN

## 2024-02-24 MED ORDER — DIPHENHYDRAMINE HCL 50 MG/ML IJ SOLN
50.0000 mg | INTRAMUSCULAR | Status: AC
  Administered 2024-02-24: 50 mg via INTRAVENOUS
  Filled 2024-02-24: qty 1

## 2024-02-24 MED ORDER — IOHEXOL 350 MG/ML SOLN
100.0000 mL | Freq: Once | INTRAVENOUS | Status: AC | PRN
  Administered 2024-02-24: 100 mL via INTRAVENOUS

## 2024-02-24 MED ORDER — DICYCLOMINE HCL 10 MG PO CAPS
20.0000 mg | ORAL_CAPSULE | Freq: Once | ORAL | Status: AC
  Administered 2024-02-24: 20 mg via ORAL
  Filled 2024-02-24: qty 2

## 2024-02-24 MED ORDER — DROPERIDOL 2.5 MG/ML IJ SOLN
2.5000 mg | Freq: Once | INTRAMUSCULAR | Status: AC
  Administered 2024-02-24: 2.5 mg via INTRAVENOUS
  Filled 2024-02-24: qty 2

## 2024-02-24 NOTE — ED Provider Notes (Signed)
 [facility] Provider Note    Event Date/Time   First MD Initiated Contact with Patient 02/24/24 0003     (approximate)   History   Abdominal Pain   HPI Karen Ruiz Karen Ruiz is a 43 y.o. female well-known to the emergency department for frequent visits due to gastroparesis and/or cannabinoid hyperemesis syndrome.  This is the patient's third visit in approximately 24 hours for the same issue.  She is reporting severe abdominal pain all throughout the abdomen consistent with her prior episodes of gastroparesis.  She reports continued use of marijuana but adamantly and angrily states that this is not what is going on.  Exam and history are somewhat limited by the patient's screaming and wailing in her exam bed in the hallway.  She states that during the last 2 visits in the emergency department, she has felt better for short period of time and then the pain starts back up again immediately once she gets home.  She says that she suffered at home trying all the things that she knows to do such as taking a hot shower and screaming at home in her bed, but nothing helped so she came back in tonight after more than 12 hours of ongoing pain (she was discharged about 15 hours ago).     Physical Exam   Triage Vital Signs: ED Triage Vitals [02/23/24 2342]  Encounter Vitals Group     BP 115/64     Systolic BP Percentile      Diastolic BP Percentile      Pulse Rate (!) 120     Resp (!) 22     Temp 98.2 F (36.8 C)     Temp Source Oral     SpO2 99 %     Weight      Height 1.676 m (5\' 6" )     Head Circumference      Peak Flow      Pain Score 10     Pain Loc      Pain Education      Exclude from Growth Chart     Most recent vital signs: Vitals:   02/24/24 0330 02/24/24 0339  BP: (!) 159/85 (!) 159/85  Pulse: 84 86  Resp:  18  Temp:  98.3 F (36.8 C)  SpO2: 97% 97%    General: Awake, alert.  Yelling and screaming and moaning in her exam bed.  She is not  producing any tears.  When she is answering questions, she will stop and answer the questions coherently and in full sentences and then return to moaning and yelling. CV:  Good peripheral perfusion.  Regular rate and rhythm. Resp:  Normal effort. Speaking easily and comfortably, no accessory muscle usage nor intercostal retractions.   Abd:  Obese.  Abdomen is soft.  No obvious tenderness to palpation throughout the abdomen; she does not react any differently when I am palpating her abdomen then she does at baseline. Other:  Patient is very upset and anxious as described above.  She is quite confrontational; I spoke with her very calm only and explained that I needed her to try and stop yelling so that I could talk with her and this made her very angry.   ED Results / Procedures / Treatments   Labs (all labs ordered are listed, but only abnormal results are displayed) Labs Reviewed  CBC WITH DIFFERENTIAL/PLATELET - Abnormal; Notable for the following components:      Result Value  WBC 12.5 (*)    RBC 5.38 (*)    MCV 74.2 (*)    MCH 23.0 (*)    RDW 18.5 (*)    Monocytes Absolute 1.1 (*)    All other components within normal limits  COMPREHENSIVE METABOLIC PANEL WITH GFR - Abnormal; Notable for the following components:   Glucose, Bld 171 (*)    BUN 24 (*)    Calcium  8.3 (*)    All other components within normal limits  LIPASE, BLOOD  HCG, QUANTITATIVE, PREGNANCY     EKG  EKG performed within the last 24 hours shows no QTc prolongation   RADIOLOGY See ED course for details   PROCEDURES:  Critical Care performed: No  .1-3 Lead EKG Interpretation  Performed by: Lynnda Sas, MD Authorized by: Lynnda Sas, MD     Interpretation: normal     ECG rate:  95   ECG rate assessment: normal     Rhythm: sinus rhythm     Ectopy: none     Conduction: normal       IMPRESSION / MDM / ASSESSMENT AND PLAN / ED COURSE  I reviewed the triage vital signs and the nursing  notes.                              Differential diagnosis includes, but is not limited to, cannabinoid hyperemesis syndrome, other nonspecific cyclic vomiting syndrome, gastroparesis, SBO/ileus, secondary gain/drug-seeking behavior.  Patient's presentation is most consistent with acute presentation with potential threat to life or bodily function.  Labs/studies ordered: CMP, CBC with differential, lipase  Interventions/Medications given:  Medications  dicyclomine (BENTYL) capsule 20 mg (has no administration in time range)  droperidol  (INAPSINE ) 2.5 MG/ML injection 2.5 mg (2.5 mg Intravenous Given 02/24/24 0031)  diphenhydrAMINE  (BENADRYL ) injection 50 mg (50 mg Intravenous Given 02/24/24 0032)  iohexol  (OMNIPAQUE ) 350 MG/ML injection 100 mL (100 mLs Intravenous Contrast Given 02/24/24 0217)    (Note:  hospital course my include additional interventions and/or labs/studies not listed above.)   Patient's presentation is very consistent with cannabinoid hyperemesis syndrome though she very angrily denies that this.  There is no point in checking a urine drug screen because she admits to marijuana use.  She has had a thorough ED evaluation twice in the last 24 to 48 hours.  Her last CT scan was in February.  Her exam is generally reassuring with no change in expression or reaction to palpation of the abdomen which is soft and nondistended.  I find it unlikely she would benefit from a scan at this point but I will consider it once her lab work is back and after she has been medicated.  She received droperidol  which reportedly put her to sleep on her first recent visit.  However, on the most recent previous visit and on this visit, she says she did not want that medicine again because it made her feel jumpy.  Previously it was explained to her why we do not treat this sort of situation with opioids.  I told her I feel the droperidol  is the best choice for her but this time I would give her Benadryl   along with it to help decrease the probability of side effects and she was agreeable to this plan.  Given the amount of discomfort she is expressing and the degree of upset she is experiencing, I ordered droperidol  5 mg IV both to treat the cannabinoid hyperemesis syndrome and  act as a calm agent.  I also ordered diphenhydramine  50 mg IV to try and prevent extraparametal symptoms.  The patient is on the cardiac monitor to evaluate for evidence of arrhythmia and/or significant heart rate changes.   Clinical Course as of 02/24/24 0341  Mon Feb 24, 2024  0130 Reassuring lab work, minimal leukocytosis of 12.5 which is likely a stress reaction to her symptoms.  CMP is essentially normal. [CF]  0131 Given that this is her third visit in a short period of time, I will evaluate with a CT scan to rule out the possibility that she is suffering from radiographically demonstrated SBO, ileus, or other acute intra-abdominal emergency. [CF]  0202 HCG, Beta Chain, Quant, S: 2 negative HCG [CF]  0317 CT ABDOMEN PELVIS W CONTRAST I independently viewed and interpreted the patient's CT of the abdomen and pelvis and I can see no evidence of SBO or ileus.  Radiology confirmed no SBO or ileus.  They mention some nonemergent outpatient follow-up findings such as a stable hemangioma of the liver, but nothing to explain her current symptoms.  She is resting currently but I we will reassess her shortly. [CF]  [phone] Patient was resting comfortably and sleeping when I checked on her.  She says she feels much better but it is "not totally gone".  I reassured her that I would not expect her symptoms to go away completely but that fortunately she has no evidence of an emergent medical condition.  I updated her about the CT scan results.  She says that she has Reglan  and Phenergan  at home.  She has never tried Bentyl so I will first provide a dose in the ED as well as a prescription.  She said that she will follow-up with her GI  doctor in [place] on Tuesday.  The patient's medical screening exam is reassuring with no indication of an emergent medical condition requiring hospitalization or additional evaluation at this point.  The patient is safe and appropriate for discharge and outpatient follow up. [CF]    Clinical Course User Index [CF] Lynnda Sas, MD     FINAL CLINICAL IMPRESSION(S) / ED DIAGNOSES   Final diagnoses:  Cyclic vomiting syndrome     Rx / DC Orders   ED Discharge Orders          Ordered    dicyclomine (BENTYL) 10 MG capsule  3 times daily PRN        02/24/24 0341             Note:  This document was prepared using Dragon voice recognition software and may include unintentional dictation errors.   Lynnda Sas, MD 02/24/24 [phone]

## 2024-02-25 ENCOUNTER — Emergency Department
Admission: EM | Admit: 2024-02-25 | Discharge: 2024-02-25 | Disposition: A | Discharge status: Home / Self Care | Attending: Emergency Medicine | Admitting: Emergency Medicine

## 2024-02-25 ENCOUNTER — Other Ambulatory Visit: Payer: Self-pay

## 2024-02-25 DIAGNOSIS — R1084 Generalized abdominal pain: Secondary | ICD-10-CM | POA: Insufficient documentation

## 2024-02-25 DIAGNOSIS — E119 Type 2 diabetes mellitus without complications: Secondary | ICD-10-CM | POA: Insufficient documentation

## 2024-02-25 NOTE — Discharge Instructions (Signed)
 Follow-up with your PCP and GI specialist. Return to the ER for new or worsening symptoms.

## 2024-02-25 NOTE — ED Notes (Signed)
 ED provider at bedside.

## 2024-02-25 NOTE — ED Notes (Signed)
 Pt walked out during report handoff. Pt did not wait on discharge paperwork.

## 2024-02-25 NOTE — ED Triage Notes (Signed)
 Pt presents via POV c/o abd pain. Reports 4th time seen in ED in past 2 days for the same. Pt Reports hx of gastroparesis. Denies Vomiting. Reports no improvement in pain. Ambulatory to triage.

## 2024-02-25 NOTE — ED Provider Notes (Signed)
 [facility] Provider Note    Event Date/Time   First MD Initiated Contact with Patient 02/25/24 [phone]     (approximate)   History   Abdominal Pain   HPI  Karen Ruiz is a 43 year old female with history of T2DM, gastroparesis, polysubstance use including marijuana use presenting to the ER for evaluation of abdominal pain.  On review of her chart, she has had multiple visits for similar, including 3 visits in the last 4 days.  Presents today with what she reports is recurrent pain from her "gastroparesis". Reports generalized abdominal pain.  Reports she got temporary relief from medications in the ER, but reports she continues to have symptoms.  During her recent ER visit noted that she continues to use marijuana but does not think that her symptoms are related to the cannabinoid hyperemesis syndrome.  Denies any change in the character of her symptoms.  Multiple recent ER visits.  Overall reassuring labs, treated with combination of droperidol , Ativan , fluids, Bentyl, Benadryl  during this visit.  Had CT scan performed on 5/25 without acute findings.  Additionally reviewed discharge summary from 11/06/2023.  At that time patient presented with intractable vomiting, but left AMA shortly after admission.      Physical Exam   Triage Vital Signs: ED Triage Vitals [02/25/24 0640]  Encounter Vitals Group     BP (!) 144/105     Systolic BP Percentile      Diastolic BP Percentile      Pulse Rate 90     Resp 20     Temp 97.7 F (36.5 C)     Temp Source Oral     SpO2 97 %     Weight      Height      Head Circumference      Peak Flow      Pain Score 7     Pain Loc      Pain Education      Exclude from Growth Chart     Most recent vital signs: Vitals:   02/25/24 0640  BP: (!) 144/105  Pulse: 90  Resp: 20  Temp: 97.7 F (36.5 C)  SpO2: 97%     General: Awake, interactive  CV:  Regular rate, good peripheral perfusion.  Resp:  Unlabored  respirations.  Abd:  Nondistended, soft, nontender to palpation diffusely Neuro:  Symmetric facial movement, fluid speech   ED Results / Procedures / Treatments   Labs (all labs ordered are listed, but only abnormal results are displayed) Labs Reviewed - No data to display   EKG EKG independently reviewed interpreted by myself (ER attending) demonstrates:    RADIOLOGY Imaging independently reviewed and interpreted by myself demonstrates:   Formal Radiology Read:  No results found.  PROCEDURES:  Critical Care performed: No  Procedures   MEDICATIONS ORDERED IN ED: Medications - No data to display   IMPRESSION / MDM / ASSESSMENT AND PLAN / ED COURSE  I reviewed the triage vital signs and the nursing notes.  Differential diagnosis includes, but is not limited to, recurrent abdominal pain secondary to gastroparesis, cannabinoid hyperemesis syndrome, other chronic abdominal pain, overall very low suspicion for acute process given multiple reassuring negative workups including both lab work, imaging, as well as reassuring abdominal exam  Patient's presentation is most consistent with acute illness / injury with system symptoms.  43 year old female presenting with ongoing abdominal pain, multiple recent ER visits for the same.  Stable vitals on presentation.  Reassuring abdominal exam.  I discussed overall low suspicion for emergent process, but did discuss further attempts at symptom control.  I reinforced that I was not comfortable providing narcotic pain medication for her symptoms and did think that this may make it worse given her history of gastroparesis.  She informed me that she did not wish to have any further testing or trial alternative nonnarcotic medications.  I encouraged her to continue using her home nausea medication and trialed the Bentyl prescription that was sent for her during her recent visit.  She was discharged in stable condition.     FINAL CLINICAL  IMPRESSION(S) / ED DIAGNOSES   Final diagnoses:  Generalized abdominal pain     Rx / DC Orders   ED Discharge Orders     None        Note:  This document was prepared using Dragon voice recognition software and may include unintentional dictation errors.   Claria Crofts, MD 02/25/24 [phone]

## 2024-02-26 ENCOUNTER — Other Ambulatory Visit: Payer: Self-pay

## 2024-02-26 ENCOUNTER — Emergency Department
Admission: EM | Admit: 2024-02-26 | Discharge: 2024-02-26 | Disposition: A | Discharge status: Home / Self Care | Attending: Emergency Medicine | Admitting: Emergency Medicine

## 2024-02-26 ENCOUNTER — Emergency Department

## 2024-02-26 DIAGNOSIS — R109 Unspecified abdominal pain: Secondary | ICD-10-CM | POA: Diagnosis present

## 2024-02-26 DIAGNOSIS — I2510 Atherosclerotic heart disease of native coronary artery without angina pectoris: Secondary | ICD-10-CM | POA: Diagnosis not present

## 2024-02-26 DIAGNOSIS — F1290 Cannabis use, unspecified, uncomplicated: Secondary | ICD-10-CM

## 2024-02-26 DIAGNOSIS — K3184 Gastroparesis: Secondary | ICD-10-CM

## 2024-02-26 DIAGNOSIS — R1110 Vomiting, unspecified: Secondary | ICD-10-CM

## 2024-02-26 DIAGNOSIS — E1143 Type 2 diabetes mellitus with diabetic autonomic (poly)neuropathy: Secondary | ICD-10-CM | POA: Insufficient documentation

## 2024-02-26 LAB — TROPONIN I (HIGH SENSITIVITY)
Troponin I (High Sensitivity): 10 ng/L (ref <18)
Troponin I (High Sensitivity): 8 ng/L (ref <18)

## 2024-02-26 LAB — CBC
HCT: 44.6 % (ref 36.0–46.0)
Hemoglobin: 13.6 g/dL (ref 12.0–15.0)
MCH: 23.4 pg — ABNORMAL LOW (ref 26.0–34.0)
MCHC: 30.5 g/dL (ref 30.0–36.0)
MCV: 76.6 fL — ABNORMAL LOW (ref 80.0–100.0)
Platelets: 349 10*3/uL (ref 150–400)
RBC: 5.82 MIL/uL — ABNORMAL HIGH (ref 3.87–5.11)
RDW: 18.7 % — ABNORMAL HIGH (ref 11.5–15.5)
WBC: 10.8 10*3/uL — ABNORMAL HIGH (ref 4.0–10.5)
nRBC: 0 % (ref 0.0–0.2)

## 2024-02-26 LAB — BASIC METABOLIC PANEL WITH GFR
Anion gap: 14 (ref 5–15)
BUN: 17 mg/dL (ref 6–20)
CO2: 20 mmol/L — ABNORMAL LOW (ref 22–32)
Calcium: 9.2 mg/dL (ref 8.9–10.3)
Chloride: 105 mmol/L (ref 98–111)
Creatinine, Ser: 0.56 mg/dL (ref 0.44–1.00)
GFR, Estimated: >60 mL/min (ref >60)
Glucose, Bld: 172 mg/dL — ABNORMAL HIGH (ref 70–99)
Potassium: 3.5 mmol/L (ref 3.5–5.1)
Sodium: 139 mmol/L (ref 135–145)

## 2024-02-26 MED ORDER — DROPERIDOL 2.5 MG/ML IJ SOLN
1.2500 mg | Freq: Once | INTRAMUSCULAR | Status: AC
  Administered 2024-02-26: 1.25 mg via INTRAVENOUS
  Filled 2024-02-26: qty 2

## 2024-02-26 MED ORDER — DICYCLOMINE HCL 10 MG/ML IM SOLN
20.0000 mg | Freq: Once | INTRAMUSCULAR | Status: AC
  Administered 2024-02-26: 20 mg via INTRAMUSCULAR
  Filled 2024-02-26: qty 2

## 2024-02-26 MED ORDER — ONDANSETRON 4 MG PO TBDP
4.0000 mg | ORAL_TABLET | Freq: Once | ORAL | Status: AC
  Administered 2024-02-26: 4 mg via ORAL
  Filled 2024-02-26: qty 1

## 2024-02-26 MED ORDER — DIPHENHYDRAMINE HCL 50 MG/ML IJ SOLN
12.5000 mg | Freq: Once | INTRAMUSCULAR | Status: AC
  Administered 2024-02-26: 12.5 mg via INTRAVENOUS
  Filled 2024-02-26: qty 1

## 2024-02-26 NOTE — ED Provider Notes (Signed)
 [facility] Provider Note    Event Date/Time   First MD Initiated Contact with Patient 02/26/24 1409     (approximate)   History   Abdominal pain   HPI  Karen Ruiz is a 43 y.o. female with a history of CAD, diabetes, gastroparesis who presents with complaints of abdominal pain.  History is limited as the patient is yelling at staff, refused to get off of the floor in the triage area.  Notably this is her fifth visit in the last 4 days per review of records.  She has a history of aggressive behavior with ED staff and is quite confrontational.  She has refused lab work, EKG     Physical Exam   Triage Vital Signs: ED Triage Vitals  Encounter Vitals Group     BP 02/26/24 1348 (!) 154/104     Systolic BP Percentile --      Diastolic BP Percentile --      Pulse Rate 02/26/24 1348 86     Resp 02/26/24 1348 20     Temp 02/26/24 1348 98.3 F (36.8 C)     Temp Source 02/26/24 1348 Oral     SpO2 02/26/24 1348 98 %     Weight --      Height --      Head Circumference --      Peak Flow --      Pain Score 02/26/24 1349 10     Pain Loc --      Pain Education --      Exclude from Growth Chart --     Most recent vital signs: Vitals:   02/26/24 1348  BP: (!) 154/104  Pulse: 86  Resp: 20  Temp: 98.3 F (36.8 C)  SpO2: 98%     General: Awake, anxious, angry CV:  Good peripheral perfusion.  Resp:  Normal effort.  Abd:  No distention.  Soft, nontender, very reassuring exam Other:     ED Results / Procedures / Treatments   Labs (all labs ordered are listed, but only abnormal results are displayed) Labs Reviewed  BASIC METABOLIC PANEL WITH GFR  CBC  POC URINE PREG, ED  TROPONIN I (HIGH SENSITIVITY)     EKG  Unable to obtain   RADIOLOGY     PROCEDURES:  Critical Care performed:   Procedures   MEDICATIONS ORDERED IN ED: Medications  dicyclomine (BENTYL) injection 20 mg (20 mg Intramuscular Given 02/26/24 1459)   ondansetron  (ZOFRAN -ODT) disintegrating tablet 4 mg (4 mg Oral Given 02/26/24 1500)     IMPRESSION / MDM / ASSESSMENT AND PLAN / ED COURSE  I reviewed the triage vital signs and the nursing notes. Patient's presentation is most consistent with exacerbation of chronic illness.  Patient reports she is having a gastroparesis flare, she states she does not need any work up and she knows what it is.  Feels the same as it always does she says.  She is demanding medications.  I discussed with her that narcotics are not appropriate for gastroparesis.  Will give IM Bentyl, ODT Zofran .  I reviewed her labs from 2 days ago which were reassuring at that time however lipase was normal.  On May 26, 2 days ago she had a CT scan for the same symptoms that she is having now which did not demonstrate any acute abnormalities.  I have informed her that we will not tolerate threatening behavior towards EDPs or staff  Patient is now agreeable towards  having an IV, discussed with her treatment with droperidol  which has worked for her in the past and is a nonnarcotic therapy.  She has agreed, will place the patient on a cardiac monitor, she has normal QT  I have asked my colleague to reevaluate the patient after treatment      FINAL CLINICAL IMPRESSION(S) / ED DIAGNOSES   Final diagnoses:  Gastroparesis     Rx / DC Orders   ED Discharge Orders     None        Note:  This document was prepared using Dragon voice recognition software and may include unintentional dictation errors.   Bryson Carbine, MD 02/26/24 [phone]

## 2024-02-26 NOTE — Discharge Instructions (Signed)
 You are seen in the emergency department for nausea and vomiting.  You have had multiple ED visits recently for similar symptoms.  Your lab work was overall at your normal.  No concern for heart attack and your troponin was normal.  Follow-up with your primary care physician and with your gastroenterologist.  Your marijuana use is likely contributing to your gastroparesis, I would encourage you to stop smoking or consuming any marijuana or synthetic marijuana for at least 3 months and see if this improves your symptoms. Return for worsening symptoms.

## 2024-02-26 NOTE — ED Provider Notes (Signed)
 Care assumed of patient from outgoing provider.  See their note for initial history, exam and plan.  Clinical Course as of 02/26/24 1748  Wed Feb 26, 2024  1512 CT scan recently w/o acute findings. H/o hyperemesis.  Given IVF and droperidol .  Plan to dc home after symptomatic treatment [SM]  1747 Laboratory during.  On reevaluation patient states she is feeling better.  No further episodes of vomiting in the emergency department.  Discussed at length following up with her primary care physician and gastroenterologist.  Encouraged to stop smoking marijuana as it is likely contributing to her symptoms.  Patient was adamant that marijuana is not contributing to her symptoms.  Discussed return for any worsening symptoms. [SM]    Clinical Course User Index [SM] Viviano Ground, MD     Viviano Ground, MD 02/26/24 [phone]

## 2024-02-26 NOTE — ED Notes (Signed)
 Patient got up from wheelchair and laid herself in floor. Patient sat up when this RN approached and moaning and groaning loudly, moving her body back and forth in rocking motion. Patient made aware she has room. Patient got up from floor and ambulating with no issues through lobby to room while groaning loudly.

## 2024-02-26 NOTE — ED Triage Notes (Addendum)
 Pt to ED via [facility] from home. Pt reports epigastric pain and upper abd pain. Pt reports hx of gastroparesis. Pt seen on the 27th and multiple times prior to that visit for same. Pt reports marijuana use. Pt states pain has gotten worse since yesterday.   Pt rocking back and forth and yelling continuously in triage. Pt will not sit still for EKG or blood draw  324mg  ASA PTA  EMS VS 162/90 98% RA HR 95 CBG 148

## 2024-02-26 NOTE — ED Notes (Signed)
 Lab attempting to collect blood from patient

## 2024-02-26 NOTE — ED Notes (Signed)
 Patient refused xray per [place], Donnia Galas. Patient also refused to let tech hook her up to monitor

## 2024-03-23 ENCOUNTER — Emergency Department
Admission: EM | Admit: 2024-03-23 | Discharge: 2024-03-23 | Disposition: A | Discharge status: Home / Self Care | Attending: Emergency Medicine | Admitting: Emergency Medicine

## 2024-03-23 ENCOUNTER — Other Ambulatory Visit: Payer: Self-pay

## 2024-03-23 DIAGNOSIS — F12188 Cannabis abuse with other cannabis-induced disorder: Secondary | ICD-10-CM | POA: Diagnosis not present

## 2024-03-23 DIAGNOSIS — D72829 Elevated white blood cell count, unspecified: Secondary | ICD-10-CM | POA: Diagnosis not present

## 2024-03-23 DIAGNOSIS — K3184 Gastroparesis: Secondary | ICD-10-CM | POA: Diagnosis not present

## 2024-03-23 DIAGNOSIS — R112 Nausea with vomiting, unspecified: Secondary | ICD-10-CM | POA: Diagnosis present

## 2024-03-23 DIAGNOSIS — R1013 Epigastric pain: Secondary | ICD-10-CM

## 2024-03-23 LAB — CBC
HCT: 42.8 % (ref 36.0–46.0)
Hemoglobin: 13.5 g/dL (ref 12.0–15.0)
MCH: 23.6 pg — ABNORMAL LOW (ref 26.0–34.0)
MCHC: 31.5 g/dL (ref 30.0–36.0)
MCV: 75 fL — ABNORMAL LOW (ref 80.0–100.0)
Platelets: 401 10*3/uL — ABNORMAL HIGH (ref 150–400)
RBC: 5.71 MIL/uL — ABNORMAL HIGH (ref 3.87–5.11)
RDW: 20.3 % — ABNORMAL HIGH (ref 11.5–15.5)
WBC: 15.6 10*3/uL — ABNORMAL HIGH (ref 4.0–10.5)
nRBC: 0 % (ref 0.0–0.2)

## 2024-03-23 LAB — COMPREHENSIVE METABOLIC PANEL WITH GFR
ALT: 18 U/L (ref 0–44)
AST: 22 U/L (ref 15–41)
Albumin: 4.1 g/dL (ref 3.5–5.0)
Alkaline Phosphatase: 86 U/L (ref 38–126)
Anion gap: 12 (ref 5–15)
BUN: 17 mg/dL (ref 6–20)
CO2: 21 mmol/L — ABNORMAL LOW (ref 22–32)
Calcium: 9.4 mg/dL (ref 8.9–10.3)
Chloride: 109 mmol/L (ref 98–111)
Creatinine, Ser: 0.58 mg/dL (ref 0.44–1.00)
GFR, Estimated: >60 mL/min (ref >60)
Glucose, Bld: 151 mg/dL — ABNORMAL HIGH (ref 70–99)
Potassium: 3.8 mmol/L (ref 3.5–5.1)
Sodium: 142 mmol/L (ref 135–145)
Total Bilirubin: 0.7 mg/dL (ref 0.0–1.2)
Total Protein: 8 g/dL (ref 6.5–8.1)

## 2024-03-23 LAB — LIPASE, BLOOD: Lipase: 22 U/L (ref 11–51)

## 2024-03-23 MED ORDER — DROPERIDOL 2.5 MG/ML IJ SOLN
2.5000 mg | Freq: Once | INTRAMUSCULAR | Status: AC
  Administered 2024-03-23: 2.5 mg via INTRAMUSCULAR
  Filled 2024-03-23: qty 2

## 2024-03-23 NOTE — Discharge Instructions (Signed)
 You are seen in the emergency department for upper abdominal pain with significant nausea and vomiting.  You have a prior diagnosis of gastroparesis.  Concerned that the marijuana you are consuming is also causing worsening symptoms of gastroparesis and hyperemesis syndrome.  You are given droperidol  in the emergency department which improved your symptoms.  It is importantly stop consuming any marijuana.  Follow-up closely with your primary care physician and gastroenterologist.  Return to the emergency department for any worsening symptoms.

## 2024-03-23 NOTE — ED Notes (Signed)
 Provided with a cup of apple juice and a couple of saltine crackers for po challenge. Will continue to monitor

## 2024-03-23 NOTE — ED Notes (Signed)
 Pt verbalizes understanding of discharge instructions.

## 2024-03-23 NOTE — ED Triage Notes (Addendum)
 Pt sts that she has been having abd pain with N/V. Pt sts that she smoked marijuana yesterday but she says that has nothing to do with it. Pt sts that she has a hx of gastroparesis. Pt is not willing to talk to staff while in triage. Pt is moaning while talking very quietly. RN had to ask pt to speak up in order to hear pt.

## 2024-03-23 NOTE — ED Notes (Addendum)
 Attempted to get an EKG on the pt. Pt stated to give her time that she can't sit still. Dr Suzanne said we can try again later for the EKG.

## 2024-03-23 NOTE — ED Notes (Signed)
 Pt calling her ride ready to be discharge able to keep down some of apple juice she drank, no vomiting

## 2024-03-23 NOTE — ED Notes (Signed)
 This RN asked pt for a urine sample and pt states she cannot give one at this moment

## 2024-03-23 NOTE — ED Provider Notes (Addendum)
 [facility] Provider Note    Event Date/Time   First MD Initiated Contact with Patient 03/23/24 1801     (approximate)   History   Abdominal Pain   HPI  Karen Ruiz is a 43 y.o. female presents to the emergency department with abdominal pain.  History of gastroparesis and cyclical vomiting syndrome, presents to the emergency department nausea and vomiting.  States that she has not been feeling well since yesterday.  States that this is from her gastroparesis.  Last smoked marijuana yesterday.  Endorses nausea vomiting and severe upper abdominal pain.  Asking for an IM injection instead of an IV injection.      Physical Exam   Triage Vital Signs: ED Triage Vitals  Encounter Vitals Group     BP 03/23/24 1620 (!) 169/106     Girls Systolic BP Percentile --      Girls Diastolic BP Percentile --      Boys Systolic BP Percentile --      Boys Diastolic BP Percentile --      Pulse Rate 03/23/24 1620 75     Resp 03/23/24 1620 17     Temp 03/23/24 1620 97.8 F (36.6 C)     Temp Source 03/23/24 1620 Oral     SpO2 03/23/24 1620 99 %     Weight 03/23/24 1617 140 lb (63.5 kg)     Height 03/23/24 1617 5' 6 (1.676 m)     Head Circumference --      Peak Flow --      Pain Score 03/23/24 1617 8     Pain Loc --      Pain Education --      Exclude from Growth Chart --     Most recent vital signs: Vitals:   03/23/24 1620 03/23/24 1919  BP: (!) 169/106 (!) 163/86  Pulse: 75 97  Resp: 17 16  Temp: 97.8 F (36.6 C) 98.1 F (36.7 C)  SpO2: 99% 97%    Physical Exam Constitutional:      General: She is in acute distress.     Appearance: She is well-developed.     Comments: Laying on her abdomen, rolling around the bed  HENT:     Head: Atraumatic.   Eyes:     Conjunctiva/sclera: Conjunctivae normal.    Cardiovascular:     Rate and Rhythm: Regular rhythm.  Pulmonary:     Effort: No respiratory distress.  Abdominal:     General: There is  no distension.   Musculoskeletal:        General: Normal range of motion.     Cervical back: Normal range of motion.   Skin:    General: Skin is warm.   Neurological:     Mental Status: She is alert. Mental status is at baseline.     IMPRESSION / MDM / ASSESSMENT AND PLAN / ED COURSE  I reviewed the triage vital signs and the nursing notes.  On chart review patient has been seen multiple times in the ER over the past 1 month for cyclical vomiting syndrome and gastroparesis.  Continues to have marijuana use.  Differential diagnosis including acute gastritis/PUD, gastroparesis flare, cyclical vomiting syndrome, pancreatitis.  Have low suspicion for ACS.  No significant lower abdominal pain, have low suspicion for acute appendicitis or other acute intra-abdominal pathology.  On chart review prior EKG with no prolonged QTc.  Will give IM droperidol  and repeat EKG.  EKG  I, Clotilda Punter,  the attending physician, personally viewed and interpreted this ECG.   Rate: Normal  Rhythm: Normal sinus  Intervals: Normal  ST&T Change: None  No tachycardic or bradycardic dysrhythmias while on cardiac telemetry. LABS (all labs ordered are listed, but only abnormal results are displayed) Labs interpreted as -    Labs Reviewed  COMPREHENSIVE METABOLIC PANEL WITH GFR - Abnormal; Notable for the following components:      Result Value   CO2 21 (*)    Glucose, Bld 151 (*)    All other components within normal limits  CBC - Abnormal; Notable for the following components:   WBC 15.6 (*)    RBC 5.71 (*)    MCV 75.0 (*)    MCH 23.6 (*)    RDW 20.3 (*)    Platelets 401 (*)    All other components within normal limits  LIPASE, BLOOD     MDM  Presents to the emergency department with significant upper abdominal pain with nausea and vomiting.  History of gastroparesis and cyclical vomiting syndrome.  On arrival patient in obvious distress.  Asking for IM medications instead of IV.   Leukocytosis likely in the setting of significant vomiting.  Chart review history of normal QTc so we will give IM droperidol   IM droperidol  given x 2  Repeat EKG with no significant ST elevation or depression.  QTc 482.  On reevaluation feeling better, no longer rolling around the bed, states she is feeling much better.  Will do a trial of p.o. challenge.  Patient took a few sips of water with no further episodes of vomiting.  Refuses any food at this time. discussed at length marijuana cessation.  Discussed follow-up as an outpatient with primary care provider.  Given return precautions.  No questions at time of discharge.     PROCEDURES:  Critical Care performed: No  Procedures  Patient's presentation is most consistent with acute presentation with potential threat to life or bodily function.   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   Suzanne Kirsch, MD 03/23/24 2014    Suzanne Kirsch, MD 03/23/24 2053

## 2024-05-13 ENCOUNTER — Emergency Department
Admission: EM | Admit: 2024-05-13 | Discharge: 2024-05-13 | Discharge status: Against Medical Advice | Attending: Emergency Medicine | Admitting: Emergency Medicine

## 2024-05-13 ENCOUNTER — Other Ambulatory Visit: Payer: Self-pay

## 2024-05-13 DIAGNOSIS — Z5321 Procedure and treatment not carried out due to patient leaving prior to being seen by health care provider: Secondary | ICD-10-CM | POA: Insufficient documentation

## 2024-05-13 DIAGNOSIS — R109 Unspecified abdominal pain: Secondary | ICD-10-CM | POA: Diagnosis present

## 2024-05-13 DIAGNOSIS — R112 Nausea with vomiting, unspecified: Secondary | ICD-10-CM | POA: Insufficient documentation

## 2024-05-13 NOTE — ED Provider Notes (Signed)
 ED Progress Note  6:50 AM I was called to the bedside as patient went to the bathroom, and returned and was completely asymptomatic prior to her trip to the bathroom and was feeling improved and nurse removed IV.  Upon return to the room she started crying, the nurse noted that she was crying, and reassessed her.  She states that her symptoms have returned and she is crying and writhing in the bed.  This will obtain EKG to evaluate her QTc and provide her with a dose of IM Haldol  if this is normal.  If she continues to have symptoms patient will need reassessment for symptomatic control and possible admission for gastroparesis exacerbation and/or cannabinoid hyperemesis syndrome.  Patient will be signed out at 7 AM pending reassessment.

## 2024-05-13 NOTE — ED Triage Notes (Signed)
 Pt reports hx gastroparesis, states she has been having this flare up for the past few days, pt reports abd pain and n/v.

## 2024-05-13 NOTE — ED Provider Notes (Signed)
 [facility] Emergency Department Provider Note    ED Clinical Impression   Final diagnoses:  Abdominal pain, unspecified abdominal location (Primary)     Impression, ED Course, Assessment and Plan   Impression:  Patient is a 43 y.o. female with a past medical history of T2DM, STEMI (02/20/23), CAD s/p stent placement, CHF, HTN, HLD, gastroparesis, Barrett's esophagus, and liver hemangioma who presents for a few days of intermittent diffuse abdominal pain with emesis post-prandially consistent with previous gastroparesis flares.  No chest pain or shortness of breath.  On exam, the patient is writhing uncomfortably. Vital signs are unremarkable and within normal limits. Patient is afebrile and hemodynamically stable. Physical exam notable for soft abdomen, no focal abdominal tenderness palpation, no rigidity.  Differential includes gastroparesis given patient states this feels consistent from prior gastroparesis flares, cannabinoid hyperemesis syndrome, low suspicion for acute surgical abdomen such as perforated viscus, obstruction, ileus, appendicitis, given no focal abdominal tenderness to palpation, no peritonitis on exam, also less likely diverticulitis, colitis, given no lower abdominal tenderness palpation.  No concern for acute OB/GYN etiology such as torsion.  Plan for lipase, UA w/ reflex, CBC, and CT A/P. Will give benadryl , Toradol , haldol , and IV fluids  ED Course  4:00 AM Labs with a leukocytosis to 18, question stress reaction, chemistry panel with elevated anion gap 18 likely in the setting of volume depletion, otherwise no evidence of DKA, normal sugar at 158, normal electrolytes, no evidence of acidosis with a bicarb of 22.5.  Normal LFTs, negative lipase of 27.  Negative pregnancy test.  6:20 AM CT scan as below without evidence of acute intra-abdominal findings.  Upon reassessment she is feeling significantly improved.  Her repeat abdominal exam  remains benign without any focal abdominal tenderness to palpation.  Will plan for p.o. challenge, if goes well patient will be discharged with PCP follow-up within the next 2 to 3 days.  Patient amenable to this plan.  Given she was provided sedative medications here in the emergency department for symptomatic control, she will not be driving and her husband will be driving who is accompanying her to the bedside.  I discussed my evaluation of the patient's symptoms, my clinical impression, and my proposed outpatient treatment plan. We have discussed anticipatory guidance, scheduled follow-up, and careful return precautions. I provided an opportunity and answered any questions the patient had. The patient express understanding and are comfortable with the discharge plan.   Additional Medical Decision Making    Discussion of Management with other Physicians, QHP or Appropriate Source: N/A Independent Interpretation of Studies:    RADIOLOGY: as below    Labs as above External Records Reviewed: Patient's most recent outside Emergency Department visit Escalation of Care, Consideration of Admission/Observation/Transfer:  N/A Social determinants that significantly affected care: None applicable Prescription drug(s) considered but not prescribed:  Diagnostic tests considered but not performed:  History obtained from other sources: None  Labs and radiology results that were available during my care of the patient were independently reviewed by me and considered in my medical decision making.  Portions of this record have been created using Scientist, clinical (histocompatibility and immunogenetics). Dictation errors have been sought, but may not have been identified and corrected. ____________________________________________  Time seen: May 13, 2024 3:57 AM   History   Chief Complaint Abdominal Pain   HPI  Karen Ruiz is a 43 y.o. female with a past medical history of T2DM, STEMI (02/20/23), CAD s/p stent placement,  CHF, HTN, HLD, gastroparesis, Barrett's  esophagus, and liver hemangioma who presents for abdominal pain. The patient reports a few days of colicky diffuse nonlocalized abdominal pain with more than 4-5 episodes of nonbloody, nonbilious post-prandially emesis which she states is consistent with her typical gastroparesis.  She states that the pain is sharp, without aggravating or relieving factors.  She has not taken any medications at home for her symptoms.  The patient smokes marijuana with last use today.  Patient denies a history of cannabis hyperemesis.  Denies any other illicit drugs or EtOH use. Past surgical history of c-section.  Patient denies any chest pain, shortness of breath, diaphoresis, jaw pain, arm pain.  Denies diarrhea, constipation, obstipation, hematemesis, hematochezia, or melena.  Past Medical History[1]  Past Surgical History[2]  No current facility-administered medications for this encounter.  Current Outpatient Medications:  .  acetaminophen  (TYLENOL ) 325 MG tablet, Take 2 tablets (650 mg total) by mouth Every six (6) hours. (Patient not taking: Reported on 02/11/2024), Disp: 30 tablet, Rfl: 0 .  aspirin  81 MG chewable tablet, Chew 1 tablet (81 mg total) daily., Disp: 90 tablet, Rfl: 3 .  atorvastatin  (LIPITOR ) 80 MG tablet, Take 1 tablet by mouth once daily, Disp: 30 tablet, Rfl: 11 .  blood sugar diagnostic (ACCU-CHEK GUIDE TEST STRIPS) Strp, Test daily before all meals/snacks and once before bedtime., Disp: 300 each, Rfl: 1 .  blood-glucose meter kit, Test daily before all meals/snacks and once before bedtime., Disp: 1 each, Rfl: 0 .  blood-glucose sensor (DEXCOM G7 SENSOR) Devi, Dispense DexCom G7 sensors; Use 1 sensor q 10 days, Disp: 9 each, Rfl: 3 .  carvedilol (COREG) 6.25 MG tablet, Take 1 tablet (6.25 mg total) by mouth two (2) times a day., Disp: 180 tablet, Rfl: 3 .  empagliflozin (JARDIANCE) 25 mg tablet, Take 1 tablet (25 mg total) by mouth every morning., Disp:  90 tablet, Rfl: 3 .  escitalopram  oxalate (LEXAPRO ) 20 MG tablet, Take 1 tablet (20 mg total) by mouth daily., Disp: 90 tablet, Rfl: 2 .  evolocumab  140 mg/mL PnIj, Inject 140 mg under the skin every fourteen (14) days., Disp: 6 mL, Rfl: 3 .  ezetimibe  (ZETIA ) 10 mg tablet, TAKE 1 TABLET(10 MG) BY MOUTH EVERY NIGHT, Disp: 90 tablet, Rfl: 1 .  furosemide (LASIX) 40 MG tablet, Take 1 tablet (40 mg total) by mouth daily as needed for swelling., Disp: 30 tablet, Rfl: 6 .  glucagon spray 3 mg/actuation Spry, Use 1 spray in 1 nostril for severe hypoglycemia, as per package instructions, Disp: 1 each, Rfl: 2 .  insulin  glargine (LANTUS  SOLOSTAR U-100 INSULIN ) 100 unit/mL (3 mL) injection pen, Use up to 50 units per day as per MD instructions, Disp: 45 mL, Rfl: 12 .  lancets (ACCU-CHEK SOFTCLIX LANCETS) Misc, Test daily before all meals/snacks and once before bedtime., Disp: 300 each, Rfl: 1 .  losartan  (COZAAR ) 50 MG tablet, Take 1 tablet (50 mg total) by mouth daily., Disp: 90 tablet, Rfl: 3 .  metoclopramide  (REGLAN ) 10 MG tablet, Take 1 tablet (10 mg total) by mouth Three (3) times a day before meals., Disp: 270 tablet, Rfl: 1 .  nitroglycerin  (NITROSTAT ) 0.4 MG SL tablet, Place 1 tablet (0.4 mg total) under the tongue every five (5) minutes as needed for chest pain. Maximum of 3 doses in 15 minutes., Disp: 25 tablet, Rfl: 1 .  pen needle, diabetic 32 gauge x 5/32 (4 mm) Ndle, Use with insulin  up to 4 times/day as needed., Disp: 100 each, Rfl: 0 .  semaglutide (OZEMPIC) 0.25 mg or 0.5 mg (2 mg/3 mL) PnIj, Start 0.25 mg/week and after 4 weeks increase to 0.5 mg/week as per prescriber's instructions, Disp: 9 mL, Rfl: 3 .  ticagrelor  (BRILINTA ) 90 mg Tab, Take 1 tablet (90 mg total) by mouth two (2) times a day., Disp: 180 tablet, Rfl: 3  Allergies Quinolones  Family History[3]  Social History Short Social History[4]     Physical Exam   VITAL SIGNS:   ED Triage Vitals  Enc Vitals Group     BP  05/13/24 0326 145/101     Pulse 05/13/24 0326 78     SpO2 Pulse --      Resp 05/13/24 0326 16     Temp 05/13/24 0326 37.4 C (99.4 F)     Temp Source 05/13/24 0326 Temporal     SpO2 05/13/24 0326 97 %     Weight 05/13/24 0321 90.1 kg (198 lb 10.2 oz)   Constitutional: Alert and oriented. Writhing uncomfortably. Eyes: Conjunctivae are normal. ENT      Head: Normocephalic and atraumatic.      Nose: No congestion.      Mouth/Throat: Mucous membranes are moist.      Neck: No stridor. Cardiovascular: Normal rate, regular rhythm.  Respiratory: Normal respiratory effort. Breath sounds are normal. Gastrointestinal: Soft and nontender, non-distended, no rebound or guarding, no rigidity Musculoskeletal: No lower extremity tenderness or edema. Neurologic: Normal speech and language. No gross focal neurologic deficits are appreciated. Skin: Skin is warm, dry and intact. No rash noted. Psychiatric: Mood and affect are normal. Speech and behavior are normal.   Radiology   CT Abdomen Pelvis W IV Contrast Only  Preliminary Result  -No acute abdominal or pelvic pathology.        I independently visualized these images.   Procedures    Pertinent labs & imaging results that were available during my care of the patient were reviewed by me and considered in my medical decision making (see chart for details).  Documentation assistance was provided by Abigail Gillikin, Scribe on May 13, 2024 at 3:58 AM for Toribio Desanctis, MD.  Documentation assistance was provided by the scribe in my presence.  The documentation recorded by the scribe has been reviewed by me and accurately reflects the services I personally performed.         [1] Past Medical History: Diagnosis Date  . Barrett's esophagus   . Barrett's esophagus   . 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 Gestational diabetes (HHS-HCC)   . Hypertension   . Migraines   [2] Past Surgical  History: Procedure Laterality Date  . BTL    . CESAREAN SECTION    . PR CATH PLACE/CORON ANGIO, IMG SUPER/INTERP,W LEFT HEART VENTRICULOGRAPHY N/A 02/20/2023   Procedure: Left Heart Catheterization;  Surgeon: Gil Franky Barter, MD;  Location: [facility] CATH;  Service: Cardiology  . PR CATH PLACE/CORON ANGIO, IMG SUPER/INTERP,W LEFT HEART VENTRICULOGRAPHY N/A 03/18/2023   Procedure: Left Heart Catheterization;  Surgeon: Gil Franky Barter, MD;  Location: [facility] CATH;  Service: Cardiology  . PR CATH PLACE/CORON ANGIO, IMG SUPER/INTERP,W LEFT HEART VENTRICULOGRAPHY N/A 05/08/2023   Procedure: Left Heart Catheterization With Intervention;  Surgeon: Barnet Donnice Righter, MD;  Location: [facility] CATH;  Service: Cardiology  . PR I&D OF VULVA/PERINEUM ABSCESS N/A 09/05/2016   Procedure: INCISION AND DRAINAGE OF VULVA OR PERINEAL ABSCESS;  Surgeon: Gloriann Mannie Lowry Graydon, MD;  Location: [facility];  Service: Advanced  Laparoscopy  [3] Family History Problem Relation Age of Onset  . Diabetes Mother   . Coronary artery disease Mother   . Breast cancer Mother        Lettie 2 positive? patient tested negative  . Heart disease Mother   . Hypertension Mother   . Diabetes Father   . Coronary artery disease Father   . Heart disease Father   . Hypertension Father   . Breast cancer Maternal Aunt   . Breast cancer Maternal Aunt   [4] Social History Tobacco Use  . Smoking status: Every Day    Current packs/day: 0.15    Types: Cigarettes  . Smokeless tobacco: Never  . Tobacco comments:    3cpd  Vaping Use  . Vaping status: Never Used  Substance Use Topics  . Alcohol use: Yes    Comment: one to two drinks per month maybe   . Drug use: Yes    Types: Marijuana    Comment: Daily per pt   Migliaccio, Daniel James, MD 05/13/24 [phone]

## 2024-05-13 NOTE — ED Provider Notes (Signed)
 [name] Emergency Department Progress Note  May 13, 2024 6:55 AM  ED care from Dr. Toribio Desanctis at 7am. Karen Ruiz is a 43 y.o. female with a PMH of T2DM, STEMI (02/20/23), CAD s/p stent placement, CHF, HTN, HLD, gastroparesis, Barrett's esophagus, and liver hemangioma who presents for a few days of colicky diffuse nonlocalized abdominal pain with more than 4-5 episodes of nonbloody, nonbilious post-prandially emesis which she states is consistent with her typical gastroparesis.   Medical Decision Making and Progress Notes   Physical exam from previous provider, the patient is writhing uncomfortably. Soft abdomen, no focal abdominal tenderness palpation, no rigidity.   On workup thus far, labs with a leukocytosis to 18, CMP with elevated anion gap 18.   On imaging thus far, CT A/P showed no acute abdominal or pelvic pathology.   Awaiting EKG, UA, and PO challenge. Anticipate discharge.    820  EKG shows borderline prolonged QT. Patient stable, feels much better, would like to go home. Abd soft and nontender. Tolerating clears. Results reviewed and showing leukocytosis which could be related to vomiting. Also has gall stones so possibly biliary colic but now resolved. No UA given but no symptoms and patient keen to go home so can revisit this if symptomatic. All results reviewed with patient.  All questions answered.  Careful return precautions have been given.  Close follow-up advised.   Portions of this record have been created using Scientist, clinical (histocompatibility and immunogenetics). Dictation errors have been sought, but may not have been identified and corrected.    ED Clinical Impression    Final diagnoses:  Abdominal pain, unspecified abdominal location (Primary)   Documentation assistance was provided by Schuyler Gibney, Scribe on May 13, 2024 at 6:56 AM for Rosette Dais, MD. May 13, 2024 8:31 AM. Documentation assistance provided by the above mentioned scribe.  I was present during the time the encounter was recorded. The information recorded by the scribe was done at my direction and has been reviewed and validated by me.   Note has been documented by [facility] A. Grahn on 05/13/2024

## 2024-05-15 ENCOUNTER — Emergency Department
Admission: EM | Admit: 2024-05-15 | Discharge: 2024-05-15 | Disposition: A | Discharge status: Home / Self Care | Source: Home / Self Care | Attending: Emergency Medicine | Admitting: Emergency Medicine

## 2024-05-15 ENCOUNTER — Other Ambulatory Visit: Payer: Self-pay

## 2024-05-15 ENCOUNTER — Encounter: Payer: Self-pay | Admitting: Emergency Medicine

## 2024-05-15 ENCOUNTER — Emergency Department
Admission: EM | Admit: 2024-05-15 | Discharge: 2024-05-15 | Discharge status: Against Medical Advice | Attending: Emergency Medicine | Admitting: Emergency Medicine

## 2024-05-15 DIAGNOSIS — R109 Unspecified abdominal pain: Secondary | ICD-10-CM | POA: Insufficient documentation

## 2024-05-15 DIAGNOSIS — R112 Nausea with vomiting, unspecified: Secondary | ICD-10-CM | POA: Diagnosis not present

## 2024-05-15 DIAGNOSIS — E119 Type 2 diabetes mellitus without complications: Secondary | ICD-10-CM | POA: Diagnosis not present

## 2024-05-15 DIAGNOSIS — D72829 Elevated white blood cell count, unspecified: Secondary | ICD-10-CM | POA: Diagnosis not present

## 2024-05-15 DIAGNOSIS — R1084 Generalized abdominal pain: Secondary | ICD-10-CM | POA: Diagnosis present

## 2024-05-15 DIAGNOSIS — Z5321 Procedure and treatment not carried out due to patient leaving prior to being seen by health care provider: Secondary | ICD-10-CM | POA: Insufficient documentation

## 2024-05-15 LAB — CBC
HCT: 41.7 % (ref 36.0–46.0)
Hemoglobin: 13.6 g/dL (ref 12.0–15.0)
MCH: 25.6 pg — ABNORMAL LOW (ref 26.0–34.0)
MCHC: 32.6 g/dL (ref 30.0–36.0)
MCV: 78.5 fL — ABNORMAL LOW (ref 80.0–100.0)
Platelets: 378 K/uL (ref 150–400)
RBC: 5.31 MIL/uL — ABNORMAL HIGH (ref 3.87–5.11)
RDW: 18.6 % — ABNORMAL HIGH (ref 11.5–15.5)
WBC: 8.8 K/uL (ref 4.0–10.5)
nRBC: 0 % (ref 0.0–0.2)

## 2024-05-15 LAB — CBC WITH DIFFERENTIAL/PLATELET
Abs Immature Granulocytes: 0.03 K/uL (ref 0.00–0.07)
Basophils Absolute: 0 K/uL (ref 0.0–0.1)
Basophils Relative: 0 %
Eosinophils Absolute: 0.1 K/uL (ref 0.0–0.5)
Eosinophils Relative: 1 %
HCT: 40.8 % (ref 36.0–46.0)
Hemoglobin: 13.7 g/dL (ref 12.0–15.0)
Immature Granulocytes: 0 %
Lymphocytes Relative: 25 %
Lymphs Abs: 2.7 K/uL (ref 0.7–4.0)
MCH: 25.8 pg — ABNORMAL LOW (ref 26.0–34.0)
MCHC: 33.6 g/dL (ref 30.0–36.0)
MCV: 77 fL — ABNORMAL LOW (ref 80.0–100.0)
Monocytes Absolute: 1.2 K/uL — ABNORMAL HIGH (ref 0.1–1.0)
Monocytes Relative: 11 %
Neutro Abs: 6.6 K/uL (ref 1.7–7.7)
Neutrophils Relative %: 63 %
Platelets: 383 K/uL (ref 150–400)
RBC: 5.3 MIL/uL — ABNORMAL HIGH (ref 3.87–5.11)
RDW: 18.3 % — ABNORMAL HIGH (ref 11.5–15.5)
WBC: 10.6 K/uL — ABNORMAL HIGH (ref 4.0–10.5)
nRBC: 0 % (ref 0.0–0.2)

## 2024-05-15 LAB — COMPREHENSIVE METABOLIC PANEL WITH GFR
ALT: 25 U/L (ref 0–44)
ALT: 25 U/L (ref 0–44)
AST: 39 U/L (ref 15–41)
AST: 35 U/L (ref 15–41)
Albumin: 4 g/dL (ref 3.5–5.0)
Albumin: 4 g/dL (ref 3.5–5.0)
Alkaline Phosphatase: 68 U/L (ref 38–126)
Alkaline Phosphatase: 68 U/L (ref 38–126)
Anion gap: 14 (ref 5–15)
Anion gap: 14 (ref 5–15)
BUN: 16 mg/dL (ref 6–20)
BUN: 17 mg/dL (ref 6–20)
CO2: 22 mmol/L (ref 22–32)
CO2: 21 mmol/L — ABNORMAL LOW (ref 22–32)
Calcium: 11 mg/dL — ABNORMAL HIGH (ref 8.9–10.3)
Calcium: 10.8 mg/dL — ABNORMAL HIGH (ref 8.9–10.3)
Chloride: 99 mmol/L (ref 98–111)
Chloride: 98 mmol/L (ref 98–111)
Creatinine, Ser: 0.67 mg/dL (ref 0.44–1.00)
Creatinine, Ser: 0.72 mg/dL (ref 0.44–1.00)
GFR, Estimated: >60 mL/min (ref >60)
GFR, Estimated: >60 mL/min (ref >60)
Glucose, Bld: 178 mg/dL — ABNORMAL HIGH (ref 70–99)
Glucose, Bld: 169 mg/dL — ABNORMAL HIGH (ref 70–99)
Potassium: 3.7 mmol/L (ref 3.5–5.1)
Potassium: 3.3 mmol/L — ABNORMAL LOW (ref 3.5–5.1)
Sodium: 135 mmol/L (ref 135–145)
Sodium: 133 mmol/L — ABNORMAL LOW (ref 135–145)
Total Bilirubin: 0.8 mg/dL (ref 0.0–1.2)
Total Bilirubin: 1.2 mg/dL (ref 0.0–1.2)
Total Protein: 7.5 g/dL (ref 6.5–8.1)
Total Protein: 7.4 g/dL (ref 6.5–8.1)

## 2024-05-15 LAB — LIPASE, BLOOD
Lipase: 25 U/L (ref 11–51)
Lipase: 24 U/L (ref 11–51)

## 2024-05-15 NOTE — ED Triage Notes (Addendum)
 Pt was triaged with protocols performed earlier this am, after waiting awhile in the lobby the pt stated that she couldn't wait any longer and would have to leave and do something different, pt returned stating that her pain isn't any better and that she attempted to shower to relieve her pain without success, pt rocking and moaning in triage  Pt states that she hasn't had her bp meds in 2 days

## 2024-05-15 NOTE — ED Notes (Signed)
 See triage note Presents with generalized abd pain  States she was seen earlier  Had some blood work done but left States the last time she vomited was around 10 am   States she doesn't have anything at home for pain

## 2024-05-15 NOTE — ED Provider Notes (Signed)
 [facility] Provider Note    Event Date/Time   First MD Initiated Contact with Patient 05/15/24 1414     (approximate)   History   Abdominal Pain   HPI  Karen Ruiz is a 43 year old female with history of gastroparesis, cannabis use, T2DM presenting to the emergency department for evaluation of abdominal pain.  Reports history of similar episodes in the past, attributes this to her gastroparesis.  Reports vomiting is improved, currently complaining of generalized abdominal pain.  I do see multiple visits related to abdominal pain and vomiting.  Seen at [facility] on 8/13 where workup remained reassuring, head CT performed without acute findings.  Reports receiving Haldol  and Toradol  there without improvement.  Presented here this morning, left before seeing a provider.  Labs reassuring from that time.    Physical Exam   Triage Vital Signs: ED Triage Vitals  Encounter Vitals Group     BP 05/15/24 1244 (!) 192/150     Girls Systolic BP Percentile --      Girls Diastolic BP Percentile --      Boys Systolic BP Percentile --      Boys Diastolic BP Percentile --      Pulse Rate 05/15/24 1244 (!) 111     Resp 05/15/24 1244 18     Temp 05/15/24 1244 98.7 F (37.1 C)     Temp Source 05/15/24 1244 Oral     SpO2 05/15/24 1244 99 %     Weight 05/15/24 1245 88 lb 7.2 oz (40.1 kg)     Height 05/15/24 1245 5' 6 (1.676 m)     Head Circumference --      Peak Flow --      Pain Score 05/15/24 1244 10     Pain Loc --      Pain Education --      Exclude from Growth Chart --     Most recent vital signs: Vitals:   05/15/24 1244 05/15/24 1438  BP: (!) 192/150 (!) 163/120  Pulse: (!) 111 (!) 113  Resp: 18 18  Temp: 98.7 F (37.1 C) 98.6 F (37 C)  SpO2: 99% 97%     General: Awake, interactive  CV:  Tachycardic with regular rhythm Resp:  Unlabored respirations.  Abd:  Nondistended, soft, no significant tenderness to  palpation Neuro:  Symmetric facial movement, fluid speech   ED Results / Procedures / Treatments   Labs (all labs ordered are listed, but only abnormal results are displayed) Labs Reviewed  CBC WITH DIFFERENTIAL/PLATELET - Abnormal; Notable for the following components:      Result Value   WBC 10.6 (*)    RBC 5.30 (*)    MCV 77.0 (*)    MCH 25.8 (*)    RDW 18.3 (*)    Monocytes Absolute 1.2 (*)    All other components within normal limits  COMPREHENSIVE METABOLIC PANEL WITH GFR - Abnormal; Notable for the following components:   Sodium 133 (*)    Potassium 3.3 (*)    CO2 21 (*)    Glucose, Bld 169 (*)    Calcium  10.8 (*)    All other components within normal limits  LIPASE, BLOOD     EKG EKG independently reviewed and interpreted by myself demonstrates:    RADIOLOGY Imaging independently reviewed and interpreted by myself demonstrates:   Formal Radiology Read:  No results found.  PROCEDURES:  Critical Care performed: No  Procedures   MEDICATIONS ORDERED IN ED:  Medications - No data to display   IMPRESSION / MDM / ASSESSMENT AND PLAN / ED COURSE  I reviewed the triage vital signs and the nursing notes.  Differential diagnosis includes, but is not limited to, gastroparesis,, delayed hyperemesis, cyclical vomiting, low suspicion acute intra-abdominal process with reassuring labs and imaging at outside hospital from yesterday  Patient's presentation is most consistent with acute illness / injury with system symptoms.  43 year old female presenting with generalized abdominal pain, nausea with improved vomiting.  Had labs performed earlier this morning which were overall reassuring.  Did have repeat labs performed again from triage which demonstrated mild leukocytosis with WBC of 10.6, normal hemoglobin.  CMP and lipase without critical derangements.  I discussed multiple treatment options with patient including Zofran , Phenergan , Reglan , Haldol , droperidol ,  Toradol , other nonnarcotic options.  Patient is adamant that none of these are effective for her.  I discussed that I am not comfortable providing narcotics for her chronic abdominal pain.  She reports that she wishes to have her IV removed and does not wish to stay for any medications or IV fluids.  She does demonstrate decision-making capacity.  Low suspicion for emergent process.  She was discharged in stable condition.      FINAL CLINICAL IMPRESSION(S) / ED DIAGNOSES   Final diagnoses:  Generalized abdominal pain     Rx / DC Orders   ED Discharge Orders     None        Note:  This document was prepared using Dragon voice recognition software and may include unintentional dictation errors.   Levander Slate, MD 05/15/24 [phone]

## 2024-05-15 NOTE — Discharge Instructions (Addendum)
 You are seen in the ER today for your abdominal pain.  I recommend you follow-up as an outpatient with GI and your primary care team to further discuss your chronic abdominal pain.

## 2024-05-15 NOTE — ED Triage Notes (Signed)
 Pt has been hurting since Monday, states hx of gastroparesis, was seen at [facility] Wednesday and states that the medications didn't help. Pt reports abd pain all over, denies vomiting or diarrhea

## 2024-06-02 NOTE — Progress Notes (Signed)
 Endocrinology Clinic Follow-up Visit Note  ASSESSMENT AND PLAN:   Karen Ruiz is a 43 y.o. 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  Type 2 diabetes mellitus, controlled, with long-term current use of insulin   Lab Results  Component Value Date   A1C 6.3 06/05/2024   A1C 8.7 (H) 01/31/2024   A1C 9.5 (H) 05/07/2023   A1C 12.7 (H) 03/11/2023   A1C 12.1 (H) 09/06/2016   Goal A1c < 7% without hypoglycemia A1c today 6.3% Regimen at last visit: Glargine 40 units, Stopped levemir  11 units AC. Started Ozempic 0.25 mg weekly, planned to increase 0.5 mg after 1 month, Continued Jardiance 25 mg daily.  Patient with significant symptoms with increased dose of Ozempic 1 and 2 mg doses. Currently taking Glargine 40, Jardiance 25 mg daily. Discussed restarting low dose Ozempic with patient for benefit of both Cardiac Disease and T2DM. Patient reports that she tolerated 0.25 and 0.5 mg dose well without significant worsening of gastroparesis symptoms. She is agreeable to do a low dose GLP-1 at this time and with a simultaneous decrease in Glargine.  PLAN Decrease Lantus  to 30 units with adjustment recommendations in AVS. Restart Ozempic at 0.25 mg without adjustments prior to next visit. Discussed with patient to discontinue medication and call us  if she has recurrence of severe symptoms.  Continue Jardiance 25 mg daily. Discussed hypoglycemia plan and treatment. Rule of 15 in patient's AVS. Confirmed patient has glucose tabs.  Patient prescribed glucagon previously.   Diabetes-Related Health Maintenance Hypertension: Coreg 6.25 mg, Losartan  50 mg Hyperlipidemia: Atorvastatin  80 mg Lab Results  Component Value Date   LDL 50 01/31/2024  Retinopathy: no known DR.  Ophthalmologist/optometrist: no current. Referred today. Last visit: several years.  Neuropathy: Foot exam today, no significant neuropathy.   Nephropathy: Alb/Cr ratio 2.9 in 01/2024. On Jardiance, Losartan  50 mg MASH:  FIB-4 Calculation: 0.78 at 05/13/2024  3:35 AM Calculated from: SGOT/AST: 28 U/L at 05/13/2024  3:35 AM SGPT/ALT: 17 U/L at 05/13/2024  3:35 AM Platelets: 391 10*9/L at 05/13/2024  3:35 AM Age: 33 years Autoimmune workup:  TSH: 1.288 Celiac: no sx Other autoimmune diseases: n/a  Patient Instructions  Please refer to the following instructions for insulin : Long-acting: 30 units once daily If your morning blood sugars are < 90 for 7 days in a row, decrease your long-acting insulin  by 2 units If your morning blood sugars are > 130 for 7 days in a row, increase your long-acting insulin  by 2 units Restart Ozempic 0.25 mg weekly. Please contact our office if you experience significant side effects at this dose and stop the medication.  Continue Jardiance 25 mg weekly. For low blood sugars <70 (confirmed by fingerstick), use the Rule of 15 to treat it: give yourself 15 grams of carbs (glucose tablets or 4 oz juice), then check fingerstick blood sugar 15 minutes later. Repeat this until your blood sugar is >70. You can purchase glucose tablets over-the-counter from your local pharmacy. Make sure you have a supply of glucagon at home and with you outside of the home. This is used for emergencies for someone else to give to you when you are too weak or confused to treat a low blood sugar. Please designate someone to give you glucagon and show them where it is. Please call ophthalmology to schedule an appointment with your eye doctor [phone]) [phone]  Return in about 4 months (around 10/05/2024) for In-person.  Patient was staffed with Dr. Arloa.  Tanda MICAEL Mercury, MD PGY-4 Endocrinology, Metabolism and Diabetes Fellow [facility] Endocrinology at [facility]  SUBJECTIVE:   Last visit: Excerpts from last visit: Started on Ozempic 0.25 mg last visit, had uptitrated to 2mg . Had significant abdominal pain on the higher doses that  was not as severe on the lower doses.  Today: Currently taking: Lantus  40 currently, Jardiance 25 mg Bedtime sugars: <160s, when patient is able to check in the morning after increasing dose again, ~100s-140s.  Reports that she had much improved symptoms after stopping Ozempic. Has consistent symptoms of gastroparesis, but was much more severe leading to frequent ED visits while on higher dose of Ozempic. Discussed recommendation of smaller, more frequent meals for gastroparesis.   Exercise: No consistent exercise regimen at this time.   Diet: Has difficulty with consistent diet due to gastroparesis.  Doesn't typically eat until 11-12. Beverages: Does not report sugar containing beverages.   Blood sugar monitoring: Previously ordered Dexcom, however hasn't used due to frequent showers. Using glucometer, but didn't bring glucometer.  Diabetes History: Diabetes Type: Type 2 diabetes  Diagnosed: gestational diabetes in her second pregnancy with subsequent progression to type 2 diabetes in her mid 30s, before her third pregnancy. Family history of DM: Strong family history of diabetes in family, though sounds like they have overweight or obesity. None of her children have diabetes.  C-peptide: not checked  Antibodies: not checked  Hypoglycemia awareness: N/A Complications related to diabetes: none known  Gastroparesis started during first pregnancy around 2006-2007, prior to developing diabetes. Reports losing 100 lbs during this pregnancy. She has seen [facility] and [facility]. No history of pancreatitis.  Medications  Current Medications[1]      OBJECTIVE:   Physical Exam: BP 122/83 (BP Site: L Arm, BP Position: Sitting)   Pulse 64   Ht 167.6 cm (5' 6)   Wt 96.6 kg (213 lb)   BMI 34.38 kg/m   Wt Readings from Last 6 Encounters:  06/05/24 96.6 kg (213 lb)  05/13/24 90.1 kg (198 lb 10.2 oz)  04/17/24 (!) 104.3 kg (230 lb)  03/10/24 (!) 104.2 kg (229 lb 12.8 oz)  02/11/24  (!) 105.3 kg (232 lb 3.2 oz)  01/31/24 (!) 106.2 kg (234 lb 3.2 oz)   General: female in no apparent distress HEENT: EOMI, sclera anictericRespiratory: No increased work of breathing on RA MSK: Foot exam: normal monofilament testing at 8 out of 8 sites bilaterally, no wounds or lesions Neuro: Awake, alert, and oriented Psych: Normal mood and affect Skin: No abnormal skin pigmentation  Labs: I personally reviewed labs available in Epic prior to the start of today's visit.  Lab Results  Component Value Date   A1C 6.3 06/05/2024   A1C 8.7 (H) 01/31/2024   A1C 9.5 (H) 05/07/2023   A1C 12.7 (H) 03/11/2023   A1C 12.1 (H) 09/06/2016   Lab Results  Component Value Date   TRIG 163 (H) 01/31/2024   CHOL 109 01/31/2024   HDL 37 (L) 01/31/2024   LDL 50 01/31/2024   Lab Results  Component Value Date   TSH 1.288 01/31/2024   Lab Results  Component Value Date   CREATININE 0.57 05/13/2024   ALBCRERAT 2.9 02/11/2024   Lab Results  Component Value Date   NA 142 05/13/2024   K 4.1 05/13/2024   CL 102 05/13/2024   CO2 22.5 05/13/2024   BUN 15 05/13/2024   CREATININE 0.57 05/13/2024   AST 28 05/13/2024  PROT 7.7 05/13/2024   ALBUMIN 4.3 05/13/2024   Lab Results  Component Value Date   ALBUMIN 4.3 05/13/2024   ALBUMIN 4.0 04/28/2024   ALBUMIN 3.7 04/17/2024   Imaging: I personally reviewed imaging available in Epic prior to the start of today's visit.  I reviewed and summarized (above) records in preparation for today's visit all pertinent notes in Epic/Media and CareEverywhere as well as any sent records.        [1]  Current Outpatient Medications:  .  acetaminophen  (TYLENOL ) 325 MG tablet, Take 2 tablets (650 mg total) by mouth Every six (6) hours. (Patient not taking: Reported on 02/11/2024), Disp: 30 tablet, Rfl: 0 .  aspirin  81 MG chewable tablet, Chew 1 tablet (81 mg total) daily., Disp: 90 tablet, Rfl: 3 .  atorvastatin  (LIPITOR ) 80 MG tablet, Take 1 tablet by  mouth once daily, Disp: 30 tablet, Rfl: 11 .  blood sugar diagnostic (ACCU-CHEK GUIDE TEST STRIPS) Strp, Test daily before all meals/snacks and once before bedtime., Disp: 300 each, Rfl: 1 .  blood-glucose meter kit, Test daily before all meals/snacks and once before bedtime., Disp: 1 each, Rfl: 0 .  blood-glucose sensor (DEXCOM G7 SENSOR) Devi, Dispense DexCom G7 sensors; Use 1 sensor q 10 days, Disp: 9 each, Rfl: 3 .  carvedilol (COREG) 6.25 MG tablet, Take 1 tablet (6.25 mg total) by mouth two (2) times a day., Disp: 180 tablet, Rfl: 3 .  empagliflozin (JARDIANCE) 25 mg tablet, Take 1 tablet (25 mg total) by mouth every morning., Disp: 90 tablet, Rfl: 3 .  escitalopram  oxalate (LEXAPRO ) 20 MG tablet, Take 1 tablet (20 mg total) by mouth daily., Disp: 90 tablet, Rfl: 2 .  evolocumab  140 mg/mL PnIj, Inject 140 mg under the skin every fourteen (14) days., Disp: 6 mL, Rfl: 3 .  ezetimibe  (ZETIA ) 10 mg tablet, TAKE 1 TABLET(10 MG) BY MOUTH EVERY NIGHT, Disp: 90 tablet, Rfl: 1 .  furosemide (LASIX) 40 MG tablet, Take 1 tablet (40 mg total) by mouth daily as needed for swelling., Disp: 30 tablet, Rfl: 6 .  glucagon spray 3 mg/actuation Spry, Use 1 spray in 1 nostril for severe hypoglycemia, as per package instructions, Disp: 1 each, Rfl: 2 .  insulin  glargine (LANTUS  SOLOSTAR U-100 INSULIN ) 100 unit/mL (3 mL) injection pen, Use up to 50 units per day as per MD instructions, Disp: 45 mL, Rfl: 12 .  lancets (ACCU-CHEK SOFTCLIX LANCETS) Misc, Test daily before all meals/snacks and once before bedtime., Disp: 300 each, Rfl: 1 .  losartan  (COZAAR ) 50 MG tablet, Take 1 tablet (50 mg total) by mouth daily., Disp: 90 tablet, Rfl: 3 .  metoclopramide  (REGLAN ) 10 MG tablet, Take 1 tablet (10 mg total) by mouth Three (3) times a day before meals., Disp: 270 tablet, Rfl: 1 .  nitroglycerin  (NITROSTAT ) 0.4 MG SL tablet, Place 1 tablet (0.4 mg total) under the tongue every five (5) minutes as needed for chest pain.  Maximum of 3 doses in 15 minutes., Disp: 25 tablet, Rfl: 1 .  pen needle, diabetic 32 gauge x 5/32 (4 mm) Ndle, Use with insulin  up to 4 times/day as needed., Disp: 100 each, Rfl: 0 .  semaglutide (OZEMPIC) 0.25 mg or 0.5 mg(2 mg/1.5 mL) PnIj injection, Inject 0.25 mg under the skin every seven (7) days. Start 0.25 mg/week and increase after one month to 0.5 mg/week as directed by MD, Disp: 1.5 mL, Rfl: 5 .  ticagrelor  (BRILINTA ) 90 mg Tab, Take 1 tablet (90 mg  total) by mouth two (2) times a day., Disp: 180 tablet, Rfl: 3

## 2024-06-11 NOTE — Progress Notes (Signed)
I saw and evaluated the patient, participating in the key portions of the service.  I reviewed the resident???s note.  I agree with the resident???s findings and plan. Tomothy Eddins H Ligia Duguay, MD

## 2024-06-29 ENCOUNTER — Other Ambulatory Visit: Payer: Self-pay

## 2024-06-29 ENCOUNTER — Emergency Department
Admission: EM | Admit: 2024-06-29 | Discharge: 2024-06-29 | Disposition: A | Discharge status: Home / Self Care | Attending: Emergency Medicine | Admitting: Emergency Medicine

## 2024-06-29 DIAGNOSIS — R109 Unspecified abdominal pain: Secondary | ICD-10-CM | POA: Diagnosis present

## 2024-06-29 DIAGNOSIS — R1084 Generalized abdominal pain: Secondary | ICD-10-CM | POA: Insufficient documentation

## 2024-06-29 DIAGNOSIS — E1143 Type 2 diabetes mellitus with diabetic autonomic (poly)neuropathy: Secondary | ICD-10-CM | POA: Insufficient documentation

## 2024-06-29 DIAGNOSIS — R1110 Vomiting, unspecified: Secondary | ICD-10-CM | POA: Insufficient documentation

## 2024-06-29 LAB — COMPREHENSIVE METABOLIC PANEL WITH GFR
ALT: 30 U/L (ref 0–44)
AST: 59 U/L — ABNORMAL HIGH (ref 15–41)
Albumin: 3.7 g/dL (ref 3.5–5.0)
Alkaline Phosphatase: 63 U/L (ref 38–126)
Anion gap: 13 (ref 5–15)
BUN: 23 mg/dL — ABNORMAL HIGH (ref 6–20)
CO2: 20 mmol/L — ABNORMAL LOW (ref 22–32)
Calcium: 9.3 mg/dL (ref 8.9–10.3)
Chloride: 103 mmol/L (ref 98–111)
Creatinine, Ser: 0.7 mg/dL (ref 0.44–1.00)
GFR, Estimated: >60 mL/min (ref >60)
Glucose, Bld: 227 mg/dL — ABNORMAL HIGH (ref 70–99)
Potassium: 3.8 mmol/L (ref 3.5–5.1)
Sodium: 136 mmol/L (ref 135–145)
Total Bilirubin: 0.9 mg/dL (ref 0.0–1.2)
Total Protein: 7.3 g/dL (ref 6.5–8.1)

## 2024-06-29 LAB — CBC
HCT: 38.7 % (ref 36.0–46.0)
Hemoglobin: 12.6 g/dL (ref 12.0–15.0)
MCH: 25.4 pg — ABNORMAL LOW (ref 26.0–34.0)
MCHC: 32.6 g/dL (ref 30.0–36.0)
MCV: 78 fL — ABNORMAL LOW (ref 80.0–100.0)
Platelets: 389 K/uL (ref 150–400)
RBC: 4.96 MIL/uL (ref 3.87–5.11)
RDW: 17 % — ABNORMAL HIGH (ref 11.5–15.5)
WBC: 9.8 K/uL (ref 4.0–10.5)
nRBC: 0 % (ref 0.0–0.2)

## 2024-06-29 MED ORDER — PANTOPRAZOLE SODIUM 40 MG IV SOLR
40.0000 mg | Freq: Once | INTRAVENOUS | Status: AC
  Administered 2024-06-29: 40 mg via INTRAVENOUS
  Filled 2024-06-29: qty 10

## 2024-06-29 MED ORDER — DROPERIDOL 2.5 MG/ML IJ SOLN
2.5000 mg | Freq: Once | INTRAMUSCULAR | Status: AC
Start: 2024-06-29 — End: 2024-06-29
  Administered 2024-06-29: 2.5 mg via INTRAVENOUS
  Filled 2024-06-29: qty 2

## 2024-06-29 MED ORDER — SODIUM CHLORIDE 0.9 % IV BOLUS
1000.0000 mL | Freq: Once | INTRAVENOUS | Status: AC
  Administered 2024-06-29: 1000 mL via INTRAVENOUS

## 2024-06-29 MED ORDER — ALUM & MAG HYDROXIDE-SIMETH 200-200-20 MG/5ML PO SUSP
30.0000 mL | Freq: Once | ORAL | Status: AC
  Administered 2024-06-29: 30 mL via ORAL
  Filled 2024-06-29: qty 30

## 2024-06-29 MED ORDER — METOCLOPRAMIDE HCL 5 MG/ML IJ SOLN
10.0000 mg | Freq: Once | INTRAMUSCULAR | Status: AC
  Administered 2024-06-29: 10 mg via INTRAVENOUS
  Filled 2024-06-29: qty 2

## 2024-06-29 MED ORDER — KETOROLAC TROMETHAMINE 15 MG/ML IJ SOLN
15.0000 mg | Freq: Once | INTRAMUSCULAR | Status: AC
  Administered 2024-06-29: 15 mg via INTRAVENOUS
  Filled 2024-06-29: qty 1

## 2024-06-29 NOTE — Discharge Instructions (Signed)
 You were seen in the Emergency Department today for evaluation of your abdominal pain. Fortunately, your exam and labs were reassuring against an emergency cause for your pain. Please follow-up with your primary care doctor within the next few days for reevaluation. Return to the ER for any new or worsening symptoms including worsening pain, inability to tolerate food or liquids, or any other new or concerning symptoms.

## 2024-06-29 NOTE — ED Provider Notes (Signed)
 [facility] Provider Note    Event Date/Time   First MD Initiated Contact with Patient 06/29/24 [phone]     (approximate)   History   Abdominal Pain   HPI  Karen Ruiz is a 43 year old female with history of gastroparesis, cannabis use, T2DM presenting to the emergency department for evaluation abdominal and vomiting.  Reports a few days ago she had onset of symptoms.  Reports this is consistent with episodes she has had multiple times in the past.  Denies any change in character.  Do note multiple recent ER visits including being seen by myself in August, also seen in [facility] ER on 8/13, received Haldol  and Toradol  at that time.     Physical Exam   Triage Vital Signs: ED Triage Vitals  Encounter Vitals Group     BP 06/29/24 0839 (!) 157/129     Girls Systolic BP Percentile --      Girls Diastolic BP Percentile --      Boys Systolic BP Percentile --      Boys Diastolic BP Percentile --      Pulse Rate 06/29/24 0839 (!) 110     Resp 06/29/24 0839 18     Temp 06/29/24 0839 98.1 F (36.7 C)     Temp src --      SpO2 06/29/24 0839 95 %     Weight 06/29/24 0838 195 lb (88.5 kg)     Height 06/29/24 0838 5' 6 (1.676 m)     Head Circumference --      Peak Flow --      Pain Score 06/29/24 0838 10     Pain Loc --      Pain Education --      Exclude from Growth Chart --     Most recent vital signs: Vitals:   06/29/24 0900 06/29/24 0930  BP: (!) 137/93 (!) 171/94  Pulse: (!) 115 96  Resp: (!) 25 20  Temp:    SpO2: 96% 98%     General: Awake, interactive  CV:  Good peripheral perfusion.  Resp:  Unlabored respirations.  Abd:  Nondistended, soft, no appreciable tenderness to palpation  Neuro:  Symmetric facial movement, fluid speech   ED Results / Procedures / Treatments   Labs (all labs ordered are listed, but only abnormal results are displayed) Labs Reviewed  COMPREHENSIVE METABOLIC PANEL WITH GFR - Abnormal; Notable for the  following components:      Result Value   CO2 20 (*)    Glucose, Bld 227 (*)    BUN 23 (*)    AST 59 (*)    All other components within normal limits  CBC - Abnormal; Notable for the following components:   MCV 78.0 (*)    MCH 25.4 (*)    RDW 17.0 (*)    All other components within normal limits  URINALYSIS, ROUTINE W REFLEX MICROSCOPIC  POC URINE PREG, ED     EKG EKG independently reviewed and interpreted by myself demonstrates:    RADIOLOGY Imaging independently reviewed and interpreted by myself demonstrates:   Formal Radiology Read:  No results found.  PROCEDURES:  Critical Care performed: No  Procedures   MEDICATIONS ORDERED IN ED: Medications  droperidol  (INAPSINE ) 2.5 MG/ML injection 2.5 mg (2.5 mg Intravenous Given 06/29/24 0913)  sodium chloride  0.9 % bolus 1,000 mL (0 mLs Intravenous Stopped 06/29/24 1115)  ketorolac  (TORADOL ) 15 MG/ML injection 15 mg (15 mg Intravenous Given 06/29/24 1108)  metoCLOPramide  (  REGLAN ) injection 10 mg (10 mg Intravenous Given 06/29/24 1109)  alum & mag hydroxide-simeth (MAALOX/MYLANTA) 200-200-20 MG/5ML suspension 30 mL (30 mLs Oral Given 06/29/24 1108)  pantoprazole  (PROTONIX ) injection 40 mg (40 mg Intravenous Given 06/29/24 1108)     IMPRESSION / MDM / ASSESSMENT AND PLAN / ED COURSE  I reviewed the triage vital signs and the nursing notes.  Differential diagnosis includes, but is not limited to, sequela from gastroparesis related to diabetes, cannabinoid hyperemesis, lower suspicion acute intra-abdominal process given history of multiple similar episodes with unremarkable CT scans, reassuring abdominal exam without focal tenderness on exam  Patient's presentation is most consistent with acute presentation with potential threat to life or bodily function.  43 year old female presenting with vomiting and abdominal pain.  Initially tachycardic, improved after IV fluids.  Labs without critical derangements.  Mild hyperglycemia  noted, but normal anion gap not consistent with DKA.  Will trial symptomatic treatment and reassessment.  Clinical Course as of 06/29/24 1128  Mon Jun 29, 2024  1010 Patient reassessed.  When I walked in the room she was resting in bed watching TV.  Actively reports continued pain but she does clinically look more comfortable.  Will trial Toradol  and Reglan  to see if this improves her symptoms. [NR]  1127 Patient reassessed.  Continues to rest comfortably in bed.  Has been able to tolerate some p.o. intake without vomiting here.  Do think patient is stable for discharge.  Discussed continued supportive care at home.  Reports she does have adequate amounts of her home nausea medication available.  Strict return precautions provided.  Patient discharged stable condition. [NR]    Clinical Course User Index [NR] Levander Slate, MD     FINAL CLINICAL IMPRESSION(S) / ED DIAGNOSES   Final diagnoses:  Generalized abdominal pain     Rx / DC Orders   ED Discharge Orders     None        Note:  This document was prepared using Dragon voice recognition software and may include unintentional dictation errors.   Levander Slate, MD 06/29/24 1128

## 2024-06-29 NOTE — ED Triage Notes (Signed)
 Pt comes with 3 days N/V and belly pain. Pt unable to sit still in triage. Pt moaning in pain. Pt states hx of gastroparesis

## 2024-10-31 ENCOUNTER — Emergency Department
Admission: EM | Admit: 2024-10-31 | Discharge: 2024-10-31 | Disposition: A | Discharge status: Home / Self Care | Attending: Emergency Medicine | Admitting: Emergency Medicine

## 2024-10-31 ENCOUNTER — Other Ambulatory Visit: Payer: Self-pay

## 2024-10-31 DIAGNOSIS — E119 Type 2 diabetes mellitus without complications: Secondary | ICD-10-CM | POA: Insufficient documentation

## 2024-10-31 DIAGNOSIS — R1084 Generalized abdominal pain: Secondary | ICD-10-CM | POA: Insufficient documentation

## 2024-10-31 DIAGNOSIS — D72829 Elevated white blood cell count, unspecified: Secondary | ICD-10-CM | POA: Insufficient documentation

## 2024-10-31 HISTORY — DX: Cyclical vomiting syndrome unrelated to migraine: R11.15

## 2024-10-31 LAB — COMPREHENSIVE METABOLIC PANEL WITH GFR
ALT: 17 U/L (ref 0–44)
AST: 42 U/L — ABNORMAL HIGH (ref 15–41)
Albumin: 4.5 g/dL (ref 3.5–5.0)
Alkaline Phosphatase: 108 U/L (ref 38–126)
Anion gap: 18 — ABNORMAL HIGH (ref 5–15)
BUN: 24 mg/dL — ABNORMAL HIGH (ref 6–20)
CO2: 17 mmol/L — ABNORMAL LOW (ref 22–32)
Calcium: 10 mg/dL (ref 8.9–10.3)
Chloride: 100 mmol/L (ref 98–111)
Creatinine, Ser: 0.84 mg/dL (ref 0.44–1.00)
GFR, Estimated: >60 mL/min
Glucose, Bld: 237 mg/dL — ABNORMAL HIGH (ref 70–99)
Potassium: 6.1 mmol/L — ABNORMAL HIGH (ref 3.5–5.1)
Sodium: 135 mmol/L (ref 135–145)
Total Bilirubin: 0.6 mg/dL (ref 0.0–1.2)
Total Protein: 8.1 g/dL (ref 6.5–8.1)

## 2024-10-31 LAB — CBC
HCT: 44.5 % (ref 36.0–46.0)
Hemoglobin: 14.3 g/dL (ref 12.0–15.0)
MCH: 25.3 pg — ABNORMAL LOW (ref 26.0–34.0)
MCHC: 32.1 g/dL (ref 30.0–36.0)
MCV: 78.6 fL — ABNORMAL LOW (ref 80.0–100.0)
Platelets: 429 10*3/uL — ABNORMAL HIGH (ref 150–400)
RBC: 5.66 MIL/uL — ABNORMAL HIGH (ref 3.87–5.11)
RDW: 16.4 % — ABNORMAL HIGH (ref 11.5–15.5)
WBC: 10.6 10*3/uL — ABNORMAL HIGH (ref 4.0–10.5)
nRBC: 0 % (ref 0.0–0.2)

## 2024-10-31 LAB — URINALYSIS, ROUTINE W REFLEX MICROSCOPIC
Bacteria, UA: NONE SEEN
Bilirubin Urine: NEGATIVE
Glucose, UA: >=500 mg/dL — AB
Hgb urine dipstick: NEGATIVE
Ketones, ur: 20 mg/dL — AB
Nitrite: NEGATIVE
Protein, ur: NEGATIVE mg/dL
Specific Gravity, Urine: 1.039 — ABNORMAL HIGH (ref 1.005–1.030)
pH: 6 (ref 5.0–8.0)

## 2024-10-31 LAB — BASIC METABOLIC PANEL WITH GFR
Anion gap: 15 (ref 5–15)
BUN: 22 mg/dL — ABNORMAL HIGH (ref 6–20)
CO2: 19 mmol/L — ABNORMAL LOW (ref 22–32)
Calcium: 9.5 mg/dL (ref 8.9–10.3)
Chloride: 104 mmol/L (ref 98–111)
Creatinine, Ser: 0.66 mg/dL (ref 0.44–1.00)
GFR, Estimated: >60 mL/min
Glucose, Bld: 146 mg/dL — ABNORMAL HIGH (ref 70–99)
Potassium: 4.4 mmol/L (ref 3.5–5.1)
Sodium: 138 mmol/L (ref 135–145)

## 2024-10-31 LAB — POC URINE PREG, ED: Preg Test, Ur: NEGATIVE

## 2024-10-31 LAB — TROPONIN T, HIGH SENSITIVITY
Troponin T High Sensitivity: 10 ng/L (ref 0–19)
Troponin T High Sensitivity: 6 ng/L (ref 0–19)

## 2024-10-31 LAB — LIPASE, BLOOD: Lipase: 18 U/L (ref 11–51)

## 2024-10-31 MED ORDER — DROPERIDOL 2.5 MG/ML IJ SOLN
2.5000 mg | Freq: Once | INTRAMUSCULAR | Status: AC
  Administered 2024-10-31: 2.5 mg via INTRAVENOUS
  Filled 2024-10-31: qty 2

## 2024-10-31 MED ORDER — ONDANSETRON 4 MG PO TBDP: 4.0000 mg | ORAL_TABLET | Freq: Three times a day (TID) | ORAL | 0 refills | Status: AC | PRN

## 2024-10-31 MED ORDER — SODIUM CHLORIDE 0.9 % IV BOLUS
1000.0000 mL | Freq: Once | INTRAVENOUS | Status: AC
  Administered 2024-10-31: 1000 mL via INTRAVENOUS

## 2024-10-31 MED ORDER — PANTOPRAZOLE SODIUM 40 MG IV SOLR
40.0000 mg | Freq: Once | INTRAVENOUS | Status: AC
  Administered 2024-10-31: 40 mg via INTRAVENOUS
  Filled 2024-10-31: qty 10

## 2024-10-31 NOTE — Discharge Instructions (Addendum)
 You did look slightly dehydrated and we have given you some fluids and medications to help with your pain.  You should return to the ER if you develop worsening symptoms, recurrent pain or vomiting as we can always do a repeat evaluation but otherwise you can follow-up with your primary care doctor I prescribe some Zofran  to help with nausea.

## 2024-10-31 NOTE — ED Provider Notes (Signed)
 "  [facility] Provider Note    Event Date/Time   First MD Initiated Contact with Patient 10/31/24 [phone]     (approximate)   History   Abdominal Pain   HPI  Karen Ruiz is a 44 y.o. female  gastroparesis, cannabis use, T2DM who comes in with concerns for abdominal pain and vomiting.  Patient was last seen on 06/29/2024, patient was treated with droperidol .  Patient reports having abdominal pain for the past 2 days.  Does not take anything at home to try to help with the pain.  She reports having 2 episodes of vomiting.  No diarrhea. She reports that this feels similar to when she has had her gastroparesis issues previously but states that this has been worse.  She denies any alcohol, marijuana use, drug use or other concerns.  I also reviewed where patient underwent the MRI abdomen on 08/23/2024  IMPRESSION:  1. Previously noted lesion in the posterior superior right hepatic lobe has imaging characteristics consistent with a benign hepatic hemangioma. This requires no follow-up.  2.  Cholelithiasis with no evidence of acute cholecystitis.   Physical Exam   Triage Vital Signs: ED Triage Vitals  Encounter Vitals Group     BP 10/31/24 0900 (!) 184/138     Girls Systolic BP Percentile --      Girls Diastolic BP Percentile --      Boys Systolic BP Percentile --      Boys Diastolic BP Percentile --      Pulse Rate 10/31/24 0900 (!) 131     Resp 10/31/24 0900 (!) 24     Temp --      Temp src --      SpO2 10/31/24 0900 97 %     Weight --      Height --      Head Circumference --      Peak Flow --      Pain Score 10/31/24 0856 10     Pain Loc --      Pain Education --      Exclude from Growth Chart --     Most recent vital signs: Vitals:   10/31/24 0900  BP: (!) 184/138  Pulse: (!) 131  Resp: (!) 24  SpO2: 97%     General: Patient is moving all around, not sitting in the bed, patient actively moaning. CV:  Good peripheral perfusion.   Resp:  Normal effort.  Abd:  No distention.  She reports tenderness but there is no rebound, no guarding. Other:     ED Results / Procedures / Treatments   Labs (all labs ordered are listed, but only abnormal results are displayed) Labs Reviewed  LIPASE, BLOOD  COMPREHENSIVE METABOLIC PANEL WITH GFR  CBC  URINALYSIS, ROUTINE W REFLEX MICROSCOPIC  POC URINE PREG, ED  TROPONIN T, HIGH SENSITIVITY     EKG  My interpretation of EKG:  Sinus tachycardia rate of 132 without any ST elevation or T wave inversions.  Difficult to assess intervals but QTc appears maybe to be 450  RADIOLOGY None    PROCEDURES:  Critical Care performed: No  Procedures   MEDICATIONS ORDERED IN ED: Medications  sodium chloride  0.9 % bolus 1,000 mL (0 mLs Intravenous Stopped 10/31/24 1136)  pantoprazole  (PROTONIX ) injection 40 mg (40 mg Intravenous Given 10/31/24 0937)  droperidol  (INAPSINE ) 2.5 MG/ML injection 2.5 mg (2.5 mg Intravenous Given 10/31/24 0941)  sodium chloride  0.9 % bolus 1,000 mL (1,000 mLs Intravenous  New Bag/Given 10/31/24 1222)     IMPRESSION / MDM / ASSESSMENT AND PLAN / ED COURSE  I reviewed the triage vital signs and the nursing notes.   Patient's presentation is most consistent with acute presentation with potential threat to life or bodily function.   Patient has a history of chronic abdominal pain coming in with consistent chronic abdominal pain nausea however patient does have a history of diabetes so we will check labs to evaluate for dehydration, DKA and reassess placement.  Will give some droperidol  to help with pain and, Protonix , fluids   11:50 AM pt resting comfortable in bed.  Her abdominal exam is soft and nontender she reports feeling much improved.  Discussed CT imaging with patient although she has had multiple CTs in the past therefore given resolution of symptoms patient okay with holding off on CT imaging as this feels similar to when she has been admitted  previously.  Last CT imaging in May 2025 was overall reassuring except for recommended nonemergent hepatic ultrasound for possible hemangioma  12:56 PM patient's blood work shows a elevated potassium level but there was hemolysis.  Her sugars were elevated with slight bicarb decrease and slight anion gap seems less likely related to DKA more likely related to dehydration therefore we did send another BMP but this was only after 1 L of fluid.  After 1 L of fluid patient's bicarb is already increased to 19 anion gap has normalized and her potassium is also normal at 4.4 without any intervention so I do suspect that the first 1 was more likely hemolysis.  There is no evidence of heart attack.  Her lipase was reassuring.  White count was slightly elevated at 10 but suspect more likely reactive given resolution of abdominal pain.  2:18 PM Patient is urine is negative for pregnancy.  Repeat BMP not consistent with DKA.  Patient on reassessment continues to to be soft and nontender.  Her urine without evidence of UTI given significant amount of squamous cells.  She is tolerating p.o. and feels comfortable with discharge home.  Patient was initially hypertensive but upon patient's treatments her heart blood pressures have come down but she can follow this up with her primary care doctor    FINAL CLINICAL IMPRESSION(S) / ED DIAGNOSES   Final diagnoses:  Generalized abdominal pain     Rx / DC Orders   ED Discharge Orders          Ordered    ondansetron  (ZOFRAN -ODT) 4 MG disintegrating tablet  Every 8 hours PRN        10/31/24 1419             Note:  This document was prepared using Dragon voice recognition software and may include unintentional dictation errors.   Ernest Ronal BRAVO, MD 10/31/24 1421  "

## 2024-10-31 NOTE — ED Notes (Signed)
 Pt is thrashing around. Unable to get bloodwork at this time. Pt is unconsolable. First nurse alerted for room. Pt refuses wheelchair.

## 2024-10-31 NOTE — ED Triage Notes (Signed)
 Pt to ED for generalized abdominal pain since yesterday. Hx cyclic vomiting syndrome, DM, gastroparesis and STEMI. Pt moaning and crying and rocking. Pt denies vomiting.  Attempting to get EKG (moving a lot). Denies recent marijuana use.
